# Patient Record
Sex: Female | Born: 1975 | Race: White | Hispanic: No | Marital: Married | Smoking: Former smoker
Health system: Southern US, Community
[De-identification: ages and names within clinical notes are randomized; demographics above are authoritative.]

## PROBLEM LIST (undated history)

## (undated) DIAGNOSIS — M2550 Pain in unspecified joint: Secondary | ICD-10-CM

## (undated) DIAGNOSIS — Z8489 Family history of other specified conditions: Secondary | ICD-10-CM

## (undated) DIAGNOSIS — M25474 Effusion, right foot: Secondary | ICD-10-CM

## (undated) DIAGNOSIS — E781 Pure hyperglyceridemia: Secondary | ICD-10-CM

## (undated) DIAGNOSIS — G8929 Other chronic pain: Secondary | ICD-10-CM

## (undated) DIAGNOSIS — F32A Depression, unspecified: Secondary | ICD-10-CM

## (undated) DIAGNOSIS — G4700 Insomnia, unspecified: Secondary | ICD-10-CM

## (undated) DIAGNOSIS — E538 Deficiency of other specified B group vitamins: Secondary | ICD-10-CM

## (undated) DIAGNOSIS — R7303 Prediabetes: Secondary | ICD-10-CM

## (undated) DIAGNOSIS — M533 Sacrococcygeal disorders, not elsewhere classified: Secondary | ICD-10-CM

## (undated) DIAGNOSIS — K829 Disease of gallbladder, unspecified: Secondary | ICD-10-CM

## (undated) DIAGNOSIS — F419 Anxiety disorder, unspecified: Secondary | ICD-10-CM

## (undated) DIAGNOSIS — E282 Polycystic ovarian syndrome: Secondary | ICD-10-CM

## (undated) DIAGNOSIS — F988 Other specified behavioral and emotional disorders with onset usually occurring in childhood and adolescence: Secondary | ICD-10-CM

## (undated) DIAGNOSIS — R600 Localized edema: Secondary | ICD-10-CM

## (undated) DIAGNOSIS — L709 Acne, unspecified: Secondary | ICD-10-CM

## (undated) DIAGNOSIS — K635 Polyp of colon: Secondary | ICD-10-CM

## (undated) DIAGNOSIS — R002 Palpitations: Secondary | ICD-10-CM

## (undated) DIAGNOSIS — M549 Dorsalgia, unspecified: Secondary | ICD-10-CM

## (undated) DIAGNOSIS — R0602 Shortness of breath: Secondary | ICD-10-CM

## (undated) DIAGNOSIS — G709 Myoneural disorder, unspecified: Secondary | ICD-10-CM

## (undated) DIAGNOSIS — I10 Essential (primary) hypertension: Secondary | ICD-10-CM

## (undated) DIAGNOSIS — M25475 Effusion, left foot: Secondary | ICD-10-CM

## (undated) DIAGNOSIS — M25472 Effusion, left ankle: Secondary | ICD-10-CM

## (undated) DIAGNOSIS — M25471 Effusion, right ankle: Secondary | ICD-10-CM

## (undated) DIAGNOSIS — E559 Vitamin D deficiency, unspecified: Secondary | ICD-10-CM

## (undated) DIAGNOSIS — K5900 Constipation, unspecified: Secondary | ICD-10-CM

## (undated) DIAGNOSIS — K219 Gastro-esophageal reflux disease without esophagitis: Secondary | ICD-10-CM

## (undated) DIAGNOSIS — E785 Hyperlipidemia, unspecified: Secondary | ICD-10-CM

## (undated) DIAGNOSIS — R5383 Other fatigue: Secondary | ICD-10-CM

## (undated) DIAGNOSIS — D649 Anemia, unspecified: Secondary | ICD-10-CM

## (undated) HISTORY — DX: Anxiety disorder, unspecified: F41.9

## (undated) HISTORY — DX: Pain in unspecified joint: M25.50

## (undated) HISTORY — DX: Vitamin D deficiency, unspecified: E55.9

## (undated) HISTORY — DX: Localized edema: R60.0

## (undated) HISTORY — DX: Deficiency of other specified B group vitamins: E53.8

## (undated) HISTORY — DX: Insomnia, unspecified: G47.00

## (undated) HISTORY — PX: CHOLECYSTECTOMY: SHX55

## (undated) HISTORY — PX: EYE SURGERY: SHX253

## (undated) HISTORY — DX: Dorsalgia, unspecified: M54.9

## (undated) HISTORY — DX: Effusion, right foot: M25.474

## (undated) HISTORY — DX: Hyperlipidemia, unspecified: E78.5

## (undated) HISTORY — DX: Effusion, right ankle: M25.471

## (undated) HISTORY — DX: Prediabetes: R73.03

## (undated) HISTORY — DX: Effusion, left ankle: M25.472

## (undated) HISTORY — DX: Constipation, unspecified: K59.00

## (undated) HISTORY — DX: Other fatigue: R53.83

## (undated) HISTORY — DX: Other specified behavioral and emotional disorders with onset usually occurring in childhood and adolescence: F98.8

## (undated) HISTORY — DX: Effusion, left foot: M25.475

## (undated) HISTORY — DX: Polyp of colon: K63.5

## (undated) HISTORY — DX: Pure hyperglyceridemia: E78.1

## (undated) HISTORY — DX: Polycystic ovarian syndrome: E28.2

## (undated) HISTORY — DX: Depression, unspecified: F32.A

## (undated) HISTORY — DX: Shortness of breath: R06.02

## (undated) HISTORY — DX: Palpitations: R00.2

## (undated) HISTORY — PX: APPENDECTOMY: SHX54

## (undated) HISTORY — DX: Disease of gallbladder, unspecified: K82.9

## (undated) HISTORY — PX: DILATION AND CURETTAGE OF UTERUS: SHX78

## (undated) HISTORY — DX: Sacrococcygeal disorders, not elsewhere classified: M53.3

## (undated) HISTORY — DX: Other chronic pain: G89.29

## (undated) HISTORY — DX: Acne, unspecified: L70.9

---

## 2005-05-05 ENCOUNTER — Inpatient Hospital Stay: Payer: Self-pay | Admitting: Vascular Surgery

## 2008-10-31 ENCOUNTER — Ambulatory Visit: Payer: Self-pay | Admitting: Family Medicine

## 2008-10-31 DIAGNOSIS — R002 Palpitations: Secondary | ICD-10-CM | POA: Insufficient documentation

## 2008-11-03 ENCOUNTER — Telehealth (INDEPENDENT_AMBULATORY_CARE_PROVIDER_SITE_OTHER): Payer: Self-pay | Admitting: *Deleted

## 2008-11-11 ENCOUNTER — Ambulatory Visit: Payer: Self-pay | Admitting: Family Medicine

## 2008-11-11 LAB — CONVERTED CEMR LAB
BUN: 12 mg/dL (ref 6–23)
Chloride: 104 meq/L (ref 96–112)
Creatinine, Ser: 0.8 mg/dL (ref 0.4–1.2)
GFR calc Af Amer: 106 mL/min
GFR calc non Af Amer: 88 mL/min
Potassium: 3.9 meq/L (ref 3.5–5.1)

## 2008-12-05 ENCOUNTER — Ambulatory Visit: Payer: Self-pay | Admitting: Cardiology

## 2008-12-05 ENCOUNTER — Encounter: Payer: Self-pay | Admitting: Family Medicine

## 2008-12-05 LAB — CONVERTED CEMR LAB: TSH: 2.832 microintl units/mL (ref 0.350–4.50)

## 2010-06-14 ENCOUNTER — Ambulatory Visit: Payer: Self-pay | Admitting: Unknown Physician Specialty

## 2010-06-15 ENCOUNTER — Ambulatory Visit: Payer: Self-pay | Admitting: Unknown Physician Specialty

## 2010-07-23 ENCOUNTER — Ambulatory Visit: Payer: Self-pay | Admitting: Unknown Physician Specialty

## 2010-07-23 HISTORY — PX: HYSTEROSALPINGOGRAM: SHX6581

## 2011-02-04 ENCOUNTER — Encounter: Payer: Self-pay | Admitting: Family Medicine

## 2011-02-05 ENCOUNTER — Encounter: Payer: Self-pay | Admitting: Family Medicine

## 2011-02-05 ENCOUNTER — Ambulatory Visit (INDEPENDENT_AMBULATORY_CARE_PROVIDER_SITE_OTHER): Payer: Managed Care, Other (non HMO) | Admitting: Family Medicine

## 2011-02-05 DIAGNOSIS — I10 Essential (primary) hypertension: Secondary | ICD-10-CM

## 2011-02-05 DIAGNOSIS — E282 Polycystic ovarian syndrome: Secondary | ICD-10-CM | POA: Insufficient documentation

## 2011-02-05 HISTORY — DX: Essential (primary) hypertension: I10

## 2011-02-05 LAB — CONVERTED CEMR LAB
Bilirubin Urine: NEGATIVE
Ketones, urine, test strip: NEGATIVE
Nitrite: NEGATIVE
Specific Gravity, Urine: 1.015
Urobilinogen, UA: 0.2

## 2011-02-13 NOTE — Assessment & Plan Note (Signed)
Summary: CHECK BP/CLE   [employer]   Vital Signs:  Patient profile:   35 year old female Height:      65 inches Weight:      241.0 pounds BMI:     40.25 Temp:     98.8 degrees F oral Pulse rate:   88 / minute Pulse rhythm:   regular BP sitting:   140 / 100  (left arm) Cuff size:   large  Vitals Entered By: Benny Lennert CMA Duncan Dull) (February 05, 2011 8:49 AM)  History of Present Illness: Chief complaint check bp   HTN, new diagnosis: elevated at dentist...175/90 At endocrinologist's office... 155/90 Foogy feeling in afternoons, no diziness, no headache.  Seeing ENDO for PCOS... blood work yesterday...cheking glucose, A1C... not sure what else. Last cholesterol check few years ago. No recent thyroid test.  Will get labs from ENDO to not repeat testing.    Strong family history of HTN. BMI >30.  No exercise. Diet: eating poorly, fast food.     Problems Prior to Update: 1)  Essential Hypertension, Benign  (ICD-401.1) 2)  Polycystic Ovarian Disease  (ICD-256.4) 3)  Screening For Diabetes Mellitus  (ICD-V77.1) 4)  Palpitations, Recurrent  (ICD-785.1)  Current Medications (verified): 1)  Doxycycline Hyclate 100 Mg Cpep (Doxycycline Hyclate) .... Take One Capsule By Mouth Once Daily 2)  Retina Micro .... Use Two Times A Day  Allergies (verified): No Known Drug Allergies  Past History:  Past medical, surgical, family and social histories (including risk factors) reviewed, and no changes noted (except as noted below).  Past Medical History: Reviewed history from 10/31/2008 and no changes required. Acne  Past Surgical History: Reviewed history from 10/31/2008 and no changes required. Appendectomy, 2005  Family History: Reviewed history from 10/31/2008 and no changes required. Family History Hypertension  CVA, GP  Social History: Reviewed history from 10/31/2008 and no changes required. Marital Status: Married Occupation: in Audiological scientist Current Smoker Drug  use-no  Review of Systems General:  Denies fatigue. CV:  Denies chest pain or discomfort and swelling of feet. Resp:  Denies shortness of breath. GI:  Denies abdominal pain. Psych:  Denies anxiety, depression, and panic attacks.  Physical Exam  General:  obese appearing female inNAD  Eyes:  No corneal or conjunctival inflammation noted. EOMI. Perrla. Funduscopic exam benign, without hemorrhages, exudates or papilledema. Vision grossly normal. Ears:  External ear exam shows no significant lesions or deformities.  Otoscopic examination reveals clear canals, tympanic membranes are intact bilaterally without bulging, retraction, inflammation or discharge. Hearing is grossly normal bilaterally. Nose:  External nasal examination shows no deformity or inflammation. Nasal mucosa are pink and moist without lesions or exudates. Mouth:  MMM Neck:  no carotid bruit or thyromegaly no cervical or supraclavicular lymphadenopathy  Lungs:  Normal respiratory effort, chest expands symmetrically. Lungs are clear to auscultation, no crackles or wheezes. Heart:  Normal rate and regular rhythm. S1 and S2 normal without gallop, murmur, click, rub or other extra sounds. Pulses:  R and L posterior tibial pulses are full and equal bilaterally  Extremities:  no edema Neurologic:  No cranial nerve deficits noted. Station and gait are normal. Plantar reflexes are down-going bilaterally. DTRs are symmetrical throughout. Sensory, motor and coordinative functions appear intact. Skin:  Intact without suspicious lesions or rashes   Impression & Recommendations:  Problem # 1:  ESSENTIAL HYPERTENSION, BENIGN (ICD-401.1) Assessment New Eval for end oragn damage, secondary causes and risk factors. Get recetn labs from ENDO to determine what was done.  Info given on lifestyle change. Keep BP diary at home. Start HCTZ 25 mg daily.  Follow up in 2 weeks.  Her updated medication list for this problem includes:     Hydrochlorothiazide 25 Mg Tabs (Hydrochlorothiazide) .Marland Kitchen... 1 tab by mouth daily  Orders: UA Dipstick w/o Micro (manual) (16109): no abnormality EKG w/ Interpretation (93000) NSR, no ST changes, no Q, no LVH  Problem # 2:  POLYCYSTIC OVARIAN DISEASE (ICD-256.4) Encouraged exercise, weight loss, healthy eating habits.  Get records from ENDO.   Complete Medication List: 1)  Doxycycline Hyclate 100 Mg Cpep (Doxycycline hyclate) .... Take one capsule by mouth once daily 2)  Retina Micro  .... Use two times a day 3)  Hydrochlorothiazide 25 Mg Tabs (Hydrochlorothiazide) .Marland Kitchen.. 1 tab by mouth daily  Patient Instructions: 1)  Work on weight loss.  2)  Start regular exercsie.  3)  Work on low salt, low fat, lab carb  heart healthy diet. 4)   Fresh fruit, veggies, lean protein, increase fiber. 5)  Start HCTZ daily in AMs. 6)   Check BPs at home daily... goal <140/90. 7)   Call if BP not at goal on new med.  8)  Follow up in 2 weeks for BP check.  9)    Prescriptions: HYDROCHLOROTHIAZIDE 25 MG TABS (HYDROCHLOROTHIAZIDE) 1 tab by mouth daily  #30 x 11   Entered and Authorized by:   Kerby Nora MD   Signed by:   Kerby Nora MD on 02/05/2011   Method used:   Electronically to        [facility]       [address]       [place], [place]  [address]       Ph: [phone]       Fax: [phone]   RxID:   [id number]    Orders Added: 1)  UA Dipstick w/o Micro (manual) [81002] 2)  EKG w/ Interpretation [93000] 3)  Est. Patient Level IV [44010]    Current Allergies (reviewed today): No known allergies   Laboratory Results   Urine Tests  Date/Time Received: February 05, 2011 9:41 AM  Date/Time Reported: February 05, 2011 9:41 AM   Routine Urinalysis   Color: yellow Appearance: Clear Glucose: negative   (Normal Range: Negative) Bilirubin: negative   (Normal Range: Negative) Ketone: negative   (Normal Range:  Negative) Spec. Gravity: 1.015   (Normal Range: 1.003-1.035) Blood: negative   (Normal Range: Negative) pH: 5.5   (Normal Range: 5.0-8.0) Protein: negative   (Normal Range: Negative) Urobilinogen: 0.2   (Normal Range: 0-1) Nitrite: negative   (Normal Range: Negative) Leukocyte Esterace: negative   (Normal Range: Negative)

## 2011-02-19 ENCOUNTER — Ambulatory Visit (INDEPENDENT_AMBULATORY_CARE_PROVIDER_SITE_OTHER): Payer: Managed Care, Other (non HMO) | Admitting: Family Medicine

## 2011-02-19 ENCOUNTER — Encounter: Payer: Self-pay | Admitting: Family Medicine

## 2011-02-19 DIAGNOSIS — I10 Essential (primary) hypertension: Secondary | ICD-10-CM

## 2011-02-25 ENCOUNTER — Other Ambulatory Visit: Payer: Self-pay | Admitting: Family Medicine

## 2011-02-25 ENCOUNTER — Encounter (INDEPENDENT_AMBULATORY_CARE_PROVIDER_SITE_OTHER): Payer: Self-pay | Admitting: *Deleted

## 2011-02-25 ENCOUNTER — Other Ambulatory Visit (INDEPENDENT_AMBULATORY_CARE_PROVIDER_SITE_OTHER): Payer: Managed Care, Other (non HMO)

## 2011-02-25 DIAGNOSIS — I10 Essential (primary) hypertension: Secondary | ICD-10-CM

## 2011-02-25 DIAGNOSIS — E785 Hyperlipidemia, unspecified: Secondary | ICD-10-CM

## 2011-02-25 LAB — BASIC METABOLIC PANEL
CO2: 26 mEq/L (ref 19–32)
Calcium: 8.9 mg/dL (ref 8.4–10.5)
Chloride: 100 mEq/L (ref 96–112)
Glucose, Bld: 106 mg/dL — ABNORMAL HIGH (ref 70–99)
Sodium: 134 mEq/L — ABNORMAL LOW (ref 135–145)

## 2011-02-25 LAB — LIPID PANEL
Total CHOL/HDL Ratio: 5
Triglycerides: 387 mg/dL — ABNORMAL HIGH (ref 0.0–149.0)

## 2011-02-25 LAB — HEPATIC FUNCTION PANEL
ALT: 14 U/L (ref 0–35)
AST: 17 U/L (ref 0–37)
Albumin: 3.9 g/dL (ref 3.5–5.2)
Alkaline Phosphatase: 39 U/L (ref 39–117)
Bilirubin, Direct: 0.1 mg/dL (ref 0.0–0.3)
Total Protein: 7 g/dL (ref 6.0–8.3)

## 2011-02-28 NOTE — Assessment & Plan Note (Signed)
Summary: 2WK F/U FOR BP CHECK / LFW   Vital Signs:  Patient profile:   35 year old female Height:      65 inches Weight:      242 pounds BMI:     40.42 Temp:     98.7 degrees F oral Pulse rate:   79 / minute Pulse rhythm:   regular BP sitting:   112 / 82  (left arm) Cuff size:   large  Vitals Entered By: Linde Gillis CMA Duncan Dull) (February 19, 2011 4:17 PM) CC: 2 week follow up blood pressure   History of Present Illness: HTN, improved on HCTZ x 2 weeks... At home BP measurements 130/90... cuff is pretty small.  No SE.  Labs per pt  recieved per request from ENDO...no cholesterol, no thyroid.Marland Kitchen she will fax to [place] when able.  HAs been working on [employer] and lifestyle changes.   Problems Prior to Update: 1)  Essential Hypertension, Benign  (ICD-401.1) 2)  Polycystic Ovarian Disease  (ICD-256.4) 3)  Screening For Diabetes Mellitus  (ICD-V77.1) 4)  Palpitations, Recurrent  (ICD-785.1)  Current Medications (verified): 1)  Doxycycline Hyclate 100 Mg Cpep (Doxycycline Hyclate) .... Take One Capsule By Mouth Once Daily 2)  Retina Micro .... Use Two Times A Day 3)  Hydrochlorothiazide 25 Mg Tabs (Hydrochlorothiazide) .Marland Kitchen.. 1 Tab By Mouth Daily  Allergies (verified): No Known Drug Allergies  Past History:  Past medical, surgical, family and social histories (including risk factors) reviewed, and no changes noted (except as noted below).  Past Medical History: Reviewed history from 10/31/2008 and no changes required. Acne  Past Surgical History: Reviewed history from 10/31/2008 and no changes required. Appendectomy, 2005  Family History: Reviewed history from 10/31/2008 and no changes required. Family History Hypertension  CVA, GP  Social History: Reviewed history from 10/31/2008 and no changes required. 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 CV:  Denies chest pain or  discomfort. Resp:  Denies shortness of breath. GI:  Denies abdominal pain. GU:  Denies dysuria.  Physical Exam  General:  obese appearing female inNAD  Mouth:  MMM Neck:  no carotid bruit or thyromegaly no cervical or supraclavicular lymphadenopathy  Lungs:  Normal respiratory effort, chest expands symmetrically. Lungs are clear to auscultation, no crackles or wheezes. Heart:  Normal rate and regular rhythm. S1 and S2 normal without gallop, murmur, click, rub or other extra sounds. Pulses:  R and L posterior tibial pulses are full and equal bilaterally  Extremities:  no edema Skin:  Intact without suspicious lesions or rashes   Impression & Recommendations:  Problem # 1:  ESSENTIAL HYPERTENSION, BENIGN (ICD-401.1) Encouraged exercise, weight loss, healthy eating habits. Continue curent med. Follow up in 3 months.  Will review labs from [facility] to see if fasting labs need to be done...will go ahead and oreder lipids, CMET,TSh which she does not think was done.  Her updated medication list for this problem includes:    Hydrochlorothiazide 25 Mg Tabs (Hydrochlorothiazide) .Marland Kitchen... 1 tab by mouth daily  Complete Medication List: 1)  Doxycycline Hyclate 100 Mg Cpep (Doxycycline hyclate) .... Take one capsule by mouth once daily 2)  Retina Micro  .... Use two times a day 3)  Hydrochlorothiazide 25 Mg Tabs (Hydrochlorothiazide) .Marland Kitchen.. 1 tab by mouth daily  Patient Instructions: 1)  Fasting lipids, CMET, TSH Dx 401.1 2)  Fax [place] lab report from ENDO.  3)  Please schedule a follow-up  appointment in 3 months HTN   Orders Added: 1)  Est. Patient Level III [16109]    Current Allergies (reviewed today): No known allergies

## 2011-03-05 NOTE — Letter (Signed)
Summary: [facility] Patient Records  [facility] Patient Records   Imported By: Kassie Mends 02/25/2011 08:18:00  _____________________________________________________________________  External Attachment:    Type:   Image     Comment:   External Document

## 2011-05-07 NOTE — Assessment & Plan Note (Signed)
[facility] OFFICE NOTE   Colon, Andrea                      MRN:          [id number]  DATE:12/05/2008                            DOB:          [birth year]    PRIMARY CARE PHYSICIAN:  Juleen China, MD, [facility].   HISTORY OF PRESENT ILLNESS:  This is a 35 year old with a history of  polycystic ovarian syndrome who presents for evaluation of palpitations.  The patient states that she has had a 54-month history of periodic  palpitations.  This typically occurs at night after she has eaten  dinner.  She will feel her heart racing and she feels like the beats are  very strong.  This essentially occurs everyday.  She says it happens as  mentioned more at night, less often during the day.  She is not  currently having the symptoms and she was not having the symptoms when  she saw her primary care physician.  Her EKG was reviewed and shows  normal sinus rhythm and is a normal EKG.  She reports no new stressors  that she can think of in her life.  She does not have dyspnea on  exertion.  She has normal exercise tolerance.  No chest pain.  She has  not had any episodes syncope, lightheadedness, or presyncope associated  with the palpitations.  She has no history of thyroid problems though  she does not remember having a TSH check recently.  She does drinks 2  cups of coffee a day, she says; no other caffeinated beverages.  She  does not use illicit drugs like cocaine or amphetamines.   PAST MEDICAL HISTORY:  1. Polycystic ovarian syndrome.  2. Obesity.  3. The patient had an appendectomy in 2005.   MEDICATIONS:  Doxycycline.   SOCIAL HISTORY:  The patient does drink 2 cups of coffee a day.  She  does not use any illicit drugs.  She smokes a pack a day.  She does not  drink any alcohol.  She is an Film/video editor.  She is married.  She has  no children.   FAMILY HISTORY:  There is a family  history of high blood pressure.  There is a family history of hypertension and stroke.  No family history  of premature coronary artery disease.   LABORATORY DATA:  Most recent labs from November 2009, potassium 3.9,  creatinine 0.8, glucose 99.   EKG was reviewed it showed normal sinus rhythm, it was a normal EKG.   PHYSICAL EXAMINATION:  VITAL SIGNS:  Blood pressure is 128/89, heart  rate is 96 and regular, weight is 235 pounds.  GENERAL:  This is an obese female, in no apparent distress.  NEUROLOGIC:  Alert and oriented x3.  Normal affect.  NECK:  There is no JVD.  There is no thyromegaly, thyroid nodule.  HEENT:  Normal.  CARDIOVASCULAR:  Heart regular.  S1, S2.  No S3.  No S4.  No murmur.  There is no carotid bruits.  There are 2+ posterior tibial pulses  bilaterally.  No peripheral edema.  ABDOMEN:  Obese, soft, nontender.  No hepatosplenomegaly.  Normal bowel  sounds.  LUNGS:  Clear to auscultation bilaterally.  Normal expiratory effort.  SKIN:  Normal.  MUSCULOSKELETAL:  Normal.   ASSESSMENT AND PLAN:  This is a 36 year old with history of polycystic  ovarian syndrome who presents for evaluation of palpitations.  The  patient does report a 31-month history of palpitations, that do tend to  occur at night after dinner.  She feels like her heart is racing.  This  happens essentially every day.  She is not currently having the  symptoms.  Her EKG appears normal.  I think given the fact that she does  have symptoms everyday that we should be able to find any abnormalities  that she has on a 48-hour Holter monitor, so we will set her up for 48-  hour Holter monitor.  I have also encouraged her to cut back on her  caffeine intake.  We will check a TSH today to assess for  hyperthyroidism, and finally, I did tell the patient she needs to cut  back on her smoking.  She is smoking a pack a day now and if she needs  it we would be glad to prescribe her with Chantix, Zyban or a  nicotine  patch.     Marca Ancona, MD  Electronically Signed    DM/MedQ  DD: 12/05/2008  DT: 12/06/2008  Job #: [id number]   cc:   Juleen China, MD

## 2011-05-14 ENCOUNTER — Other Ambulatory Visit: Payer: Self-pay | Admitting: Family Medicine

## 2011-05-14 ENCOUNTER — Other Ambulatory Visit (INDEPENDENT_AMBULATORY_CARE_PROVIDER_SITE_OTHER): Payer: Managed Care, Other (non HMO) | Admitting: Family Medicine

## 2011-05-14 DIAGNOSIS — Z13220 Encounter for screening for lipoid disorders: Secondary | ICD-10-CM

## 2011-05-14 DIAGNOSIS — E781 Pure hyperglyceridemia: Secondary | ICD-10-CM

## 2011-05-14 DIAGNOSIS — R7309 Other abnormal glucose: Secondary | ICD-10-CM

## 2011-05-14 LAB — LIPID PANEL
Cholesterol: 137 mg/dL (ref 0–200)
HDL: 29.2 mg/dL — ABNORMAL LOW
Total CHOL/HDL Ratio: 5
Triglycerides: 213 mg/dL — ABNORMAL HIGH (ref 0.0–149.0)
VLDL: 42.6 mg/dL — ABNORMAL HIGH (ref 0.0–40.0)

## 2011-05-14 LAB — HEMOGLOBIN A1C: Hgb A1c MFr Bld: 5.5 % (ref 4.6–6.5)

## 2011-05-14 LAB — LDL CHOLESTEROL, DIRECT: Direct LDL: 91.8 mg/dL

## 2011-05-14 LAB — GLUCOSE, RANDOM: Glucose, Bld: 107 mg/dL — ABNORMAL HIGH (ref 70–99)

## 2011-05-16 ENCOUNTER — Encounter: Payer: Self-pay | Admitting: Family Medicine

## 2011-05-21 ENCOUNTER — Ambulatory Visit (INDEPENDENT_AMBULATORY_CARE_PROVIDER_SITE_OTHER): Payer: Managed Care, Other (non HMO) | Admitting: Family Medicine

## 2011-05-21 ENCOUNTER — Encounter: Payer: Self-pay | Admitting: Family Medicine

## 2011-05-21 DIAGNOSIS — R7303 Prediabetes: Secondary | ICD-10-CM | POA: Insufficient documentation

## 2011-05-21 DIAGNOSIS — E781 Pure hyperglyceridemia: Secondary | ICD-10-CM | POA: Insufficient documentation

## 2011-05-21 DIAGNOSIS — I10 Essential (primary) hypertension: Secondary | ICD-10-CM

## 2011-05-21 DIAGNOSIS — R7309 Other abnormal glucose: Secondary | ICD-10-CM

## 2011-05-21 NOTE — Patient Instructions (Signed)
Continue great work on lifestyle changes! Keep it up.  Increase fish oil to 3000 mg daily.

## 2011-05-21 NOTE — Assessment & Plan Note (Addendum)
Well controlled. Continue current medication. Continue work on lifestyle changes.

## 2011-05-21 NOTE — Assessment & Plan Note (Signed)
Minimal improvement. Discussed diet changes and continued work on weight loss and exercise.

## 2011-05-21 NOTE — Progress Notes (Signed)
  Subjective:    Patient ID: Andrea Colon, female    DOB: 02-Jul-1976, 35 y.o.   MRN: [id number]  HPI Hypertension:  Well controlled on HCTZ.  Has lost 20 lbs in last 3 month! Working on lifestyle changes.  Using medication without problems or lightheadedness: YES. Chest pain with exertion: None Edema:None Short of breath:None, getting in better shape. Average home BPs: not checking regularly. Other issues:Heart rate up [place].   Has quit smoking 36 days ago, using nicotine patch.  Elevated Cholesterol: On no  medications    Meds, vitals, and allergies reviewed.   PMH and SH reviewed   Review of Systems  Constitutional: Negative for fever and fatigue.  HENT: Negative for ear pain.   Eyes: Negative for pain.  Respiratory: Negative for chest tightness and shortness of breath.   Cardiovascular: Negative for chest pain, palpitations and leg swelling.  Gastrointestinal: Negative for abdominal pain.  Genitourinary: Negative for dysuria.  Musculoskeletal:       Right foot nodule, stiff in AMs, no pain, no redness       Objective:   Physical Exam  Constitutional: Vital signs are normal. She appears well-developed and well-nourished. She is cooperative.  Non-toxic appearance. She does not appear ill. No distress.  HENT:  Head: Normocephalic.  Right Ear: Hearing, tympanic membrane, external ear and ear canal normal. Tympanic membrane is not erythematous, not retracted and not bulging.  Left Ear: Hearing, tympanic membrane, external ear and ear canal normal. Tympanic membrane is not erythematous, not retracted and not bulging.  Nose: No mucosal edema or rhinorrhea. Right sinus exhibits no maxillary sinus tenderness and no frontal sinus tenderness. Left sinus exhibits no maxillary sinus tenderness and no frontal sinus tenderness.  Mouth/Throat: Uvula is midline, oropharynx is clear and moist and mucous membranes are normal.  Eyes: Conjunctivae, EOM and  lids are normal. Pupils are equal, round, and reactive to light. No foreign bodies found.  Neck: Trachea normal and normal range of motion. Neck supple. Carotid bruit is not present. No mass and no thyromegaly present.  Cardiovascular: Normal rate, regular rhythm, S1 normal, S2 normal, normal heart sounds, intact distal pulses and normal pulses.  Exam reveals no gallop and no friction rub.   No murmur heard. Pulmonary/Chest: Effort normal and breath sounds normal. Not tachypneic. No respiratory distress. She has no decreased breath sounds. She has no wheezes. She has no rhonchi. She has no rales.  Abdominal: Soft. Normal appearance and bowel sounds are normal. There is no tenderness.  Neurological: She is alert.  Skin: Skin is warm, dry and intact. No rash noted.  Psychiatric: Her speech is normal and behavior is normal. Judgment and thought content normal. Her mood appears not anxious. Cognition and memory are normal. She does not exhibit a depressed mood.   Right foot, lateral: bony nodule over promximal metatarsal, nonmobile, non-erythematous        Assessment & Plan:

## 2011-05-21 NOTE — Assessment & Plan Note (Signed)
Improved. Increase fish oil and continue lifestyle change.

## 2011-06-11 ENCOUNTER — Encounter: Payer: Self-pay | Admitting: Cardiovascular Disease

## 2011-10-10 ENCOUNTER — Encounter: Payer: Self-pay | Admitting: Family Medicine

## 2011-10-10 ENCOUNTER — Ambulatory Visit (INDEPENDENT_AMBULATORY_CARE_PROVIDER_SITE_OTHER): Payer: Managed Care, Other (non HMO) | Admitting: Family Medicine

## 2011-10-10 DIAGNOSIS — I10 Essential (primary) hypertension: Secondary | ICD-10-CM

## 2011-10-10 DIAGNOSIS — R42 Dizziness and giddiness: Secondary | ICD-10-CM

## 2011-10-10 DIAGNOSIS — H539 Unspecified visual disturbance: Secondary | ICD-10-CM | POA: Insufficient documentation

## 2011-10-10 MED ORDER — FLUTICASONE PROPIONATE 50 MCG/ACT NA SUSP: 2.0000 | Freq: Every day | NASAL | Status: DC

## 2011-10-10 NOTE — Patient Instructions (Addendum)
Start nasal spray 2 sprays once daily  for ear fluid and nasal congestion. Stop HCTZ for now. Start home inner ear exercsies. Return in 2 weeks if symptoms not continuing to improve.

## 2011-10-10 NOTE — Assessment & Plan Note (Signed)
Well controlled 

## 2011-10-10 NOTE — Assessment & Plan Note (Signed)
May be related to inner ear issues, but if remain or other unusual neuro changes develop.. Consider ? MS.
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
## 2011-10-10 NOTE — Assessment & Plan Note (Signed)
Most likely inner ear issues per description.  We will try to treat congestion in ears with nasal steroid spray. Start home innner ear exercsies, info given.  Hold HCTZ for now as an contribute to vertigo (BP remained well controlled off this med). Follow up in 2 weeks.

## 2011-10-15 ENCOUNTER — Ambulatory Visit (INDEPENDENT_AMBULATORY_CARE_PROVIDER_SITE_OTHER): Payer: Managed Care, Other (non HMO) | Admitting: Family Medicine

## 2011-10-15 ENCOUNTER — Encounter: Payer: Self-pay | Admitting: Family Medicine

## 2011-10-15 VITALS — BP 120/70 | HR 89 | Temp 98.8°F | Ht 64.0 in | Wt 196.8 lb

## 2011-10-15 DIAGNOSIS — R5381 Other malaise: Secondary | ICD-10-CM

## 2011-10-15 DIAGNOSIS — R42 Dizziness and giddiness: Secondary | ICD-10-CM

## 2011-10-15 DIAGNOSIS — R5383 Other fatigue: Secondary | ICD-10-CM

## 2011-10-15 LAB — CBC WITH DIFFERENTIAL/PLATELET
Eosinophils Relative: 0 % (ref 0.0–5.0)
HCT: 38.4 % (ref 36.0–46.0)
Hemoglobin: 13 g/dL (ref 12.0–15.0)
Lymphs Abs: 1.8 10*3/uL (ref 0.7–4.0)
Monocytes Relative: 3.9 % (ref 3.0–12.0)
Neutro Abs: 7.1 10*3/uL (ref 1.4–7.7)
RDW: 13.9 % (ref 11.5–14.6)
WBC: 9.2 10*3/uL (ref 4.5–10.5)

## 2011-10-15 LAB — BASIC METABOLIC PANEL
CO2: 28 mEq/L (ref 19–32)
Calcium: 9.2 mg/dL (ref 8.4–10.5)
Creatinine, Ser: 0.7 mg/dL (ref 0.4–1.2)
Glucose, Bld: 89 mg/dL (ref 70–99)

## 2011-10-15 LAB — HEPATIC FUNCTION PANEL
Albumin: 4.2 g/dL (ref 3.5–5.2)
Total Protein: 7.8 g/dL (ref 6.0–8.3)

## 2011-10-15 MED ORDER — DIAZEPAM 2 MG PO TABS: 2.0000 mg | ORAL_TABLET | Freq: Three times a day (TID) | ORAL | Status: AC | PRN

## 2011-10-15 MED ORDER — ONDANSETRON 4 MG PO TBDP: 4.0000 mg | ORAL_TABLET | Freq: Three times a day (TID) | ORAL | Status: AC | PRN
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
## 2011-10-26 ENCOUNTER — Emergency Department: Payer: Self-pay | Admitting: Emergency Medicine

## 2011-10-28 ENCOUNTER — Telehealth: Payer: Self-pay | Admitting: Family Medicine

## 2011-10-28 NOTE — Telephone Encounter (Signed)
[facility]-A-Nurse report.  Date of Call: 10/26/11 Time: 11:37 a.m.  Call Description Report:  Velta Addison, calling regarding numbness and tingling. PCP is Royston Cowper number is [phone]. Dulce's HR is > 120, anxious, c/o numbness and tingling in legs and feet. C/o dizziness. Onset 2 weeks ago. Symptoms started after starting Flonase. Worsening today. Advised ED.

## 2011-11-08 ENCOUNTER — Ambulatory Visit (INDEPENDENT_AMBULATORY_CARE_PROVIDER_SITE_OTHER): Payer: Managed Care, Other (non HMO) | Admitting: Family Medicine

## 2011-11-08 ENCOUNTER — Encounter: Payer: Self-pay | Admitting: Family Medicine

## 2011-11-08 VITALS — BP 120/86 | HR 84 | Temp 98.3°F | Ht 64.0 in | Wt 195.8 lb

## 2011-11-08 DIAGNOSIS — R209 Unspecified disturbances of skin sensation: Secondary | ICD-10-CM

## 2011-11-08 DIAGNOSIS — H539 Unspecified visual disturbance: Secondary | ICD-10-CM

## 2011-11-08 DIAGNOSIS — Z13220 Encounter for screening for lipoid disorders: Secondary | ICD-10-CM

## 2011-11-08 DIAGNOSIS — R450 Nervousness: Secondary | ICD-10-CM

## 2011-11-08 DIAGNOSIS — R202 Paresthesia of skin: Secondary | ICD-10-CM

## 2011-11-08 DIAGNOSIS — R200 Anesthesia of skin: Secondary | ICD-10-CM | POA: Insufficient documentation

## 2011-11-08 MED ORDER — CLONAZEPAM 1 MG PO TABS: 1.0000 mg | ORAL_TABLET | Freq: Three times a day (TID) | ORAL | Status: DC | PRN

## 2011-11-08 NOTE — Assessment & Plan Note (Signed)
UNclear cause.Marland Kitchen Possibly related to sinus issues.

## 2011-11-08 NOTE — Progress Notes (Signed)
Subjective:    Patient ID: Andrea Colon, female    DOB: 12-29-1975, 35 y.o.   MRN: [id number]  HPI  35 year old female presents with anxiety , jittery, tingling in legs, diarrhea for past 2 weeks. Feels like motor running inside of her (cannot see her shaking). She is concerned that it may be due to flonase (started after 5 doses), but she had stopped this 2 weeks ago.  Seen in ER 2 weeks ago... CMET, cbc, TSH nml. Dx with panic attack. Given clonazepam which helps her sleep at night.  Clonazepam helped with vibrating in core.   Vertigo is improved. Continuing to have unusual blurriness/ cloudy in  Right eye. Has seen two eye MD.  Head CT in ER showed... No acute changes, sinus cyst. Seeing ENT for sinus cyst seen on CT for antihistamine spray.Marland Kitchen Has not used yet. Using nasal rinsing.   She states she feels she is not having any stress... She is laughing somewhat inappropriately and states she has been crying  a lot recently.  She doesn't feel like this is her mood..  She does acknowledge that each day things feel better.  BP and pulse not elevated.   Reviewed ER notes.    Review of Systems  Constitutional: Negative for fever and fatigue.  HENT: Negative for ear pain.   Eyes: Negative for pain.  Respiratory: Negative for chest tightness and shortness of breath.   Cardiovascular: Negative for chest pain, palpitations and leg swelling.  Gastrointestinal: Negative for abdominal pain.  Genitourinary: Negative for dysuria.  Neurological: Positive for numbness. Negative for dizziness and syncope.       Objective:   Physical Exam  Constitutional: Vital signs are normal. She appears well-developed and well-nourished. She is cooperative.  Non-toxic appearance. She does not appear ill. No distress.  HENT:  Head: Normocephalic.  Right Ear: Hearing, tympanic membrane, external ear and ear canal normal. Tympanic membrane is not erythematous, not retracted and not bulging.  Left  Ear: Hearing, tympanic membrane, external ear and ear canal normal. Tympanic membrane is not erythematous, not retracted and not bulging.  Nose: No mucosal edema or rhinorrhea. Right sinus exhibits no maxillary sinus tenderness and no frontal sinus tenderness. Left sinus exhibits no maxillary sinus tenderness and no frontal sinus tenderness.  Mouth/Throat: Uvula is midline, oropharynx is clear and moist and mucous membranes are normal.  Eyes: Conjunctivae, EOM and lids are normal. Pupils are equal, round, and reactive to light. No foreign bodies found.  Neck: Trachea normal and normal range of motion. Neck supple. Carotid bruit is not present. No mass and no thyromegaly present.  Cardiovascular: Normal rate, regular rhythm, S1 normal, S2 normal, normal heart sounds, intact distal pulses and normal pulses.  Exam reveals no gallop and no friction rub.   No murmur heard. Pulmonary/Chest: Effort normal and breath sounds normal. Not tachypneic. No respiratory distress. She has no decreased breath sounds. She has no wheezes. She has no rhonchi. She has no rales.  Abdominal: Soft. Normal appearance and bowel sounds are normal. There is no tenderness.  Neurological: She is alert.  Skin: Skin is warm, dry and intact. No rash noted.  Psychiatric: Judgment and thought content normal. Her mood appears anxious. Her affect is labile. Her speech is rapid and/or pressured. She is agitated. Cognition and memory are normal. She does not exhibit a depressed mood. She expresses no suicidal ideation. She expresses no suicidal plans.          Assessment & Plan:

## 2011-11-08 NOTE — Patient Instructions (Signed)
Increase clonazepam to every 8 hours as need for jittery feelings.  If feeling better...decrease use. Keep appt in 11/30, Keep lab appt but we will cancel labs other than lipids given done at  ER. Stay off flonase.

## 2011-11-08 NOTE — Assessment & Plan Note (Signed)
Appears very anxious, but pt feels liek this is not mood alone.  Recent labs nml, nml TSH. ? Related to flonase.Andrea Colon Pt feels temporaly related.  No clear sign of adrenal issues with electrolytes of BP. Pulse nml. Will have her use clonazepam temporarily more frequently...give any med SE time to wear of.  Follow up in 1 week. Consider if not better at that time.. Adrenal eval

## 2011-11-13 ENCOUNTER — Other Ambulatory Visit (INDEPENDENT_AMBULATORY_CARE_PROVIDER_SITE_OTHER): Payer: Managed Care, Other (non HMO)

## 2011-11-13 DIAGNOSIS — Z13220 Encounter for screening for lipoid disorders: Secondary | ICD-10-CM

## 2011-11-13 LAB — LIPID PANEL
Cholesterol: 153 mg/dL (ref 0–200)
Total CHOL/HDL Ratio: 4
Triglycerides: 139 mg/dL (ref 0.0–149.0)

## 2011-11-22 ENCOUNTER — Ambulatory Visit (INDEPENDENT_AMBULATORY_CARE_PROVIDER_SITE_OTHER): Payer: Managed Care, Other (non HMO) | Admitting: Family Medicine

## 2011-11-22 ENCOUNTER — Encounter: Payer: Self-pay | Admitting: Family Medicine

## 2011-11-22 DIAGNOSIS — R7303 Prediabetes: Secondary | ICD-10-CM

## 2011-11-22 DIAGNOSIS — I10 Essential (primary) hypertension: Secondary | ICD-10-CM

## 2011-11-22 DIAGNOSIS — R200 Anesthesia of skin: Secondary | ICD-10-CM

## 2011-11-22 DIAGNOSIS — F330 Major depressive disorder, recurrent, mild: Secondary | ICD-10-CM | POA: Insufficient documentation

## 2011-11-22 DIAGNOSIS — F32A Depression, unspecified: Secondary | ICD-10-CM

## 2011-11-22 DIAGNOSIS — R7309 Other abnormal glucose: Secondary | ICD-10-CM

## 2011-11-22 DIAGNOSIS — R519 Headache, unspecified: Secondary | ICD-10-CM | POA: Insufficient documentation

## 2011-11-22 DIAGNOSIS — E781 Pure hyperglyceridemia: Secondary | ICD-10-CM

## 2011-11-22 DIAGNOSIS — F419 Anxiety disorder, unspecified: Secondary | ICD-10-CM

## 2011-11-22 DIAGNOSIS — F341 Dysthymic disorder: Secondary | ICD-10-CM

## 2011-11-22 DIAGNOSIS — R209 Unspecified disturbances of skin sensation: Secondary | ICD-10-CM

## 2011-11-22 DIAGNOSIS — R51 Headache: Secondary | ICD-10-CM

## 2011-11-22 MED ORDER — VENLAFAXINE HCL ER 37.5 MG PO CP24: ORAL_CAPSULE | ORAL | Status: DC

## 2011-11-22 MED ORDER — DICLOFENAC SODIUM 75 MG PO TBEC: 75.0000 mg | DELAYED_RELEASE_TABLET | Freq: Two times a day (BID) | ORAL | Status: DC

## 2011-11-22 NOTE — Patient Instructions (Signed)
Use diclofenac twice daily with food for right eye headache.  Start effexor at 37.5 mg daily, if tolerating increase to maintanance dose 75 mg daily.  Stop at front to set up MRI brian for further eval.  Follow up in 4 weeks 30 min OV if available.

## 2011-11-22 NOTE — Assessment & Plan Note (Signed)
Previously well controlled on HCTZ. Likely elevated today given upset.  Follow.

## 2011-11-22 NOTE — Assessment & Plan Note (Signed)
Given neg ENT work up.. Progressive nature, some neuro symptoms that are unusual and 3 month duration.  Will eval with MRI brain. Consider neuro referral to headache clinic to consider migraine variant if MRI negative.

## 2011-11-22 NOTE — Assessment & Plan Note (Signed)
Resolved

## 2011-11-22 NOTE — Assessment & Plan Note (Signed)
I believe this is key to her current symtpoms. May be causing a lot of her headache pain as well.  Discussed options in detail. Start effexor 37.5 mg daily, increase to 75 as tolerated. Follow up in 1 month. Okay to continue klonopin as needed until med starts working.

## 2011-11-22 NOTE — Progress Notes (Signed)
Subjective:    Patient ID: Andrea Colon, female    DOB: 1976-07-23, 35 y.o.   MRN: [id number]  HPI  35 year old female here for 6 month follow up of chronic issues as well as 1 week follow up of jitteriness, numbness in legs.  She reports  she feels she is no less agitated, but more depressed overall. She feels she is crying constantly. Has been taking klonipin daily and for sleep.. Still waking up at night.  No pleasure from thisng she used to, no motivation.  Husband going out of town... She is terrified of being alone with current health issues.  For pressure behind right eye, ? Right blurred vision x 3 months:  She reports this is worsening, now constant. ENT sent her for allergy testing: negative., Head CT at ER was nml at ER.   She is still very bothered by vision changes since September...  See previous notes for 2-3 months ago. She has realized... no true  blurred vision, more like constant pressure behind right eye. Tried patanase . No improvement with flonase. No further neck pain (but some neck tightness) and no current vertigo.  Tingling in legs intermittantly. No weakness, no slurred speech.  Saw Chiropractor: nml cervical spine X-rays.   Has gone to eye MD 2 times.. No increase pressure, nml vision test etc.  Aleve helps with pressure feeling temporarily.    High triglycerides: Resolved. Prediabetes: resolved  Hypertension:   Nml last on HCTZ. Upset, crying today.  Using medication without problems or lightheadedness:  Chest pain with exertion: None Edema:None Short of breath:None Average home BPs: Not checking. Other issues:    Review of Systems  All other systems reviewed and are negative.       Objective:   Physical Exam  Constitutional: She is oriented to person, place, and time. Vital signs are normal. She appears well-developed and well-nourished. She is cooperative.  Non-toxic appearance. She does not appear ill. No distress.       Tearful,  shaking.  HENT:  Head: Normocephalic.  Right Ear: Hearing, tympanic membrane, external ear and ear canal normal. Tympanic membrane is not erythematous, not retracted and not bulging.  Left Ear: Hearing, tympanic membrane, external ear and ear canal normal. Tympanic membrane is not erythematous, not retracted and not bulging.  Nose: No mucosal edema or rhinorrhea. Right sinus exhibits no maxillary sinus tenderness and no frontal sinus tenderness. Left sinus exhibits no maxillary sinus tenderness and no frontal sinus tenderness.  Mouth/Throat: Uvula is midline, oropharynx is clear and moist and mucous membranes are normal.  Eyes: Conjunctivae, EOM and lids are normal. Pupils are equal, round, and reactive to light. No foreign bodies found.  Neck: Trachea normal and normal range of motion. Neck supple. Carotid bruit is not present. No mass and no thyromegaly present.  Cardiovascular: Normal rate, regular rhythm, S1 normal, S2 normal, normal heart sounds, intact distal pulses and normal pulses.  Exam reveals no gallop and no friction rub.   No murmur heard. Pulmonary/Chest: Effort normal and breath sounds normal. Not tachypneic. No respiratory distress. She has no decreased breath sounds. She has no wheezes. She has no rhonchi. She has no rales.  Abdominal: Soft. Normal appearance and bowel sounds are normal. There is no tenderness.  Neurological: She is alert and oriented to person, place, and time. She has normal reflexes. She displays normal reflexes. No cranial nerve deficit. She exhibits normal muscle tone. Coordination and gait normal.  nml cerebellar exam.   Skin: Skin is warm, dry and intact. No rash noted.  Psychiatric: Her speech is normal and behavior is normal. Judgment and thought content normal. Her mood appears not anxious. Cognition and memory are normal. She does not exhibit a depressed mood.          Assessment & Plan:

## 2011-11-23 ENCOUNTER — Ambulatory Visit: Payer: Self-pay | Admitting: Family Medicine

## 2011-11-24 ENCOUNTER — Inpatient Hospital Stay: Payer: Self-pay | Admitting: Psychiatry

## 2011-11-25 ENCOUNTER — Encounter: Payer: Self-pay | Admitting: Family Medicine

## 2011-11-26 ENCOUNTER — Telehealth: Payer: Self-pay | Admitting: Internal Medicine

## 2011-12-16 NOTE — Telephone Encounter (Signed)
error 

## 2011-12-19 ENCOUNTER — Ambulatory Visit (INDEPENDENT_AMBULATORY_CARE_PROVIDER_SITE_OTHER): Payer: Managed Care, Other (non HMO) | Admitting: Family Medicine

## 2011-12-19 ENCOUNTER — Ambulatory Visit: Payer: Self-pay | Admitting: Neurology

## 2011-12-19 ENCOUNTER — Encounter: Payer: Self-pay | Admitting: Family Medicine

## 2011-12-19 DIAGNOSIS — F32A Depression, unspecified: Secondary | ICD-10-CM

## 2011-12-19 DIAGNOSIS — F341 Dysthymic disorder: Secondary | ICD-10-CM

## 2011-12-19 DIAGNOSIS — H539 Unspecified visual disturbance: Secondary | ICD-10-CM

## 2011-12-19 DIAGNOSIS — R51 Headache: Secondary | ICD-10-CM

## 2011-12-19 DIAGNOSIS — F419 Anxiety disorder, unspecified: Secondary | ICD-10-CM

## 2011-12-19 DIAGNOSIS — I10 Essential (primary) hypertension: Secondary | ICD-10-CM

## 2011-12-19 NOTE — Assessment & Plan Note (Signed)
In midst of MS eval with [facility]. Notes reviewed in detail.

## 2011-12-19 NOTE — Assessment & Plan Note (Signed)
Improving.

## 2011-12-19 NOTE — Assessment & Plan Note (Signed)
Improved control on zoloft and klonopin.

## 2011-12-19 NOTE — Progress Notes (Signed)
  Subjective:    Patient ID: Andrea Colon, female    DOB: [birth year], 35 y.o.   MRN: [id number]  HPI  Anxiety,depression follow up: Since last OV Ms. Gebel had been admitted to [facility] for a short stay in Dec... Sent due to concern that she may hurt herself.  She has been placed on zoloft 75 mg daily and is using klonopin once at night. She is seeing a psychiatrist: Dr. Delaney Meigs... Saw in hospital and had follow up a few weeks ago. She has been told to increase to 100 mg this week. She reports significant improvement. No crying, no SI.  She is still having right eye pressure ( not as constant), has now noted black dot in left eye. No numbness since course with prednisone.  Given unusual neuro symptoms (possibly due to anxiety) she is seeing a neurologist (at [facility]) and is in midst of MS evaluation. MRI scheduled today spine and brain.  HTN well controlled today. Had lumbar puncture last week. Labs are normal but vit D and B12 low... On supplementation.    Review of Systems  Constitutional: Negative for fever and fatigue.  HENT: Negative for ear pain.   Eyes: Negative for pain.  Respiratory: Negative for chest tightness and shortness of breath.   Cardiovascular: Negative for chest pain, palpitations and leg swelling.  Gastrointestinal: Negative for abdominal pain.  Genitourinary: Negative for dysuria.       Objective:   Physical Exam  Constitutional: She is oriented to person, place, and time. Vital signs are normal. She appears well-developed and well-nourished. She is cooperative.  Non-toxic appearance. She does not appear ill. No distress.  HENT:  Head: Normocephalic.  Right Ear: Hearing, tympanic membrane, external ear and ear canal normal. Tympanic membrane is not erythematous, not retracted and not bulging.  Left Ear: Hearing, tympanic membrane, external ear and ear canal normal. Tympanic membrane is not erythematous, not retracted and not bulging.    Nose: No mucosal edema or rhinorrhea. Right sinus exhibits no maxillary sinus tenderness and no frontal sinus tenderness. Left sinus exhibits no maxillary sinus tenderness and no frontal sinus tenderness.  Mouth/Throat: Uvula is midline, oropharynx is clear and moist and mucous membranes are normal.  Eyes: Conjunctivae, EOM and lids are normal. Pupils are equal, round, and reactive to light. No foreign bodies found.  Neck: Trachea normal and normal range of motion. Neck supple. Carotid bruit is not present. No mass and no thyromegaly present.  Cardiovascular: Normal rate, regular rhythm, S1 normal, S2 normal, normal heart sounds, intact distal pulses and normal pulses.  Exam reveals no gallop and no friction rub.   No murmur heard. Pulmonary/Chest: Effort normal and breath sounds normal. Not tachypneic. No respiratory distress. She has no decreased breath sounds. She has no wheezes. She has no rhonchi. She has no rales.  Abdominal: Soft. Normal appearance and bowel sounds are normal. There is no tenderness.  Neurological: She is alert and oriented to person, place, and time. She has normal reflexes. No cranial nerve deficit. Coordination normal.  Skin: Skin is warm, dry and intact. No rash noted.  Psychiatric: Her speech is normal and behavior is normal. Judgment and thought content normal. Her mood appears anxious. Cognition and memory are normal. She does not exhibit a depressed mood.       Mildly anxious          Assessment & Plan:

## 2011-12-19 NOTE — Assessment & Plan Note (Addendum)
Well controlled on NO med with improvement in mood.

## 2011-12-25 ENCOUNTER — Telehealth: Payer: Self-pay | Admitting: Internal Medicine

## 2011-12-25 ENCOUNTER — Other Ambulatory Visit: Payer: Self-pay | Admitting: Family Medicine

## 2011-12-25 NOTE — Telephone Encounter (Signed)
Patient advised.

## 2011-12-25 NOTE — Telephone Encounter (Signed)
Patient called and stated she was given a prescription for Gabapentin by her neurologist and wanted to make sure it was all right to take with her medication she is taking.  She wanted to make sure there was no reaction.

## 2011-12-25 NOTE — Telephone Encounter (Signed)
i have that she is taking zoloft and klonopin - no interactions  Very safe medication that most people do fine on

## 2012-01-16 ENCOUNTER — Encounter: Payer: Self-pay | Admitting: Family Medicine

## 2012-01-16 ENCOUNTER — Ambulatory Visit (INDEPENDENT_AMBULATORY_CARE_PROVIDER_SITE_OTHER): Payer: Managed Care, Other (non HMO) | Admitting: Family Medicine

## 2012-01-16 VITALS — BP 130/86 | HR 85 | Temp 98.1°F | Wt 203.0 lb

## 2012-01-16 DIAGNOSIS — J029 Acute pharyngitis, unspecified: Secondary | ICD-10-CM

## 2012-01-16 DIAGNOSIS — J069 Acute upper respiratory infection, unspecified: Secondary | ICD-10-CM | POA: Insufficient documentation

## 2012-01-16 NOTE — Progress Notes (Signed)
duration of symptoms: a few days rhinorrhea:yes Congestion: yes ear pain: R ear pain, minimal sore throat:yes Cough: minimal Myalgias: no No sig fevers known but was hot then sweaty last night.  Taking tylenol.  Some facial pressure.   No wheeze.  Former smoker.  Mult sick contacts.   ROS: See HPI.  Otherwise negative.    Meds, vitals, and allergies reviewed.   GEN: nad, alert and oriented HEENT: mucous membranes moist, TM w/o erythema, nasal epithelium injected, OP with cobblestoning, poor TM movement on valsalva NECK: supple w/o LA CV: rrr. PULM: ctab, no inc wob ABD: soft, +bs EXT: no edema

## 2012-01-16 NOTE — Assessment & Plan Note (Signed)
Likely viral.  RST neg.  abx likely note useful.  F/u prn.  Supportive tx.  ddx d/w pt.

## 2012-01-16 NOTE — Patient Instructions (Signed)
Drink plenty of fluids, take tylenol as needed, and gargle with warm salt water for your throat.  This should gradually improve.  Take care.  Let us know if you have other concerns.   I would get a flu shot each fall.

## 2012-01-20 ENCOUNTER — Encounter: Payer: Self-pay | Admitting: Family Medicine

## 2012-01-20 ENCOUNTER — Ambulatory Visit (INDEPENDENT_AMBULATORY_CARE_PROVIDER_SITE_OTHER): Payer: Managed Care, Other (non HMO) | Admitting: Family Medicine

## 2012-01-20 VITALS — BP 112/84 | HR 85 | Temp 98.5°F | Wt 200.0 lb

## 2012-01-20 DIAGNOSIS — J029 Acute pharyngitis, unspecified: Secondary | ICD-10-CM

## 2012-01-20 DIAGNOSIS — J069 Acute upper respiratory infection, unspecified: Secondary | ICD-10-CM

## 2012-01-20 MED ORDER — LIDOCAINE VISCOUS 2 % MT SOLN: 10.0000 mL | OROMUCOSAL | Status: AC | PRN

## 2012-01-20 NOTE — Patient Instructions (Signed)
Take the lidocaine for your throat, try to rest your voice and we'll contact you with your lab report. Call me Wednesday if not better.  Take care.

## 2012-01-20 NOTE — Progress Notes (Signed)
Prev note reviewed.   Prev HPI:  duration of symptoms: a few days  rhinorrhea:yes  Congestion: yes  ear pain: R ear pain, minimal  sore throat:yes  Cough: minimal  Myalgias: no  No sig fevers known but was hot then sweaty last night.  Taking tylenol.  Some facial pressure.  No wheeze.  Former smoker.  Mult sick contacts.   In interval, lost voice.  ST increased. Inc in cough with phlegm.  Dec in rhinorrhea.  ST is the main issue for her.  Salt water doesn't help.  Most fluids cause burning in throat. No fevers known.  Taking advil for throat.   Sx going for about 1 week.    Meds, vitals, and allergies reviewed.   ROS: See HPI.  Otherwise, noncontributory.  GEN: nad, alert and oriented HEENT: mucous membranes moist, tm w/o erythema, nasal exam w/o erythema but L nostril is stuffy, clear discharge noted,  OP with cobblestoning but no exudates, sinuses not ttp x4 NECK: supple w/o LA, no stridor CV: rrr.   PULM: ctab, no inc wob EXT: no edema SKIN: no acute rash Strep cx pending.   Prev RST neg.

## 2012-01-20 NOTE — Assessment & Plan Note (Signed)
This is still likely viral, esp with loss of voice.  Nontoxic.  Check strep cx and use lidocaine prn in meantime, caution about chewing after using med.  She understood, call back if not improved.  ddx d/w along with rationale not to use abx, given low likelihood of benefit.  Okay for outpatient f/u.

## 2012-01-22 ENCOUNTER — Telehealth: Payer: Self-pay | Admitting: Family Medicine

## 2012-01-22 MED ORDER — AZITHROMYCIN 250 MG PO TABS: ORAL_TABLET | ORAL | Status: AC

## 2012-01-22 NOTE — Telephone Encounter (Signed)
We don't have a valid call back number.  I would start zmax if she isn't better.  Please call the pharmacy; I sent the rx, but see if the they have other contact info.  Thanks.  Please correct the numbers under demographics.  Thanks.

## 2012-01-22 NOTE — Telephone Encounter (Signed)
Patient was seen on Monday.  She was told to call you back if she wasn't feeling better and you would call her.  She's not feeling better.  Please call.

## 2012-01-22 NOTE — Telephone Encounter (Signed)
Spoke with patient and notified her of Rx. Numbers were correct in chart.

## 2012-01-23 LAB — STREP A CULTURE, THROAT

## 2012-04-05 ENCOUNTER — Emergency Department: Payer: Self-pay | Admitting: Emergency Medicine

## 2012-06-16 ENCOUNTER — Encounter: Payer: Self-pay | Admitting: Family Medicine

## 2012-06-16 ENCOUNTER — Ambulatory Visit (INDEPENDENT_AMBULATORY_CARE_PROVIDER_SITE_OTHER): Payer: Managed Care, Other (non HMO) | Admitting: Family Medicine

## 2012-06-16 VITALS — BP 120/70 | HR 111 | Temp 98.6°F | Ht 64.0 in | Wt 207.5 lb

## 2012-06-16 DIAGNOSIS — I10 Essential (primary) hypertension: Secondary | ICD-10-CM

## 2012-06-16 DIAGNOSIS — F341 Dysthymic disorder: Secondary | ICD-10-CM

## 2012-06-16 DIAGNOSIS — M546 Pain in thoracic spine: Secondary | ICD-10-CM

## 2012-06-16 DIAGNOSIS — H539 Unspecified visual disturbance: Secondary | ICD-10-CM

## 2012-06-16 DIAGNOSIS — F32A Depression, unspecified: Secondary | ICD-10-CM

## 2012-06-16 DIAGNOSIS — F419 Anxiety disorder, unspecified: Secondary | ICD-10-CM

## 2012-06-16 NOTE — Progress Notes (Signed)
Subjective:    Patient ID: Andrea Colon, female    DOB: 06-01-1976, 36 y.o.   MRN: [id number]  HPI  36 year old female presents for 6 moth follow up.  Anxiety,depression :Admitted to [facility] for a short stay in Dec... Sent due to concern that she may hurt herself.  She has been placed on zoloft 75 mg daily, no longer using a klonopin. She is seeing a psychiatrist: Dr. Delaney Meigs... Saw in hospital and had follow up a few weeks ago. She has been told to increase to 100. She has a 6 month appt cycle now. She reports significant improvement. No crying, no SI.  She wants to stop zoloft as well.  She is now [redacted] weeks pregnant.  Prism in her eye glasses helped with symtpoms of right eye pressure, has now noted black dot in left eye No numbness since course with prednisone.  Given unusual neuro symptoms, had  neurologist (at [facility]) eval for MS evaluation.  Neg lumbar puncture as well. MRI  spine and brain negative.  HTN well controlled today. On no med.  Labs are normal but vit D and B12 low... On supplementation.     Review of Systems  Constitutional: Positive for fatigue. Negative for fever.  HENT: Negative for congestion.   Eyes: Negative for pain.  Respiratory: Negative for cough and shortness of breath.   Gastrointestinal:       Morning sickness  Skin:       Behind shoulder blade.. Feels area of burning before eating, but goes away with burp... Comes and goes.  Neurological: Positive for headaches.       Objective:   Physical Exam  Constitutional: Vital signs are normal. She appears well-developed and well-nourished. She is cooperative.  Non-toxic appearance. She does not appear ill. No distress.  HENT:  Head: Normocephalic.  Right Ear: Hearing, tympanic membrane, external ear and ear canal normal. Tympanic membrane is not erythematous, not retracted and not bulging.  Left Ear: Hearing, tympanic membrane, external ear and ear canal normal. Tympanic  membrane is not erythematous, not retracted and not bulging.  Nose: No mucosal edema or rhinorrhea. Right sinus exhibits no maxillary sinus tenderness and no frontal sinus tenderness. Left sinus exhibits no maxillary sinus tenderness and no frontal sinus tenderness.  Mouth/Throat: Uvula is midline, oropharynx is clear and moist and mucous membranes are normal.  Eyes: Conjunctivae, EOM and lids are normal. Pupils are equal, round, and reactive to light. No foreign bodies found.  Neck: Trachea normal and normal range of motion. Neck supple. Carotid bruit is not present. No mass and no thyromegaly present.  Cardiovascular: Normal rate, regular rhythm, S1 normal, S2 normal, normal heart sounds, intact distal pulses and normal pulses.  Exam reveals no gallop and no friction rub.   No murmur heard. Pulmonary/Chest: Effort normal and breath sounds normal. Not tachypneic. No respiratory distress. She has no decreased breath sounds. She has no wheezes. She has no rhonchi. She has no rales.  Abdominal: Soft. Normal appearance and bowel sounds are normal. There is no tenderness.  Musculoskeletal:       No vertebral ttp.. Points to burning area in right shoulder blade  Neurological: She is alert.  Skin: Skin is warm, dry and intact. No rash noted.  Psychiatric: Her speech is normal and behavior is normal. Judgment and thought content normal. Her mood appears not anxious. Cognition and memory are normal. She does not exhibit a depressed mood.  Assessment & Plan:

## 2012-06-16 NOTE — Assessment & Plan Note (Signed)
Improved on zoloft.. Seeing psych. Considering weaning off given pregnancy.

## 2012-06-16 NOTE — Assessment & Plan Note (Signed)
Well controlled on no medication  

## 2012-06-16 NOTE — Assessment & Plan Note (Signed)
Resolved with prism.

## 2012-06-16 NOTE — Assessment & Plan Note (Signed)
Heat, massage, gentle stretching.

## 2012-06-16 NOTE — Patient Instructions (Addendum)
Heat, massage and gentle stretching for shoulder blade symptom.  Follow up in 1 year for blood pressure recheck.

## 2012-11-13 ENCOUNTER — Observation Stay: Payer: Self-pay | Admitting: Obstetrics and Gynecology

## 2012-11-13 LAB — PIH PROFILE
Calcium, Total: 9 mg/dL (ref 8.5–10.1)
Co2: 21 mmol/L (ref 21–32)
EGFR (Non-African Amer.): >60
Glucose: 83 mg/dL (ref 65–99)
HCT: 32.8 % — ABNORMAL LOW (ref 35.0–47.0)
MCH: 29.4 pg (ref 26.0–34.0)
MCHC: 33.9 g/dL (ref 32.0–36.0)
MCV: 87 fL (ref 80–100)
Osmolality: 271 (ref 275–301)
Potassium: 4.3 mmol/L (ref 3.5–5.1)
RDW: 17.8 % — ABNORMAL HIGH (ref 11.5–14.5)
Sodium: 136 mmol/L (ref 136–145)
Uric Acid: 4.9 mg/dL (ref 2.6–6.0)

## 2012-11-13 LAB — PROTEIN / CREATININE RATIO, URINE: Protein/Creat. Ratio: 298 mg/gCREAT — ABNORMAL HIGH (ref 0–200)

## 2012-11-14 ENCOUNTER — Inpatient Hospital Stay: Payer: Self-pay | Admitting: Obstetrics & Gynecology

## 2012-11-15 LAB — PIH PROFILE
Anion Gap: 8 (ref 7–16)
Chloride: 107 mmol/L (ref 98–107)
Co2: 22 mmol/L (ref 21–32)
Creatinine: 0.71 mg/dL (ref 0.60–1.30)
EGFR (African American): >60
EGFR (Non-African Amer.): >60
MCHC: 33.8 g/dL (ref 32.0–36.0)
Platelet: 134 10*3/uL — ABNORMAL LOW (ref 150–440)
RBC: 3.58 10*6/uL — ABNORMAL LOW (ref 3.80–5.20)
Sodium: 137 mmol/L (ref 136–145)
Uric Acid: 4.5 mg/dL (ref 2.6–6.0)
WBC: 6.3 10*3/uL (ref 3.6–11.0)

## 2012-11-15 LAB — PROTEIN / CREATININE RATIO, URINE: Creatinine, Urine: 91.8 mg/dL (ref 30.0–125.0)

## 2012-11-19 ENCOUNTER — Emergency Department: Payer: Self-pay | Admitting: Emergency Medicine

## 2012-11-19 LAB — URINALYSIS, COMPLETE
Bacteria: NONE SEEN
Glucose,UR: NEGATIVE mg/dL (ref 0–75)
Nitrite: NEGATIVE
Ph: 8 (ref 4.5–8.0)
Specific Gravity: 1.015 (ref 1.003–1.030)

## 2012-11-19 LAB — CBC
HCT: 30.7 % — ABNORMAL LOW (ref 35.0–47.0)
HGB: 10.4 g/dL — ABNORMAL LOW (ref 12.0–16.0)
MCH: 31.1 pg (ref 26.0–34.0)
MCH: 30.1 pg (ref 26.0–34.0)
MCHC: 35.4 g/dL (ref 32.0–36.0)
MCHC: 34 g/dL (ref 32.0–36.0)
MCV: 88 fL (ref 80–100)
MCV: 89 fL (ref 80–100)
Platelet: 188 10*3/uL (ref 150–440)
RBC: 3.2 10*6/uL — ABNORMAL LOW (ref 3.80–5.20)
RBC: 3.47 10*6/uL — ABNORMAL LOW (ref 3.80–5.20)

## 2012-11-19 LAB — BASIC METABOLIC PANEL
Calcium, Total: 8 mg/dL — ABNORMAL LOW (ref 8.5–10.1)
Chloride: 104 mmol/L (ref 98–107)
Co2: 28 mmol/L (ref 21–32)
EGFR (Non-African Amer.): >60
Potassium: 3.2 mmol/L — ABNORMAL LOW (ref 3.5–5.1)
Sodium: 140 mmol/L (ref 136–145)

## 2012-11-19 LAB — COMPREHENSIVE METABOLIC PANEL
Albumin: 2.4 g/dL — ABNORMAL LOW (ref 3.4–5.0)
Alkaline Phosphatase: 99 U/L (ref 50–136)
BUN: 11 mg/dL (ref 7–18)
Chloride: 108 mmol/L — ABNORMAL HIGH (ref 98–107)
Co2: 23 mmol/L (ref 21–32)
Creatinine: 0.61 mg/dL (ref 0.60–1.30)
SGOT(AST): 30 U/L (ref 15–37)
SGPT (ALT): 18 U/L (ref 12–78)
Sodium: 139 mmol/L (ref 136–145)
Total Protein: 6.7 g/dL (ref 6.4–8.2)

## 2013-01-15 ENCOUNTER — Ambulatory Visit (INDEPENDENT_AMBULATORY_CARE_PROVIDER_SITE_OTHER): Payer: Managed Care, Other (non HMO) | Admitting: Family Medicine

## 2013-01-15 ENCOUNTER — Encounter: Payer: Self-pay | Admitting: Family Medicine

## 2013-01-15 ENCOUNTER — Encounter: Payer: Self-pay | Admitting: *Deleted

## 2013-01-15 VITALS — BP 120/82 | HR 104 | Temp 98.3°F | Ht 64.0 in | Wt 223.8 lb

## 2013-01-15 DIAGNOSIS — R509 Fever, unspecified: Secondary | ICD-10-CM

## 2013-01-15 DIAGNOSIS — J069 Acute upper respiratory infection, unspecified: Secondary | ICD-10-CM

## 2013-01-15 LAB — POCT INFLUENZA A/B: Influenza B, POC: NEGATIVE

## 2013-01-15 NOTE — Progress Notes (Signed)
  Subjective:    Patient ID: Andrea Colon, female    DOB: Oct 13, 1976, 37 y.o.   MRN: [id number]  Fever  This is a new problem. The current episode started in the past 7 days (3 days ). The problem has been gradually worsening. The maximum temperature noted was 100 to 100.9 F. The temperature was taken using an oral thermometer. Associated symptoms include congestion, coughing, headaches, muscle aches and a sore throat. Pertinent negatives include no chest pain, diarrhea, ear pain, rash, vomiting or wheezing. Associated symptoms comments: Sudden onset of myalgia No SOB, no wheeze  Cough just started.. Moist cough.. She has tried NSAIDs for the symptoms. The treatment provided moderate relief.      Review of Systems  Constitutional: Positive for fever.  HENT: Positive for congestion and sore throat. Negative for ear pain.   Respiratory: Positive for cough. Negative for wheezing.   Cardiovascular: Negative for chest pain.  Gastrointestinal: Negative for vomiting and diarrhea.  Skin: Negative for rash.  Neurological: Positive for headaches.       Objective:   Physical Exam  Constitutional: Vital signs are normal. She appears well-developed and well-nourished. She is cooperative.  Non-toxic appearance. She does not appear ill. No distress.  HENT:  Head: Normocephalic.  Right Ear: Hearing, tympanic membrane, external ear and ear canal normal. Tympanic membrane is not erythematous, not retracted and not bulging.  Left Ear: Hearing, tympanic membrane, external ear and ear canal normal. Tympanic membrane is not erythematous, not retracted and not bulging.  Nose: Mucosal edema and rhinorrhea present. Right sinus exhibits no maxillary sinus tenderness and no frontal sinus tenderness. Left sinus exhibits no maxillary sinus tenderness and no frontal sinus tenderness.  Mouth/Throat: Uvula is midline, oropharynx is clear and moist and mucous membranes are normal.  Eyes: Conjunctivae normal, EOM  and lids are normal. Pupils are equal, round, and reactive to light. No foreign bodies found.  Neck: Trachea normal and normal range of motion. Neck supple. Carotid bruit is not present. No mass and no thyromegaly present.  Cardiovascular: Normal rate, regular rhythm, S1 normal, S2 normal, normal heart sounds, intact distal pulses and normal pulses.  Exam reveals no gallop and no friction rub.   No murmur heard. Pulmonary/Chest: Effort normal and breath sounds normal. Not tachypneic. No respiratory distress. She has no decreased breath sounds. She has no wheezes. She has no rhonchi. She has no rales.  Neurological: She is alert.  Skin: Skin is warm, dry and intact. No rash noted.  Psychiatric: Her speech is normal and behavior is normal. Judgment normal. Her mood appears not anxious. Cognition and memory are normal. She does not exhibit a depressed mood.          Assessment & Plan:

## 2013-01-15 NOTE — Assessment & Plan Note (Signed)
Symptomatic care.  Avoid newborn baby. Flu negative.

## 2013-01-15 NOTE — Patient Instructions (Addendum)
Mucinex Dm for cough and congestion. Tylenol for fever and headache. Wash hands, wear mask, avoid contact with your new baby. Expect 7-10 days of illness. Stay out of work until 24 hour after fever resolved off antipyretics.

## 2013-02-09 ENCOUNTER — Ambulatory Visit: Payer: Self-pay | Admitting: Obstetrics and Gynecology

## 2013-03-18 ENCOUNTER — Encounter: Payer: Self-pay | Admitting: *Deleted

## 2013-09-21 ENCOUNTER — Encounter: Payer: Self-pay | Admitting: Family Medicine

## 2013-09-21 ENCOUNTER — Ambulatory Visit (INDEPENDENT_AMBULATORY_CARE_PROVIDER_SITE_OTHER): Payer: Managed Care, Other (non HMO) | Admitting: Family Medicine

## 2013-09-21 VITALS — BP 130/90 | HR 98 | Temp 98.5°F | Ht 64.0 in | Wt 239.2 lb

## 2013-09-21 DIAGNOSIS — I10 Essential (primary) hypertension: Secondary | ICD-10-CM

## 2013-09-21 NOTE — Patient Instructions (Addendum)
Follow BP at home.  Restarting regular exercise and healthy to lose the weight. Avoid decongestant. Stop smoking.  Follow up in 1 month for HTN follow up with labs prior.

## 2013-09-21 NOTE — Progress Notes (Signed)
  Subjective:    Patient ID: Andrea Colon, female    DOB: [birth year], 37 y.o.   MRN: [id number]  HPI  37 year old female presents for HTN follow up. She had a baby 10 months ago. She has history of preeclampsia   She has had well controlled BP of medication in  years prior.  1 week ago... For viral URI... BP was 139/89.  She has noted conjunctival hemorrhage .  She has recently gained weight back and restarted smoking.  She is doing well with anxiety/depression, now off zoloft.      Review of Systems  Constitutional: Negative for fever and fatigue.  HENT: Negative for ear pain.   Eyes: Negative for pain.  Respiratory: Negative for chest tightness and shortness of breath.   Cardiovascular: Negative for chest pain, palpitations and leg swelling.  Gastrointestinal: Negative for abdominal pain.  Genitourinary: Negative for dysuria.       Objective:   Physical Exam  Constitutional: Vital signs are normal. She appears well-developed and well-nourished. She is cooperative.  Non-toxic appearance. She does not appear ill. No distress.  obese  HENT:  Head: Normocephalic.  Right Ear: Hearing, tympanic membrane, external ear and ear canal normal. Tympanic membrane is not erythematous, not retracted and not bulging.  Left Ear: Hearing, tympanic membrane, external ear and ear canal normal. Tympanic membrane is not erythematous, not retracted and not bulging.  Nose: No mucosal edema or rhinorrhea. Right sinus exhibits no maxillary sinus tenderness and no frontal sinus tenderness. Left sinus exhibits no maxillary sinus tenderness and no frontal sinus tenderness.  Mouth/Throat: Uvula is midline, oropharynx is clear and moist and mucous membranes are normal.  Eyes: EOM and lids are normal. Pupils are equal, round, and reactive to light. Lids are everted and swept, no foreign bodies found.  Conjunctival hemmorhage right eye  Neck: Trachea normal and normal range of motion. Neck supple.  Carotid bruit is not present. No mass and no thyromegaly present.  Cardiovascular: Normal rate, regular rhythm, S1 normal, S2 normal, normal heart sounds, intact distal pulses and normal pulses.  Exam reveals no gallop and no friction rub.   No murmur heard. Pulmonary/Chest: Effort normal and breath sounds normal. Not tachypneic. No respiratory distress. She has no decreased breath sounds. She has no wheezes. She has no rhonchi. She has no rales.  Abdominal: Soft. Normal appearance and bowel sounds are normal. There is no tenderness.  Neurological: She is alert.  Skin: Skin is warm, dry and intact. No rash noted.  Psychiatric: Her speech is normal and behavior is normal. Judgment and thought content normal. Her mood appears not anxious. Cognition and memory are normal. She does not exhibit a depressed mood.          Assessment & Plan:

## 2013-09-21 NOTE — Assessment & Plan Note (Signed)
Get back on track with lifestyle changes. Encouraged exercise, weight loss, healthy eating habits. Stop smoking. Follow BP.. If consistently elevated will restart HCTZ. Follow up in 1 month.

## 2013-10-12 ENCOUNTER — Other Ambulatory Visit: Payer: Managed Care, Other (non HMO)

## 2013-10-12 ENCOUNTER — Other Ambulatory Visit: Payer: Self-pay | Admitting: Family Medicine

## 2013-10-12 DIAGNOSIS — E785 Hyperlipidemia, unspecified: Secondary | ICD-10-CM

## 2013-10-12 DIAGNOSIS — R5381 Other malaise: Secondary | ICD-10-CM

## 2013-10-13 ENCOUNTER — Other Ambulatory Visit (INDEPENDENT_AMBULATORY_CARE_PROVIDER_SITE_OTHER): Payer: Managed Care, Other (non HMO)

## 2013-10-13 DIAGNOSIS — R5381 Other malaise: Secondary | ICD-10-CM

## 2013-10-13 DIAGNOSIS — R5383 Other fatigue: Secondary | ICD-10-CM

## 2013-10-13 DIAGNOSIS — E781 Pure hyperglyceridemia: Secondary | ICD-10-CM

## 2013-10-13 DIAGNOSIS — I10 Essential (primary) hypertension: Secondary | ICD-10-CM

## 2013-10-13 DIAGNOSIS — R7309 Other abnormal glucose: Secondary | ICD-10-CM

## 2013-10-13 DIAGNOSIS — R7303 Prediabetes: Secondary | ICD-10-CM

## 2013-10-13 DIAGNOSIS — E785 Hyperlipidemia, unspecified: Secondary | ICD-10-CM

## 2013-10-13 LAB — CBC WITH DIFFERENTIAL/PLATELET
Basophils Absolute: 0.1 10*3/uL (ref 0.0–0.1)
Basophils Relative: 0.8 % (ref 0.0–3.0)
HCT: 36.6 % (ref 36.0–46.0)
Hemoglobin: 12.6 g/dL (ref 12.0–15.0)
Lymphs Abs: 2.1 10*3/uL (ref 0.7–4.0)
MCV: 80.8 fl (ref 78.0–100.0)
Monocytes Absolute: 0.5 10*3/uL (ref 0.1–1.0)
Monocytes Relative: 6.4 % (ref 3.0–12.0)
Neutro Abs: 4.9 10*3/uL (ref 1.4–7.7)
Neutrophils Relative %: 63.9 % (ref 43.0–77.0)
Platelets: 281 10*3/uL (ref 150.0–400.0)
RDW: 14 % (ref 11.5–14.6)

## 2013-10-13 LAB — BASIC METABOLIC PANEL
BUN: 13 mg/dL (ref 6–23)
CO2: 27 mEq/L (ref 19–32)
Chloride: 101 mEq/L (ref 96–112)
Glucose, Bld: 97 mg/dL (ref 70–99)
Potassium: 4 mEq/L (ref 3.5–5.1)

## 2013-10-13 LAB — LIPID PANEL
Cholesterol: 127 mg/dL (ref 0–200)
Triglycerides: 306 mg/dL — ABNORMAL HIGH (ref 0.0–149.0)

## 2013-10-13 LAB — HEPATIC FUNCTION PANEL
ALT: 13 U/L (ref 0–35)
AST: 15 U/L (ref 0–37)
Albumin: 3.8 g/dL (ref 3.5–5.2)
Alkaline Phosphatase: 38 U/L — ABNORMAL LOW (ref 39–117)
Total Protein: 7.6 g/dL (ref 6.0–8.3)

## 2013-10-13 LAB — LDL CHOLESTEROL, DIRECT: Direct LDL: 54.3 mg/dL

## 2013-10-13 LAB — TSH: TSH: 1.33 u[IU]/mL (ref 0.35–5.50)

## 2013-10-19 ENCOUNTER — Ambulatory Visit (INDEPENDENT_AMBULATORY_CARE_PROVIDER_SITE_OTHER): Payer: Managed Care, Other (non HMO) | Admitting: Family Medicine

## 2013-10-19 ENCOUNTER — Encounter: Payer: Self-pay | Admitting: Family Medicine

## 2013-10-19 VITALS — BP 110/70 | HR 98 | Temp 98.4°F | Ht 64.0 in | Wt 234.5 lb

## 2013-10-19 DIAGNOSIS — R7309 Other abnormal glucose: Secondary | ICD-10-CM

## 2013-10-19 DIAGNOSIS — E781 Pure hyperglyceridemia: Secondary | ICD-10-CM

## 2013-10-19 DIAGNOSIS — I10 Essential (primary) hypertension: Secondary | ICD-10-CM

## 2013-10-19 DIAGNOSIS — F325 Major depressive disorder, single episode, in full remission: Secondary | ICD-10-CM

## 2013-10-19 DIAGNOSIS — E66812 Obesity, class 2: Secondary | ICD-10-CM | POA: Insufficient documentation

## 2013-10-19 DIAGNOSIS — R7303 Prediabetes: Secondary | ICD-10-CM

## 2013-10-19 DIAGNOSIS — IMO0002 Reserved for concepts with insufficient information to code with codable children: Secondary | ICD-10-CM

## 2013-10-19 NOTE — Assessment & Plan Note (Signed)
Restart fish oil. Recheck in 3 month.

## 2013-10-19 NOTE — Assessment & Plan Note (Signed)
Well controlled at this time on no medication. Recommend weight loss.

## 2013-10-19 NOTE — Assessment & Plan Note (Signed)
Health issues are intricately related to her weight status. Discussed in detail with pt.  

## 2013-10-19 NOTE — Progress Notes (Signed)
  Subjective:    Patient ID: Andrea Colon, female    DOB: [birth year], 37 y.o.   MRN: [id number]  HPI 37 year old female presents for follow up of HTN.   Hypertension:  Hx of preeclampsia with baby 11 months ago. Hx of HTN in past on HCTZ. Since last OV she has lost some weight BP Readings from Last 3 Encounters:  10/19/13 110/70  09/21/13 130/90  01/15/13 120/82   Wt Readings from Last 3 Encounters:  10/19/13 234 lb 8 oz (106.369 kg)  09/21/13 239 lb 4 oz (108.523 kg)  01/15/13 223 lb 12.8 oz (101.515 kg)  Using medication without problems or lightheadedness:  Chest pain with exertion:None Edema:None Short of breath:None Average home BPs: not checking. Other issues: Exercise: none Diet:moderate, decreasing sweet tea. Coworker and her are working on [employer] out lunch.  Still smoking. Sh plans on using nictoine replacement.  Mood well controlled on current meds.  Lab Results  Component Value Date   CHOL 127 10/13/2013   HDL 29.80* 10/13/2013   LDLCALC 82 11/13/2011   LDLDIRECT 54.3 10/13/2013   TRIG 306.0* 10/13/2013   CHOLHDL 4 10/13/2013       Review of Systems  Constitutional: Negative for fever and fatigue.  HENT: Negative for ear pain.   Eyes: Negative for pain.  Respiratory: Negative for chest tightness and shortness of breath.   Cardiovascular: Negative for chest pain, palpitations and leg swelling.  Gastrointestinal: Negative for abdominal pain.  Genitourinary: Negative for dysuria.       Objective:   Physical Exam  Constitutional: Vital signs are normal. She appears well-developed and well-nourished. She is cooperative.  Non-toxic appearance. She does not appear ill. No distress.  Overweight female in NAD  HENT:  Head: Normocephalic.  Right Ear: Hearing, tympanic membrane, external ear and ear canal normal. Tympanic membrane is not erythematous, not retracted and not bulging.  Left Ear: Hearing, tympanic membrane, external ear and ear  canal normal. Tympanic membrane is not erythematous, not retracted and not bulging.  Nose: No mucosal edema or rhinorrhea. Right sinus exhibits no maxillary sinus tenderness and no frontal sinus tenderness. Left sinus exhibits no maxillary sinus tenderness and no frontal sinus tenderness.  Mouth/Throat: Uvula is midline, oropharynx is clear and moist and mucous membranes are normal.  Eyes: Conjunctivae, EOM and lids are normal. Pupils are equal, round, and reactive to light. Lids are everted and swept, no foreign bodies found.  Neck: Trachea normal and normal range of motion. Neck supple. Carotid bruit is not present. No mass and no thyromegaly present.  Cardiovascular: Normal rate, regular rhythm, S1 normal, S2 normal, normal heart sounds, intact distal pulses and normal pulses.  Exam reveals no gallop and no friction rub.   No murmur heard. Pulmonary/Chest: Effort normal and breath sounds normal. Not tachypneic. No respiratory distress. She has no decreased breath sounds. She has no wheezes. She has no rhonchi. She has no rales.  Abdominal: Soft. Normal appearance and bowel sounds are normal. There is no tenderness.  Neurological: She is alert.  Skin: Skin is warm, dry and intact. No rash noted.  Psychiatric: Her speech is normal and behavior is normal. Judgment and thought content normal. Her mood appears not anxious. Cognition and memory are normal. She does not exhibit a depressed mood.          Assessment & Plan:

## 2013-10-19 NOTE — Assessment & Plan Note (Signed)
Resolved

## 2013-10-19 NOTE — Patient Instructions (Addendum)
Start fish oil 2000 mg divided daily for high trig.  Work on increasing exercise and lower fat, chol and carb diet.  Quit smoking.

## 2014-01-17 ENCOUNTER — Other Ambulatory Visit (INDEPENDENT_AMBULATORY_CARE_PROVIDER_SITE_OTHER): Payer: Managed Care, Other (non HMO)

## 2014-01-17 DIAGNOSIS — E781 Pure hyperglyceridemia: Secondary | ICD-10-CM

## 2014-01-17 LAB — LIPID PANEL
CHOL/HDL RATIO: 4
CHOLESTEROL: 138 mg/dL (ref 0–200)
HDL: 31.3 mg/dL — ABNORMAL LOW (ref >39.00)
Triglycerides: 290 mg/dL — ABNORMAL HIGH (ref 0.0–149.0)
VLDL: 58 mg/dL — ABNORMAL HIGH (ref 0.0–40.0)

## 2014-01-17 LAB — LDL CHOLESTEROL, DIRECT: Direct LDL: 68.6 mg/dL

## 2014-01-20 ENCOUNTER — Ambulatory Visit (INDEPENDENT_AMBULATORY_CARE_PROVIDER_SITE_OTHER): Payer: Managed Care, Other (non HMO) | Admitting: Family Medicine

## 2014-01-20 ENCOUNTER — Encounter: Payer: Self-pay | Admitting: Family Medicine

## 2014-01-20 VITALS — BP 128/90 | HR 100 | Temp 98.5°F | Ht 64.0 in | Wt 238.0 lb

## 2014-01-20 DIAGNOSIS — I10 Essential (primary) hypertension: Secondary | ICD-10-CM

## 2014-01-20 NOTE — Progress Notes (Signed)
Pre-visit discussion using our clinic review tool. No additional management support is needed unless otherwise documented below in the visit note.  

## 2014-01-21 ENCOUNTER — Encounter: Payer: Self-pay | Admitting: Family Medicine

## 2014-01-21 ENCOUNTER — Ambulatory Visit (INDEPENDENT_AMBULATORY_CARE_PROVIDER_SITE_OTHER): Payer: Managed Care, Other (non HMO) | Admitting: Family Medicine

## 2014-01-21 VITALS — BP 130/90 | HR 93 | Temp 98.4°F | Ht 64.0 in | Wt 236.5 lb

## 2014-01-21 DIAGNOSIS — E781 Pure hyperglyceridemia: Secondary | ICD-10-CM

## 2014-01-21 DIAGNOSIS — Z8719 Personal history of other diseases of the digestive system: Secondary | ICD-10-CM

## 2014-01-21 DIAGNOSIS — R1011 Right upper quadrant pain: Secondary | ICD-10-CM

## 2014-01-21 DIAGNOSIS — I10 Essential (primary) hypertension: Secondary | ICD-10-CM

## 2014-01-21 NOTE — Assessment & Plan Note (Signed)
Minimal improvement... Start fish oil. Recheck prior to CPX.

## 2014-01-21 NOTE — Assessment & Plan Note (Signed)
Borderline control. Follow at home. May need to restart HCTZ.

## 2014-01-21 NOTE — Progress Notes (Signed)
Pre-visit discussion using our clinic review tool. No additional management support is needed unless otherwise documented below in the visit note.  

## 2014-01-21 NOTE — Progress Notes (Signed)
MD delayed, pt had to leave.

## 2014-01-21 NOTE — Assessment & Plan Note (Signed)
Refer to surgeon for consult.   

## 2014-01-21 NOTE — Patient Instructions (Addendum)
Fish oil/Flax seed oil 1200 mg twice daily. Start exercising. Follow BP at home...call if more than three measurements in a row. Stop at front desk to set up referral to surgery. Avoid greasy food. Use ibuprofen 800 mg for pain. Go to ER if pain > 3 hours, uncontrollable vomiting or fever.
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
## 2014-01-26 ENCOUNTER — Telehealth: Payer: Self-pay | Admitting: Family Medicine

## 2014-01-26 NOTE — Telephone Encounter (Signed)
Relevant patient education assigned to patient using Emmi. ° °

## 2014-02-10 ENCOUNTER — Ambulatory Visit: Payer: Self-pay | Admitting: General Surgery

## 2014-03-02 ENCOUNTER — Encounter: Payer: Self-pay | Admitting: General Surgery

## 2014-03-02 ENCOUNTER — Ambulatory Visit (INDEPENDENT_AMBULATORY_CARE_PROVIDER_SITE_OTHER): Payer: Commercial Indemnity | Admitting: General Surgery

## 2014-03-02 VITALS — BP 130/90 | HR 80 | Resp 14 | Ht 64.0 in | Wt 233.0 lb

## 2014-03-02 DIAGNOSIS — K8020 Calculus of gallbladder without cholecystitis without obstruction: Secondary | ICD-10-CM

## 2014-03-02 NOTE — Progress Notes (Signed)
Patient ID: Andrea Colon, female   DOB: [birth year], 38 y.o.   MRN: [id number]  Chief Complaint  Patient presents with  . Other    gallstones    HPI Andrea Colon is a 38 y.o. female. here today for a evalaution of gallstones. Patient states she had abdomen ultrasound done on 02/09/13. Patient states the attack have been going on for one year now. Usually happening several hours after eating mostly at night. Associated with nausea,and vomiting. Last attack was 02/02/14. The pain is in her right upper quadrant and last about two hours.   HPI  Past Medical History  Diagnosis Date  . Acne     Past Surgical History  Procedure Laterality Date  . Appendectomy      2005   . Cesarean section  11/17/12  . Dilation and curettage of uterus  07/2010    Family History  Problem Relation Age of Onset  . Breast cancer Paternal Aunt 93    Social History History  Substance Use Topics  . Smoking status: Current Every Day Smoker -- 1.00 packs/day for 10 years    Types: Cigarettes  . Smokeless tobacco: Never Used  . Alcohol Use: No    Allergies  Allergen Reactions  . Sulfa Antibiotics Hives    Current Outpatient Prescriptions  Medication Sig Dispense Refill  . Melatonin 5 MG TABS Take 1 tablet by mouth at bedtime.       No current facility-administered medications for this visit.    Review of Systems Review of Systems  Constitutional: Negative.   Eyes: Negative.   Respiratory: Negative.   Cardiovascular: Negative.   Gastrointestinal: Positive for nausea and vomiting. Negative for abdominal pain, diarrhea, constipation, blood in stool, abdominal distention, anal bleeding and rectal pain.    Blood pressure 130/90, pulse 80, resp. rate 14, height 5\' 4"  (1.626 m), weight 233 lb (105.688 kg), last menstrual period 02/02/2014.  Physical Exam Physical Exam  Constitutional: She is oriented to person, place, and time. She appears well-developed and well-nourished.  Eyes:  Conjunctivae are normal.  Neck: Neck supple.  Cardiovascular: Normal rate, regular rhythm and normal heart sounds.   Pulmonary/Chest: Effort normal and breath sounds normal.  Abdominal: Soft. Normal appearance and bowel sounds are normal. There is no hepatomegaly. There is no tenderness. No hernia.  Neurological: She is alert and oriented to person, place, and time.  Skin: Skin is warm and dry.    Data Reviewed Abdominal ultrasound dated February 09, 2013 showed multiple stones within the gallbladder. Gallbladder wall thickness 2.4 mm. Common bile duct 3.3 mm. No sonographic Murphy sign. Suggestions of mild hepatomegaly.  Assessment    Symptomatic cholelithiasis     Plan    The risks and benefits associated with biliary tract surgery were reviewed. Bleeding, infection as well as possible injury to the common bile duct was discussed.  A prescription for Percocet 5/325, #30 with the inscription 1-2 p.o. Q.4 h. P.r.n. For pain was provided primarily for postoperative use, but also she has a severe attack between now and her scheduled surgery.     Patient's surgery has been scheduled for 03-31-14 at [facility]. This is per patient's request to wait till after month end at work. She is also wanting to wait closer to the end of the week to have surgery completed.   Robert Bellow 03/05/2014, 8:41 PM   Eliezer Lofts, MD; Humberto Leep, MD

## 2014-03-02 NOTE — Patient Instructions (Addendum)
Cholecystostomy  The gallbladder is a pear-shaped organ that lies beneath the liver on the right side of the body. The gallbladder stores bile, a fluid that helps the body digest fats. However, sometimes bile and other fluids build up in the gallbladder because of an obstruction (for example, a gallstones). This can cause fever, pain, swelling, nausea and other serious symptoms. The procedure used to drain these fluids is called a cholecystostomy. A tube is inserted into the gallbladder. Fluid drains through the tube into a plastic bag outside the body. This procedure is usually done on people who are admitted to the hospital. The procedure is often recommended for people who cannot have gallbladder surgery right away, usually because they are too ill to make it through surgery. The cholecystostomy tube is usually temporary, until surgery can be done. RISKS AND COMPLICATIONS Although rare, complications can include:  Clogging of the tube.  Infection in or around the drain site. Antibiotics might be prescribed for the infection. Or, another tube might be inserted to drain the infected fluid.  Internal bleeding from the liver. BEFORE THE PROCEDURE   Try to quit smoking several weeks before the procedure. Smoking can slow healing.  Arrange for someone to drive you home from the hospital.  Right before your procedure, avoid all foods and liquids after midnight. This includes coffee, tea and water.  On the day of the procedure, arrive early to fill out all the paperwork. PROCEDURE You will be given a sedative to make you sleepy and a local anesthetic to numb the skin. Next, a small cut is made in the abdomen. Then a tube is threaded through the cut into the gallbladder. The procedure is usually done with ultrasound to guide the tube into the gallbladder. Once the tube is in place, the drain is secured to the skin with a stitch. The tube is then connected to a drainage bag.  AFTER THE PROCEDURE    People who have a cholecystostomy usually stay in the hospital for several days because they are so ill. You might not be able to eat for the first few days. Instead, you will be connected to an IV for fluids and nutrients.  The procedure does not cure the blockage that caused the fluid to build up in the first place. Because of this, the gallbladder will need to be removed in the future. The drain is removed at that time. HOME CARE INSTRUCTIONS  Be sure to follow your healthcare provider's instructions carefully. You may shower but avoid tub baths and swimming until your caregiver says it is OK. Eat and drink according to the directions you have been given. And be sure to make all follow-up appointments.  Call your healthcare provider if you notice new pain, redness or swelling around the wound. SEEK IMMEDIATE MEDICAL CARE IF:   There is increased abdominal pain.  Nausea or vomiting occurs.  You develop a fever.  The drainage tube comes out of the abdomen. Document Released: 03/07/2009 Document Revised: 03/02/2012 Document Reviewed: 03/07/2009 [facility] Patient Information 2014 [place], [place].  Patient's surgery has been scheduled for 03-31-14 at [facility].

## 2014-03-05 ENCOUNTER — Other Ambulatory Visit: Payer: Self-pay | Admitting: General Surgery

## 2014-03-05 DIAGNOSIS — K8020 Calculus of gallbladder without cholecystitis without obstruction: Secondary | ICD-10-CM | POA: Insufficient documentation

## 2014-03-16 ENCOUNTER — Telehealth: Payer: Self-pay

## 2014-03-16 NOTE — Telephone Encounter (Signed)
Noted  

## 2014-03-16 NOTE — Telephone Encounter (Signed)
pt said this AM started with dizziness and h/a; 11:30 am BP was 156/103; pt continues with slight dizziness and h/a pain level now is 2. Pt said BP not usually high; pt did ck BP in Feb when had flu and BP was 150s/100. Pt has not cked BP again until today when noticed dizziness and h/a. No SOB or CP. Pt did eat bacon last night. [employer].

## 2014-03-16 NOTE — Telephone Encounter (Signed)
Agreed, thanks. Routed to PCP at [facility].

## 2014-03-16 NOTE — Telephone Encounter (Signed)
Pt is not having any blurred vision and pt scheduled appt with Dr Diona Browner on 03/17/14 at 11:00 AM(first available appt that pt could schedule); advised pt if condition changes or worsens prior to appt pt will go to [facility].Dr Damita Dunnings agreed; Pt voiced understanding.

## 2014-03-17 ENCOUNTER — Ambulatory Visit (INDEPENDENT_AMBULATORY_CARE_PROVIDER_SITE_OTHER): Payer: Managed Care, Other (non HMO) | Admitting: Family Medicine

## 2014-03-17 ENCOUNTER — Telehealth: Payer: Self-pay | Admitting: Family Medicine

## 2014-03-17 ENCOUNTER — Encounter: Payer: Self-pay | Admitting: Family Medicine

## 2014-03-17 VITALS — BP 138/93 | HR 84 | Temp 98.1°F | Ht 64.0 in | Wt 234.5 lb

## 2014-03-17 DIAGNOSIS — I10 Essential (primary) hypertension: Secondary | ICD-10-CM

## 2014-03-17 MED ORDER — HYDROCHLOROTHIAZIDE 25 MG PO TABS
25.0000 mg | ORAL_TABLET | Freq: Every day | ORAL | Status: DC
Start: 2014-03-17 — End: 2014-03-22

## 2014-03-17 NOTE — Telephone Encounter (Signed)
Relevant patient education assigned to patient using Emmi. ° °

## 2014-03-17 NOTE — Progress Notes (Signed)
   Subjective:    Patient ID: Andrea Colon, female    DOB: [birth year], 38 y.o.   MRN: [id number]  Hypertension Pertinent negatives include no chest pain, palpitations or shortness of breath.  Dizziness Pertinent negatives include no abdominal pain, chest pain, fatigue or fever.   38 year old female with history of  HTN, PCOS, palpitations and depression presents with recent fuzzy headed episode, dizzy (lightheaded , not vertigo) spell and BP elevated 156/103, checked at grocery store.  Felt warm and flushed.  Also had BP elevation when she had flu in 01/2014, 145/100, possibly was on decongestant. At surgical appt  140/90, has scheduled cholecystectomy.  Not better with tylenol.  No  edema. No cheat pain. No SOB. No blurred vision.   She noted BP elevation after eating bacon.   BP Readings from Last 3 Encounters:  03/17/14 138/93  03/02/14 130/90  01/21/14 130/90   Wt Readings from Last 3 Encounters:  03/17/14 234 lb 8 oz (106.369 kg)  03/02/14 233 lb (105.688 kg)  01/21/14 236 lb 8 oz (107.276 kg)      Review of Systems  Constitutional: Negative for fever and fatigue.  HENT: Negative for ear pain.   Eyes: Negative for pain.  Respiratory: Negative for chest tightness and shortness of breath.   Cardiovascular: Negative for chest pain, palpitations and leg swelling.  Gastrointestinal: Negative for abdominal pain.  Genitourinary: Negative for dysuria.  Neurological: Positive for dizziness.       Objective:   Physical Exam  Constitutional: Vital signs are normal. She appears well-developed and well-nourished. She is cooperative.  Non-toxic appearance. She does not appear ill. No distress.  Morbidly obese  HENT:  Head: Normocephalic.  Right Ear: Hearing, tympanic membrane, external ear and ear canal normal. Tympanic membrane is not erythematous, not retracted and not bulging.  Left Ear: Hearing, tympanic membrane, external ear and ear canal normal. Tympanic  membrane is not erythematous, not retracted and not bulging.  Nose: No mucosal edema or rhinorrhea. Right sinus exhibits no maxillary sinus tenderness and no frontal sinus tenderness. Left sinus exhibits no maxillary sinus tenderness and no frontal sinus tenderness.  Mouth/Throat: Uvula is midline, oropharynx is clear and moist and mucous membranes are normal.  Eyes: Conjunctivae, EOM and lids are normal. Pupils are equal, round, and reactive to light. Lids are everted and swept, no foreign bodies found.  Neck: Trachea normal and normal range of motion. Neck supple. Carotid bruit is not present. No mass and no thyromegaly present.  Cardiovascular: Normal rate, regular rhythm, S1 normal, S2 normal, normal heart sounds, intact distal pulses and normal pulses.  Exam reveals no gallop and no friction rub.   No murmur heard. Pulmonary/Chest: Effort normal and breath sounds normal. Not tachypneic. No respiratory distress. She has no decreased breath sounds. She has no wheezes. She has no rhonchi. She has no rales.  Abdominal: Soft. Normal appearance and bowel sounds are normal. There is no tenderness.  Neurological: She is alert.  Skin: Skin is warm, dry and intact. No rash noted.  Psychiatric: Her speech is normal and behavior is normal. Judgment and thought content normal. Her mood appears not anxious. Cognition and memory are normal. She does not exhibit a depressed mood.          Assessment & Plan:

## 2014-03-17 NOTE — Patient Instructions (Signed)
Follow BP at home.  Goal < 140/90. Start HCTZ daily. Work on low fat, low salt diet , exercise and weight loss.  Follow up in 2 weeks.

## 2014-03-17 NOTE — Assessment & Plan Note (Signed)
Inadequate control. Has gained weight back.  likely cause of recent symptoms.

## 2014-03-17 NOTE — Progress Notes (Signed)
Pre visit review using our clinic review tool, if applicable. No additional management support is needed unless otherwise documented below in the visit note. 

## 2014-03-21 ENCOUNTER — Telehealth: Payer: Self-pay | Admitting: Family Medicine

## 2014-03-21 NOTE — Telephone Encounter (Signed)
Patient Information:  Caller Name: Yaritzi  Phone: [phone]  Patient: Andrea Colon, Andrea Colon  Gender: Female  DOB: Apr 25, 1976  Age: 38 Years  PCP: Eliezer Lofts (Family Practice)  Pregnant: No  Office Follow Up:  Does the office need to follow up with this patient?: Yes  Instructions For The Office: Please call and advise.   Symptoms  Reason For Call & Symptoms: Pt is calling back to see if she should increase her BP med. Pt was started on HCTZ  25mg  QD which she started on 03/17/14 . Pt takes it in the am. She is calling to let the MD know that her BP has been 151/100 - 153/96 in the morning or evening it is remaining high. Pt is nervous about having this kind of a BP reading with her pending gallbladder removal scheduled for 03/31/14. Please call and advise her if this is what was expected 5 days into the medication.  Reviewed Health History In EMR: Yes  Reviewed Medications In EMR: Yes  Reviewed Allergies In EMR: Yes  Reviewed Surgeries / Procedures: N/A  Date of Onset of Symptoms: 03/17/2014 OB / GYN:  LMP: Unknown  Guideline(s) Used:  High Blood Pressure  Disposition Per Guideline:   See Within 2 Weeks in Office  Reason For Disposition Reached:   BP > 140/90 and is taking BP medications  Advice Given:  N/A  Patient Refused Recommendation:  Patient Requests Prescription  wanting MD to note progress and see if dose should be increased. Or if it will take awhile to bring it down/should she push her gallbladder surgery out further.

## 2014-03-21 NOTE — Telephone Encounter (Signed)
Notify pt that we will need to change to a combo med  Like losartan HCTZ as HCTZ 25 is the max dose effective. I will send it in if she is agreeable. Will need to check kidney function 7-10 days after starting.

## 2014-03-22 MED ORDER — LOSARTAN POTASSIUM-HCTZ 50-12.5 MG PO TABS: 1.0000 | ORAL_TABLET | Freq: Every day | ORAL | Status: DC

## 2014-03-22 NOTE — Telephone Encounter (Signed)
Patient notified as instructed by telephone. 

## 2014-03-22 NOTE — Telephone Encounter (Signed)
She needs to use birth control to prevent pregnancy whn on this med.  Sent to pharm.

## 2014-03-22 NOTE — Telephone Encounter (Signed)
Patient notified as instructed by telephone.  She is agreeable to new BP medication.  She uses [facility] [address]. Has follow up already scheduled on 03/29/2014.  Advised we could do her labs at that appointment.

## 2014-03-29 ENCOUNTER — Ambulatory Visit (INDEPENDENT_AMBULATORY_CARE_PROVIDER_SITE_OTHER): Payer: Managed Care, Other (non HMO) | Admitting: Family Medicine

## 2014-03-29 ENCOUNTER — Encounter: Payer: Self-pay | Admitting: Family Medicine

## 2014-03-29 ENCOUNTER — Telehealth: Payer: Self-pay | Admitting: Family Medicine

## 2014-03-29 VITALS — BP 114/78 | HR 96 | Temp 98.3°F | Ht 64.0 in | Wt 234.8 lb

## 2014-03-29 DIAGNOSIS — I10 Essential (primary) hypertension: Secondary | ICD-10-CM

## 2014-03-29 LAB — BASIC METABOLIC PANEL
BUN: 9 mg/dL (ref 6–23)
CO2: 28 mEq/L (ref 19–32)
Calcium: 8.9 mg/dL (ref 8.4–10.5)
Chloride: 102 mEq/L (ref 96–112)
Creatinine, Ser: 0.7 mg/dL (ref 0.4–1.2)
GFR: 104.43 mL/min (ref >60.00)
GLUCOSE: 91 mg/dL (ref 70–99)
POTASSIUM: 3.7 meq/L (ref 3.5–5.1)
SODIUM: 136 meq/L (ref 135–145)

## 2014-03-29 NOTE — Patient Instructions (Signed)
Follow up HTN in 6 months fasting labs prior

## 2014-03-29 NOTE — Progress Notes (Signed)
   Subjective:    Patient ID: Andrea Colon, female    DOB: August 13, 1976, 38 y.o.   MRN: [id number]  HPI  38 year old female presents for 2 week follow up BP.   Hypertension:HCTZ alone was ineffective... Changed to HCTZ losartan on 03/21/2014 Her BPnis much improved now on this med. BP Readings from Last 3 Encounters:  03/29/14 114/78  03/17/14 138/93  03/02/14 130/90  Using medication without problems or lightheadedness:  None Chest pain with exertion:None Edema: none Short of breath: none Average home BPs: 120/70s She has less foggy head, feels better overall. No dizziness. Other issues:  She is having upcoming gallbladder surgery. Needs EKG and labs for pre-op. Will perform today.   Review of Systems  Constitutional: Negative for fever and fatigue.  HENT: Negative for ear pain.   Eyes: Negative for pain.  Respiratory: Negative for chest tightness and shortness of breath.   Cardiovascular: Negative for chest pain, palpitations and leg swelling.  Gastrointestinal: Negative for abdominal pain.  Genitourinary: Negative for dysuria.       Objective:   Physical Exam  Constitutional: Vital signs are normal. She appears well-developed and well-nourished. She is cooperative.  Non-toxic appearance. She does not appear ill. No distress.  Morbidly obese  HENT:  Head: Normocephalic.  Right Ear: Hearing, tympanic membrane, external ear and ear canal normal. Tympanic membrane is not erythematous, not retracted and not bulging.  Left Ear: Hearing, tympanic membrane, external ear and ear canal normal. Tympanic membrane is not erythematous, not retracted and not bulging.  Nose: No mucosal edema or rhinorrhea. Right sinus exhibits no maxillary sinus tenderness and no frontal sinus tenderness. Left sinus exhibits no maxillary sinus tenderness and no frontal sinus tenderness.  Mouth/Throat: Uvula is midline, oropharynx is clear and moist and mucous membranes are normal.  Eyes:  Conjunctivae, EOM and lids are normal. Pupils are equal, round, and reactive to light. Lids are everted and swept, no foreign bodies found.  Neck: Trachea normal and normal range of motion. Neck supple. Carotid bruit is not present. No mass and no thyromegaly present.  Cardiovascular: Normal rate, regular rhythm, S1 normal, S2 normal, normal heart sounds, intact distal pulses and normal pulses.  Exam reveals no gallop and no friction rub.   No murmur heard. Pulmonary/Chest: Effort normal and breath sounds normal. Not tachypneic. No respiratory distress. She has no decreased breath sounds. She has no wheezes. She has no rhonchi. She has no rales.  Abdominal: Soft. Normal appearance and bowel sounds are normal. There is no tenderness.  Neurological: She is alert.  Skin: Skin is warm, dry and intact. No rash noted.  Psychiatric: Her speech is normal and behavior is normal. Judgment and thought content normal. Her mood appears not anxious. Cognition and memory are normal. She does not exhibit a depressed mood.          Assessment & Plan:

## 2014-03-29 NOTE — Progress Notes (Signed)
Pre visit review using our clinic review tool, if applicable. No additional management support is needed unless otherwise documented below in the visit note. 

## 2014-03-29 NOTE — Telephone Encounter (Signed)
Relevant patient education assigned to patient using Emmi. ° °

## 2014-03-31 ENCOUNTER — Ambulatory Visit: Payer: Self-pay | Admitting: General Surgery

## 2014-03-31 DIAGNOSIS — K8010 Calculus of gallbladder with chronic cholecystitis without obstruction: Secondary | ICD-10-CM

## 2014-03-31 HISTORY — PX: CHOLECYSTECTOMY: SHX55

## 2014-04-01 ENCOUNTER — Ambulatory Visit: Payer: Managed Care, Other (non HMO) | Admitting: Family Medicine

## 2014-04-02 LAB — PATHOLOGY REPORT

## 2014-04-04 ENCOUNTER — Encounter: Payer: Self-pay | Admitting: General Surgery

## 2014-04-05 ENCOUNTER — Telehealth: Payer: Self-pay | Admitting: *Deleted

## 2014-04-05 NOTE — Telephone Encounter (Signed)
Call noted.  F/U appt scheduled for April 16th.

## 2014-04-05 NOTE — Telephone Encounter (Signed)
Phone call from patient and she complaints of a sensation of feeling "tight around her rib cage area". Denies vomiting or bloated. Advised to try heating pad and Advil for comfort. Her bowels are moving with meals. Discussed dressing care as well. Follow up as needed.

## 2014-04-07 ENCOUNTER — Ambulatory Visit (INDEPENDENT_AMBULATORY_CARE_PROVIDER_SITE_OTHER): Payer: Commercial Indemnity | Admitting: General Surgery

## 2014-04-07 ENCOUNTER — Encounter: Payer: Self-pay | Admitting: General Surgery

## 2014-04-07 VITALS — BP 140/90 | HR 76 | Resp 14 | Ht 65.0 in | Wt 227.0 lb

## 2014-04-07 DIAGNOSIS — K8020 Calculus of gallbladder without cholecystitis without obstruction: Secondary | ICD-10-CM

## 2014-04-07 NOTE — Patient Instructions (Signed)
Patient to return as needed. Proper lifting techniques reviewed. 

## 2014-04-07 NOTE — Progress Notes (Signed)
Patient ID: Andrea Colon, female   DOB: [birth year], 38 y.o.   MRN: [id number]  Chief Complaint  Patient presents with  . Routine Post Op    gallbladder    HPI Andrea Colon is a 38 y.o. female here today for her post op gallbladder surgery done on 03/31/14. Patient states she is okay. she complaints of a sensation of feeling "tight around her rib cage area". This has improved since her phone call to the nurse 2 days ago. She is experiencing loose stools shortly after meals. No abdominal pain with eating.  HPI  Past Medical History  Diagnosis Date  . Acne     Past Surgical History  Procedure Laterality Date  . Appendectomy      2005   . Cesarean section  11/17/12  . Dilation and curettage of uterus  07/2010  . Cholecystectomy  03/31/14    Family History  Problem Relation Age of Onset  . Breast cancer Paternal Aunt 55    Social History History  Substance Use Topics  . Smoking status: Current Every Day Smoker -- 1.00 packs/day for 10 years    Types: Cigarettes  . Smokeless tobacco: Never Used  . Alcohol Use: No    Allergies  Allergen Reactions  . Sulfa Antibiotics Hives    Current Outpatient Prescriptions  Medication Sig Dispense Refill  . losartan-hydrochlorothiazide (HYZAAR) 50-12.5 MG per tablet Take 1 tablet by mouth daily.  90 tablet  3  . Melatonin 5 MG TABS Take 1 tablet by mouth at bedtime.       No current facility-administered medications for this visit.    Review of Systems Review of Systems  Constitutional: Negative.   Respiratory: Negative.   Cardiovascular: Negative.     Blood pressure 140/90, pulse 76, resp. rate 14, height 5\' 5"  (1.651 m), weight 227 lb (102.967 kg), last menstrual period 03/03/2014.  Physical Exam Physical Exam  Constitutional: She is oriented to person, place, and time. She appears well-developed and well-nourished.  Eyes: Conjunctivae are normal.  Neck: Neck supple.  Cardiovascular: Normal rate, regular rhythm and  normal heart sounds.   Pulmonary/Chest: Effort normal and breath sounds normal.  Abdominal: Soft. Normal appearance and bowel sounds are normal.  Port sites look clean and healing well.   Neurological: She is alert and oriented to person, place, and time.  Skin: Skin is warm and dry.    Data Reviewed Chronic cholecystitis/ cholelithiasis and cholesterolosis.   Assessment    Doing well status post cholecystectomy.     Plan    The patient will increase her activity as tolerated. She may resume her exercise avoiding situps at any time. She was encouraged to call if the residual discomfort in the upper abdomen fails to resolve.  Postprandial bowel movements should improve with time.    PCP: Herb Grays Romaine Neville 04/07/2014, 9:15 PM

## 2014-04-13 ENCOUNTER — Telehealth: Payer: Self-pay | Admitting: Family Medicine

## 2014-04-13 NOTE — Telephone Encounter (Signed)
Pt called stating she started BP med first of April and was working good up until a week ago and is now getting conststant readings of 140/97. Pt has not missed any doses of medication and did not know if you want to increase since reading are still high? Please advise.

## 2014-04-14 MED ORDER — LOSARTAN POTASSIUM-HCTZ 50-12.5 MG PO TABS: 2.0000 | ORAL_TABLET | Freq: Every day | ORAL | Status: DC

## 2014-04-14 NOTE — Telephone Encounter (Signed)
Patient notified as instructed by telephone.  She is not having any chest pain or SOB.  Just states she started having that foggy headed feeling like previous when it was high and that is what made her check her blood pressure.   Will increase her losartan-hctz to two tablets daily.  New prescription sent to [facility] [address]  Follow up appointment scheduled for 04/26/2014 @ 9:15 am.

## 2014-04-14 NOTE — Telephone Encounter (Signed)
Have her increase to 2 tabs daily and send in rx or change on med list.  Any other symptoms like chest pain or SOB.  She will need to return for BP check in 7-10 days, will check kidney function then to given increase of medication.

## 2014-04-26 ENCOUNTER — Encounter: Payer: Self-pay | Admitting: Family Medicine

## 2014-04-26 ENCOUNTER — Ambulatory Visit (INDEPENDENT_AMBULATORY_CARE_PROVIDER_SITE_OTHER): Payer: Managed Care, Other (non HMO) | Admitting: Family Medicine

## 2014-04-26 VITALS — BP 116/80 | HR 89 | Temp 98.4°F | Ht 64.0 in | Wt 230.0 lb

## 2014-04-26 DIAGNOSIS — I10 Essential (primary) hypertension: Secondary | ICD-10-CM

## 2014-04-26 DIAGNOSIS — F411 Generalized anxiety disorder: Secondary | ICD-10-CM | POA: Insufficient documentation

## 2014-04-26 LAB — BASIC METABOLIC PANEL
BUN: 13 mg/dL (ref 6–23)
CHLORIDE: 102 meq/L (ref 96–112)
CO2: 27 meq/L (ref 19–32)
CREATININE: 0.8 mg/dL (ref 0.4–1.2)
Calcium: 9.1 mg/dL (ref 8.4–10.5)
GFR: 82.68 mL/min (ref >60.00)
Glucose, Bld: 83 mg/dL (ref 70–99)
Potassium: 3.7 mEq/L (ref 3.5–5.1)
Sodium: 137 mEq/L (ref 135–145)

## 2014-04-26 NOTE — Assessment & Plan Note (Signed)
Moderate control. Refer to counselor for stress reduction techniques.

## 2014-04-26 NOTE — Progress Notes (Signed)
38 year old female presents for follow up BP.    Hypertension:HCTZ alone was ineffective... Changed to HCTZ losartan on 03/21/2014  Had improved BP until 4/23 when she noted BP 140/97 and foggy headed feeling. She then was instructed to increase losartan HCTZ to 2 tabs daily. Snce then she reports BP is much improved now on this med.   Using medication without problems or lightheadedness: occ Chest pain with exertion:None  Edema: none  Short of breath: none  Average home BPs: 120/70s  She has less foggy head, feels better overall.  occ dizziness.  Other issues:    She does continue to have some anxiety, but depression better. She will consider counseling.   She is status post gallbladder surgery. Review of Systems  Constitutional: Negative for fever and fatigue.  HENT: Negative for ear pain.  Eyes: Negative for pain.  Respiratory: Negative for chest tightness and shortness of breath.  Cardiovascular: Negative for chest pain, palpitations and leg swelling.  Gastrointestinal: Negative for abdominal pain.  Genitourinary: Negative for dysuria.  Objective:   Physical Exam  Constitutional: Vital signs are normal. She appears well-developed and well-nourished. She is cooperative. Non-toxic appearance. She does not appear ill. No distress.  Morbidly obese  HENT:  Head: Normocephalic.  Right Ear: Hearing, tympanic membrane, external ear and ear canal normal. Tympanic membrane is not erythematous, not retracted and not bulging.  Left Ear: Hearing, tympanic membrane, external ear and ear canal normal. Tympanic membrane is not erythematous, not retracted and not bulging.  Nose: No mucosal edema or rhinorrhea. Right sinus exhibits no maxillary sinus tenderness and no frontal sinus tenderness. Left sinus exhibits no maxillary sinus tenderness and no frontal sinus tenderness.  Mouth/Throat: Uvula is midline, oropharynx is clear and moist and mucous membranes are normal.  Eyes: Conjunctivae, EOM  and lids are normal. Pupils are equal, round, and reactive to light. Lids are everted and swept, no foreign bodies found.  Neck: Trachea normal and normal range of motion. Neck supple. Carotid bruit is not present. No mass and no thyromegaly present.  Cardiovascular: Normal rate, regular rhythm, S1 normal, S2 normal, normal heart sounds, intact distal pulses and normal pulses. Exam reveals no gallop and no friction rub.  No murmur heard.  Pulmonary/Chest: Effort normal and breath sounds normal. Not tachypneic. No respiratory distress. She has no decreased breath sounds. She has no wheezes. She has no rhonchi. She has no rales.  Abdominal: Soft. Normal appearance and bowel sounds are normal. There is no tenderness.  Neurological: She is alert.  Skin: Skin is warm, dry and intact. No rash noted.  Psychiatric: Her speech is normal and behavior is normal. Judgment and thought content normal. Her mood appears not anxious. Cognition and memory are normal. She does not exhibit a depressed mood.

## 2014-04-26 NOTE — Progress Notes (Signed)
Pre visit review using our clinic review tool, if applicable. No additional management support is needed unless otherwise documented below in the visit note. 

## 2014-04-26 NOTE — Assessment & Plan Note (Signed)
Well controlled. Continue current medication.  Due for cr check on higher dose of med.

## 2014-04-26 NOTE — Patient Instructions (Addendum)
Stop at lab and front desk on way out to get labs drawn and to pick up info on counseling services.

## 2014-07-07 ENCOUNTER — Encounter: Payer: Self-pay | Admitting: Family Medicine

## 2014-07-07 ENCOUNTER — Telehealth: Payer: Self-pay | Admitting: Family Medicine

## 2014-07-07 ENCOUNTER — Encounter (INDEPENDENT_AMBULATORY_CARE_PROVIDER_SITE_OTHER): Payer: Self-pay

## 2014-07-07 ENCOUNTER — Ambulatory Visit (INDEPENDENT_AMBULATORY_CARE_PROVIDER_SITE_OTHER): Payer: Managed Care, Other (non HMO) | Admitting: Family Medicine

## 2014-07-07 VITALS — BP 106/76 | HR 92 | Temp 98.1°F | Ht 64.0 in | Wt 224.8 lb

## 2014-07-07 DIAGNOSIS — F5104 Psychophysiologic insomnia: Secondary | ICD-10-CM | POA: Insufficient documentation

## 2014-07-07 DIAGNOSIS — L301 Dyshidrosis [pompholyx]: Secondary | ICD-10-CM

## 2014-07-07 DIAGNOSIS — G4700 Insomnia, unspecified: Secondary | ICD-10-CM

## 2014-07-07 MED ORDER — TRAZODONE HCL 50 MG PO TABS: 25.0000 mg | ORAL_TABLET | Freq: Every evening | ORAL | Status: DC | PRN

## 2014-07-07 MED ORDER — TRIAMCINOLONE ACETONIDE 0.5 % EX CREA: 1.0000 "application " | TOPICAL_CREAM | Freq: Two times a day (BID) | CUTANEOUS | Status: DC

## 2014-07-07 NOTE — Assessment & Plan Note (Signed)
Steroid cream BID. Referral to derm if not improving.

## 2014-07-07 NOTE — Progress Notes (Signed)
Pre visit review using our clinic review tool, if applicable. No additional management support is needed unless otherwise documented below in the visit note. 

## 2014-07-07 NOTE — Progress Notes (Signed)
   Subjective:    Patient ID: Andrea Colon, female    DOB: 11/03/1976, 38 y.o.   MRN: [id number]  HPI   38 year old female presents today with recurrent rash on hands.  She first had issue in 2008. In last few months she has had worsening of the dry flaky rash ion fingers, now spread to palms.  Dry cracking skin, blisters at time.  She has tried changing to hypo allergenic and chemical free detergents.   She has used aquafore, lanolin in past with limited relief.  She washes her hands a lot with her new child.  She has no rash elsewhere.    She has also had several years of chronic insomnia. Anxiety is well controlled.  She had used Ambien in past but has been out for months.  She has tried melatonin but still wakes up every 2 hours. Benadryl.   Review of Systems  Constitutional: Negative for fever and fatigue.  HENT: Negative for ear pain.   Eyes: Negative for pain.  Respiratory: Negative for chest tightness and shortness of breath.   Cardiovascular: Negative for chest pain, palpitations and leg swelling.  Gastrointestinal: Negative for abdominal pain.  Genitourinary: Negative for dysuria.       Objective:   Physical Exam  Constitutional: Vital signs are normal. She appears well-developed and well-nourished. She is cooperative.  Non-toxic appearance. She does not appear ill. No distress.  HENT:  Head: Normocephalic.  Right Ear: Hearing, tympanic membrane, external ear and ear canal normal. Tympanic membrane is not erythematous, not retracted and not bulging.  Left Ear: Hearing, tympanic membrane, external ear and ear canal normal. Tympanic membrane is not erythematous, not retracted and not bulging.  Nose: No mucosal edema or rhinorrhea. Right sinus exhibits no maxillary sinus tenderness and no frontal sinus tenderness. Left sinus exhibits no maxillary sinus tenderness and no frontal sinus tenderness.  Mouth/Throat: Uvula is midline, oropharynx is clear and moist and  mucous membranes are normal.  Eyes: Conjunctivae, EOM and lids are normal. Pupils are equal, round, and reactive to light. Lids are everted and swept, no foreign bodies found.  Neck: Trachea normal and normal range of motion. Neck supple. Carotid bruit is not present. No mass and no thyromegaly present.  Cardiovascular: Normal rate, regular rhythm, S1 normal, S2 normal, normal heart sounds, intact distal pulses and normal pulses.  Exam reveals no gallop and no friction rub.   No murmur heard. Pulmonary/Chest: Effort normal and breath sounds normal. Not tachypneic. No respiratory distress. She has no decreased breath sounds. She has no wheezes. She has no rhonchi. She has no rales.  Abdominal: Soft. Normal appearance and bowel sounds are normal. There is no tenderness.  Neurological: She is alert.  Skin: Skin is warm, dry and intact. No rash noted.  Dry cracking erythematous skin in B fingers and some on palms. Some blisters.  Psychiatric: Her speech is normal and behavior is normal. Judgment and thought content normal. Her mood appears not anxious. Cognition and memory are normal. She does not exhibit a depressed mood.          Assessment & Plan:

## 2014-07-07 NOTE — Patient Instructions (Addendum)
Apply steroid cream to hands twice daily. Dry hands after washing quickly. Use moisturizing cream such as cetaphil.  Start trazodone for sleep.

## 2014-07-07 NOTE — Telephone Encounter (Signed)
Relevant patient education assigned to patient using Emmi. ° °

## 2014-07-07 NOTE — Assessment & Plan Note (Signed)
Discussed sleep hygiene in detail. INfo packet given.   Trial of trazodone. If ineffective will treat with ambien 5 mg daily. She was instructed to limit any sleep med as able.

## 2014-08-05 ENCOUNTER — Encounter: Payer: Self-pay | Admitting: Family Medicine

## 2014-08-05 MED ORDER — CLONAZEPAM 0.5 MG PO TABS: 0.5000 mg | ORAL_TABLET | Freq: Every day | ORAL | Status: DC

## 2014-08-08 NOTE — Telephone Encounter (Signed)
Your prescription has been called to the pharmacy.

## 2014-09-08 ENCOUNTER — Other Ambulatory Visit: Payer: Self-pay | Admitting: Family Medicine

## 2014-09-08 NOTE — Telephone Encounter (Signed)
Last office visit 08/05/2014.  Last refilled 08/05/2014 for #30 with not refills.  Ok to refill?

## 2014-09-09 MED ORDER — CLONAZEPAM 0.5 MG PO TABS: 0.5000 mg | ORAL_TABLET | Freq: Every day | ORAL | Status: DC

## 2014-09-09 NOTE — Telephone Encounter (Signed)
Called to [facility]

## 2014-09-23 ENCOUNTER — Encounter: Payer: Self-pay | Admitting: Radiology

## 2014-09-23 ENCOUNTER — Telehealth: Payer: Self-pay | Admitting: Family Medicine

## 2014-09-23 ENCOUNTER — Other Ambulatory Visit (INDEPENDENT_AMBULATORY_CARE_PROVIDER_SITE_OTHER): Payer: Managed Care, Other (non HMO)

## 2014-09-23 DIAGNOSIS — E781 Pure hyperglyceridemia: Secondary | ICD-10-CM

## 2014-09-23 LAB — COMPREHENSIVE METABOLIC PANEL
ALT: 11 U/L (ref 0–35)
AST: 15 U/L (ref 0–37)
Albumin: 3.7 g/dL (ref 3.5–5.2)
Alkaline Phosphatase: 41 U/L (ref 39–117)
BILIRUBIN TOTAL: 0.3 mg/dL (ref 0.2–1.2)
BUN: 13 mg/dL (ref 6–23)
CALCIUM: 8.8 mg/dL (ref 8.4–10.5)
CHLORIDE: 102 meq/L (ref 96–112)
CO2: 27 mEq/L (ref 19–32)
CREATININE: 0.8 mg/dL (ref 0.4–1.2)
GFR: 88.71 mL/min (ref >60.00)
Glucose, Bld: 100 mg/dL — ABNORMAL HIGH (ref 70–99)
Potassium: 3.8 mEq/L (ref 3.5–5.1)
Sodium: 136 mEq/L (ref 135–145)
Total Protein: 7.5 g/dL (ref 6.0–8.3)

## 2014-09-23 LAB — LIPID PANEL
Cholesterol: 123 mg/dL (ref 0–200)
HDL: 26.1 mg/dL — ABNORMAL LOW (ref >39.00)
NonHDL: 96.9
Total CHOL/HDL Ratio: 5
Triglycerides: 260 mg/dL — ABNORMAL HIGH (ref 0.0–149.0)
VLDL: 52 mg/dL — ABNORMAL HIGH (ref 0.0–40.0)

## 2014-09-23 LAB — LDL CHOLESTEROL, DIRECT: Direct LDL: 53.5 mg/dL

## 2014-09-23 NOTE — Telephone Encounter (Signed)
Message copied by Jinny Sanders on Fri Sep 23, 2014  8:28 AM ------      Message from: Ellamae Sia      Created: Thu Sep 15, 2014  2:59 PM      Regarding: Lab orders for FRIDAY, 10.2.15       Lab orders for f/u ------

## 2014-09-30 ENCOUNTER — Encounter: Payer: Self-pay | Admitting: Family Medicine

## 2014-09-30 ENCOUNTER — Other Ambulatory Visit: Payer: Self-pay | Admitting: Family Medicine

## 2014-09-30 ENCOUNTER — Ambulatory Visit (INDEPENDENT_AMBULATORY_CARE_PROVIDER_SITE_OTHER): Payer: Managed Care, Other (non HMO) | Admitting: Family Medicine

## 2014-09-30 VITALS — BP 100/80 | HR 92 | Temp 98.4°F | Ht 64.0 in | Wt 227.5 lb

## 2014-09-30 DIAGNOSIS — I10 Essential (primary) hypertension: Secondary | ICD-10-CM

## 2014-09-30 DIAGNOSIS — F17200 Nicotine dependence, unspecified, uncomplicated: Secondary | ICD-10-CM | POA: Insufficient documentation

## 2014-09-30 DIAGNOSIS — R7309 Other abnormal glucose: Secondary | ICD-10-CM

## 2014-09-30 DIAGNOSIS — Z720 Tobacco use: Secondary | ICD-10-CM

## 2014-09-30 DIAGNOSIS — R7303 Prediabetes: Secondary | ICD-10-CM

## 2014-09-30 DIAGNOSIS — G4700 Insomnia, unspecified: Secondary | ICD-10-CM

## 2014-09-30 DIAGNOSIS — F5104 Psychophysiologic insomnia: Secondary | ICD-10-CM

## 2014-09-30 DIAGNOSIS — E781 Pure hyperglyceridemia: Secondary | ICD-10-CM

## 2014-09-30 MED ORDER — VARENICLINE TARTRATE 0.5 MG X 11 & 1 MG X 42 PO MISC: ORAL | Status: DC

## 2014-09-30 MED ORDER — BUPROPION HCL ER (XL) 150 MG PO TB24: 150.0000 mg | ORAL_TABLET | Freq: Every day | ORAL | Status: DC

## 2014-09-30 NOTE — Assessment & Plan Note (Signed)
The patient was counseled on the dangers of tobacco use, and was advised to quit.  Reviewed strategies to maximize success, including removing cigarettes and smoking materials from environment, stress management, substitution of other forms of reinforcement, support of family/friends and written materials  Prescription options for cessation were discussed. She was given a coupon and a prescription to start chantix.

## 2014-09-30 NOTE — Assessment & Plan Note (Signed)
Stable recent labs were nonfasting.

## 2014-09-30 NOTE — Assessment & Plan Note (Signed)
Well controlled. Continue current medication.  

## 2014-09-30 NOTE — Progress Notes (Signed)
Pre visit review using our clinic review tool, if applicable. No additional management support is needed unless otherwise documented below in the visit note. 

## 2014-09-30 NOTE — Assessment & Plan Note (Signed)
Stable control on clonazepam

## 2014-09-30 NOTE — Progress Notes (Signed)
38 year old female presents for 6 month follow up .  Hypertension: excellent control on losartan HCTZ to 2 tabs daily.  BP Readings from Last 3 Encounters:  09/30/14 100/80  07/07/14 106/76  04/26/14 116/80  Using medication without problems or lightheadedness: occ  Chest pain with exertion:None  Edema: none  Short of breath: none  Average home BPs:  Has not been checking at home. Other issues:   Wt Readings from Last 3 Encounters:  09/30/14 227 lb 8 oz (103.193 kg)  07/07/14 224 lb 12 oz (101.946 kg)  04/26/14 230 lb (104.327 kg)     Elevated Cholesterol: LDL excellent control on no med.  Trig high. Lab Results  Component Value Date   CHOL 123 09/23/2014   HDL 26.10* 09/23/2014   LDLCALC 82 11/13/2011   LDLDIRECT 53.5 09/23/2014   TRIG 260.0* 09/23/2014   CHOLHDL 5 09/23/2014  Using medications without problems: Muscle aches:  Diet compliance: Exercise: Other complaints:   Drank coffee with sugar before labs. Nonfasting.  Chronic insomnia: Using clonazepam at bedtime for sleep. Failed trazodone, caused SE.   Review of Systems  Constitutional: Negative for fever and fatigue.  HENT: Negative for ear pain.  Eyes: Negative for pain.  Respiratory: Negative for chest tightness and shortness of breath.  Cardiovascular: Negative for chest pain, palpitations and leg swelling.  Gastrointestinal: Negative for abdominal pain.  Genitourinary: Negative for dysuria.  Objective:   Physical Exam  Constitutional: Vital signs are normal. She appears well-developed and well-nourished. She is cooperative. Non-toxic appearance. She does not appear ill. No distress.  Morbidly obese  HENT:  Head: Normocephalic.  Right Ear: Hearing, tympanic membrane, external ear and ear canal normal. Tympanic membrane is not erythematous, not retracted and not bulging.  Left Ear: Hearing, tympanic membrane, external ear and ear canal normal. Tympanic membrane is not erythematous, not retracted and not  bulging.  Nose: No mucosal edema or rhinorrhea. Right sinus exhibits no maxillary sinus tenderness and no frontal sinus tenderness. Left sinus exhibits no maxillary sinus tenderness and no frontal sinus tenderness.  Mouth/Throat: Uvula is midline, oropharynx is clear and moist and mucous membranes are normal.  Eyes: Conjunctivae, EOM and lids are normal. Pupils are equal, round, and reactive to light. Lids are everted and swept, no foreign bodies found.  Neck: Trachea normal and normal range of motion. Neck supple. Carotid bruit is not present. No mass and no thyromegaly present.  Cardiovascular: Normal rate, regular rhythm, S1 normal, S2 normal, normal heart sounds, intact distal pulses and normal pulses. Exam reveals no gallop and no friction rub.  No murmur heard.  Pulmonary/Chest: Effort normal and breath sounds normal. Not tachypneic. No respiratory distress. She has no decreased breath sounds. She has no wheezes. She has no rhonchi. She has no rales.  Abdominal: Soft. Normal appearance and bowel sounds are normal. There is no tenderness.  Neurological: She is alert.  Skin: Skin is warm, dry and intact. No rash noted.  Psychiatric: Her speech is normal and behavior is normal. Judgment and thought content normal. Her mood appears not anxious. Cognition and memory are normal. She does not exhibit a depressed mood.

## 2014-09-30 NOTE — Assessment & Plan Note (Signed)
Restart fish oil, exercise and weight loss. Recheck in 53months.

## 2014-09-30 NOTE — Patient Instructions (Addendum)
Get back to exercising regularly.  Low cholesterol diet.  Decrease fatty foods and carbs as well.  2000 mg divided daily of omega three.  Stop smoking. Call 1-800-QUITNOW for smoking cessation. Start chantix as discussed.

## 2014-10-10 ENCOUNTER — Encounter: Payer: Self-pay | Admitting: Family Medicine

## 2014-10-10 ENCOUNTER — Other Ambulatory Visit: Payer: Self-pay | Admitting: Family Medicine

## 2014-10-10 MED ORDER — CLONAZEPAM 0.5 MG PO TABS: 0.5000 mg | ORAL_TABLET | Freq: Every day | ORAL | Status: DC

## 2014-10-10 NOTE — Telephone Encounter (Signed)
Last office visit 09/30/2014.  Last refilled 09/09/2014 for #30 with no refills.  Ok to refill?

## 2014-10-11 NOTE — Telephone Encounter (Signed)
Called to [facility]

## 2014-10-24 ENCOUNTER — Encounter: Payer: Self-pay | Admitting: Family Medicine

## 2014-11-11 ENCOUNTER — Other Ambulatory Visit: Payer: Self-pay | Admitting: Family Medicine

## 2014-11-11 MED ORDER — CLONAZEPAM 0.5 MG PO TABS: 0.5000 mg | ORAL_TABLET | Freq: Every day | ORAL | Status: DC

## 2014-11-11 NOTE — Telephone Encounter (Signed)
Called to [facility]

## 2014-12-08 ENCOUNTER — Encounter: Payer: Self-pay | Admitting: Family Medicine

## 2014-12-11 ENCOUNTER — Other Ambulatory Visit: Payer: Self-pay | Admitting: Family Medicine

## 2014-12-12 NOTE — Telephone Encounter (Signed)
Last office visit 09/30/2014.  Last refilled 11/11/2014 for #30 with no refills.  Ok to refill?

## 2014-12-13 MED ORDER — CLONAZEPAM 0.5 MG PO TABS: 0.5000 mg | ORAL_TABLET | Freq: Every day | ORAL | Status: DC

## 2014-12-13 NOTE — Telephone Encounter (Signed)
Called to [facility]

## 2015-01-10 ENCOUNTER — Other Ambulatory Visit: Payer: Self-pay | Admitting: Family Medicine

## 2015-01-11 MED ORDER — CLONAZEPAM 0.5 MG PO TABS: 0.5000 mg | ORAL_TABLET | Freq: Every day | ORAL | Status: DC

## 2015-01-11 NOTE — Telephone Encounter (Signed)
Last office visit 09/30/2014.  Last refilled 12/13/2014 for #30 with no refills.  Ok to refill?

## 2015-01-12 NOTE — Telephone Encounter (Signed)
Called to [facility]

## 2015-01-17 ENCOUNTER — Encounter: Payer: Self-pay | Admitting: Family Medicine

## 2015-01-17 NOTE — Telephone Encounter (Signed)
Contact pharmacy to see what happened. Refill if appropriate.

## 2015-02-07 ENCOUNTER — Other Ambulatory Visit: Payer: Self-pay | Admitting: Family Medicine

## 2015-02-07 MED ORDER — CLONAZEPAM 0.5 MG PO TABS: 0.5000 mg | ORAL_TABLET | Freq: Every day | ORAL | Status: DC

## 2015-02-07 NOTE — Telephone Encounter (Signed)
Last office visit 10/10/2014.  Last refilled 01/11/2015 for #30 with no refills.  Ok to refill?

## 2015-02-07 NOTE — Telephone Encounter (Signed)
Called to [facility]

## 2015-03-08 ENCOUNTER — Other Ambulatory Visit: Payer: Self-pay | Admitting: Family Medicine

## 2015-03-08 NOTE — Telephone Encounter (Signed)
Last office visit 09/30/2014.  Last refilled 02/07/2015 for #30 with no refills.  Ok to refill.

## 2015-03-09 MED ORDER — CLONAZEPAM 0.5 MG PO TABS: 0.5000 mg | ORAL_TABLET | Freq: Every day | ORAL | Status: DC

## 2015-03-09 NOTE — Telephone Encounter (Signed)
Medication phoned to pharmacy.  

## 2015-03-13 ENCOUNTER — Encounter: Payer: Self-pay | Admitting: Family Medicine

## 2015-03-28 ENCOUNTER — Ambulatory Visit (INDEPENDENT_AMBULATORY_CARE_PROVIDER_SITE_OTHER): Payer: Managed Care, Other (non HMO) | Admitting: Family Medicine

## 2015-03-28 ENCOUNTER — Encounter: Payer: Self-pay | Admitting: Family Medicine

## 2015-03-28 ENCOUNTER — Telehealth: Payer: Self-pay | Admitting: Family Medicine

## 2015-03-28 VITALS — BP 118/82 | HR 86 | Temp 98.8°F | Wt 226.2 lb

## 2015-03-28 DIAGNOSIS — I493 Ventricular premature depolarization: Secondary | ICD-10-CM

## 2015-03-28 DIAGNOSIS — R002 Palpitations: Secondary | ICD-10-CM

## 2015-03-28 LAB — BASIC METABOLIC PANEL
BUN: 10 mg/dL (ref 6–23)
CO2: 28 mEq/L (ref 19–32)
CREATININE: 0.7 mg/dL (ref 0.40–1.20)
Calcium: 9.2 mg/dL (ref 8.4–10.5)
Chloride: 102 mEq/L (ref 96–112)
GFR: 98.77 mL/min (ref >60.00)
Glucose, Bld: 88 mg/dL (ref 70–99)
Potassium: 3.9 mEq/L (ref 3.5–5.1)
Sodium: 137 mEq/L (ref 135–145)

## 2015-03-28 LAB — TSH: TSH: 1.81 u[IU]/mL (ref 0.35–4.50)

## 2015-03-28 MED ORDER — METOPROLOL TARTRATE 25 MG PO TABS: 12.5000 mg | ORAL_TABLET | Freq: Two times a day (BID) | ORAL | Status: DC

## 2015-03-28 NOTE — Telephone Encounter (Signed)
Patient is schedule to see Dr. Damita Dunnings today at 12:30 pm.

## 2015-03-28 NOTE — Assessment & Plan Note (Signed)
Would recheck bmet and TSH today.   EKG with PACs and PVCs noted.   This could still be anxiety related.  D/w pt.   No ominous sx at this point.   Wouldn't restart anxiety meds, but continue clonopin for now.  Start low dose BB, if low BP then cut her hyzaar in the meantime.  She agrees.   Still okay for outpatient f/u.  Would have her f/u with (or at least update) PCP in near future. >25 minutes spent in face to face time with patient, >50% spent in counselling or coordination of care.

## 2015-03-28 NOTE — Telephone Encounter (Signed)
Pt needs an appt

## 2015-03-28 NOTE — Progress Notes (Signed)
Pre visit review using our clinic review tool, if applicable. No additional management support is needed unless otherwise documented below in the visit note.  H/o anxiety.  Still taking klonopin at night.  Had been on wellbutrin prev to help stop smoking.  Off wellbutrin now.  35 days off cigarettes now.  Prev was on zoloft, in distant past.   Palpitations started after stopping smoking, about 1 month ago, before stopping wellbutrin.  Sx are worse since stopping wellbutrin.  She has trouble distinguishing panic sx with inc HR vs primary inc in HP/palpitations.    No syncope.  No CP.  No BLE edema.  She cut out caffeine in the meantime.    Meds, vitals, and allergies reviewed.   ROS: See HPI.  Otherwise, noncontributory.  GEN: nad, alert and oriented NECK: supple w/o LA CV: rrr with ectopy noted.  no murmur PULM: ctab, no inc wob ABD: soft, +bs EXT: no edema  EKG reviewed.  PAC and PVCs noted.

## 2015-03-28 NOTE — Telephone Encounter (Signed)
Patient Name: Andrea Colon DOB: 12-02-1976 Initial Comment caller states she has anxiety and an irregular heartbeat Nurse Assessment Nurse: Marcelline Deist, RN, Kermit Balo Date/Time (Eastern Time): 03/28/2015 9:04:10 AM Confirm and document reason for call. If symptomatic, describe symptoms. ---Caller states she has anxiety and an irregular heartbeat, feels like it is racing with skipped beats. She is not sure what started first, been going on for 4 weeks now. This all started after quitting smoking. Has the patient traveled out of the country within the last 30 days? ---Not Applicable Does the patient require triage? ---Yes Related visit to physician within the last 2 weeks? ---No Does the PT have any chronic conditions? (i.e. diabetes, asthma, etc.) ---Yes List chronic conditions. ---on BP rx, anxiety, depression Did the patient indicate they were pregnant? ---No Guidelines Guideline Title Affirmed Question Affirmed Notes Chest Pain [1] Chest pain lasts > 5 minutes AND [2] occurred > 3 days ago (72 hours) AND [3] NO chest pain or cardiac symptoms now does not describe as pain, is a tightness like anxiety feeling Final Disposition User See Physician within [place], RN, [employer] states she has had a tightness/feeling of anxiety in her chest, not a pain, but it lasts along with these episodes of her heart racing & palpitations. States she recently quit smoking, went off Wellbutrin on advice of her Dr., has lost 14 pounds within a month, eating better, exercising, etc.

## 2015-03-28 NOTE — Patient Instructions (Signed)
Go to the lab on the way out.  We'll contact you with your lab report. Start taking metoprolol 1/2 tab twice a day.  Recheck with Dr. Diona Browner in about 1 week.  Take care.  If you get lightheaded, then cut the dose of hyzaar back to 1 pill a day.

## 2015-03-30 ENCOUNTER — Ambulatory Visit: Payer: Managed Care, Other (non HMO) | Admitting: Family Medicine

## 2015-04-04 ENCOUNTER — Encounter: Payer: Self-pay | Admitting: Family Medicine

## 2015-04-04 ENCOUNTER — Ambulatory Visit (INDEPENDENT_AMBULATORY_CARE_PROVIDER_SITE_OTHER): Payer: Managed Care, Other (non HMO) | Admitting: Family Medicine

## 2015-04-04 VITALS — BP 104/70 | HR 79 | Temp 97.9°F | Ht 64.0 in | Wt 223.2 lb

## 2015-04-04 DIAGNOSIS — R002 Palpitations: Secondary | ICD-10-CM | POA: Diagnosis not present

## 2015-04-04 DIAGNOSIS — F411 Generalized anxiety disorder: Secondary | ICD-10-CM

## 2015-04-04 MED ORDER — SERTRALINE HCL 50 MG PO TABS: 50.0000 mg | ORAL_TABLET | Freq: Every day | ORAL | Status: DC

## 2015-04-04 MED ORDER — METOPROLOL TARTRATE 25 MG PO TABS: 25.0000 mg | ORAL_TABLET | Freq: Two times a day (BID) | ORAL | Status: DC

## 2015-04-04 NOTE — Patient Instructions (Addendum)
Increase to 1 full tab of metoprolol twice daily. Start sertraline 50 mg at bedtime. Continue healthy lifestyle. Continue clonazepam for now. Keep follow up as scheduled.

## 2015-04-04 NOTE — Progress Notes (Signed)
   Subjective:    Patient ID: Andrea Colon, female    DOB: [birth year], 39 y.o.   MRN: [id number]  HPI  39 year old female presents with new onset palpitations in last month. Occurred at night but gradually worsened to constantly. Felt like skipping a beat, occ  Felt like racing fast.  Felt like she could not catch her breath. Exercise made it worse.  No chest pain, some chest tightness.  HR 95   HX of GAD, panic attack and depression, major.   Initially felt it was due to her wellbutrin , not improvement off the wellbutrin. She takes clonazepam at night for several month.   She has tried taking magnesium, increasing water. No caffeine.  BP Readings from Last 3 Encounters:  04/04/15 104/70  03/28/15 118/82  09/30/14 100/80   She saw Dr. Damita Dunnings on 4/5. EKG: showed  PACs and PVCs.  BMET, TSH nml On metoprolol 12.5 mg BID. She feels like it wears off after 7-8 hours. HR 70-91 She has been eating better, trying to exercise, quit smoking.  PH2 was very positive. She has anhedonia.  Trouble sleeping at night. Clonazepam not helping any more. No SI, no HI, some thoughts of not wanting to be around.   Was on zoloft in past, no known problems with this.  Mother with SVT. Review of Systems  All other systems reviewed and are negative.      Objective:   Physical Exam  Constitutional: Vital signs are normal. She appears well-developed and well-nourished. She is cooperative.  Non-toxic appearance. She does not appear ill. No distress.  HENT:  Head: Normocephalic.  Right Ear: Hearing, tympanic membrane, external ear and ear canal normal. Tympanic membrane is not erythematous, not retracted and not bulging.  Left Ear: Hearing, tympanic membrane, external ear and ear canal normal. Tympanic membrane is not erythematous, not retracted and not bulging.  Nose: No mucosal edema or rhinorrhea. Right sinus exhibits no maxillary sinus tenderness and no frontal sinus tenderness. Left  sinus exhibits no maxillary sinus tenderness and no frontal sinus tenderness.  Mouth/Throat: Uvula is midline, oropharynx is clear and moist and mucous membranes are normal.  Eyes: Conjunctivae, EOM and lids are normal. Pupils are equal, round, and reactive to light. Lids are everted and swept, no foreign bodies found.  Neck: Trachea normal and normal range of motion. Neck supple. Carotid bruit is not present. No thyroid mass and no thyromegaly present.  Cardiovascular: Normal rate, regular rhythm, S1 normal, S2 normal, normal heart sounds, intact distal pulses and normal pulses.  Exam reveals no gallop and no friction rub.   No murmur heard. Pulmonary/Chest: Effort normal and breath sounds normal. No tachypnea. No respiratory distress. She has no decreased breath sounds. She has no wheezes. She has no rhonchi. She has no rales.  Abdominal: Soft. Normal appearance and bowel sounds are normal. There is no tenderness.  Neurological: She is alert.  Skin: Skin is warm, dry and intact. No rash noted.  Psychiatric: Her speech is normal. Thought content normal. Her mood appears anxious. Her affect is labile. She is agitated. Cognition and memory are normal. She expresses inappropriate judgment. She does not exhibit a depressed mood.          Assessment & Plan:

## 2015-04-04 NOTE — Progress Notes (Signed)
Pre visit review using our clinic review tool, if applicable. No additional management support is needed unless otherwise documented below in the visit note. 

## 2015-04-06 ENCOUNTER — Other Ambulatory Visit (INDEPENDENT_AMBULATORY_CARE_PROVIDER_SITE_OTHER): Payer: Managed Care, Other (non HMO)

## 2015-04-06 ENCOUNTER — Telehealth: Payer: Self-pay | Admitting: Family Medicine

## 2015-04-06 DIAGNOSIS — Z13220 Encounter for screening for lipoid disorders: Secondary | ICD-10-CM

## 2015-04-06 LAB — COMPREHENSIVE METABOLIC PANEL
ALBUMIN: 3.9 g/dL (ref 3.5–5.2)
ALK PHOS: 39 U/L (ref 39–117)
ALT: 10 U/L (ref 0–35)
AST: 10 U/L (ref 0–37)
BUN: 16 mg/dL (ref 6–23)
CALCIUM: 9 mg/dL (ref 8.4–10.5)
CHLORIDE: 102 meq/L (ref 96–112)
CO2: 29 mEq/L (ref 19–32)
Creatinine, Ser: 0.81 mg/dL (ref 0.40–1.20)
GFR: 83.44 mL/min (ref >60.00)
GLUCOSE: 104 mg/dL — AB (ref 70–99)
Potassium: 3.9 mEq/L (ref 3.5–5.1)
Sodium: 136 mEq/L (ref 135–145)
Total Bilirubin: 0.4 mg/dL (ref 0.2–1.2)
Total Protein: 7.1 g/dL (ref 6.0–8.3)

## 2015-04-06 LAB — LIPID PANEL
Cholesterol: 137 mg/dL (ref 0–200)
HDL: 30.5 mg/dL — ABNORMAL LOW (ref >39.00)
NonHDL: 106.5
Total CHOL/HDL Ratio: 4
Triglycerides: 236 mg/dL — ABNORMAL HIGH (ref 0.0–149.0)
VLDL: 47.2 mg/dL — AB (ref 0.0–40.0)

## 2015-04-06 LAB — LDL CHOLESTEROL, DIRECT: Direct LDL: 70 mg/dL

## 2015-04-06 NOTE — Telephone Encounter (Signed)
-----   Message from Ellamae Sia sent at 03/31/2015 11:21 AM EDT ----- Regarding: Lab orders for Thursday, 4.14.16 Patient is scheduled for CPX labs, please order future labs, Thanks , Karna Christmas

## 2015-04-11 NOTE — Op Note (Signed)
PATIENT NAME:  Andrea Colon, Andrea Colon MR#:  [id number] DATE OF BIRTH:  16-Nov-1976  DATE OF PROCEDURE:  11/17/2012  PREOPERATIVE DIAGNOSES:  1. Term intrauterine pregnancy at 38 weeks, 6 days gestation.  2. Mild preeclampsia.  3. Gestational diabetes.  4. Nonreassuring fetal surveillance.  POSTOPERATIVE DIAGNOSES:  1. Term intrauterine pregnancy at 38 weeks, 6 days gestation.  2. Mild preeclampsia.  3. Gestational diabetes.  4. Nonreassuring fetal surveillance.  PROCEDURE: Low transverse cesarean section.   SURGEON: Malachy Mood, MD  ASSISTANT: Dalia Heading, CNM  ANESTHESIA: Epidural.  ESTIMATED BLOOD LOSS: 600 mL.  OPERATIVE FLUIDS: 600 mL of crystalloid.   URINE OUTPUT: 100 mL of clear urine.   DRAINS OR TUBES: Foley to gravity drainage, On-Q catheter system.   IMPLANTS: None.   PREOPERATIVE ANTIBIOTICS: 3 grams Ancef.   INTRAOPERATIVE FINDINGS: Normal tubes, uterus and ovaries. Delivery resulted in the birth of a liveborn female infant weighing 4129 grams, 9 pounds 1 ounce. Apgars 9 and 9.   COMPLICATIONS: None.   SPECIMENS REMOVED: Placenta.   PATIENT CONDITION FOLLOWING PROCEDURE: Stable.   PROCEDURE IN DETAIL: The patient had been undergoing induction for mild preeclampsia at term. The patient had made it to completely dilated and was in the process of pushing when she developed recurrent late decelerations. Pitocin was discontinued at this time. The fetus displayed decreased variability and tachycardia into the 170s with no further decelerations noted after stopping pushing and administering tocolysis with terbutaline. However, given the fetal heart rate pattern that was observed, we discussed continuing attempts at proceeding with vaginal delivery with the patient being too high a station to allow for an operative vaginal delivery versus proceeding with primary low transverse cesarean section for delivery. Mutual decision was made to proceed with cesarean section  for delivery. The patient was taken to the Operating Room where her epidural was dosed. She was positioned in the supine position, prepped and draped in the usual sterile fashion. A time out was performed prior to proceeding with the case. The level of anesthetic was checked and noted to be adequate. A Pfannenstiel skin incision was made 2 cm above the pubic symphysis, carried down sharply to the level of the rectus fascia, which was incised in the midline with the knife. The fascial incision was extended using Mayo scissors. The superior border of the rectus fascia was grasped with two Kocher clamps and the underlying rectus muscle was dissected bluntly, dissected off the rectus fascia, and the median raphe was excised using Mayo scissors. The inferior border of the rectus fascia was dissected off the rectus muscle in a similar fashion. The midline was identified, the peritoneum was entered bluntly, and the peritoneal incision was extended using manual traction. A bladder blade was placed. A bladder flap was then created using Metzenbaum scissors and then further developed digitally. The bladder blade was replaced, hysterotomy incision was made low transverse on the uterus, and the uterus was entered bluntly using the operator's finger. The hysterotomy incision was extended using manual traction. After placing the operator's hands in the hysterotomy incision the infant was noted to be in the OA position. The vertex was grasped, flexed, and brought to the incision and delivered atraumatically using fundal pressure. The infant was suctioned, cord was clamped and cut, and the infant was passed to the awaiting pediatricians  The infant was delivered atraumatically using fundal pressure; was suctioned, cord was clamped and cut and the infant was passed to awaiting pediatrician. Placenta was delivered using manual extraction.  The uterus was exteriorized and the hysterotomy incision was closed using a two layer closure  of 0 Vicryl with the first layer being a running locked, the second a vertical imbricating. Following this, the pelvis was irrigated using warm saline. The uterus was returned to the abdomen. The hysterotomy incision was inspected and noted to be hemostatic. The peritoneum was closed using a running 2-0 Vicryl in a running fashion. The On-Q catheter system was placed in the usual fashion. The fascial incision was then closed using a running #1 looped PDS. The subcutaneous tissue was irrigated. Hemostasis achieved using the Bovie. Subcutaneous dead space was closed using a large chromic needle. Following this the skin was closed using staples. The On-Q catheters were flushed with 1% bupivacaine 5 mL each. Sponge, needle and instrument counts were correct x2. The patient tolerated the procedure well and was taken to recovery room in stable condition. ____________________________ Stoney Bang. Georgianne Fick, MD ams:slb D: 11/17/2012 11:46:27 ET T: 11/17/2012 12:06:13 ET JOB#: [id number]  cc: Stoney Bang. Georgianne Fick, MD, <Dictator> Conan Bowens Madelon Lips MD ELECTRONICALLY SIGNED 11/26/2012 15:41

## 2015-04-11 NOTE — Op Note (Signed)
  Dorthula Nettles MD ELECTRONICALLY SIGNED 11/26/2012 15:42

## 2015-04-13 ENCOUNTER — Encounter: Payer: Self-pay | Admitting: Family Medicine

## 2015-04-13 ENCOUNTER — Ambulatory Visit (INDEPENDENT_AMBULATORY_CARE_PROVIDER_SITE_OTHER): Payer: Managed Care, Other (non HMO) | Admitting: Family Medicine

## 2015-04-13 ENCOUNTER — Other Ambulatory Visit (HOSPITAL_COMMUNITY)
Admission: RE | Admit: 2015-04-13 | Discharge: 2015-04-13 | Disposition: A | Discharge status: Home / Self Care | Payer: Managed Care, Other (non HMO) | Source: Ambulatory Visit | Attending: Family Medicine | Admitting: Family Medicine

## 2015-04-13 VITALS — BP 100/68 | HR 63 | Temp 98.0°F | Ht 64.0 in | Wt 219.0 lb

## 2015-04-13 DIAGNOSIS — R7309 Other abnormal glucose: Secondary | ICD-10-CM | POA: Diagnosis not present

## 2015-04-13 DIAGNOSIS — F411 Generalized anxiety disorder: Secondary | ICD-10-CM

## 2015-04-13 DIAGNOSIS — R7303 Prediabetes: Secondary | ICD-10-CM

## 2015-04-13 DIAGNOSIS — Z01419 Encounter for gynecological examination (general) (routine) without abnormal findings: Secondary | ICD-10-CM | POA: Diagnosis not present

## 2015-04-13 DIAGNOSIS — Z124 Encounter for screening for malignant neoplasm of cervix: Secondary | ICD-10-CM | POA: Diagnosis not present

## 2015-04-13 DIAGNOSIS — Z1151 Encounter for screening for human papillomavirus (HPV): Secondary | ICD-10-CM | POA: Insufficient documentation

## 2015-04-13 DIAGNOSIS — Z0000 Encounter for general adult medical examination without abnormal findings: Secondary | ICD-10-CM | POA: Diagnosis not present

## 2015-04-13 MED ORDER — METOPROLOL TARTRATE 25 MG PO TABS: 25.0000 mg | ORAL_TABLET | Freq: Two times a day (BID) | ORAL | Status: DC

## 2015-04-13 NOTE — Addendum Note (Signed)
Addended by: Carter Kitten on: 04/13/2015 10:56 AM   Modules accepted: Orders

## 2015-04-13 NOTE — Progress Notes (Signed)
Subjective:    Patient ID: Andrea Colon, female    DOB: 07-04-1976, 39 y.o.   MRN: [id number]  HPI  The patient is here for annual wellness exam and preventative care.    Recently seen on 4/12 for follow up palpitations and increase in anxiety.  At that [place] she was started on sertraline 50 mg daily for GAD, panic attacks.  Her metoprolol was increased to 25 mg BID.  Today she reports she is still very anxious, but less tearful. No SI.Marland Kitchen  She feels that setraline makes her nauseous but it is getting better. She feels very shaky.  She has had some improvement in palpitations during the day, but still having episode at night lasting a few hours. HR 60-70 She has decreased clonazepam on her own, She is now only taking 0.25 mg at bedtime.   Hypertension:   On losartan HCTZ and metoprolol. BP Readings from Last 3 Encounters:  04/13/15 100/68  04/04/15 104/70  03/28/15 118/82  Using medication without problems or lightheadedness:  None  Chest pain with exertion:None EdemaNone Short of :breath:None Average home BPs: 100/60s Other issues:  Prediabetes: Exercise: none currently except going for walks  Diet: Moderate, water   Review of Systems  Constitutional: Negative for fever and fatigue.  HENT: Negative for ear pain.   Eyes: Negative for pain.  Respiratory: Negative for chest tightness and shortness of breath.   Cardiovascular: Positive for palpitations. Negative for chest pain and leg swelling.  Gastrointestinal: Negative for abdominal pain.  Genitourinary: Negative for dysuria.       Objective:   Physical Exam  Constitutional: Vital signs are normal. She appears well-developed and well-nourished. She is cooperative.  Non-toxic appearance. She does not appear ill. No distress.  HENT:  Head: Normocephalic.  Right Ear: Hearing, tympanic membrane, external ear and ear canal normal.  Left Ear: Hearing, tympanic membrane, external ear and ear canal normal.  Nose: Nose  normal.  Eyes: Conjunctivae, EOM and lids are normal. Pupils are equal, round, and reactive to light. Lids are everted and swept, no foreign bodies found.  Neck: Trachea normal and normal range of motion. Neck supple. Carotid bruit is not present. No thyroid mass and no thyromegaly present.  Cardiovascular: Normal rate, regular rhythm, S1 normal, S2 normal, normal heart sounds and intact distal pulses.  Exam reveals no gallop.   No murmur heard. Pulmonary/Chest: Effort normal and breath sounds normal. No respiratory distress. She has no wheezes. She has no rhonchi. She has no rales.  Abdominal: Soft. Normal appearance and bowel sounds are normal. She exhibits no distension, no fluid wave, no abdominal bruit and no mass. There is no hepatosplenomegaly. There is no tenderness. There is no rebound, no guarding and no CVA tenderness. No hernia.  Genitourinary: Vagina normal and uterus normal. No breast swelling, tenderness, discharge or bleeding. Pelvic exam was performed with patient supine. There is no rash, tenderness or lesion on the right labia. There is no rash, tenderness or lesion on the left labia. Uterus is not enlarged and not tender. Cervix exhibits no motion tenderness, no discharge and no friability. Right adnexum displays no mass, no tenderness and no fullness. Left adnexum displays no mass, no tenderness and no fullness.  Lymphadenopathy:    She has no cervical adenopathy.    She has no axillary adenopathy.  Neurological: She is alert. She has normal strength. No cranial nerve deficit or sensory deficit.  Skin: Skin is warm, dry and intact. No rash noted.  Psychiatric: Her speech is normal and behavior is normal. Judgment normal. Her mood appears not anxious. Cognition and memory are normal. She does not exhibit a depressed mood.          Assessment & Plan:  The patient's preventative maintenance and recommended screening tests for an annual wellness exam were reviewed in full  today. Brought up to date unless services declined.  Counselled on the importance of diet, exercise, and its role in overall health and mortality. The patient's FH and SH was reviewed, including their home life, tobacco status, and drug and alcohol status.   Vaccines: Last Tdap in 2013 STD screen: refused Colon or mammo: no early family history. PAP/DVE: due

## 2015-04-13 NOTE — Assessment & Plan Note (Signed)
Encouraged exercise, weight loss, healthy eating habits. ? ?

## 2015-04-13 NOTE — Progress Notes (Signed)
Pre visit review using our clinic review tool, if applicable. No additional management support is needed unless otherwise documented below in the visit note. 

## 2015-04-13 NOTE — Patient Instructions (Addendum)
Given sertraline longer to work. Can wean off clonazepam further.  Continue metoprolol at current dose.  Follow up in 3-4 weeks for re-eval mood/palpitaitons. Low carb diet

## 2015-04-13 NOTE — Assessment & Plan Note (Signed)
No improvement but none expected at this time given on sertraline x 10 days.  Pt worried clonazepam causing issues, she will wean off this.

## 2015-04-14 LAB — CYTOLOGY - PAP

## 2015-04-15 NOTE — Op Note (Signed)
PATIENT NAME:  Andrea Colon, Andrea Colon MR#:  [id number] DATE OF BIRTH:  Jan 08, 1976  DATE OF PROCEDURE:  03/31/2014  PREOPERATIVE DIAGNOSIS: Chronic cholecystitis and cholelithiasis.   POSTOPERATIVE DIAGNOSIS:  Chronic cholecystitis and cholelithiasis.  OPERATIVE PROCEDURE: Laparoscopic cholecystectomy with intraoperative cholangiograms.   SURGEON: Hervey Ard, M.D.   ANESTHESIA: General endotracheal.   ESTIMATED BLOOD LOSS: 25 mL.   CLINICAL NOTE: This 39 year old woman has had episodic abdominal pain and ultrasound showed evidence of cholelithiasis. She was felt to be a candidate for elective cholecystectomy.   OPERATIVE NOTE: With the patient under adequate general endotracheal anesthesia, the abdomen was prepped with ChloraPrep and draped. In Trendelenburg position, a Veress needle was placed through a transumbilical incision. After assuring intra-abdominal location with the hanging drop test, the abdomen was insufflated with CO2 at 10 mmHg pressure. A 10 mm step port was expanded and inspection showed no evidence of injury from initial port placement. In reverse Trendelenburg position, an 11 mm Xcel port was placed in the epigastrium and two 5 mm step ports placed on the right lateral abdominal wall. The gallbladder was placed on cephalad traction. Adhesions of the omentum to the gallbladder were taken down with cautery dissection. The neck of the gallbladder was cleared and fluoroscopic cholangiograms completed using one half strength Conray 60, 11 mL volume. This showed prompt flow into the right and left hepatic ducts and free flow into the duodenum. The cystic duct and cystic artery were doubly clipped and divided. The gallbladder was removed from the liver bed making use of hook cautery dissection. At the very fundus of the gallbladder, a small less than 1 cm rent in the hepatic capsule produced troublesome bleeding requiring the application of Surgicel. The gallbladder was delivered through  the umbilical port site. Inspection showed no evidence of injury from initial port placement. The right upper quadrant was irrigated with lactated Ringer's solution. Pressure was held over the exposed hepatic parenchyma with underlying Surgicel and subsequent cessation of bleeding. The abdomen was then desufflated and ports removed under direct vision. The umbilical fascia was approximated with 0 Vicryl. The skin incisions were closed with 4-0 Vicryl subcuticular suture. Benzoin, Steri-Strips, Telfa and Tegaderm dressing was then applied.   The patient tolerated the procedure well and was taken to the recovery room in stable condition.     ____________________________ Robert Bellow, MD jwb:dmm D: 04/01/2014 22:01:54 ET T: 04/01/2014 22:59:09 ET JOB#: [id number]  cc: Robert Bellow, MD, <Dictator> JEFFREY Amedeo Kinsman MD ELECTRONICALLY SIGNED 04/03/2014 19:57

## 2015-04-17 ENCOUNTER — Encounter: Payer: Self-pay | Admitting: *Deleted

## 2015-04-20 ENCOUNTER — Encounter: Payer: Self-pay | Admitting: Family Medicine

## 2015-04-20 MED ORDER — LOSARTAN POTASSIUM-HCTZ 50-12.5 MG PO TABS: 2.0000 | ORAL_TABLET | Freq: Every day | ORAL | Status: DC

## 2015-05-02 NOTE — H&P (Signed)
L&D Evaluation:  History:   HPI 39yo G1 at [redacted]w[redacted]d gestational age by 1st trimester ultrasound whose pregnancy has been complicated by chronic hypertension not requiring medication, anxiety requiring zoloft and klonopin during the early part of her pregnancy, both of which have now been weaned off.  She is also AMA.  She presented to the office yesterday and had elevated BPs to 140's/100.  She was sent to L&D for futher evaluation and had P/C ratio of 298, set up for IOL with cervidil for tonight. She presents without headache, changes in vision, and without right upper quadrant pain. She notes positive fetal movement, denies vaginal bleeding, leakage of fluid, and contractions.   Blood type O+, HBsAg neg, VZEQ, RPR NR, GBS neg (11/8). Elevated 1 hour GCT, 3 hour had one abnormal    Presents with IOL due to chronic HTN    Patient's Medical History Hypertension  anxiety, hyperlipidemia    Patient's Surgical History Appendectomy  D&C  eye surgery    Medications Pre Natal Vitamins    Allergies Sulfa    Social History none    Family History Non-Contributory   ROS:   ROS All systems were reviewed.  HEENT, CNS, GI, GU, Respiratory, CV, Renal and Musculoskeletal systems were found to be normal., unless noted in HPI   Exam:   Vital Signs 181/84, then 149/73, 150/81    Urine Protein UPC ratio = 298 yesterday    General no apparent distress    Mental Status clear    Chest clear    Heart normal sinus rhythm    Abdomen gravid, non-tender    Estimated Fetal Weight Average for gestational age, 7.5 pounds    Fetal Position cephalic    Back no CVAT    Edema Pitting  trace    Reflexes 2+    Clonus negative    Pelvic finger tip per check in clinic yesterday    Mebranes Intact    FHT normal rate with no decels    Ucx absent    Skin no lesions    Lymph no lymphadenopathy    Other HELLP labs: all normal   Impression:   Impression chronic hypertension with worsening blood  pressures and possible development of preeclampsia, IOL   Plan:   Plan UA, EFM/NST, monitor BP    Comments Note from Dr Glennon Mac on 11/22 when pt came to L&D to be evaluated for increased BP: "Discussed my recommendation to induce patient at this time with a diagnosis of, at a minimum, chronic hypertension without control on no medication (see ACOG committee opinion regarding indication and timing of late preterm and early term deliveries).  The patient very strongly desired to go home and pack and return tomorrow for induction.  Given the fact that most of her blood pressures were in the normal range and that her UPC ratio was 298 and to diagnose preeclampsia I would need a full 24-hour total protein collection, it seems reasonable to have her return 11/23 for induction. I gave her strict precautions regarding signs and symptoms of superimposed preeclampsia."    Follow Up Appointment already scheduled   Electronic Signatures: Shann Medal (CNM)  (Signed [phone] 21:54)  Authored: L&D Evaluation   Last Updated: 23-Nov-13 21:54 by Shann Medal (CNM)

## 2015-05-02 NOTE — Assessment & Plan Note (Signed)
Likely worsened by anxiety.  Increase metoprolol.

## 2015-05-02 NOTE — H&P (Signed)
L&D Evaluation:  History:   HPI 39yo G1 at [redacted]w[redacted]d gestational age by 1st trimester ultrasound whose pregnancy has been complicated by chronic hypertension not requiring medication, anxiety requiring zoloft and klonopin during the early part of her pregnancy, both of which have now been weaned off.  She is also AMA.  She presented to the office today and had elevated BPs to 140's/100.  She was sent to L&D for futher evaluation. She presents without headache, changes in vision, and without right upper quadrant pain. She notes positive fetal movement, denies vaginal bleeding, leakage of fluid, and contractions.  She had a negative urine dipstick in the office today.  Blood type O+, HBsAg neg, VZEQ, RPR NR, GBS neg (11/8).    Patient's Medical History Hypertension  anxiety, hyperlipidemia    Patient's Surgical History Appendectomy  D&C  eye surgery    Medications Pre Natal Vitamins    Allergies Sulfa    Social History none    Family History Non-Contributory   ROS:   ROS All systems were reviewed.  HEENT, CNS, GI, GU, Respiratory, CV, Renal and Musculoskeletal systems were found to be normal., unless noted in HPI   Exam:   Vital Signs SBP 131-154 (most <140), DBP 75-90 (one  reading of 90)    Urine Protein UPC ratio = 298    General no apparent distress    Mental Status clear    Chest clear    Heart normal sinus rhythm    Abdomen gravid, non-tender    Estimated Fetal Weight Average for gestational age, 7.5 pounds    Fetal Position cephalic    Back no CVAT    Edema Pitting  trace    Reflexes 2+    Clonus negative    Pelvic finger tip per check in clinic today    Mebranes Intact    Ucx absent    Skin no lesions    Lymph no lymphadenopathy    Other HELLP labs: all normal   Impression:   Impression chronic hypertension with worsening blood pressures and possible development of preeclampsia   Plan:   Plan UA, EFM/NST, monitor BP    Comments Discussed my  recommendation to induce patient at this time with a diagnosis of, at a minimum, chronic hypertension without control on no medication (see ACOG committee opinion regarding indication and timing of late preterm and early term deliveries).  The patient very strongly desired to go home and pack and return tomorrow for induction.  Given the fact that most of her blood pressures were in the normal range and that her UPC ratio was 298 and to diagnose preeclampsia I would need a full 24-hour total protein collection, it seems reasonable to have her return tomorrow for induction. I gave her strict precautions regarding signs and symptoms of superimposed preeclampsia.    Follow Up Appointment already scheduled   Electronic Signatures: Will Bonnet (MD)  (Signed [phone] 22:30)  Authored: L&D Evaluation   Last Updated: 22-Nov-13 22:30 by Will Bonnet (MD)

## 2015-05-02 NOTE — Assessment & Plan Note (Signed)
Start sertraline 50 mg at bedtime. Continue healthy lifestyle. Continue clonazepam for now.

## 2015-05-05 ENCOUNTER — Ambulatory Visit (INDEPENDENT_AMBULATORY_CARE_PROVIDER_SITE_OTHER): Payer: Managed Care, Other (non HMO) | Admitting: Family Medicine

## 2015-05-05 ENCOUNTER — Encounter: Payer: Self-pay | Admitting: Family Medicine

## 2015-05-05 VITALS — BP 110/72 | HR 82 | Temp 98.5°F | Ht 64.0 in | Wt 218.0 lb

## 2015-05-05 DIAGNOSIS — F411 Generalized anxiety disorder: Secondary | ICD-10-CM | POA: Diagnosis not present

## 2015-05-05 DIAGNOSIS — R002 Palpitations: Secondary | ICD-10-CM | POA: Diagnosis not present

## 2015-05-05 NOTE — Progress Notes (Signed)
Subjective:     Patient ID: Andrea Colon, female   DOB: 1976/04/17, 39 y.o.   MRN: [id number]  HPI Andrea Colon is a 39 y/o F presenting for follow up of GAD.  Andrea Colon was seen on 4/12 for palpitations and increase in anxiety. Was started on sertraline 50 mg daily and metoprolol was increased to 25 mg BID. Was taking clonazepam 0.25 mg at bedtime at that time.  Andrea Colon was seen on 4/21, planned to wean off clonazepam at that time.  Andrea Colon feels like the sertraline is helping some with anxiety. Sometimes feels less anxious, but still feeling very anxious overall. No SE from the sertraline. Has weaned off the clonazepam, not sure if this had made her feel any better. Having issues with sleep as well.  Still has some palpitations, Andrea Colon says they come and go. Some improvement. Stopped taking metoprolol a couple weeks ago, felt like it was not helping and says that it made her feel "weird."   Hypertension:    Using medication without problems or lightheadedness: Currently taking Hyzaar  Chest pain with exertion: None Edema: None Short of breath: None Average home BPs: doesn't check at home Other issues: none BP Readings from Last 3 Encounters:  05/05/15 110/72  04/13/15 100/68  04/04/15 104/70      Review of Systems  Constitutional: Negative for fever and fatigue.  HENT: Negative for congestion and sore throat.   Respiratory: Positive for chest tightness. Negative for cough and shortness of breath.   Cardiovascular: Positive for palpitations. Negative for chest pain and leg swelling.  Gastrointestinal: Negative for nausea, vomiting, abdominal pain, diarrhea and constipation.  Psychiatric/Behavioral: Positive for sleep disturbance. The patient is nervous/anxious.        Objective:   Physical Exam  Constitutional: Andrea Colon is oriented to person, place, and time. Andrea Colon appears well-developed and well-nourished.  tearful  HENT:  Head: Normocephalic and atraumatic.  Neck: Carotid bruit is not  present. No thyroid mass and no thyromegaly present.  Cardiovascular: Normal rate, regular rhythm, normal heart sounds and intact distal pulses.  Exam reveals no gallop and no friction rub.   No murmur heard. Pulmonary/Chest: Effort normal and breath sounds normal.  Neurological: Andrea Colon is alert and oriented to person, place, and time.  Skin: Skin is warm, dry and intact.  Psychiatric: Her mood appears anxious. Her speech is rapid and/or pressured. Andrea Colon is agitated. Cognition and memory are normal. Andrea Colon expresses impulsivity.       Assessment:     1. GAD: Not controlled, still very anxious.  2. Palpitations: Improving off metoprolol. Likely related to GAD.    Plan:     1. GAD: Increase sertraline to 100 mg and follow up in 1 month. Discussed potential benefit of seeing a counselor or psychologist. Provided pt referral to counseling service here.  2. Palpitations: Advised pt to use metoprolol PRN.     Dante Gang served as Education administrator for Dr. Diona Browner 05/05/15 10:30 am  Patient seen and examined. Med student acted as Education administrator only.  HPI/ROS/PE and assessment/plan created  By MD and scribed by med student.  Eliezer Lofts MD

## 2015-05-05 NOTE — Patient Instructions (Signed)
Increase sertraline to 100 mg daily and follow up in 4 weeks Take metoprolol as needed for palpitations  Stop at front desk for referral to counselor Work on stress reduction and relaxation

## 2015-05-05 NOTE — Progress Notes (Signed)
Pre visit review using our clinic review tool, if applicable. No additional management support is needed unless otherwise documented below in the visit note. 

## 2015-05-09 ENCOUNTER — Ambulatory Visit: Payer: Managed Care, Other (non HMO) | Admitting: Family Medicine

## 2015-05-09 ENCOUNTER — Ambulatory Visit (INDEPENDENT_AMBULATORY_CARE_PROVIDER_SITE_OTHER): Payer: Managed Care, Other (non HMO) | Admitting: Psychology

## 2015-05-09 DIAGNOSIS — F411 Generalized anxiety disorder: Secondary | ICD-10-CM | POA: Diagnosis not present

## 2015-05-15 ENCOUNTER — Encounter: Payer: Self-pay | Admitting: Family Medicine

## 2015-05-16 ENCOUNTER — Encounter: Payer: Self-pay | Admitting: Family Medicine

## 2015-05-16 ENCOUNTER — Ambulatory Visit (INDEPENDENT_AMBULATORY_CARE_PROVIDER_SITE_OTHER): Payer: Managed Care, Other (non HMO) | Admitting: Psychology

## 2015-05-16 DIAGNOSIS — F4323 Adjustment disorder with mixed anxiety and depressed mood: Secondary | ICD-10-CM | POA: Diagnosis not present

## 2015-05-16 MED ORDER — SERTRALINE HCL 100 MG PO TABS: 100.0000 mg | ORAL_TABLET | Freq: Every day | ORAL | Status: DC

## 2015-05-16 MED ORDER — SERTRALINE HCL 50 MG PO TABS
100.0000 mg | ORAL_TABLET | Freq: Every day | ORAL | Status: DC
Start: 2015-05-16 — End: 2015-05-16

## 2015-05-16 NOTE — Addendum Note (Signed)
Addended by: Carter Kitten on: 05/16/2015 04:11 PM   Modules accepted: Orders

## 2015-05-31 ENCOUNTER — Ambulatory Visit (INDEPENDENT_AMBULATORY_CARE_PROVIDER_SITE_OTHER): Payer: Managed Care, Other (non HMO) | Admitting: Psychology

## 2015-05-31 DIAGNOSIS — F4323 Adjustment disorder with mixed anxiety and depressed mood: Secondary | ICD-10-CM | POA: Diagnosis not present

## 2015-06-06 ENCOUNTER — Encounter: Payer: Self-pay | Admitting: Family Medicine

## 2015-06-06 ENCOUNTER — Ambulatory Visit (INDEPENDENT_AMBULATORY_CARE_PROVIDER_SITE_OTHER): Payer: Managed Care, Other (non HMO) | Admitting: Family Medicine

## 2015-06-06 VITALS — BP 114/76 | HR 76 | Temp 98.5°F | Ht 64.0 in | Wt 218.0 lb

## 2015-06-06 DIAGNOSIS — R002 Palpitations: Secondary | ICD-10-CM

## 2015-06-06 DIAGNOSIS — F411 Generalized anxiety disorder: Secondary | ICD-10-CM | POA: Diagnosis not present

## 2015-06-06 NOTE — Progress Notes (Signed)
Pre visit review using our clinic review tool, if applicable. No additional management support is needed unless otherwise documented below in the visit note. 

## 2015-06-06 NOTE — Assessment & Plan Note (Signed)
Decreasing in frequency with improvement in mood.

## 2015-06-06 NOTE — Assessment & Plan Note (Signed)
Improving control on sertraline 100 mg daily.

## 2015-06-06 NOTE — Progress Notes (Signed)
   Subjective:    Patient ID: Andrea Colon, female    DOB: [birth year], 39 y.o.   MRN: [id number]  HPI   39 year old female presents for follow up generalized anxiety and associated palpitations.  GAD: on sertraline 100 mg daily for 1  now. Referred to therapist. She has been seeing her once a week.   She feels like she is doing better overall.  At least 50 % better. Sleeping at night. No longer having vibrating feeling at night. NO SI no HI.   BP Readings from Last 3 Encounters:  06/06/15 114/76  05/05/15 110/72  04/13/15 100/68  HR 76 today in office.  Palpitations: Less heart fluttering, deep breathing improving it.  On metopolol 25 mg  BID prn, but has only used 1-2 times in last 4 weeks.Marland Kitchen  Referred to cardiology, but she did not go.  110 days since quitting smoking.    Review of Systems  Constitutional: Negative for fatigue.  Respiratory: Negative for shortness of breath.   Cardiovascular: Positive for palpitations. Negative for chest pain and leg swelling.       Objective:   Physical Exam  Constitutional: Vital signs are normal. She appears well-developed and well-nourished. She is cooperative.  Non-toxic appearance. She does not appear ill. No distress.  HENT:  Head: Normocephalic.  Right Ear: Hearing, tympanic membrane, external ear and ear canal normal. Tympanic membrane is not erythematous, not retracted and not bulging.  Left Ear: Hearing, tympanic membrane, external ear and ear canal normal. Tympanic membrane is not erythematous, not retracted and not bulging.  Nose: No mucosal edema or rhinorrhea. Right sinus exhibits no maxillary sinus tenderness and no frontal sinus tenderness. Left sinus exhibits no maxillary sinus tenderness and no frontal sinus tenderness.  Mouth/Throat: Uvula is midline, oropharynx is clear and moist and mucous membranes are normal.  Eyes: Conjunctivae, EOM and lids are normal. Pupils are equal, round, and reactive to light. Lids are  everted and swept, no foreign bodies found.  Neck: Trachea normal and normal range of motion. Neck supple. Carotid bruit is not present. No thyroid mass and no thyromegaly present.  Cardiovascular: Normal rate, regular rhythm, S1 normal, S2 normal, normal heart sounds, intact distal pulses and normal pulses.  Exam reveals no gallop and no friction rub.   No murmur heard. Pulmonary/Chest: Effort normal and breath sounds normal. No tachypnea. No respiratory distress. She has no decreased breath sounds. She has no wheezes. She has no rhonchi. She has no rales.  Abdominal: Soft. Normal appearance and bowel sounds are normal. There is no tenderness.  Neurological: She is alert.  Skin: Skin is warm, dry and intact. No rash noted.  Psychiatric: Her speech is normal and behavior is normal. Judgment and thought content normal. Her mood appears not anxious. Cognition and memory are normal. She does not exhibit a depressed mood.          Assessment & Plan:

## 2015-06-06 NOTE — Patient Instructions (Signed)
Keep up the great work . Continue what you are doing, stress reduction etc. Continue sertraline.

## 2015-06-07 ENCOUNTER — Ambulatory Visit (INDEPENDENT_AMBULATORY_CARE_PROVIDER_SITE_OTHER): Payer: Managed Care, Other (non HMO) | Admitting: Psychology

## 2015-06-07 DIAGNOSIS — F4323 Adjustment disorder with mixed anxiety and depressed mood: Secondary | ICD-10-CM | POA: Diagnosis not present

## 2015-06-21 ENCOUNTER — Ambulatory Visit (INDEPENDENT_AMBULATORY_CARE_PROVIDER_SITE_OTHER): Payer: Managed Care, Other (non HMO) | Admitting: Psychology

## 2015-06-21 DIAGNOSIS — F4323 Adjustment disorder with mixed anxiety and depressed mood: Secondary | ICD-10-CM | POA: Diagnosis not present

## 2015-07-05 ENCOUNTER — Ambulatory Visit: Payer: Managed Care, Other (non HMO) | Admitting: Psychology

## 2015-07-07 ENCOUNTER — Encounter: Payer: Self-pay | Admitting: Family Medicine

## 2015-07-07 ENCOUNTER — Ambulatory Visit (INDEPENDENT_AMBULATORY_CARE_PROVIDER_SITE_OTHER): Payer: Managed Care, Other (non HMO) | Admitting: Family Medicine

## 2015-07-07 VITALS — BP 112/60 | HR 72 | Temp 97.9°F | Ht 64.0 in | Wt 220.5 lb

## 2015-07-07 DIAGNOSIS — N760 Acute vaginitis: Secondary | ICD-10-CM | POA: Diagnosis not present

## 2015-07-07 DIAGNOSIS — R002 Palpitations: Secondary | ICD-10-CM

## 2015-07-07 DIAGNOSIS — F411 Generalized anxiety disorder: Secondary | ICD-10-CM

## 2015-07-07 LAB — POCT WET PREP (WET MOUNT)
KOH Wet Prep POC: NEGATIVE
TRICHOMONAS WET PREP HPF POC: NEGATIVE

## 2015-07-07 MED ORDER — FLUCONAZOLE 150 MG PO TABS: 150.0000 mg | ORAL_TABLET | Freq: Once | ORAL | Status: DC

## 2015-07-07 NOTE — Assessment & Plan Note (Signed)
Likely partially treat yeast infection . Treat with diflucan x 1 .

## 2015-07-07 NOTE — Progress Notes (Signed)
39 year old female presents for follow up generalized anxiety and associated palpitations.  GAD: on sertraline 100 mg daily for 2 months now. Referred to therapist. She has been seeing her once a week.  She feels like she is doing even better overall. At least 75 % better. Sleeping moderately well night (wake up but can go back to sleep).  Melatonin helps her fall asleep. No longer having vibrating feeling at night.  No SI no HI.  BP Readings from Last 3 Encounters:  07/07/15 112/60  06/06/15 114/76  05/05/15 110/72   HR 72 in office today  Palpitations: Has waves of heart fluttering off and on. Less heart fluttering, deep breathing improving it. On metopolol 25 mg BID prn, but has not used in last month. No new chest pain, no SOB.  5 months since quitting smoking.  Exercise: none   She has had yeast infection off and on in last few months. OTC meds helped but then returned. Vaginal itching, no discharge, no odor.  No fever, no rash, no ulcers.  No dysuria.  No recent antibiotics.    Review of Systems  Constitutional: Negative for fatigue.  Respiratory: Negative for shortness of breath.  Cardiovascular: Positive for palpitations. Negative for chest pain and leg swelling.       Objective:   Physical Exam  Constitutional: Vital signs are normal. She appears well-developed and well-nourished. She is cooperative. Non-toxic appearance. She does not appear ill. No distress.  HENT:  Head: Normocephalic.  Right Ear: Hearing, tympanic membrane, external ear and ear canal normal. Tympanic membrane is not erythematous, not retracted and not bulging.  Left Ear: Hearing, tympanic membrane, external ear and ear canal normal. Tympanic membrane is not erythematous, not retracted and not bulging.  Nose: No mucosal edema or rhinorrhea. Right sinus exhibits no maxillary sinus tenderness and no frontal sinus tenderness. Left sinus exhibits no maxillary sinus tenderness and no  frontal sinus tenderness.  Mouth/Throat: Uvula is midline, oropharynx is clear and moist and mucous membranes are normal.  Eyes: Conjunctivae, EOM and lids are normal. Pupils are equal, round, and reactive to light. Lids are everted and swept, no foreign bodies found.  Neck: Trachea normal and normal range of motion. Neck supple. Carotid bruit is not present. No thyroid mass and no thyromegaly present.  Cardiovascular: Normal rate, regular rhythm, S1 normal, S2 normal, normal heart sounds, intact distal pulses and normal pulses. Exam reveals no gallop and no friction rub.  No murmur heard. Pulmonary/Chest: Effort normal and breath sounds normal. No tachypnea. No respiratory distress. She has no decreased breath sounds. She has no wheezes. She has no rhonchi. She has no rales.  Abdominal: Soft. Normal appearance and bowel sounds are normal. There is no tenderness.  Neurological: She is alert.  Skin: Skin is warm, dry and intact. No rash noted.  Psychiatric: Her speech is normal and behavior is normal. Judgment and thought content normal. Her mood appears not anxious. Cognition and memory are normal. She does not exhibit a depressed mood.   GU: no erythema, white thick mucusy discharge   wet prep neg.

## 2015-07-07 NOTE — Patient Instructions (Addendum)
Add back exercise as able. Take diflucan x 1 for yeast infection.  Call if not improving as expected.

## 2015-07-07 NOTE — Progress Notes (Signed)
Pre visit review using our clinic review tool, if applicable. No additional management support is needed unless otherwise documented below in the visit note. 

## 2015-07-07 NOTE — Assessment & Plan Note (Signed)
Continue sertraline 100 mg daily as improvement continued with mood.  encouraged pt to start back exercise.

## 2015-07-07 NOTE — Assessment & Plan Note (Signed)
Improved with improvement in mood. 

## 2015-07-18 ENCOUNTER — Ambulatory Visit: Payer: Managed Care, Other (non HMO) | Admitting: Psychology

## 2015-07-26 ENCOUNTER — Ambulatory Visit (INDEPENDENT_AMBULATORY_CARE_PROVIDER_SITE_OTHER): Payer: Managed Care, Other (non HMO) | Admitting: Psychology

## 2015-07-26 DIAGNOSIS — F4323 Adjustment disorder with mixed anxiety and depressed mood: Secondary | ICD-10-CM | POA: Diagnosis not present

## 2015-08-09 ENCOUNTER — Telehealth: Payer: Self-pay | Admitting: Family Medicine

## 2015-08-09 ENCOUNTER — Ambulatory Visit (INDEPENDENT_AMBULATORY_CARE_PROVIDER_SITE_OTHER): Payer: Managed Care, Other (non HMO) | Admitting: Psychology

## 2015-08-09 DIAGNOSIS — F4323 Adjustment disorder with mixed anxiety and depressed mood: Secondary | ICD-10-CM

## 2015-08-09 DIAGNOSIS — R002 Palpitations: Secondary | ICD-10-CM

## 2015-08-09 NOTE — Telephone Encounter (Signed)
Pt requesting another referral to Cardiology for palpitations. She originally wanted [place] to cancel 1st referral b/c she thought it was more anxiety. Now she does not think it's anxiety. Please advise.    Pt wants [place] Not Mondays Early morning appt [phone] or direct work line [phone]

## 2015-08-10 NOTE — Telephone Encounter (Signed)
Pt called back checking on referral

## 2015-08-11 NOTE — Telephone Encounter (Signed)
Referral order sent 

## 2015-08-11 NOTE — Telephone Encounter (Signed)
Scheduled 10/13/15

## 2015-08-13 ENCOUNTER — Other Ambulatory Visit: Payer: Self-pay | Admitting: Family Medicine

## 2015-08-22 ENCOUNTER — Ambulatory Visit (INDEPENDENT_AMBULATORY_CARE_PROVIDER_SITE_OTHER): Payer: Managed Care, Other (non HMO) | Admitting: Cardiovascular Disease

## 2015-08-22 ENCOUNTER — Encounter: Payer: Self-pay | Admitting: Cardiovascular Disease

## 2015-08-22 VITALS — BP 130/82 | HR 74 | Ht 64.0 in | Wt 227.8 lb

## 2015-08-22 DIAGNOSIS — R002 Palpitations: Secondary | ICD-10-CM | POA: Diagnosis not present

## 2015-08-22 DIAGNOSIS — I493 Ventricular premature depolarization: Secondary | ICD-10-CM

## 2015-08-22 DIAGNOSIS — I10 Essential (primary) hypertension: Secondary | ICD-10-CM

## 2015-08-22 HISTORY — DX: Ventricular premature depolarization: I49.3

## 2015-08-22 NOTE — Assessment & Plan Note (Signed)
Blood pressure is controlled on losartan/hydrochlorothiazide.

## 2015-08-22 NOTE — Patient Instructions (Signed)
Medication Instructions:  Your physician recommends that you continue on your current medications as directed. Please refer to the Current Medication list given to you today.   Labwork: none  Testing/Procedures: Your physician has requested that you have an echocardiogram. Echocardiography is a painless test that uses sound waves to create images of your heart. It provides your doctor with information about the size and shape of your heart and how well your heart's chambers and valves are working. This procedure takes approximately one hour. There are no restrictions for this procedure.  Your physician has recommended that you wear a holter monitor. Holter monitors are medical devices that record the heart's electrical activity. Doctors most often use these monitors to diagnose arrhythmias. Arrhythmias are problems with the speed or rhythm of the heartbeat. The monitor is a small, portable device. You can wear one while you do your normal daily activities. This is usually used to diagnose what is causing palpitations/syncope (passing out).    Follow-Up: Your physician recommends that you schedule a follow-up appointment with Dr. Fletcher Anon as needed.   Any Other Special Instructions Will Be Listed Below (If Applicable).  Echocardiogram An echocardiogram, or echocardiography, uses sound waves (ultrasound) to produce an image of your heart. The echocardiogram is simple, painless, obtained within a short period of time, and offers valuable information to your health care provider. The images from an echocardiogram can provide information such as:  Evidence of coronary artery disease (CAD).  Heart size.  Heart muscle function.  Heart valve function.  Aneurysm detection.  Evidence of a past heart attack.  Fluid buildup around the heart.  Heart muscle thickening.  Assess heart valve function. LET [facility] CARE PROVIDER KNOW ABOUT:  Any allergies you have.  All medicines you are  taking, including vitamins, herbs, eye drops, creams, and over-the-counter medicines.  Previous problems you or members of your family have had with the use of anesthetics.  Any blood disorders you have.  Previous surgeries you have had.  Medical conditions you have.  Possibility of pregnancy, if this applies. BEFORE THE PROCEDURE  No special preparation is needed. Eat and drink normally.  PROCEDURE   In order to produce an image of your heart, gel will be applied to your chest and a wand-like tool (transducer) will be moved over your chest. The gel will help transmit the sound waves from the transducer. The sound waves will harmlessly bounce off your heart to allow the heart images to be captured in real-time motion. These images will then be recorded.  You may need an IV to receive a medicine that improves the quality of the pictures. AFTER THE PROCEDURE You may return to your normal schedule including diet, activities, and medicines, unless your health care provider tells you otherwise. Document Released: 12/06/2000 Document Revised: 04/25/2014 Document Reviewed: 08/16/2013 [facility] Patient Information 2015 [place], [place]. This information is not intended to replace advice given to you by your health care provider. Make sure you discuss any questions you have with your health care provider. Cardiac Event Monitoring A cardiac event monitor is a small recording device used to help detect abnormal heart rhythms (arrhythmias). The monitor is used to record heart rhythm when noticeable symptoms such as the following occur:  Fast heartbeats (palpitations), such as heart racing or fluttering.  Dizziness.  Fainting or light-headedness.  Unexplained weakness. The monitor is wired to two electrodes placed on your chest. Electrodes are flat, sticky disks that attach to your skin. The monitor can be worn  for up to 30 days. You will wear the monitor at all times, except when bathing.  HOW TO  USE YOUR CARDIAC EVENT MONITOR A technician will prepare your chest for the electrode placement. The technician will show you how to place the electrodes, how to work the monitor, and how to replace the batteries. Take time to practice using the monitor before you leave the office. Make sure you understand how to send the information from the monitor to your health care provider. This requires a telephone with a landline, not a cell phone. You need to:  Wear your monitor at all times, except when you are in water:  Do not get the monitor wet.  Take the monitor off when bathing. Do not swim or use a hot tub with it on.  Keep your skin clean. Do not put body lotion or moisturizer on your chest.  Change the electrodes daily or any time they stop sticking to your skin. You might need to use tape to keep them on.  It is possible that your skin under the electrodes could become irritated. To keep this from happening, try to put the electrodes in slightly different places on your chest. However, they must remain in the area under your left breast and in the upper right section of your chest.  Make sure the monitor is safely clipped to your clothing or in a location close to your body that your health care provider recommends.  Press the button to record when you feel symptoms of heart trouble, such as dizziness, weakness, light-headedness, palpitations, thumping, shortness of breath, unexplained weakness, or a fluttering or racing heart. The monitor is always on and records what happened slightly before you pressed the button, so do not worry about being too late to get good information.  Keep a diary of your activities, such as walking, doing chores, and taking medicine. It is especially important to note what you were doing when you pushed the button to record your symptoms. This will help your health care provider determine what might be contributing to your symptoms. The information stored in your  monitor will be reviewed by your health care provider alongside your diary entries.  Send the recorded information as recommended by your health care provider. It is important to understand that it will take some time for your health care provider to process the results.  Change the batteries as recommended by your health care provider. SEEK IMMEDIATE MEDICAL CARE IF:   You have chest pain.  You have extreme difficulty breathing or shortness of breath.  You develop a very fast heartbeat that persists.  You develop dizziness that does not go away.  You faint or constantly feel you are about to faint. Document Released: 09/17/2008 Document Revised: 04/25/2014 Document Reviewed: 06/07/2013 [facility] Patient Information 2015 [place], [place]. This information is not intended to replace advice given to you by your health care provider. Make sure you discuss any questions you have with your health care provider.

## 2015-08-22 NOTE — Progress Notes (Signed)
Primary care physician: Dr. Diona Browner  HPI  This is a 39 year old female who was referred for evaluation of palpitations. She has no previous cardiac history. She has known history of hypertension with previous pre-eclampsia, obesity, previous tobacco use and anxiety. She quit smoking in February. She was having severe anxiety in March and was started on sertraline with subsequent improvement. However, she continued to have palpitations around that time described as skipping in her heart without tachycardia. She denies dizziness, syncope or presyncope. When she feels a skipping in her heart she complains of a tightness feeling in the chest. She used to be on clonazepam last year for about 9 months but that has been weaned off early this year. She cut down on caffeine intake and currently only drinks decaf occasionally. She does not drink alcohol and does not take any stimulants. In April, she was found to have some PACs and PVCs on EKG. She was started on metoprolol 25 mg twice daily to be used as needed. However, she reports no improvement in symptoms with metoprolol   Allergies  Allergen Reactions  . Sulfa Antibiotics Hives     Current Outpatient Prescriptions on File Prior to Visit  Medication Sig Dispense Refill  . losartan-hydrochlorothiazide (HYZAAR) 50-12.5 MG per tablet Take 2 tablets by mouth daily. 180 tablet 3  . metoprolol tartrate (LOPRESSOR) 25 MG tablet Take 1 tablet (25 mg total) by mouth 2 (two) times daily. (Patient taking differently: Take 25 mg by mouth 2 (two) times daily as needed. ) 60 tablet 5  . sertraline (ZOLOFT) 100 MG tablet Take 1 tablet (100 mg total) by mouth daily. 30 tablet 5  . triamcinolone cream (KENALOG) 0.5 % Apply 1 application topically 2 (two) times daily. 30 g 1   No current facility-administered medications on file prior to visit.     Past Medical History  Diagnosis Date  . Acne      Past Surgical History  Procedure Laterality Date  .  Appendectomy      2005   . Cesarean section  11/17/12  . Dilation and curettage of uterus  07/2010  . Cholecystectomy  03/31/14     Family History  Problem Relation Age of Onset  . Family history unknown: Yes     Social History   Social History  . Marital Status: Married    Spouse Name: N/A  . Number of Children: N/A  . Years of Education: N/A   Occupational History  . accounting    Social History Main Topics  . Smoking status: Former Smoker -- 1.00 packs/day for 10 years    Types: Cigarettes  . Smokeless tobacco: Never Used  . Alcohol Use: No  . Drug Use: No  . Sexual Activity: Not on file   Other Topics Concern  . Not on file   Social History Narrative     ROS A 10 point review of system was performed. It is negative other than that mentioned in the history of present illness.   PHYSICAL EXAM   BP 130/82 mmHg  Pulse 74  Ht 5\' 4"  (1.626 m)  Wt 227 lb 12 oz (103.307 kg)  BMI 39.07 kg/m2 Constitutional: She is oriented to person, place, and time. She appears well-developed and well-nourished. No distress.  HENT: No nasal discharge.  Head: Normocephalic and atraumatic.  Eyes: Pupils are equal and round. No discharge.  Neck: Normal range of motion. Neck supple. No JVD present. No thyromegaly present.  Cardiovascular: Normal rate, regular rhythm, normal  heart sounds. Exam reveals no gallop and no friction rub. No murmur heard.  Pulmonary/Chest: Effort normal and breath sounds normal. No stridor. No respiratory distress. She has no wheezes. She has no rales. She exhibits no tenderness.  Abdominal: Soft. Bowel sounds are normal. She exhibits no distension. There is no tenderness. There is no rebound and no guarding.  Musculoskeletal: Normal range of motion. She exhibits no edema and no tenderness.  Neurological: She is alert and oriented to person, place, and time. Coordination normal.  Skin: Skin is warm and dry. No rash noted. She is not diaphoretic. No  erythema. No pallor.  Psychiatric: She has a normal mood and affect. Her behavior is normal. Judgment and thought content normal.     NLG:[id number]  Rhythm  WITHIN NORMAL LIMITS   ASSESSMENT AND PLAN

## 2015-08-22 NOTE — Assessment & Plan Note (Signed)
The patient's symptoms seem to be related to PVCs which were noted on previous EKG in April. By physical exam today, she did also have premature beats. She does not consume excessive amount of caffeine and had labs in April were unremarkable including normal thyroid function. She reports significant improvement in her anxiety symptoms on sertraline and yet the palpitations continued. I discussed with her the overall benign course of PVCs in the absence of structural heart disease or cardiomyopathy. If the burden of PVCs not high, the majority of the time no treatment is needed. I requested a 48-hour Holter monitor to evaluate this. I also requested an echocardiogram.

## 2015-08-23 ENCOUNTER — Ambulatory Visit (INDEPENDENT_AMBULATORY_CARE_PROVIDER_SITE_OTHER): Payer: Managed Care, Other (non HMO) | Admitting: Psychology

## 2015-08-23 DIAGNOSIS — F4323 Adjustment disorder with mixed anxiety and depressed mood: Secondary | ICD-10-CM | POA: Diagnosis not present

## 2015-08-25 HISTORY — PX: OTHER SURGICAL HISTORY: SHX169

## 2015-08-29 ENCOUNTER — Ambulatory Visit
Admission: RE | Admit: 2015-08-29 | Discharge: 2015-08-29 | Disposition: A | Discharge status: Home / Self Care | Payer: Managed Care, Other (non HMO) | Source: Ambulatory Visit | Attending: Cardiovascular Disease | Admitting: Cardiovascular Disease

## 2015-08-29 DIAGNOSIS — R002 Palpitations: Secondary | ICD-10-CM | POA: Diagnosis not present

## 2015-09-05 ENCOUNTER — Ambulatory Visit (INDEPENDENT_AMBULATORY_CARE_PROVIDER_SITE_OTHER): Payer: Managed Care, Other (non HMO)

## 2015-09-05 ENCOUNTER — Other Ambulatory Visit: Payer: Self-pay

## 2015-09-05 DIAGNOSIS — I493 Ventricular premature depolarization: Secondary | ICD-10-CM | POA: Diagnosis not present

## 2015-09-06 ENCOUNTER — Ambulatory Visit (INDEPENDENT_AMBULATORY_CARE_PROVIDER_SITE_OTHER): Payer: Managed Care, Other (non HMO) | Admitting: Psychology

## 2015-09-06 DIAGNOSIS — F4323 Adjustment disorder with mixed anxiety and depressed mood: Secondary | ICD-10-CM

## 2015-09-08 ENCOUNTER — Telehealth: Payer: Self-pay

## 2015-09-08 ENCOUNTER — Other Ambulatory Visit: Payer: Self-pay

## 2015-09-08 MED ORDER — DILTIAZEM HCL ER COATED BEADS 120 MG PO CP24: 120.0000 mg | ORAL_CAPSULE | Freq: Every day | ORAL | Status: DC

## 2015-09-08 NOTE — Telephone Encounter (Signed)
S/w pt regarding one month f/u appt. Pt agreeable to Oct 24, 3:45 w/Dr. Fletcher Anon.

## 2015-09-19 ENCOUNTER — Ambulatory Visit: Payer: Self-pay | Admitting: Cardiovascular Disease

## 2015-09-20 ENCOUNTER — Ambulatory Visit (INDEPENDENT_AMBULATORY_CARE_PROVIDER_SITE_OTHER): Payer: Managed Care, Other (non HMO) | Admitting: Psychology

## 2015-09-20 DIAGNOSIS — F4323 Adjustment disorder with mixed anxiety and depressed mood: Secondary | ICD-10-CM

## 2015-10-10 ENCOUNTER — Encounter: Payer: Self-pay | Admitting: Family Medicine

## 2015-10-10 ENCOUNTER — Ambulatory Visit (INDEPENDENT_AMBULATORY_CARE_PROVIDER_SITE_OTHER): Payer: Managed Care, Other (non HMO) | Admitting: Family Medicine

## 2015-10-10 VITALS — BP 102/70 | HR 70 | Temp 98.4°F | Ht 64.0 in | Wt 234.5 lb

## 2015-10-10 DIAGNOSIS — F411 Generalized anxiety disorder: Secondary | ICD-10-CM | POA: Diagnosis not present

## 2015-10-10 DIAGNOSIS — L700 Acne vulgaris: Secondary | ICD-10-CM | POA: Insufficient documentation

## 2015-10-10 DIAGNOSIS — I10 Essential (primary) hypertension: Secondary | ICD-10-CM

## 2015-10-10 DIAGNOSIS — F325 Major depressive disorder, single episode, in full remission: Secondary | ICD-10-CM | POA: Diagnosis not present

## 2015-10-10 DIAGNOSIS — R002 Palpitations: Secondary | ICD-10-CM | POA: Diagnosis not present

## 2015-10-10 MED ORDER — LOSARTAN POTASSIUM 100 MG PO TABS
100.0000 mg | ORAL_TABLET | Freq: Every day | ORAL | Status: DC
Start: 2015-10-10 — End: 2016-04-09

## 2015-10-10 NOTE — Assessment & Plan Note (Signed)
Start spirnolactone per DERM.

## 2015-10-10 NOTE — Assessment & Plan Note (Signed)
Not taking diltiazem. Symptoms less, follow up with Dr. Fletcher Anon.

## 2015-10-10 NOTE — Progress Notes (Signed)
Pre visit review using our clinic review tool, if applicable. No additional management support is needed unless otherwise documented below in the visit note. 

## 2015-10-10 NOTE — Progress Notes (Signed)
   Subjective:    Patient ID: Andrea Colon, female    DOB: [birth year], 39 y.o.   MRN: [id number]  HPI  39 year old female presents for 3 month follow up on depression and anxiety.  Improved mood on sertraline 100 mg daily.  No SE to current dose.  Sleeping moderately well, going to sleep okay,still waking up every 2-3 hours.  Less stress.  Not using any xanax.. Does not like the way it makes her feel.  She reports she saw Dr. Fletcher Anon for palpitations, PVC. Had Holter Monitor.   Started on diltiazem. She took once then stopped. She has follow up appt next week.  She has had palpitations less frequently and is not really interested in taking regular medication. Nml ECHO.  HTN well controlled on losartan HCTZ. No chest pain, no edema. NO SOB. BP Readings from Last 3 Encounters:  10/10/15 102/70  08/22/15 130/82  07/07/15 112/60   Not smoking.  Derm wants her to start 50 mg twice daily for acne.    Review of Systems  Constitutional: Negative for fever and fatigue.  HENT: Negative for ear pain.   Eyes: Negative for pain.  Respiratory: Negative for chest tightness and shortness of breath.   Cardiovascular: Negative for chest pain, palpitations and leg swelling.  Gastrointestinal: Negative for abdominal pain.  Genitourinary: Negative for dysuria.       Objective:   Physical Exam  Constitutional: Vital signs are normal. She appears well-developed and well-nourished. She is cooperative.  Non-toxic appearance. She does not appear ill. No distress.  HENT:  Head: Normocephalic.  Right Ear: Hearing, tympanic membrane, external ear and ear canal normal. Tympanic membrane is not erythematous, not retracted and not bulging.  Left Ear: Hearing, tympanic membrane, external ear and ear canal normal. Tympanic membrane is not erythematous, not retracted and not bulging.  Nose: No mucosal edema or rhinorrhea. Right sinus exhibits no maxillary sinus tenderness and no frontal sinus  tenderness. Left sinus exhibits no maxillary sinus tenderness and no frontal sinus tenderness.  Mouth/Throat: Uvula is midline, oropharynx is clear and moist and mucous membranes are normal.  Eyes: Conjunctivae, EOM and lids are normal. Pupils are equal, round, and reactive to light. Lids are everted and swept, no foreign bodies found.  Neck: Trachea normal and normal range of motion. Neck supple. Carotid bruit is not present. No thyroid mass and no thyromegaly present.  Cardiovascular: Normal rate, regular rhythm, S1 normal, S2 normal, normal heart sounds, intact distal pulses and normal pulses.  Exam reveals no gallop and no friction rub.   No murmur heard. Pulmonary/Chest: Effort normal and breath sounds normal. No tachypnea. No respiratory distress. She has no decreased breath sounds. She has no wheezes. She has no rhonchi. She has no rales.  Abdominal: Soft. Normal appearance and bowel sounds are normal. There is no tenderness.  Neurological: She is alert.  Skin: Skin is warm, dry and intact. No rash noted.  Acne on face.. pustules  Psychiatric: Her speech is normal and behavior is normal. Judgment and thought content normal. Her mood appears anxious. She is not agitated. Cognition and memory are normal. She does not exhibit a depressed mood.          Assessment & Plan:

## 2015-10-10 NOTE — Assessment & Plan Note (Signed)
Well controlled on sertraline 100 mg daily. 

## 2015-10-10 NOTE — Assessment & Plan Note (Signed)
Well controlled , but given new spirolactone start... Concern fo over diuresis and  Low BPs.  Change to losartan alone and hold HCTZ when spirnolactone started.  Follow K and CR closely.

## 2015-10-10 NOTE — Patient Instructions (Addendum)
Pushing fluids, avoiding caffeine. Follow up Dr. Fletcher Anon. Change to losartan alone, stop HCTZ when starting spirolactone for acne. Follow up in 2 weeks for lab only BMET.  Follow BP at home, call if running < 90/60.

## 2015-10-10 NOTE — Assessment & Plan Note (Signed)
MOderate control on current med. Rec relaxation and stress reduction techniques.

## 2015-10-11 ENCOUNTER — Ambulatory Visit: Payer: Managed Care, Other (non HMO) | Admitting: Psychology

## 2015-10-13 ENCOUNTER — Ambulatory Visit: Payer: Self-pay | Admitting: Cardiovascular Disease

## 2015-10-16 ENCOUNTER — Ambulatory Visit: Payer: Self-pay | Admitting: Cardiovascular Disease

## 2015-10-17 ENCOUNTER — Other Ambulatory Visit: Payer: Self-pay | Admitting: Family Medicine

## 2015-10-17 DIAGNOSIS — I10 Essential (primary) hypertension: Secondary | ICD-10-CM

## 2015-10-25 ENCOUNTER — Other Ambulatory Visit (INDEPENDENT_AMBULATORY_CARE_PROVIDER_SITE_OTHER): Payer: Managed Care, Other (non HMO)

## 2015-10-25 DIAGNOSIS — I10 Essential (primary) hypertension: Secondary | ICD-10-CM

## 2015-10-25 LAB — BASIC METABOLIC PANEL
BUN: 10 mg/dL (ref 6–23)
CHLORIDE: 100 meq/L (ref 96–112)
CO2: 30 meq/L (ref 19–32)
Calcium: 8.9 mg/dL (ref 8.4–10.5)
Creatinine, Ser: 0.75 mg/dL (ref 0.40–1.20)
GFR: 90.94 mL/min (ref >60.00)
GLUCOSE: 134 mg/dL — AB (ref 70–99)
POTASSIUM: 3.9 meq/L (ref 3.5–5.1)
SODIUM: 136 meq/L (ref 135–145)

## 2015-10-26 ENCOUNTER — Encounter: Payer: Self-pay | Admitting: Family Medicine

## 2015-11-12 ENCOUNTER — Other Ambulatory Visit: Payer: Self-pay | Admitting: Family Medicine

## 2015-11-14 ENCOUNTER — Ambulatory Visit: Payer: Managed Care, Other (non HMO) | Admitting: Nurse Practitioner

## 2016-04-08 ENCOUNTER — Telehealth: Payer: Self-pay

## 2016-04-08 NOTE — Telephone Encounter (Addendum)
[facility] left v/m wanting to verify if pt is taking losartan 100 mg or hyzaar 50-12.5 mg.Please advise. Losartan 100 mg is on pts med list. And Hyzaar 50- 12.5 is on hx med list. Unable to reach pt by phone. per 10/10/15 visit pt was taking Hyzaar but since starting spironalactone will change to losartan. Cannot tell from med list if pt is taking spironolactone.

## 2016-04-09 ENCOUNTER — Telehealth: Payer: Self-pay | Admitting: Family Medicine

## 2016-04-09 ENCOUNTER — Other Ambulatory Visit (INDEPENDENT_AMBULATORY_CARE_PROVIDER_SITE_OTHER): Payer: Managed Care, Other (non HMO)

## 2016-04-09 DIAGNOSIS — E781 Pure hyperglyceridemia: Secondary | ICD-10-CM

## 2016-04-09 DIAGNOSIS — R7303 Prediabetes: Secondary | ICD-10-CM

## 2016-04-09 LAB — COMPREHENSIVE METABOLIC PANEL
ALT: 13 U/L (ref 0–35)
AST: 15 U/L (ref 0–37)
Albumin: 3.9 g/dL (ref 3.5–5.2)
Alkaline Phosphatase: 43 U/L (ref 39–117)
BUN: 12 mg/dL (ref 6–23)
CALCIUM: 8.9 mg/dL (ref 8.4–10.5)
CHLORIDE: 98 meq/L (ref 96–112)
CO2: 30 meq/L (ref 19–32)
CREATININE: 0.74 mg/dL (ref 0.40–1.20)
GFR: 92.15 mL/min (ref >60.00)
Glucose, Bld: 130 mg/dL — ABNORMAL HIGH (ref 70–99)
Potassium: 3.2 mEq/L — ABNORMAL LOW (ref 3.5–5.1)
Sodium: 136 mEq/L (ref 135–145)
Total Bilirubin: 0.3 mg/dL (ref 0.2–1.2)
Total Protein: 7.4 g/dL (ref 6.0–8.3)

## 2016-04-09 LAB — LIPID PANEL
CHOL/HDL RATIO: 4
CHOLESTEROL: 131 mg/dL (ref 0–200)
HDL: 30.1 mg/dL — ABNORMAL LOW (ref >39.00)
NonHDL: 100.79
Triglycerides: 370 mg/dL — ABNORMAL HIGH (ref 0.0–149.0)
VLDL: 74 mg/dL — ABNORMAL HIGH (ref 0.0–40.0)

## 2016-04-09 LAB — LDL CHOLESTEROL, DIRECT: Direct LDL: 46 mg/dL

## 2016-04-09 LAB — HEMOGLOBIN A1C: Hgb A1c MFr Bld: 6.1 % (ref 4.6–6.5)

## 2016-04-09 NOTE — Telephone Encounter (Signed)
Noted  

## 2016-04-09 NOTE — Telephone Encounter (Signed)
Spoke with [employer]. She has stopped the spironolactone and once she stopped that, she started having swelling in her legs.  She had a refill on her Hyzaar 50-12.5 mg so she went a head a refilled it because she felt she needed the fluid pill.  She was going to discuss this with Dr. Diona Browner at her upcoming visit.  She is not taking the Losartan.   Pharmacist at [facility] notified of change.  Medication list updated.

## 2016-04-09 NOTE — Telephone Encounter (Signed)
-----   Message from Marchia Bond sent at 04/03/2016 11:49 AM EDT ----- Regarding: Cpx labs Tues 4/18, need orders. Thanks! :-) Please order  future cpx labs for pt's upcoming lab appt. Thanks Aniceto Boss

## 2016-04-16 ENCOUNTER — Encounter: Payer: Self-pay | Admitting: Family Medicine

## 2016-04-16 ENCOUNTER — Ambulatory Visit (INDEPENDENT_AMBULATORY_CARE_PROVIDER_SITE_OTHER): Payer: Managed Care, Other (non HMO) | Admitting: Family Medicine

## 2016-04-16 VITALS — BP 100/60 | HR 80 | Temp 98.5°F | Ht 64.0 in | Wt 260.0 lb

## 2016-04-16 DIAGNOSIS — M545 Low back pain, unspecified: Secondary | ICD-10-CM | POA: Insufficient documentation

## 2016-04-16 DIAGNOSIS — E781 Pure hyperglyceridemia: Secondary | ICD-10-CM | POA: Diagnosis not present

## 2016-04-16 DIAGNOSIS — Z0001 Encounter for general adult medical examination with abnormal findings: Secondary | ICD-10-CM | POA: Diagnosis not present

## 2016-04-16 DIAGNOSIS — Z0000 Encounter for general adult medical examination without abnormal findings: Secondary | ICD-10-CM

## 2016-04-16 DIAGNOSIS — R7303 Prediabetes: Secondary | ICD-10-CM

## 2016-04-16 DIAGNOSIS — F325 Major depressive disorder, single episode, in full remission: Secondary | ICD-10-CM | POA: Diagnosis not present

## 2016-04-16 DIAGNOSIS — I10 Essential (primary) hypertension: Secondary | ICD-10-CM

## 2016-04-16 DIAGNOSIS — F411 Generalized anxiety disorder: Secondary | ICD-10-CM

## 2016-04-16 MED ORDER — DICLOFENAC SODIUM 75 MG PO TBEC: 75.0000 mg | DELAYED_RELEASE_TABLET | Freq: Two times a day (BID) | ORAL | Status: DC

## 2016-04-16 NOTE — Assessment & Plan Note (Signed)
Well controlled. Continue current medication.  

## 2016-04-16 NOTE — Patient Instructions (Addendum)
Increase potassium in diet. On low carb diet. Decrease sweet tea.  Work on increasing exercise to 3-5 times a week.  Call to schedule mammogram on your own. Start diclofenac twice daily as needed for inflammation and pain. Start home physical therapy. Start heat on low back. No heavy lifting, > 10 lbs. If not improving in 2 weeks, follow up for further imaging.    Hypokalemia Hypokalemia means that the amount of potassium in the blood is lower than normal.Potassium is a chemical, called an electrolyte, that helps regulate the amount of fluid in the body. It also stimulates muscle contraction and helps nerves function properly.Most of the body's potassium is inside of cells, and only a very small amount is in the blood. Because the amount in the blood is so small, minor changes can be life-threatening. CAUSES  Antibiotics.  Diarrhea or vomiting.  Using laxatives too much, which can cause diarrhea.  Chronic kidney disease.  Water pills (diuretics).  Eating disorders (bulimia).  Low magnesium level.  Sweating a lot. SIGNS AND SYMPTOMS  Weakness.  Constipation.  Fatigue.  Muscle cramps.  Mental confusion.  Skipped heartbeats or irregular heartbeat (palpitations).  Tingling or numbness. DIAGNOSIS  Your health care provider can diagnose hypokalemia with blood tests. In addition to checking your potassium level, your health care provider may also check other lab tests. TREATMENT Hypokalemia can be treated with potassium supplements taken by mouth or adjustments in your current medicines. If your potassium level is very low, you may need to get potassium through a vein (IV) and be monitored in the hospital. A diet high in potassium is also helpful. Foods high in potassium are:  Nuts, such as peanuts and pistachios.  Seeds, such as sunflower seeds and pumpkin seeds.  Peas, lentils, and lima beans.  Whole grain and bran cereals and breads.  Fresh fruit and vegetables,  such as apricots, avocado, bananas, cantaloupe, kiwi, oranges, tomatoes, asparagus, and potatoes.  Orange and tomato juices.  Red meats.  Fruit yogurt. HOME CARE INSTRUCTIONS  Take all medicines as prescribed by your health care provider.  Maintain a healthy diet by including nutritious food, such as fruits, vegetables, nuts, whole grains, and lean meats.  If you are taking a laxative, be sure to follow the directions on the label. SEEK MEDICAL CARE IF:  Your weakness gets worse.  You feel your heart pounding or racing.  You are vomiting or having diarrhea.  You are diabetic and having trouble keeping your blood glucose in the normal range. SEEK IMMEDIATE MEDICAL CARE IF:  You have chest pain, shortness of breath, or dizziness.  You are vomiting or having diarrhea for more than 2 days.  You faint. MAKE SURE YOU:   Understand these instructions.  Will watch your condition.  Will get help right away if you are not doing well or get worse.   This information is not intended to replace advice given to you by your health care provider. Make sure you discuss any questions you have with your health care provider.   Document Released: 12/09/2005 Document Revised: 12/30/2014 Document Reviewed: 06/11/2013 Elsevier Interactive Patient Education [employer].

## 2016-04-16 NOTE — Assessment & Plan Note (Signed)
Treat with NSAIDs, low back stretching info given, heat. In long run core strengthening and weight loss are recommended.

## 2016-04-16 NOTE — Assessment & Plan Note (Signed)
Worsened control. Encouraged exercise, weight loss, healthy eating habits. Low carb info given.

## 2016-04-16 NOTE — Progress Notes (Signed)
The patient is here for annual w ellness exam and preventative care.   Low back pain, ongoing x 4 months. Intermittent. When up on feet and bending forward.  Across low back, no radiation.  No falls. No numbness, no weakness. he has trid ibuprofen  800 mg off and on. Helps a lot.  Pain comes back.  Hypertension:  Now back on losartan HCTZ.  Spirolactone was on for acne.. Gained weight, menses stopped... So she stopped in last 3 weeks. BP Readings from Last 3 Encounters:  04/16/16 100/60  10/10/15 102/70  08/22/15 130/82  Using medication without problems or lightheadedness: occ Chest pain with exertion:None Edema: occ Short of breath:None Average home BPs: 100/60s Other issues: recurrent palpitations: rare.  Prediabetes:    Worse control,   Did have coffee 1.5 hr before test.. CBG 130 Lab Results  Component Value Date   HGBA1C 6.1 04/09/2016  Exercise: less active Diet: moderate, not great control.  Anxiety, generalized, with panic attacks:  Stable control on sertraline 100 mg daily. Depression, in remission.  Elevated Cholesterol:  LDL at goal , trig high. Lab Results  Component Value Date   CHOL 131 04/09/2016   HDL 30.10* 04/09/2016   LDLCALC 82 11/13/2011   LDLDIRECT 46.0 04/09/2016   TRIG 370.0* 04/09/2016   CHOLHDL 4 04/09/2016    She has gained 30 lbs in past 6 months. Wt Readings from Last 3 Encounters:  04/16/16 260 lb (117.935 kg)  10/10/15 234 lb 8 oz (106.369 kg)  08/22/15 227 lb 12 oz (103.307 kg)    Social History /Family History/Past Medical History reviewed and updated if needed.   Review of Systems  Constitutional: Negative for fever and fatigue.  HENT: Negative for ear pain.  Eyes: Negative for pain.  Respiratory: Negative for chest tightness and shortness of breath.  Cardiovascular: Positive for palpitations. Negative for chest pain and leg swelling.  Gastrointestinal: Negative for abdominal pain.  Genitourinary: Negative for  dysuria.       Objective:   Physical Exam  Constitutional: Vital signs are normal. She appears well-developed and well-nourished. She is cooperative. Non-toxic appearance. She does not appear ill. No distress.  HENT:  Head: Normocephalic.  Right Ear: Hearing, tympanic membrane, external ear and ear canal normal.  Left Ear: Hearing, tympanic membrane, external ear and ear canal normal.  Nose: Nose normal.  Eyes: Conjunctivae, EOM and lids are normal. Pupils are equal, round, and reactive to light. Lids are everted and swept, no foreign bodies found.  Neck: Trachea normal and normal range of motion. Neck supple. Carotid bruit is not present. No thyroid mass and no thyromegaly present.  Cardiovascular: Normal rate, regular rhythm, S1 normal, S2 normal, normal heart sounds and intact distal pulses. Exam reveals no gallop.  No murmur heard. Pulmonary/Chest: Effort normal and breath sounds normal. No respiratory distress. She has no wheezes. She has no rhonchi. She has no rales.  Abdominal: Soft. Normal appearance and bowel sounds are normal. She exhibits no distension, no fluid wave, no abdominal bruit and no mass. There is no hepatosplenomegaly. There is no tenderness. There is no rebound, no guarding and no CVA tenderness. No hernia.  Genitourinary: per GYN. Lymphadenopathy:   She has no cervical adenopathy.   She has no axillary adenopathy.  Neurological: She is alert. She has normal strength. No cranial nerve deficit or sensory deficit.  Skin: Skin is warm, dry and intact. No rash noted.  Psychiatric: Her speech is normal and behavior is normal. Judgment normal.  Her mood appears not anxious. Cognition and memory are normal. She does not exhibit a depressed mood.       Assessment & Plan:  The patient's preventative maintenance and recommended screening tests for an annual wellness exam were reviewed in full today. Brought up to date unless services declined.  Counselled on  the importance of diet, exercise, and its role in overall health and mortality. The patient's FH and SH was reviewed, including their home life, tobacco status, and drug and alcohol status.   Vaccines: Last Tdap in 2013 STD screen: refused Colon: no early family history. Mammogram: due. Per GYN. PAP/DVE:  nml pap 03/2015, plan q3 year screen, Has IUD in place. Nml DVE with GYN 09/2015.

## 2016-04-16 NOTE — Assessment & Plan Note (Signed)
Likely contributing to back pain. May be worse secondary to spirolactone, now off.

## 2016-04-16 NOTE — Progress Notes (Signed)
Pre visit review using our clinic review tool, if applicable. No additional management support is needed unless otherwise documented below in the visit note. 

## 2016-04-16 NOTE — Assessment & Plan Note (Signed)
Excellent control on zoloft 100 mg daily

## 2016-04-16 NOTE — Assessment & Plan Note (Signed)
Great control on current meds. ?

## 2016-04-22 ENCOUNTER — Other Ambulatory Visit: Payer: Self-pay | Admitting: Family Medicine

## 2016-05-08 ENCOUNTER — Other Ambulatory Visit: Payer: Self-pay | Admitting: Family Medicine

## 2016-06-11 ENCOUNTER — Other Ambulatory Visit: Payer: Self-pay | Admitting: Family Medicine

## 2016-06-11 ENCOUNTER — Encounter: Payer: Self-pay | Admitting: Family Medicine

## 2016-06-11 MED ORDER — LOSARTAN POTASSIUM 100 MG PO TABS: 100.0000 mg | ORAL_TABLET | Freq: Every day | ORAL | Status: DC

## 2016-06-11 NOTE — Telephone Encounter (Signed)
See My Chart message from 06/11/2016.  Ok to refill?

## 2016-07-09 ENCOUNTER — Other Ambulatory Visit: Payer: Self-pay

## 2016-07-09 ENCOUNTER — Telehealth: Payer: Self-pay | Admitting: Family Medicine

## 2016-07-09 DIAGNOSIS — E876 Hypokalemia: Secondary | ICD-10-CM

## 2016-07-09 NOTE — Telephone Encounter (Signed)
-----   Message from Ellamae Sia sent at 07/04/2016 10:07 AM EDT ----- Regarding: Lab orders for Tuesday, 7.18.17 Lab orders for a 3 month follow up appt.

## 2016-07-16 ENCOUNTER — Ambulatory Visit: Payer: Self-pay | Admitting: Family Medicine

## 2016-08-20 ENCOUNTER — Other Ambulatory Visit (INDEPENDENT_AMBULATORY_CARE_PROVIDER_SITE_OTHER): Payer: Managed Care, Other (non HMO)

## 2016-08-20 DIAGNOSIS — E876 Hypokalemia: Secondary | ICD-10-CM | POA: Diagnosis not present

## 2016-08-20 LAB — BASIC METABOLIC PANEL
BUN: 9 mg/dL (ref 6–23)
CHLORIDE: 101 meq/L (ref 96–112)
CO2: 28 meq/L (ref 19–32)
CREATININE: 0.88 mg/dL (ref 0.40–1.20)
Calcium: 8.8 mg/dL (ref 8.4–10.5)
GFR: 75.31 mL/min (ref >60.00)
Glucose, Bld: 114 mg/dL — ABNORMAL HIGH (ref 70–99)
Potassium: 4 mEq/L (ref 3.5–5.1)
Sodium: 137 mEq/L (ref 135–145)

## 2016-08-23 ENCOUNTER — Ambulatory Visit (INDEPENDENT_AMBULATORY_CARE_PROVIDER_SITE_OTHER): Payer: Managed Care, Other (non HMO) | Admitting: Family Medicine

## 2016-08-23 ENCOUNTER — Encounter: Payer: Self-pay | Admitting: Family Medicine

## 2016-08-23 VITALS — BP 90/60 | HR 90 | Temp 98.4°F | Ht 64.8 in | Wt 262.8 lb

## 2016-08-23 DIAGNOSIS — Z23 Encounter for immunization: Secondary | ICD-10-CM | POA: Diagnosis not present

## 2016-08-23 DIAGNOSIS — E661 Drug-induced obesity: Secondary | ICD-10-CM | POA: Diagnosis not present

## 2016-08-23 DIAGNOSIS — F330 Major depressive disorder, recurrent, mild: Secondary | ICD-10-CM

## 2016-08-23 DIAGNOSIS — B373 Candidiasis of vulva and vagina: Secondary | ICD-10-CM | POA: Diagnosis not present

## 2016-08-23 DIAGNOSIS — B3731 Acute candidiasis of vulva and vagina: Secondary | ICD-10-CM

## 2016-08-23 MED ORDER — BUPROPION HCL ER (XL) 150 MG PO TB24: 150.0000 mg | ORAL_TABLET | Freq: Every day | ORAL | 3 refills | Status: DC

## 2016-08-23 MED ORDER — FLUCONAZOLE 150 MG PO TABS: 150.0000 mg | ORAL_TABLET | Freq: Once | ORAL | 0 refills | Status: AC

## 2016-08-23 NOTE — Addendum Note (Signed)
Addended by: Carter Kitten on: 08/23/2016 09:57 AM   Modules accepted: Orders

## 2016-08-23 NOTE — Assessment & Plan Note (Addendum)
Possible slight worsening with  Poor motivation.  Will add wellbutrin to sertraline.

## 2016-08-23 NOTE — Patient Instructions (Addendum)
Start Wellbutrin in AM.   Keep fitness and calorie log.. My Fitness Pal.  Start with home PT. Find low impact way to get exercise like bike.  Complete one day course of diflucan.   Can look into BELVIQ coverage.

## 2016-08-23 NOTE — Assessment & Plan Note (Signed)
Possible worsneing and SE of sertraline.  Decreased motivation and anhedonia suggest possible inadequate control of mood.  Will add wellbutrin to counteract sertraline and increase motivation.  Pt will work aggressively on diet and lifestyle changes. She will look into Belviq coverage as a possible option as well..  Close follow up in 1 week.

## 2016-08-23 NOTE — Progress Notes (Signed)
Pre visit review using our clinic review tool, if applicable. No additional management support is needed unless otherwise documented below in the visit note. 

## 2016-08-23 NOTE — Progress Notes (Signed)
Subjective:    Patient ID: Andrea Colon, female    DOB: 1976-01-23, 40 y.o.   MRN: [id number]  HPI   40 year old female presents for 3 month follow up weight management for morbid obesity  Body mass index is 43.99 kg/m.     She has continued to gain weight from last OV.  Stimulant medications are contraindicated in this pt given anxiety, palpitation and HTN.  She is on sertraline that can cause weight gain.   At last OV recommended healthy lifestyle, increase in exercise. Wt Readings from Last 3 Encounters:  08/23/16 262 lb 12 oz (119.2 kg)  04/16/16 260 lb (117.9 kg)  10/10/15 234 lb 8 oz (106.4 kg)   She report she has  Not been very aggressive with  exercise given low back issues. Ortho thought SI joint. Treated with PT.  She is unmotivated to make diet changes.  She feels that zoloft is part of her issue.. Fatigue, weight gain and  Carb craving. She also feels it decreases her motivation.  She feels her  Motivation was much.  She has been on 2 rounds of antibiotics.. From UC.. For ear infection.  Now with yeast infection.. Minimal hite thick vaginal discharge,  very itchy..   Review of Systems  Constitutional: Positive for fatigue. Negative for fever.  HENT: Negative for ear pain.   Eyes: Negative for pain.  Respiratory: Negative for chest tightness and shortness of breath.   Cardiovascular: Negative for chest pain, palpitations and leg swelling.  Gastrointestinal: Negative for abdominal pain.  Genitourinary: Negative for dysuria.  Psychiatric/Behavioral: Positive for dysphoric mood. Negative for sleep disturbance and suicidal ideas.       Anhedonia and decreased motivation.       Objective:   Physical Exam  Constitutional: Vital signs are normal. She appears well-developed and well-nourished. She is cooperative.  Non-toxic appearance. She does not appear ill. No distress.  HENT:  Head: Normocephalic.  Right Ear: Hearing, tympanic membrane, external ear  and ear canal normal. Tympanic membrane is not erythematous, not retracted and not bulging.  Left Ear: Hearing, tympanic membrane, external ear and ear canal normal. Tympanic membrane is not erythematous, not retracted and not bulging.  Nose: No mucosal edema or rhinorrhea. Right sinus exhibits no maxillary sinus tenderness and no frontal sinus tenderness. Left sinus exhibits no maxillary sinus tenderness and no frontal sinus tenderness.  Mouth/Throat: Uvula is midline, oropharynx is clear and moist and mucous membranes are normal.  Eyes: Conjunctivae, EOM and lids are normal. Pupils are equal, round, and reactive to light. Lids are everted and swept, no foreign bodies found.  Neck: Trachea normal and normal range of motion. Neck supple. Carotid bruit is not present. No thyroid mass and no thyromegaly present.  Cardiovascular: Normal rate, regular rhythm, S1 normal, S2 normal, normal heart sounds, intact distal pulses and normal pulses.  Exam reveals no gallop and no friction rub.   No murmur heard. Pulmonary/Chest: Effort normal and breath sounds normal. No tachypnea. No respiratory distress. She has no decreased breath sounds. She has no wheezes. She has no rhonchi. She has no rales.  Abdominal: Soft. Normal appearance and bowel sounds are normal. There is no tenderness.  Neurological: She is alert.  Skin: Skin is warm, dry and intact. No rash noted.  Psychiatric: Her speech is normal and behavior is normal. Judgment and thought content normal. Her mood appears not anxious. Cognition and memory are normal. She does not exhibit a depressed mood.  Assessment & Plan:

## 2016-08-23 NOTE — Assessment & Plan Note (Signed)
Treat with diflucan given recent antibitoics. No need for vag exam.

## 2016-09-20 ENCOUNTER — Ambulatory Visit (INDEPENDENT_AMBULATORY_CARE_PROVIDER_SITE_OTHER): Payer: Managed Care, Other (non HMO) | Admitting: Family Medicine

## 2016-09-20 ENCOUNTER — Encounter: Payer: Self-pay | Admitting: Family Medicine

## 2016-09-20 VITALS — BP 118/78 | HR 92 | Temp 98.5°F | Ht 64.0 in | Wt 260.2 lb

## 2016-09-20 DIAGNOSIS — E661 Drug-induced obesity: Secondary | ICD-10-CM

## 2016-09-20 DIAGNOSIS — H6983 Other specified disorders of Eustachian tube, bilateral: Secondary | ICD-10-CM

## 2016-09-20 DIAGNOSIS — F330 Major depressive disorder, recurrent, mild: Secondary | ICD-10-CM

## 2016-09-20 DIAGNOSIS — H6980 Other specified disorders of Eustachian tube, unspecified ear: Secondary | ICD-10-CM | POA: Insufficient documentation

## 2016-09-20 NOTE — Assessment & Plan Note (Signed)
Encouraged exercise, weight loss, healthy eating habits. Reviewed lifestyle changes and set out a plan for change.  Follow up in 2 months.  Belviq not covered by insurance.

## 2016-09-20 NOTE — Assessment & Plan Note (Signed)
Improved control of motivation and anhedonia with addition of wellbutrin to zoloft.

## 2016-09-20 NOTE — Progress Notes (Signed)
Pre visit review using our clinic review tool, if applicable. No additional management support is needed unless otherwise documented below in the visit note. 

## 2016-09-20 NOTE — Patient Instructions (Addendum)
Continue wellbutrin. Take early in the day.  Work on [employer], regular exercise and weight loss.  Stop sweet tea.

## 2016-09-20 NOTE — Progress Notes (Signed)
   Subjective:    Patient ID: Andrea Colon, female    DOB: [birth year], 40 y.o.   MRN: [id number]  HPI  40 year old female presents for 4 week follow up weight check.  At last OV  She felt zoloft was contributing to weight gain and carb craving.  given poor control of mood as well we chose to add wellbutrin.  Today she reports more motivation and energy. She has been able to do actitivities, get done what she needs to get done.  She has decreased appetite, she is still trying to make better choices.  Does not feel hungry all the time. No SE except slight issue sleeping. Better if taken early.  She has not been able to start exercise yet.  Wt Readings from Last 3 Encounters:  09/20/16 260 lb 4 oz (118 kg)  08/23/16 262 lb 12 oz (119.2 kg)  04/16/16 260 lb (117.9 kg)    Recent OM. In left ear. Pt with cotton in ear feeling.   Review of Systems  Constitutional: Negative for fatigue and fever.  HENT: Negative for ear pain.   Eyes: Negative for pain.  Respiratory: Negative for chest tightness and shortness of breath.   Cardiovascular: Negative for chest pain, palpitations and leg swelling.  Gastrointestinal: Negative for abdominal pain.  Genitourinary: Negative for dysuria.       Objective:   Physical Exam  Constitutional: Vital signs are normal. She appears well-developed and well-nourished. She is cooperative.  Non-toxic appearance. She does not appear ill. No distress.  HENT:  Head: Normocephalic.  Right Ear: Hearing, external ear and ear canal normal. Tympanic membrane is not erythematous, not retracted and not bulging. A middle ear effusion is present.  Left Ear: Hearing, external ear and ear canal normal. Tympanic membrane is not erythematous, not retracted and not bulging. A middle ear effusion is present.  Nose: No mucosal edema or rhinorrhea. Right sinus exhibits no maxillary sinus tenderness and no frontal sinus tenderness. Left sinus exhibits no maxillary sinus  tenderness and no frontal sinus tenderness.  Mouth/Throat: Uvula is midline, oropharynx is clear and moist and mucous membranes are normal.  Eyes: Conjunctivae, EOM and lids are normal. Pupils are equal, round, and reactive to light. Lids are everted and swept, no foreign bodies found.  Neck: Trachea normal and normal range of motion. Neck supple. Carotid bruit is not present. No thyroid mass and no thyromegaly present.  Cardiovascular: Normal rate, regular rhythm, S1 normal, S2 normal, normal heart sounds, intact distal pulses and normal pulses.  Exam reveals no gallop and no friction rub.   No murmur heard. Pulmonary/Chest: Effort normal and breath sounds normal. No tachypnea. No respiratory distress. She has no decreased breath sounds. She has no wheezes. She has no rhonchi. She has no rales.  Abdominal: Soft. Normal appearance and bowel sounds are normal. There is no tenderness.  Neurological: She is alert.  Skin: Skin is warm, dry and intact. No rash noted.  Psychiatric: Her speech is normal and behavior is normal. Judgment and thought content normal. Her mood appears not anxious. Cognition and memory are normal. She does not exhibit a depressed mood.          Assessment & Plan:

## 2016-09-20 NOTE — Assessment & Plan Note (Signed)
Treat with nasal steroid and nasal saline irrigation.

## 2016-10-15 ENCOUNTER — Encounter: Payer: Self-pay | Admitting: Family Medicine

## 2016-10-18 MED ORDER — SERTRALINE HCL 25 MG PO TABS: 75.0000 mg | ORAL_TABLET | Freq: Every day | ORAL | 1 refills | Status: DC

## 2016-10-30 ENCOUNTER — Other Ambulatory Visit: Payer: Self-pay | Admitting: Student

## 2016-10-30 DIAGNOSIS — M533 Sacrococcygeal disorders, not elsewhere classified: Secondary | ICD-10-CM

## 2016-11-06 ENCOUNTER — Other Ambulatory Visit: Payer: Self-pay | Admitting: Family Medicine

## 2016-11-08 ENCOUNTER — Other Ambulatory Visit: Payer: Self-pay | Admitting: Student

## 2016-11-08 ENCOUNTER — Ambulatory Visit
Admission: RE | Admit: 2016-11-08 | Discharge: 2016-11-08 | Disposition: A | Discharge status: Home / Self Care | Payer: Managed Care, Other (non HMO) | Source: Ambulatory Visit | Attending: Student | Admitting: Student

## 2016-11-08 ENCOUNTER — Ambulatory Visit: Admission: RE | Admit: 2016-11-08 | Payer: Managed Care, Other (non HMO) | Source: Ambulatory Visit

## 2016-11-08 ENCOUNTER — Ambulatory Visit: Payer: Managed Care, Other (non HMO)

## 2016-11-08 DIAGNOSIS — M533 Sacrococcygeal disorders, not elsewhere classified: Secondary | ICD-10-CM

## 2016-11-08 HISTORY — DX: Essential (primary) hypertension: I10

## 2016-11-08 HISTORY — DX: Myoneural disorder, unspecified: G70.9

## 2016-11-08 LAB — PREGNANCY, URINE: Preg Test, Ur: NEGATIVE

## 2016-11-08 MED ORDER — ROPIVACAINE HCL 5 MG/ML IJ SOLN
INTRAMUSCULAR | Status: AC
  Filled 2016-11-08: qty 40

## 2016-11-08 MED ORDER — TRIAMCINOLONE ACETONIDE 40 MG/ML IJ SUSP
INTRAMUSCULAR | Status: AC
  Filled 2016-11-08: qty 2

## 2016-11-22 ENCOUNTER — Ambulatory Visit (INDEPENDENT_AMBULATORY_CARE_PROVIDER_SITE_OTHER): Payer: Managed Care, Other (non HMO) | Admitting: Family Medicine

## 2016-11-22 ENCOUNTER — Encounter: Payer: Self-pay | Admitting: Family Medicine

## 2016-11-22 DIAGNOSIS — R7303 Prediabetes: Secondary | ICD-10-CM

## 2016-11-22 NOTE — Assessment & Plan Note (Signed)
Discussed low-carb diet 

## 2016-11-22 NOTE — Progress Notes (Signed)
   Subjective:    Patient ID: Andrea Colon, female    DOB: 1976-10-02, 40 y.o.   MRN: [id number]  HPI   40 year old female presents for weight management.   At last OV in 08/2016  Lifestyle changes were reviewed and a plan for weight loss set up.  Wt Readings from Last 3 Encounters:  11/22/16 264 lb 4 oz (119.9 kg)  11/08/16 260 lb (117.9 kg)  09/20/16 260 lb 4 oz (118 kg)   Since that OV she has not been working very hard on lifestyle changes.  She has been exercsising minimally. Over thanksgiving ate too much.   non fasting glucose at work was 140.  Review of Systems  Constitutional: Negative for fatigue and fever.  HENT: Negative for ear pain.   Eyes: Negative for pain.  Respiratory: Negative for chest tightness and shortness of breath.   Cardiovascular: Negative for chest pain, palpitations and leg swelling.  Gastrointestinal: Negative for abdominal pain.  Genitourinary: Negative for dysuria.       Objective:   Physical Exam  Constitutional: Vital signs are normal. She appears well-developed and well-nourished. She is cooperative.  Non-toxic appearance. She does not appear ill. No distress.  Morbidly obese  HENT:  Head: Normocephalic.  Right Ear: Hearing, tympanic membrane, external ear and ear canal normal. Tympanic membrane is not erythematous, not retracted and not bulging.  Left Ear: Hearing, tympanic membrane, external ear and ear canal normal. Tympanic membrane is not erythematous, not retracted and not bulging.  Nose: No mucosal edema or rhinorrhea. Right sinus exhibits no maxillary sinus tenderness and no frontal sinus tenderness. Left sinus exhibits no maxillary sinus tenderness and no frontal sinus tenderness.  Mouth/Throat: Uvula is midline, oropharynx is clear and moist and mucous membranes are normal.  Eyes: Conjunctivae, EOM and lids are normal. Pupils are equal, round, and reactive to light. Lids are everted and swept, no foreign bodies found.  Neck:  Trachea normal and normal range of motion. Neck supple. Carotid bruit is not present. No thyroid mass and no thyromegaly present.  Cardiovascular: Normal rate, regular rhythm, S1 normal, S2 normal, normal heart sounds, intact distal pulses and normal pulses.  Exam reveals no gallop and no friction rub.   No murmur heard. Pulmonary/Chest: Effort normal and breath sounds normal. No tachypnea. No respiratory distress. She has no decreased breath sounds. She has no wheezes. She has no rhonchi. She has no rales.  Abdominal: Soft. Normal appearance and bowel sounds are normal. There is no tenderness.  Neurological: She is alert.  Skin: Skin is warm, dry and intact. No rash noted.  Psychiatric: Her speech is normal and behavior is normal. Judgment and thought content normal. Her mood appears not anxious. Cognition and memory are normal. She does not exhibit a depressed mood.          Assessment & Plan:

## 2016-11-22 NOTE — Patient Instructions (Signed)
Decreasing sweet beverage.. Sweet tea. Change to  Water and unsweet tea. Bring you lunch.  This weekend  Put together the bike.  Start bike once a week 30 min.

## 2016-11-22 NOTE — Progress Notes (Signed)
Pre visit review using our clinic review tool, if applicable. No additional management support is needed unless otherwise documented below in the visit note. 

## 2016-11-22 NOTE — Assessment & Plan Note (Signed)
Set out goal plan.  Will make 3 goals in next 3 months.  decreased sweet beverages and bring luches.  Gradually increase exercise ( put together exercise bike)  Small goals to avoid being overwhelmed. Do not weight .Marland Kitchen Make changes for health not numbers on scale.  Spent 25 min counseling as above face to face.

## 2016-12-03 ENCOUNTER — Other Ambulatory Visit: Payer: Self-pay | Admitting: Family Medicine

## 2016-12-09 ENCOUNTER — Encounter: Payer: Self-pay | Admitting: Family Medicine

## 2017-01-21 ENCOUNTER — Encounter: Payer: Self-pay | Admitting: Certified Nurse Midwife

## 2017-01-21 ENCOUNTER — Ambulatory Visit (INDEPENDENT_AMBULATORY_CARE_PROVIDER_SITE_OTHER): Payer: Managed Care, Other (non HMO) | Admitting: Certified Nurse Midwife

## 2017-01-21 VITALS — BP 103/81 | HR 90 | Ht 64.0 in | Wt 272.2 lb

## 2017-01-21 DIAGNOSIS — N939 Abnormal uterine and vaginal bleeding, unspecified: Secondary | ICD-10-CM | POA: Diagnosis not present

## 2017-01-21 NOTE — Progress Notes (Signed)
GYN ENCOUNTER NOTE  Subjective:       Andrea Colon is a 41 y.o. G1P1 female is here for gynecologic evaluation of the following issues abnormal uterine bleeding.   [name] reports irregular prolonged vaginal bleeding for the last two months. She started what she assumed was her menstrual cycle on November 21, 2016 and bleed continuously until December 11, 2016. Her bleeding and clots required her to wear large pads and changed them every two to three hours. Most clots were the size of a silver dollar.   She reports another episode of prolonged vaginal bleeding from January 01, 2017 until January 20, 2017.   Andrea Colon also reports periods of dizziness, but she does not know if it is due to her bleeding or her medications.   Carries states that her periods were regular prior to these bleeding episodes. She has gained approximately 40 pounds in the last year since starting new medications. She does not know when her mother went through menopause.   She denies difficulty breathing or respiratory distress, chest pain, abdominal pain, and leg pain or swelling.   History is significant for PCOS, hypertension, high triglycerides, depression, and prediabetes. She is a former smoker.    Pt states she chose the Paragard IUD, because "she does not like hormones".   Gynecologic History  Patient's last menstrual period was 01/01/2017.   Contraception: IUD  Obstetric History OB History  Gravida Para Term Preterm AB Living  1 1       1   SAB TAB Ectopic Multiple Live Births               # Outcome Date GA Lbr Len/2nd Weight Sex Delivery Anes PTL Lv  1 Para             Obstetric Comments  1st Menstrual Cycle:  11  1st Pregnancy:  36    Past Medical History:  Diagnosis Date  . Acne   . Hypertension   . 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 APPENDECTOMY     2005   . CESAREAN SECTION  11/17/12  . CHOLECYSTECTOMY   03/31/14  . DILATION AND CURETTAGE OF UTERUS  07/2010    Current Outpatient Prescriptions on File Prior to Visit  Medication Sig Dispense Refill  . losartan (COZAAR) 100 MG tablet Take 1 tablet (100 mg total) by mouth daily. 30 tablet 11  . sertraline (ZOLOFT) 25 MG tablet TAKE 3 TABLETS (75 MG TOTAL) BY MOUTH AT BEDTIME. 90 tablet 5  . spironolactone (ALDACTONE) 50 MG tablet Take 50 mg by mouth 2 (two) times daily.      No current facility-administered medications on file prior to visit.     Allergies  Allergen Reactions  . Sulfa Antibiotics Hives    Social History   Social History  . Marital status: Married    Spouse name: N/A  . Number of children: N/A  . Years of education: N/A   Occupational History  . 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 Smoking status: Former Smoker    Packs/day: 1.00    Years: 10.00    Types: Cigarettes    Quit date: 02/09/2016  . Smokeless tobacco: Never Used  . Alcohol use No  . Drug use: No  . Sexual activity: No   Other Topics Concern  . Not on file   Social History Narrative  .  No narrative on file    Family History  Problem Relation Age of Onset  . Family history unknown: Yes    The following portions of the patient's history were reviewed and updated as appropriate: allergies, current medications, past family history, past medical history, past social history, past surgical history and problem list.  Review of Systems  Review of Systems - History obtained from the patient General ROS: negative positive for  - fatigue Gastrointestinal ROS: no abdominal pain, change in bowel habits, or black or bloody stools Genito-Urinary ROS: negative positive for - irregular/heavy menses Neurological ROS: positive for - dizziness   Objective:   BP 103/81   Pulse 90   Ht 5\' 4"  (1.626 m)   Wt 272 lb 3.2 oz (123.5 kg)   LMP 01/01/2017 Comment: irreg. bleeding  BMI 46.72 kg/m    CONSTITUTIONAL:  Well-developed, well-nourished female in no acute distress.   [place]: Alert and oriented to person, place, and time.   PSYCHIATRIC: Normal mood and affect. Normal behavior. Normal judgment and thought content.  ABDOMEN: Soft, obese, non tender.  No Organomegaly.  PELVIC:  External Genitalia: Normal  Vagina: Normal, tan discharge noted  Cervix: Normal, IUD stings visible  Uterus: Unable to palpate  Adnexa: Unable to palpate    Assessment:   1. Abnormal uterine bleeding  - CBC - Estradiol - Ferritin - TSH - Vitamin B12 - US Transvaginal Non-OB; Future - US Pelvis Complete; Future  Plan:   1. Lab work today, see orders. Pt request vitamin B12 level with today lab draw.   2. RTC for [place] and endometrial biopsy with Dr.   Garald Braver. Discussed Provera challenge. Pt declines at this time and would like to proceed with lab work, ultrasound, and biopsy.    [name], CNM

## 2017-01-21 NOTE — Patient Instructions (Addendum)
Diet for Polycystic Ovarian Syndrome Introduction Polycystic ovary syndrome (PCOS) is a disorder of the chemical messengers (hormones) that regulate menstruation. The condition causes important hormones to be out of balance. PCOS can:  Make your periods irregular or stop.  Cause cysts to develop on the ovaries.  Make it difficult to get pregnant.  Stop your body from responding to the effects of insulin (insulin resistance), which can lead to obesity and diabetes. Changing what you eat can help manage PCOS and improve your health. It can help you lose weight and improve the way your body uses insulin. What is my plan?  Eat breakfast, lunch, and dinner plus two snacks every day.  Include protein in each meal and snack.  Choose whole grains instead of products made with refined flour.  Eat a variety of foods.  Exercise regularly as told by your health care provider. What do I need to know about this eating plan? If you are overweight or obese, pay attention to how many calories you eat. Cutting down on calories can help you lose weight. Work with your health care provider or dietitian to figure out how many calories you need each day. What foods can I eat? Grains  Whole grains, such as whole wheat. Whole-grain breads, crackers, cereals, and pasta. Unsweetened oatmeal, bulgur, barley, quinoa, or brown rice. Corn or whole-wheat flour tortillas. Vegetables  Lettuce. Spinach. Peas. Beets. Cauliflower. Cabbage. Broccoli. Carrots. Tomatoes. Squash. Eggplant. Herbs. Peppers. Onions. Cucumbers. Brussels sprouts. Fruits  Berries. Bananas. Apples. Oranges. Grapes. Papaya. Mango. Pomegranate. Kiwi. Grapefruit. Cherries. Meats and Other Protein Sources  Lean proteins, such as fish, chicken, beans, eggs, and tofu. Dairy  Low-fat dairy products, such as skim milk, cheese sticks, and yogurt. Beverages  Low-fat or fat-free drinks, such as water, low-fat milk, sugar-free drinks, and 100% fruit  juice. Condiments  Ketchup. Mustard. Barbecue sauce. Relish. Low-fat or fat-free mayonnaise. Fats and Oils  Olive oil or canola oil. Walnuts and almonds. The items listed above may not be a complete list of recommended foods or beverages. Contact your dietitian for more options.  What foods are not recommended? Foods high in calories or fat. Fried foods. Sweets. Products made from refined white flour, including white bread, pastries, white rice, and pasta. The items listed above may not be a complete list of foods and beverages to avoid. Contact your dietitian for more information.  This information is not intended to replace advice given to you by your health care provider. Make sure you discuss any questions you have with your health care provider. Document Released: 04/01/2016 Document Revised: 05/16/2016 Document Reviewed: 12/21/2014  2017 Elsevier  Endometrial Biopsy Endometrial biopsy is a procedure in which a tissue sample is taken from inside the uterus. The tissue sample is then looked at under a microscope to see if the tissue is normal or abnormal. The endometrium is the lining of the uterus. This procedure helps determine where you are in your menstrual cycle and how hormone levels are affecting the lining of the uterus. This procedure may also be used to evaluate uterine bleeding or to diagnose endometrial cancer, tuberculosis, polyps, or inflammatory conditions.  LET [facility] CARE PROVIDER KNOW ABOUT:  Any allergies you have.  All medicines you are taking, including vitamins, herbs, eye drops, creams, and over-the-counter medicines.  Previous problems you or members of your family have had with the use of anesthetics.  Any blood disorders you have.  Previous surgeries you have had.  Medical conditions you have.  Possibility of pregnancy. RISKS AND COMPLICATIONS Generally, this is a safe procedure. However, as with any procedure, complications can occur. Possible  complications include:  Bleeding.  Pelvic infection.  Puncture of the uterine wall with the biopsy device (rare). BEFORE THE PROCEDURE   Keep a record of your menstrual cycles as directed by your health care provider. You may need to schedule your procedure for a specific time in your cycle.  You may want to bring a sanitary pad to wear home after the procedure.  Arrange for someone to drive you home after the procedure if you will be given a medicine to help you relax (sedative). PROCEDURE   You may be given a sedative to relax you.  You will lie on an exam table with your feet and legs supported as in a pelvic exam.  Your health care provider will insert an instrument (speculum) into your vagina to see your cervix.  Your cervix will be cleansed with an antiseptic solution. A medicine (local anesthetic) will be used to numb the cervix.  A forceps instrument (tenaculum) will be used to hold your cervix steady for the biopsy.  A thin, rodlike instrument (uterine sound) will be inserted through your cervix to determine the length of your uterus and the location where the biopsy sample will be removed.  A thin, flexible tube (catheter) will be inserted through your cervix and into the uterus. The catheter is used to collect the biopsy sample from your endometrial tissue.  The catheter and speculum will then be removed, and the tissue sample will be sent to a lab for examination. AFTER THE PROCEDURE  You will rest in a recovery area until you are ready to go home.  You may have mild cramping and a small amount of vaginal bleeding for a few days after the procedure. This is normal.  Make sure you find out how to get your test results. This information is not intended to replace advice given to you by your health care provider. Make sure you discuss any questions you have with your health care provider. Document Released: 04/11/2005 Document Revised: 08/11/2013 Document Reviewed:  05/26/2013 Elsevier Interactive Patient Education  2017 [employer].

## 2017-01-21 NOTE — Progress Notes (Signed)
Patient ID: Andrea Colon, female   DOB: October 23, 1976, 41 y.o.   MRN: [id number] New pt presents for irregular vaginal bleeding for the past 2 months, cycle of 20-21 days. Has had vaginal bleeding for past 2 months with clots. No severe pain. Has IUD (paragard).

## 2017-01-22 ENCOUNTER — Ambulatory Visit (INDEPENDENT_AMBULATORY_CARE_PROVIDER_SITE_OTHER): Payer: Managed Care, Other (non HMO) | Admitting: Family Medicine

## 2017-01-22 ENCOUNTER — Encounter: Payer: Self-pay | Admitting: Family Medicine

## 2017-01-22 ENCOUNTER — Ambulatory Visit: Payer: Self-pay | Admitting: Family Medicine

## 2017-01-22 VITALS — BP 102/66 | HR 104 | Temp 99.1°F | Ht 64.0 in | Wt 269.5 lb

## 2017-01-22 DIAGNOSIS — J020 Streptococcal pharyngitis: Secondary | ICD-10-CM | POA: Diagnosis not present

## 2017-01-22 DIAGNOSIS — J029 Acute pharyngitis, unspecified: Secondary | ICD-10-CM

## 2017-01-22 LAB — POCT RAPID STREP A (OFFICE): RAPID STREP A SCREEN: POSITIVE — AB

## 2017-01-22 LAB — TSH: TSH: 2.07 u[IU]/mL (ref 0.450–4.500)

## 2017-01-22 LAB — VITAMIN B12: VITAMIN B 12: 440 pg/mL (ref 232–1245)

## 2017-01-22 LAB — ESTRADIOL: ESTRADIOL: 36.5 pg/mL

## 2017-01-22 LAB — FERRITIN: FERRITIN: 24 ng/mL (ref 15–150)

## 2017-01-22 MED ORDER — AMOXICILLIN 500 MG PO CAPS: 500.0000 mg | ORAL_CAPSULE | Freq: Three times a day (TID) | ORAL | 0 refills | Status: DC

## 2017-01-22 NOTE — Progress Notes (Signed)
Pre visit review using our clinic review tool, if applicable. No additional management support is needed unless otherwise documented below in the visit note. 

## 2017-01-22 NOTE — Progress Notes (Signed)
Subjective:    Patient ID: Andrea Colon, female    DOB: October 02, 1976, 41 y.o.   MRN: [id number]  HPI Here for ST and cough -started this am   Pos RST today   Has been passing around illness -in family  Husband had it recently  ST is quite severe - hurts to swallow Also a bit of a cough (feels like from throat/drainage)  No rash  Took some aleve in the middle of the night - and ? If fever  No aches or chills     Temp: 99.1 F (37.3 C)   Results for orders placed or performed in visit on 01/22/17  POCT rapid strep A  Result Value Ref Range   Rapid Strep A Screen Positive (A) Negative    Patient Active Problem List   Diagnosis Date Noted  . Strep pharyngitis 01/22/2017  . Eustachian tube dysfunction 09/20/2016  . Vagina, candidiasis 08/23/2016  . Low back pain without sciatica 04/16/2016  . Acne vulgaris 10/10/2015  . PVC's (premature ventricular contractions) 08/22/2015  . Tobacco use disorder 09/30/2014  . Chronic insomnia 07/07/2014  . Eczema, dyshidrotic 07/07/2014  . Generalized anxiety disorder 04/26/2014  . History of gallstones 01/21/2014  . History of hepatic disease 01/21/2014  . Morbid obesity ([place]) 10/19/2013  . Depression, major, recurrent, mild ([place]) 11/22/2011  . Major depressive disorder, single episode, in remission ([place]) 11/22/2011  . High triglycerides 05/21/2011  . Prediabetes 05/21/2011  . Other abnormal glucose 05/21/2011  . POLYCYSTIC OVARIAN DISEASE 02/05/2011  . Essential hypertension, benign 02/05/2011  . PALPITATIONS, RECURRENT 10/31/2008   Past Medical History:  Diagnosis Date  . Acne   . Hypertension   . 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 APPENDECTOMY     2005   . CESAREAN SECTION  11/17/12  . CHOLECYSTECTOMY  03/31/14  . DILATION AND CURETTAGE OF UTERUS  07/2010   Social History  Substance Use Topics  . Smoking status: Former Smoker    Packs/day:  1.00    Years: 10.00    Types: Cigarettes    Quit date: 02/09/2016  . Smokeless tobacco: Never Used  . Alcohol use No   Family History  Problem Relation Age of Onset  . Family history unknown: Yes   Allergies  Allergen Reactions  . Sulfa Antibiotics Hives   Current Outpatient Prescriptions on File Prior to Visit  Medication Sig Dispense Refill  . losartan (COZAAR) 100 MG tablet Take 1 tablet (100 mg total) by mouth daily. 30 tablet 11  . PARAGARD INTRAUTERINE COPPER IU by Intrauterine route.    . sertraline (ZOLOFT) 25 MG tablet TAKE 3 TABLETS (75 MG TOTAL) BY MOUTH AT BEDTIME. 90 tablet 5  . spironolactone (ALDACTONE) 50 MG tablet Take 50 mg by mouth 2 (two) times daily.      No current facility-administered medications on file prior to visit.      Review of Systems Review of Systems  Constitutional: Negative for fever, appetite change, and unexpected weight change.  Eyes: Negative for pain and visual disturbance.  ENT pos for ST and pnd / neg for sinus pain/ pos for ear fullness Respiratory: Negative for wheeze and shortness of breath.   Cardiovascular: Negative for cp or palpitations    Gastrointestinal: Negative for nausea, diarrhea and constipation.  Genitourinary: Negative for urgency and frequency.  Skin: Negative for pallor or rash   Neurological: Negative for weakness, light-headedness,  numbness and headaches.  Hematological: Negative for adenopathy. Does not bruise/bleed easily.  Psychiatric/Behavioral: Negative for dysphoric mood. The patient is not nervous/anxious.         Objective:   Physical Exam  Constitutional: She appears well-developed and well-nourished. No distress.  obese and well appearing   HENT:  Head: Normocephalic and atraumatic.  Right Ear: External ear normal.  Left Ear: External ear normal.  Mouth/Throat: No oropharyngeal exudate.  Diffuse throat erythema with inflammed tonsils  No exudate  Nares boggy  No sinus tenderness Mild L TM  effusion  Eyes: Conjunctivae and EOM are normal. Pupils are equal, round, and reactive to light. Right eye exhibits no discharge. Left eye exhibits no discharge.  Neck: Normal range of motion. Neck supple.  Cardiovascular: Normal rate, regular rhythm and normal heart sounds.   Pulmonary/Chest: Effort normal and breath sounds normal. No respiratory distress. She has no wheezes. She has no rales.  Good air exch  Lymphadenopathy:    She has no cervical adenopathy.  Neurological: She is alert. No cranial nerve deficit.  Skin: Skin is warm and dry. No rash noted. No erythema.  Psychiatric: She has a normal mood and affect.          Assessment & Plan:   Problem List Items Addressed This Visit      Respiratory   Strep pharyngitis    With ST and elevated temp  Cough may be left over from a prev uri  Cover with amoxicillin 500 mg tid for 7d No work for at least 24 h after start of med Disc symptomatic care - see instructions on AVS  Update if not starting to improve in a week or if worsening         Other Visit Diagnoses    Sore throat    -  Primary   Relevant Orders   POCT rapid strep A (Completed)

## 2017-01-22 NOTE — Patient Instructions (Addendum)
You have strep throat  Drink lots of fluids  If you need pain or fever relief acetaminophen or aleve are ok (aleve take with food) as directed  Rest  Take the amoxicillin as directed  Update if not starting to improve in a week or if worsening      Strep Throat Strep throat is a bacterial infection of the throat. Your health care provider may call the infection tonsillitis or pharyngitis, depending on whether there is swelling in the tonsils or at the back of the throat. Strep throat is most common during the cold months of the year in children who are 24-48 years of age, but it can happen during any season in people of any age. This infection is spread from person to person (contagious) through coughing, sneezing, or close contact. What are the causes? Strep throat is caused by the bacteria called Streptococcus pyogenes. What increases the risk? This condition is more likely to develop in:  People who spend time in crowded places where the infection can spread easily.  People who have close contact with someone who has strep throat. What are the signs or symptoms? Symptoms of this condition include:  Fever or chills.  Redness, swelling, or pain in the tonsils or throat.  Pain or difficulty when swallowing.  White or yellow spots on the tonsils or throat.  Swollen, tender glands in the neck or under the jaw.  Red rash all over the body (rare). How is this diagnosed? This condition is diagnosed by performing a rapid strep test or by taking a swab of your throat (throat culture test). Results from a rapid strep test are usually ready in a few minutes, but throat culture test results are available after one or two days. How is this treated? This condition is treated with antibiotic medicine. Follow these instructions at home: Medicines  Take over-the-counter and prescription medicines only as told by your health care provider.  Take your antibiotic as told by your health care  provider. Do not stop taking the antibiotic even if you start to feel better.  Have family members who also have a sore throat or fever tested for strep throat. They may need antibiotics if they have the strep infection. Eating and drinking  Do not share food, drinking cups, or personal items that could cause the infection to spread to other people.  If swallowing is difficult, try eating soft foods until your sore throat feels better.  Drink enough fluid to keep your urine clear or pale yellow. General instructions  Gargle with a salt-water mixture 3-4 times per day or as needed. To make a salt-water mixture, completely dissolve -1 tsp of salt in 1 cup of warm water.  Make sure that all household members wash their hands well.  Get plenty of rest.  Stay home from school or work until you have been taking antibiotics for 24 hours.  Keep all follow-up visits as told by your health care provider. This is important. Contact a health care provider if:  The glands in your neck continue to get bigger.  You develop a rash, cough, or earache.  You cough up a thick liquid that is green, yellow-brown, or bloody.  You have pain or discomfort that does not get better with medicine.  Your problems seem to be getting worse rather than better.  You have a fever. Get help right away if:  You have new symptoms, such as vomiting, severe headache, stiff or painful neck, chest pain, or  shortness of breath.  You have severe throat pain, drooling, or changes in your voice.  You have swelling of the neck, or the skin on the neck becomes red and tender.  You have signs of dehydration, such as fatigue, dry mouth, and decreased urination.  You become increasingly sleepy, or you cannot wake up completely.  Your joints become red or painful. This information is not intended to replace advice given to you by your health care provider. Make sure you discuss any questions you have with your health  care provider. Document Released: 12/06/2000 Document Revised: 08/07/2016 Document Reviewed: 04/03/2015 Elsevier Interactive Patient Education  2017 [employer].

## 2017-01-22 NOTE — Assessment & Plan Note (Signed)
With ST and elevated temp  Cough may be left over from a prev [employer] with amoxicillin 500 mg tid for 7d No work for at least 24 h after start of med Disc symptomatic care - see instructions on AVS  Update if not starting to improve in a week or if worsening

## 2017-01-23 ENCOUNTER — Ambulatory Visit: Payer: Self-pay | Admitting: Family Medicine

## 2017-01-24 LAB — NUSWAB VAGINITIS PLUS (VG+)
CANDIDA ALBICANS, NAA: NEGATIVE
CANDIDA GLABRATA, NAA: NEGATIVE
CHLAMYDIA TRACHOMATIS, NAA: NEGATIVE
NEISSERIA GONORRHOEAE, NAA: NEGATIVE
Trich vag by NAA: NEGATIVE

## 2017-02-03 ENCOUNTER — Telehealth: Payer: Self-pay | Admitting: Family Medicine

## 2017-02-03 ENCOUNTER — Encounter: Payer: Self-pay | Admitting: Family Medicine

## 2017-02-03 NOTE — Telephone Encounter (Signed)
Pt has appt with Allie Bossier NP on 02/04/17 at 8:45.

## 2017-02-03 NOTE — Telephone Encounter (Signed)
Patient Name: Andrea Colon  DOB: [birth year]    Initial Comment caller states she has sore throat and has white spots   Nurse Assessment  Nurse: Leilani Merl, RN, Heather Date/Time (Eastern Time): 02/03/2017 11:32:51 AM  Confirm and document reason for call. If symptomatic, describe symptoms. ---caller states she has sore throat and has white spots. This started last night.  Does the patient have any new or worsening symptoms? ---Yes  Will a triage be completed? ---Yes  Related visit to physician within the last 2 weeks? ---No  Does the PT have any chronic conditions? (i.e. diabetes, asthma, etc.) ---Yes  List chronic conditions. ---See MR  Is the patient pregnant or possibly pregnant? (Ask all females between the ages of 40-55) ---No  Is this a behavioral health or substance abuse call? ---No     Guidelines    Guideline Title Affirmed Question Affirmed Notes  Sore Throat SEVERE (e.g., excruciating) throat pain    Final Disposition User   See Physician within 24 Hours Stand[place], RN, Water quality scientist    Comments  Appt with Dr. Carlis Abbott tomorrow at 8:45 am.   Referrals  REFERRED TO PCP OFFICE   Disagree/Comply: Comply

## 2017-02-03 NOTE — Telephone Encounter (Signed)
Noted  

## 2017-02-04 ENCOUNTER — Ambulatory Visit (INDEPENDENT_AMBULATORY_CARE_PROVIDER_SITE_OTHER): Payer: Managed Care, Other (non HMO) | Admitting: Primary Care

## 2017-02-04 ENCOUNTER — Encounter: Payer: Self-pay | Admitting: Primary Care

## 2017-02-04 VITALS — BP 118/74 | HR 95 | Temp 98.1°F | Ht 64.0 in | Wt 268.8 lb

## 2017-02-04 DIAGNOSIS — Z20828 Contact with and (suspected) exposure to other viral communicable diseases: Secondary | ICD-10-CM | POA: Diagnosis not present

## 2017-02-04 DIAGNOSIS — J020 Streptococcal pharyngitis: Secondary | ICD-10-CM | POA: Diagnosis not present

## 2017-02-04 LAB — POC INFLUENZA A&B (BINAX/QUICKVUE)
Influenza A, POC: NEGATIVE
Influenza B, POC: NEGATIVE

## 2017-02-04 LAB — POCT RAPID STREP A (OFFICE): RAPID STREP A SCREEN: POSITIVE — AB

## 2017-02-04 MED ORDER — AMOXICILLIN-POT CLAVULANATE 875-125 MG PO TABS: 1.0000 | ORAL_TABLET | Freq: Two times a day (BID) | ORAL | 0 refills | Status: DC

## 2017-02-04 MED ORDER — BENZONATATE 200 MG PO CAPS: 200.0000 mg | ORAL_CAPSULE | Freq: Three times a day (TID) | ORAL | 0 refills | Status: DC | PRN

## 2017-02-04 MED ORDER — METHYLPREDNISOLONE ACETATE 80 MG/ML IJ SUSP
80.0000 mg | Freq: Once | INTRAMUSCULAR | Status: AC
  Administered 2017-02-04: 80 mg via INTRAMUSCULAR

## 2017-02-04 MED ORDER — OSELTAMIVIR PHOSPHATE 75 MG PO CAPS: 75.0000 mg | ORAL_CAPSULE | Freq: Every day | ORAL | 0 refills | Status: DC

## 2017-02-04 NOTE — Addendum Note (Signed)
Addended by: Jacqualin Combes on: 02/04/2017 10:28 AM   Modules accepted: Orders

## 2017-02-04 NOTE — Progress Notes (Signed)
Subjective:    Patient ID: Andrea Colon, female    DOB: December 19, 1976, 40 y.o.   MRN: [id number]  HPI  Andrea Colon is a 41 year old female who presents today with a chief complaint of sore throat. She also reports cough hoarse voice. She was evaluated and treated on 01/22/17 for strep pharyngitis which was her second treatment since Christmas. She was treated with Amoxicillin 500 mg TID for 7 days and completed this course.  Two days ago she started coughing and developed a sore throat with inflammation. Her son was diagnosed with influenza yesterday. Denies fevers, sinus pressure. She's taken Aleve for her symptoms with some improvement.   Review of Systems  Constitutional: Positive for fatigue. Negative for chills and fever.  HENT: Positive for congestion and sore throat. Negative for ear pain and sinus pressure.   Respiratory: Positive for cough. Negative for shortness of breath.   Cardiovascular: Negative for chest pain.       Past Medical History:  Diagnosis Date  . Acne   . Hypertension   . 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 Marital status: Married    Spouse name: N/A  . Number of children: N/A  . Years of education: N/A   Occupational History  . 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 Smoking status: Former Smoker    Packs/day: 1.00    Years: 10.00    Types: Cigarettes    Quit date: 02/09/2016  . Smokeless tobacco: Never Used  . Alcohol use No  . Drug use: No  . Sexual activity: No   Other Topics Concern  . Not on file   Social History Narrative  . No narrative on file    Past Surgical History:  Procedure Laterality Date  . APPENDECTOMY     2005   . CESAREAN SECTION  11/17/12  . CHOLECYSTECTOMY  03/31/14  . DILATION AND CURETTAGE OF UTERUS  07/2010    Family History  Problem Relation Age of Onset  . Family history unknown: Yes    Allergies  Allergen  Reactions  . Sulfa Antibiotics Hives    Current Outpatient Prescriptions on File Prior to Visit  Medication Sig Dispense Refill  . losartan (COZAAR) 100 MG tablet Take 1 tablet (100 mg total) by mouth daily. 30 tablet 11  . PARAGARD INTRAUTERINE COPPER IU by Intrauterine route.    . sertraline (ZOLOFT) 25 MG tablet TAKE 3 TABLETS (75 MG TOTAL) BY MOUTH AT BEDTIME. 90 tablet 5  . spironolactone (ALDACTONE) 50 MG tablet Take 50 mg by mouth 2 (two) times daily.      No current facility-administered medications on file prior to visit.     BP 118/74   Pulse 95   Temp 98.1 F (36.7 C) (Oral)   Ht 5\' 4"  (1.626 m)   Wt 268 lb 12.8 oz (121.9 kg)   LMP 01/21/2017   SpO2 98%   BMI 46.14 kg/m    Objective:   Physical Exam  Constitutional: She appears well-nourished. She appears ill.  HENT:  Right Ear: Tympanic membrane and ear canal normal.  Left Ear: Tympanic membrane and ear canal normal.  Nose: Right sinus exhibits no maxillary sinus tenderness and no frontal sinus tenderness. Left sinus exhibits no maxillary sinus tenderness and no frontal sinus tenderness.  Mouth/Throat: Posterior oropharyngeal edema and posterior oropharyngeal erythema present. No oropharyngeal exudate.  Eyes: Conjunctivae are normal.  Neck: Neck supple.  Cardiovascular: Normal rate and regular rhythm.   Pulmonary/Chest: Effort normal and breath sounds normal. She has no wheezes. She has no rales.  Mild congestion that clears with cough  Lymphadenopathy:    She has no cervical adenopathy.  Skin: Skin is warm and dry.          Assessment & Plan:  Sore Throat:  Present for the past 2 days, exposure to influenza by her son. History of strep pharyngitis x 2 since Christmas 2017. Exam today with moderate erythema and edema to posterior pharynx, Rapid Strep: Positive Rapid Flu: Negative. Rx for Augmentin and Tessalon Pearls sent to pharmacy. Fluids, rest, IM Depo Medrol provided today for  inflammation.  Sheral Flow, NP

## 2017-02-04 NOTE — Patient Instructions (Addendum)
You tested negative for influenza and positive for strep.  Start Augmentin antibiotics. Take 1 tablet by mouth twice daily for 10 days.  You may take Benzonatate capsules for cough. Take 1 capsule by mouth three times daily as needed for cough.  You were provided with an injection of steroids to reduce inflammation in your throat.  Continue Aleve for pain and inflammation, refrain from taking any today.  It was a pleasure meeting you!

## 2017-02-04 NOTE — Progress Notes (Signed)
Pre visit review using our clinic review tool, if applicable. No additional management support is needed unless otherwise documented below in the visit note. 

## 2017-02-19 ENCOUNTER — Encounter: Payer: Managed Care, Other (non HMO) | Admitting: Obstetrics and Gynecology

## 2017-02-21 ENCOUNTER — Encounter: Payer: Managed Care, Other (non HMO) | Admitting: Obstetrics and Gynecology

## 2017-02-21 ENCOUNTER — Other Ambulatory Visit: Payer: Managed Care, Other (non HMO)

## 2017-02-21 ENCOUNTER — Encounter: Payer: Managed Care, Other (non HMO) | Admitting: Certified Nurse Midwife

## 2017-02-21 ENCOUNTER — Ambulatory Visit: Payer: Managed Care, Other (non HMO) | Admitting: Family Medicine

## 2017-02-24 ENCOUNTER — Ambulatory Visit (INDEPENDENT_AMBULATORY_CARE_PROVIDER_SITE_OTHER): Payer: Managed Care, Other (non HMO) | Admitting: Family Medicine

## 2017-02-24 ENCOUNTER — Encounter: Payer: Self-pay | Admitting: Family Medicine

## 2017-02-24 VITALS — BP 118/78 | HR 88 | Temp 98.6°F | Wt 267.5 lb

## 2017-02-24 DIAGNOSIS — J020 Streptococcal pharyngitis: Secondary | ICD-10-CM

## 2017-02-24 DIAGNOSIS — J029 Acute pharyngitis, unspecified: Secondary | ICD-10-CM

## 2017-02-24 DIAGNOSIS — J0301 Acute recurrent streptococcal tonsillitis: Secondary | ICD-10-CM

## 2017-02-24 LAB — POCT RAPID STREP A (OFFICE): RAPID STREP A SCREEN: POSITIVE — AB

## 2017-02-24 MED ORDER — AMOXICILLIN 500 MG PO CAPS: 500.0000 mg | ORAL_CAPSULE | Freq: Two times a day (BID) | ORAL | 0 refills | Status: DC

## 2017-02-24 NOTE — Progress Notes (Signed)
Pre visit review using our clinic review tool, if applicable. No additional management support is needed unless otherwise documented below in the visit note. 

## 2017-02-24 NOTE — Progress Notes (Signed)
   Subjective:    Patient ID: Andrea Colon, female    DOB: [birth year], 41 y.o.   MRN: [id number]  HPI This is a 41 yo female who presents today with two days of sore throat, cough with clear-white sputum. No fever. Some chills, no fever, occasional headache with cough, no ear pain.  She has had positive strep tests x 2- 01/22/17, 02/04/17, she has also been seen at urgent care x 2 with diagnosis of strep. Has had Zpack, augmentin and a course of penicillin.   Past Medical History:  Diagnosis Date  . Acne   . Hypertension   . Neuromuscular disorder ([place])    SI Joint inflamation   Past Surgical History:  Procedure Laterality Date  . APPENDECTOMY     2005   . CESAREAN SECTION  11/17/12  . CHOLECYSTECTOMY  03/31/14  . DILATION AND CURETTAGE OF UTERUS  07/2010   Family History  Problem Relation Age of Onset  . Family history unknown: Yes   Social History  Substance Use Topics  . Smoking status: Former Smoker    Packs/day: 1.00    Years: 10.00    Types: Cigarettes    Quit date: 02/09/2016  . Smokeless tobacco: Never Used  . Alcohol use No      Review of Systems Per HPI    Objective:   Physical Exam  Constitutional: She is oriented to person, place, and time. She appears well-developed and well-nourished. No distress.  HENT:  Head: Normocephalic and atraumatic.  Right Ear: Tympanic membrane, external ear and ear canal normal.  Left Ear: Tympanic membrane, external ear and ear canal normal.  Nose: Nose normal.  Mouth/Throat: Uvula is midline and mucous membranes are normal. Posterior oropharyngeal edema and posterior oropharyngeal erythema present. No oropharyngeal exudate.  +2 tonsils, multiple crevices, no exudate, very erythematous.   Eyes: Conjunctivae are normal.  Cardiovascular: Normal rate, regular rhythm and normal heart sounds.   Pulmonary/Chest: Effort normal and breath sounds normal.  Lymphadenopathy:    She has no cervical adenopathy.  Neurological: She is  alert and oriented to person, place, and time.  Skin: Skin is warm and dry. She is not diaphoretic.  Psychiatric: She has a normal mood and affect. Her behavior is normal. Judgment and thought content normal.  Vitals reviewed.     BP 118/78   Pulse 88   Temp 98.6 F (37 C) (Oral)   Wt 267 lb 8 oz (121.3 kg)   LMP 02/18/2017   BMI 45.92 kg/m  Wt Readings from Last 3 Encounters:  02/24/17 267 lb 8 oz (121.3 kg)  02/04/17 268 lb 12.8 oz (121.9 kg)  01/22/17 269 lb 8 oz (122.2 kg)       Assessment & Plan:  1. Sore throat - POCT rapid strep A - Culture, Group A Strep  2. Strep pharyngitis - amoxicillin (AMOXIL) 500 MG capsule; Take 1 capsule (500 mg total) by mouth 2 (two) times daily.  Dispense: 20 capsule; Refill: 0  3. Recurrent streptococcal tonsillitis - Ambulatory referral to ENT   Clarene Reamer, FNP-BC  [facility] [place], [facility]  02/24/2017 9:09 AM

## 2017-02-24 NOTE — Patient Instructions (Signed)
I have sent an antibiotic to your pharmacy and put in a referral to ENT If you get worse or have any difficulty swallowing, please let us know  Strep Throat Strep throat is a bacterial infection of the throat. Your health care provider may call the infection tonsillitis or pharyngitis, depending on whether there is swelling in the tonsils or at the back of the throat. Strep throat is most common during the cold months of the year in children who are 29-72 years of age, but it can happen during any season in people of any age. This infection is spread from person to person (contagious) through coughing, sneezing, or close contact. What are the causes? Strep throat is caused by the bacteria called Streptococcus pyogenes. What increases the risk? This condition is more likely to develop in:  People who spend time in crowded places where the infection can spread easily.  People who have close contact with someone who has strep throat. What are the signs or symptoms? Symptoms of this condition include:  Fever or chills.  Redness, swelling, or pain in the tonsils or throat.  Pain or difficulty when swallowing.  White or yellow spots on the tonsils or throat.  Swollen, tender glands in the neck or under the jaw.  Red rash all over the body (rare). How is this diagnosed? This condition is diagnosed by performing a rapid strep test or by taking a swab of your throat (throat culture test). Results from a rapid strep test are usually ready in a few minutes, but throat culture test results are available after one or two days. How is this treated? This condition is treated with antibiotic medicine. Follow these instructions at home: Medicines   Take over-the-counter and prescription medicines only as told by your health care provider.  Take your antibiotic as told by your health care provider. Do not stop taking the antibiotic even if you start to feel better.  Have family members who also  have a sore throat or fever tested for strep throat. They may need antibiotics if they have the strep infection. Eating and drinking   Do not share food, drinking cups, or personal items that could cause the infection to spread to other people.  If swallowing is difficult, try eating soft foods until your sore throat feels better.  Drink enough fluid to keep your urine clear or pale yellow. General instructions   Gargle with a salt-water mixture 3-4 times per day or as needed. To make a salt-water mixture, completely dissolve -1 tsp of salt in 1 cup of warm water.  Make sure that all household members wash their hands well.  Get plenty of rest.  Stay home from school or work until you have been taking antibiotics for 24 hours.  Keep all follow-up visits as told by your health care provider. This is important. Contact a health care provider if:  The glands in your neck continue to get bigger.  You develop a rash, cough, or earache.  You cough up a thick liquid that is green, yellow-brown, or bloody.  You have pain or discomfort that does not get better with medicine.  Your problems seem to be getting worse rather than better.  You have a fever. Get help right away if:  You have new symptoms, such as vomiting, severe headache, stiff or painful neck, chest pain, or shortness of breath.  You have severe throat pain, drooling, or changes in your voice.  You have swelling of the neck,  or the skin on the neck becomes red and tender.  You have signs of dehydration, such as fatigue, dry mouth, and decreased urination.  You become increasingly sleepy, or you cannot wake up completely.  Your joints become red or painful. This information is not intended to replace advice given to you by your health care provider. Make sure you discuss any questions you have with your health care provider. Document Released: 12/06/2000 Document Revised: 08/07/2016 Document Reviewed:  04/03/2015 Elsevier Interactive Patient Education  2017 [employer].

## 2017-02-26 LAB — CULTURE, GROUP A STREP

## 2017-03-18 ENCOUNTER — Encounter: Payer: Self-pay | Admitting: Obstetrics and Gynecology

## 2017-03-18 ENCOUNTER — Ambulatory Visit (INDEPENDENT_AMBULATORY_CARE_PROVIDER_SITE_OTHER): Payer: Managed Care, Other (non HMO) | Admitting: Obstetrics and Gynecology

## 2017-03-18 ENCOUNTER — Ambulatory Visit (INDEPENDENT_AMBULATORY_CARE_PROVIDER_SITE_OTHER): Payer: Managed Care, Other (non HMO)

## 2017-03-18 VITALS — BP 120/80 | HR 93 | Ht 64.0 in | Wt 266.6 lb

## 2017-03-18 DIAGNOSIS — N83209 Unspecified ovarian cyst, unspecified side: Secondary | ICD-10-CM

## 2017-03-18 DIAGNOSIS — Z975 Presence of (intrauterine) contraceptive device: Secondary | ICD-10-CM | POA: Diagnosis not present

## 2017-03-18 DIAGNOSIS — N939 Abnormal uterine and vaginal bleeding, unspecified: Secondary | ICD-10-CM | POA: Diagnosis not present

## 2017-03-18 DIAGNOSIS — N921 Excessive and frequent menstruation with irregular cycle: Secondary | ICD-10-CM

## 2017-03-18 NOTE — Progress Notes (Signed)
HPI:      Andrea Colon is a 41 y.o. G1P1001 who LMP was Patient's last menstrual period was 02/16/2017 (exact date).  Subjective:   She presents today With 2 issues. The first is that she has had heavy menstrual bleeding lasting 20 days on 2 separate occasions in the last 3 months. She has significant cramping and long menses otherwise on her ParaGard IUD. Her second issue is that she was found to have a large simple ovarian cyst. She has no symptoms from this.  She is strongly considering a change in birth control method.    Hx: The following portions of the patient's history were reviewed and updated as appropriate:              She  has a past medical history of Acne; Hypertension; and Neuromuscular disorder ([place]). She  does not have any pertinent problems on file. She  has a past surgical history that includes Appendectomy; Cesarean section (11/17/12); Dilation and curettage of uterus (07/2010); and Cholecystectomy (03/31/14). Her Family history is unknown by patient. She  reports that she quit smoking about 13 months ago. Her smoking use included Cigarettes. She has a 10.00 pack-year smoking history. She has never used smokeless tobacco. She reports that she does not drink alcohol or use drugs. Current Outpatient Prescriptions on File Prior to Visit  Medication Sig Dispense Refill  . losartan (COZAAR) 100 MG tablet Take 1 tablet (100 mg total) by mouth daily. 30 tablet 11  . PARAGARD INTRAUTERINE COPPER IU by Intrauterine route.    . sertraline (ZOLOFT) 25 MG tablet TAKE 3 TABLETS (75 MG TOTAL) BY MOUTH AT BEDTIME. 90 tablet 5  . spironolactone (ALDACTONE) 50 MG tablet Take 50 mg by mouth 2 (two) times daily.      No current facility-administered medications on file prior to visit.          Review of Systems:  Review of Systems  Constitutional: Denied constitutional symptoms, night sweats, recent illness, fatigue, fever, insomnia and weight loss.  Eyes: Denied eye symptoms,  eye pain, photophobia, vision change and visual disturbance.  Ears/Nose/Throat/Neck: Denied ear, nose, throat or neck symptoms, hearing loss, nasal discharge, sinus congestion and sore throat.  Cardiovascular: Denied cardiovascular symptoms, arrhythmia, chest pain/pressure, edema, exercise intolerance, orthopnea and palpitations.  Respiratory: Denied pulmonary symptoms, asthma, pleuritic pain, productive sputum, cough, dyspnea and wheezing.  Gastrointestinal: Denied, gastro-esophageal reflux, melena, nausea and vomiting.  Genitourinary: See HPI for additional information.  Musculoskeletal: Denied musculoskeletal symptoms, stiffness, swelling, muscle weakness and myalgia.  Dermatologic: Denied dermatology symptoms, rash and scar.  Neurologic: Denied neurology symptoms, dizziness, headache, neck pain and syncope.  Psychiatric: Denied psychiatric symptoms, anxiety and depression.  Endocrine: Denied endocrine symptoms including hot flashes and night sweats.   Meds:   Current Outpatient Prescriptions on File Prior to Visit  Medication Sig Dispense Refill  . losartan (COZAAR) 100 MG tablet Take 1 tablet (100 mg total) by mouth daily. 30 tablet 11  . PARAGARD INTRAUTERINE COPPER IU by Intrauterine route.    . sertraline (ZOLOFT) 25 MG tablet TAKE 3 TABLETS (75 MG TOTAL) BY MOUTH AT BEDTIME. 90 tablet 5  . spironolactone (ALDACTONE) 50 MG tablet Take 50 mg by mouth 2 (two) times daily.      No current facility-administered medications on file prior to visit.     Objective:     Vitals:   03/18/17 1114  BP: 120/80  Pulse: 93  Ultrasound results reviewed directly with the patient.  Assessment:    G1P1001 Patient Active Problem List   Diagnosis Date Noted  . Strep pharyngitis 01/22/2017  . Eustachian tube dysfunction 09/20/2016  . Vagina, candidiasis 08/23/2016  . Low back pain without sciatica 04/16/2016  . Acne vulgaris 10/10/2015  . PVC's (premature ventricular  contractions) 08/22/2015  . Tobacco use disorder 09/30/2014  . Chronic insomnia 07/07/2014  . Eczema, dyshidrotic 07/07/2014  . Generalized anxiety disorder 04/26/2014  . History of gallstones 01/21/2014  . History of hepatic disease 01/21/2014  . Morbid obesity ([place]) 10/19/2013  . Depression, major, recurrent, mild ([place]) 11/22/2011  . Major depressive disorder, single episode, in remission ([place]) 11/22/2011  . High triglycerides 05/21/2011  . Prediabetes 05/21/2011  . Other abnormal glucose 05/21/2011  . POLYCYSTIC OVARIAN DISEASE 02/05/2011  . Essential hypertension, benign 02/05/2011  . PALPITATIONS, RECURRENT 10/31/2008     1. Breakthrough bleeding associated with intrauterine device (IUD)   2. Morbid obesity ([place])   3. Cyst of ovary, unspecified laterality     The cyst is a large simple cyst, asymptomatic and very likely benign.  As ParaGard provides no cycle control she is having long crampy menses. This may be exaggerated by her ovarian cyst making her last few cycles 20 days in length.   Plan:            1.  Six-week ultrasound follow-up of ovarian cyst. Base management on findings.  2.  We have discussed multiple options for cycle control especially hormonal. She has declined OCPs but is very interested in the possibility of Mirena IUD both for birth control and cycle control. Risks and benefits discussed for Mirena versus ParaGard. IUD Literature on [place] given.  Risks and benefits of each discussed.  She is considering IUD as an option for birth/cycle control.   No orders of the defined types were placed in this encounter.       F/U  Return for She is to call at the start of next menses.  Finis Bud, M.D. 03/18/2017 12:04 PM

## 2017-04-08 ENCOUNTER — Encounter
Admission: RE | Admit: 2017-04-08 | Discharge: 2017-04-08 | Disposition: A | Discharge status: Home / Self Care | Payer: Managed Care, Other (non HMO) | Source: Ambulatory Visit | Attending: Otolaryngology | Admitting: Otolaryngology

## 2017-04-08 ENCOUNTER — Ambulatory Visit: Admit: 2017-04-08 | Payer: Managed Care, Other (non HMO) | Admitting: Otolaryngology

## 2017-04-08 DIAGNOSIS — I10 Essential (primary) hypertension: Secondary | ICD-10-CM

## 2017-04-08 HISTORY — DX: Anemia, unspecified: D64.9

## 2017-04-08 HISTORY — DX: Gastro-esophageal reflux disease without esophagitis: K21.9

## 2017-04-08 HISTORY — DX: Family history of other specified conditions: Z84.89

## 2017-04-08 SURGERY — TONSILLECTOMY
Anesthesia: General

## 2017-04-08 NOTE — Patient Instructions (Signed)
  Your procedure is scheduled on: 04-09-17 Report to Same Day Surgery 2nd floor medical mall [facility] Entrance-take elevator on left to 2nd floor.  Check in with surgery information desk.) To find out your arrival time please call [phone] between 1PM - 3PM on   Remember: Instructions that are not followed completely may result in serious medical risk, up to and including death, or upon the discretion of your surgeon and anesthesiologist your surgery may need to be rescheduled.    _x___ 1. Do not eat food or drink liquids after midnight. No gum chewing or hard candies.     __x__ 2. No Alcohol for 24 hours before or after surgery.   __x__3. No Smoking for 24 prior to surgery.   ____  4. Bring all medications with you on the day of surgery if instructed.    __x__ 5. Notify your doctor if there is any change in your medical condition     (cold, fever, infections).     Do not wear jewelry, make-up, hairpins, clips or nail polish.  Do not wear lotions, powders, or perfumes. You may wear deodorant.  Do not shave 48 hours prior to surgery. Men may shave face and neck.  Do not bring valuables to the hospital.    [facility] is not responsible for any belongings or valuables.               Contacts, dentures or bridgework may not be worn into surgery.  Leave your suitcase in the car. After surgery it may be brought to your room.  For patients admitted to the hospital, discharge time is determined by your  treatment team.   Patients discharged the day of surgery will not be allowed to drive home.  You will need someone to drive you home and stay with you the night of your procedure.    Please read over the following fact sheets that you were given:    _x___ Take anti-hypertensive (unless it includes a diuretic), cardiac, seizure, asthma,     anti-reflux and psychiatric medicines. These include:  1. LOSARTAN  2. ZANTAC  3.  4.  5.  6.  ____Fleets enema or Magnesium Citrate as  directed.   ____ Use CHG Soap or sage wipes as directed on instruction sheet   ____ Use inhalers on the day of surgery and bring to hospital day of surgery  ____ Stop Metformin and Janumet 2 days prior to surgery.    ____ Take 1/2 of usual insulin dose the night before surgery and none on the morning     surgery.   ____ Follow recommendations from Cardiologist, Pulmonologist or PCP regarding  stopping Aspirin, Coumadin, Pllavix ,Eliquis, Effient, or Pradaxa, and Pletal.  ____Stop Anti-inflammatories such as Advil, Aleve, Ibuprofen, Motrin, Naproxen, Naprosyn, Goodies powders or aspirin products. OK to take Tylenol    ____ Stop supplements until after surgery.     ____ Bring C-Pap to the hospital.

## 2017-04-09 ENCOUNTER — Encounter: Payer: Self-pay | Admitting: *Deleted

## 2017-04-09 ENCOUNTER — Ambulatory Visit
Admission: RE | Admit: 2017-04-09 | Discharge: 2017-04-09 | Disposition: A | Discharge status: Home / Self Care | Payer: Managed Care, Other (non HMO) | Source: Ambulatory Visit | Attending: Otolaryngology | Admitting: Otolaryngology

## 2017-04-09 ENCOUNTER — Encounter
Admission: RE | Disposition: A | Discharge status: Home / Self Care | Payer: Self-pay | Source: Ambulatory Visit | Attending: Otolaryngology

## 2017-04-09 ENCOUNTER — Ambulatory Visit: Payer: Managed Care, Other (non HMO) | Admitting: Anesthesiology

## 2017-04-09 DIAGNOSIS — F419 Anxiety disorder, unspecified: Secondary | ICD-10-CM | POA: Insufficient documentation

## 2017-04-09 DIAGNOSIS — I10 Essential (primary) hypertension: Secondary | ICD-10-CM | POA: Diagnosis not present

## 2017-04-09 DIAGNOSIS — Z87891 Personal history of nicotine dependence: Secondary | ICD-10-CM | POA: Insufficient documentation

## 2017-04-09 DIAGNOSIS — J3501 Chronic tonsillitis: Secondary | ICD-10-CM | POA: Insufficient documentation

## 2017-04-09 DIAGNOSIS — Z6841 Body Mass Index (BMI) 40.0 and over, adult: Secondary | ICD-10-CM | POA: Insufficient documentation

## 2017-04-09 DIAGNOSIS — Z79899 Other long term (current) drug therapy: Secondary | ICD-10-CM | POA: Insufficient documentation

## 2017-04-09 DIAGNOSIS — E785 Hyperlipidemia, unspecified: Secondary | ICD-10-CM | POA: Diagnosis not present

## 2017-04-09 DIAGNOSIS — E7800 Pure hypercholesterolemia, unspecified: Secondary | ICD-10-CM | POA: Diagnosis not present

## 2017-04-09 DIAGNOSIS — F329 Major depressive disorder, single episode, unspecified: Secondary | ICD-10-CM | POA: Insufficient documentation

## 2017-04-09 HISTORY — PX: TONSILLECTOMY: SHX5217

## 2017-04-09 LAB — POCT I-STAT 4, (NA,K, GLUC, HGB,HCT)
Glucose, Bld: 126 mg/dL — ABNORMAL HIGH (ref 65–99)
HEMATOCRIT: 33 % — AB (ref 36.0–46.0)
HEMOGLOBIN: 11.2 g/dL — AB (ref 12.0–15.0)
Potassium: 3.8 mmol/L (ref 3.5–5.1)
Sodium: 138 mmol/L (ref 135–145)

## 2017-04-09 LAB — POCT PREGNANCY, URINE: Preg Test, Ur: NEGATIVE

## 2017-04-09 SURGERY — TONSILLECTOMY
Anesthesia: General | Wound class: Clean Contaminated

## 2017-04-09 MED ORDER — HYDROCODONE-ACETAMINOPHEN 7.5-325 MG/15ML PO SOLN
ORAL | Status: AC
  Administered 2017-04-09: 15 mL
  Filled 2017-04-09: qty 15

## 2017-04-09 MED ORDER — SUGAMMADEX SODIUM 500 MG/5ML IV SOLN
INTRAVENOUS | Status: AC
  Filled 2017-04-09: qty 5

## 2017-04-09 MED ORDER — PREDNISOLONE SODIUM PHOSPHATE 15 MG/5ML PO SOLN: ORAL | 0 refills | Status: DC

## 2017-04-09 MED ORDER — BUPIVACAINE-EPINEPHRINE (PF) 0.25% -1:200000 IJ SOLN
INTRAMUSCULAR | Status: AC
  Filled 2017-04-09: qty 30

## 2017-04-09 MED ORDER — ACETAMINOPHEN 10 MG/ML IV SOLN
INTRAVENOUS | Status: AC
  Filled 2017-04-09: qty 100

## 2017-04-09 MED ORDER — PHENYLEPHRINE HCL 10 MG/ML IJ SOLN
INTRAMUSCULAR | Status: DC | PRN
  Administered 2017-04-09: 100 ug via INTRAVENOUS

## 2017-04-09 MED ORDER — GLYCOPYRROLATE 0.2 MG/ML IJ SOLN
INTRAMUSCULAR | Status: DC | PRN
  Administered 2017-04-09: 0.2 mg via INTRAVENOUS

## 2017-04-09 MED ORDER — ONDANSETRON HCL 4 MG/2ML IJ SOLN
INTRAMUSCULAR | Status: DC | PRN
  Administered 2017-04-09: 4 mg via INTRAVENOUS

## 2017-04-09 MED ORDER — ONDANSETRON HCL 4 MG/2ML IJ SOLN
INTRAMUSCULAR | Status: AC
  Filled 2017-04-09: qty 2

## 2017-04-09 MED ORDER — SUCCINYLCHOLINE CHLORIDE 20 MG/ML IJ SOLN
INTRAMUSCULAR | Status: DC | PRN
  Administered 2017-04-09: 100 mg via INTRAVENOUS

## 2017-04-09 MED ORDER — FENTANYL CITRATE (PF) 100 MCG/2ML IJ SOLN
25.0000 ug | INTRAMUSCULAR | Status: DC | PRN
  Administered 2017-04-09 (×4): 25 ug via INTRAVENOUS

## 2017-04-09 MED ORDER — DEXAMETHASONE SODIUM PHOSPHATE 10 MG/ML IJ SOLN
INTRAMUSCULAR | Status: AC
  Filled 2017-04-09: qty 1

## 2017-04-09 MED ORDER — FENTANYL CITRATE (PF) 100 MCG/2ML IJ SOLN
INTRAMUSCULAR | Status: DC | PRN
Start: 2017-04-09 — End: 2017-04-09
  Administered 2017-04-09 (×3): 50 ug via INTRAVENOUS

## 2017-04-09 MED ORDER — FENTANYL CITRATE (PF) 100 MCG/2ML IJ SOLN
INTRAMUSCULAR | Status: AC
  Filled 2017-04-09: qty 2

## 2017-04-09 MED ORDER — MIDAZOLAM HCL 2 MG/2ML IJ SOLN
INTRAMUSCULAR | Status: AC
  Filled 2017-04-09: qty 2

## 2017-04-09 MED ORDER — ONDANSETRON HCL 4 MG/2ML IJ SOLN: 4.0000 mg | Freq: Once | INTRAMUSCULAR | Status: DC | PRN

## 2017-04-09 MED ORDER — LIDOCAINE HCL 2 % EX GEL
CUTANEOUS | Status: AC
  Filled 2017-04-09: qty 5

## 2017-04-09 MED ORDER — EPHEDRINE SULFATE 50 MG/ML IJ SOLN
INTRAMUSCULAR | Status: DC | PRN
  Administered 2017-04-09 (×2): 10 mg via INTRAVENOUS

## 2017-04-09 MED ORDER — DEXMEDETOMIDINE HCL IN NACL 200 MCG/50ML IV SOLN
INTRAVENOUS | Status: DC | PRN
  Administered 2017-04-09 (×2): 20 ug via INTRAVENOUS

## 2017-04-09 MED ORDER — ACETAMINOPHEN 10 MG/ML IV SOLN
INTRAVENOUS | Status: DC | PRN
  Administered 2017-04-09: 1000 mg via INTRAVENOUS

## 2017-04-09 MED ORDER — OXYMETAZOLINE HCL 0.05 % NA SOLN
NASAL | Status: AC
  Filled 2017-04-09: qty 15

## 2017-04-09 MED ORDER — OXYMETAZOLINE HCL 0.05 % NA SOLN
NASAL | Status: DC | PRN
  Administered 2017-04-09: 1

## 2017-04-09 MED ORDER — GLYCOPYRROLATE 0.2 MG/ML IJ SOLN
INTRAMUSCULAR | Status: AC
  Filled 2017-04-09: qty 1

## 2017-04-09 MED ORDER — SUGAMMADEX SODIUM 200 MG/2ML IV SOLN
INTRAVENOUS | Status: DC | PRN
  Administered 2017-04-09: 240 mg via INTRAVENOUS

## 2017-04-09 MED ORDER — LACTATED RINGERS IV SOLN
INTRAVENOUS | Status: DC
  Administered 2017-04-09: 07:00:00 via INTRAVENOUS

## 2017-04-09 MED ORDER — SUCCINYLCHOLINE CHLORIDE 20 MG/ML IJ SOLN
INTRAMUSCULAR | Status: AC
  Filled 2017-04-09: qty 1

## 2017-04-09 MED ORDER — FENTANYL CITRATE (PF) 100 MCG/2ML IJ SOLN
INTRAMUSCULAR | Status: AC
  Administered 2017-04-09: 25 ug via INTRAVENOUS
  Filled 2017-04-09: qty 2

## 2017-04-09 MED ORDER — BUPIVACAINE-EPINEPHRINE 0.25% -1:200000 IJ SOLN
INTRAMUSCULAR | Status: DC | PRN
  Administered 2017-04-09: 3 mL

## 2017-04-09 MED ORDER — ROCURONIUM BROMIDE 50 MG/5ML IV SOLN
INTRAVENOUS | Status: AC
  Filled 2017-04-09: qty 1

## 2017-04-09 MED ORDER — DEXMEDETOMIDINE HCL IN NACL 200 MCG/50ML IV SOLN
INTRAVENOUS | Status: AC
  Filled 2017-04-09: qty 50

## 2017-04-09 MED ORDER — HYDROCODONE-ACETAMINOPHEN 7.5-325 MG/15ML PO SOLN: ORAL | 0 refills | Status: DC

## 2017-04-09 MED ORDER — PROPOFOL 10 MG/ML IV BOLUS
INTRAVENOUS | Status: AC
  Filled 2017-04-09: qty 20

## 2017-04-09 MED ORDER — AMOXICILLIN 400 MG/5ML PO SUSR: ORAL | 0 refills | Status: DC

## 2017-04-09 MED ORDER — LIDOCAINE HCL (CARDIAC) 20 MG/ML IV SOLN
INTRAVENOUS | Status: DC | PRN
  Administered 2017-04-09: 50 mg via INTRAVENOUS

## 2017-04-09 MED ORDER — DEXAMETHASONE SODIUM PHOSPHATE 10 MG/ML IJ SOLN
INTRAMUSCULAR | Status: DC | PRN
  Administered 2017-04-09: 10 mg via INTRAVENOUS

## 2017-04-09 MED ORDER — MIDAZOLAM HCL 2 MG/2ML IJ SOLN
INTRAMUSCULAR | Status: DC | PRN
  Administered 2017-04-09: 2 mg via INTRAVENOUS

## 2017-04-09 MED ORDER — ROCURONIUM BROMIDE 100 MG/10ML IV SOLN
INTRAVENOUS | Status: DC | PRN
  Administered 2017-04-09: 25 mg via INTRAVENOUS

## 2017-04-09 MED ORDER — PROPOFOL 10 MG/ML IV BOLUS
INTRAVENOUS | Status: DC | PRN
  Administered 2017-04-09: 200 mg via INTRAVENOUS

## 2017-04-09 SURGICAL SUPPLY — 16 items
CANISTER SUCT 1200ML W/VALVE (MISCELLANEOUS) ×2 IMPLANT
CATH ROBINSON RED A/P 10FR (CATHETERS) ×2 IMPLANT
COAG SUCT 10F 3.5MM HAND CTRL (MISCELLANEOUS) ×2 IMPLANT
ELECT REM PT RETURN 9FT ADLT (ELECTROSURGICAL) ×2
ELECTRODE REM PT RTRN 9FT ADLT (ELECTROSURGICAL) ×1 IMPLANT
GLOVE BIO SURGEON STRL SZ7.5 (GLOVE) ×4 IMPLANT
GOWN STRL REUS W/ TWL LRG LVL3 (GOWN DISPOSABLE) ×2 IMPLANT
GOWN STRL REUS W/TWL LRG LVL3 (GOWN DISPOSABLE) ×2
KIT RM TURNOVER STRD PROC AR (KITS) ×2 IMPLANT
LABEL OR SOLS (LABEL) ×2 IMPLANT
NS IRRIG 500ML POUR BTL (IV SOLUTION) ×2 IMPLANT
PACK HEAD/NECK (MISCELLANEOUS) ×2 IMPLANT
PENCIL ELECTRO HAND CTR (MISCELLANEOUS) ×2 IMPLANT
SOL ANTI-FOG 6CC FOG-OUT (MISCELLANEOUS) ×1 IMPLANT
SOL FOG-OUT ANTI-FOG 6CC (MISCELLANEOUS) ×1
SPONGE TONSIL 1 RF SGL (DISPOSABLE) ×2 IMPLANT

## 2017-04-09 NOTE — Anesthesia Preprocedure Evaluation (Signed)
Anesthesia Evaluation  Patient identified by MRN, date of birth, ID band Patient awake    Reviewed: Allergy & Precautions, NPO status , Patient's Chart, lab work & pertinent test results, reviewed documented beta blocker date and time   History of Anesthesia Complications (+) Family history of anesthesia reaction  Airway Mallampati: III  TM Distance: >3 FB     Dental  (+) Chipped   Pulmonary former smoker,           Cardiovascular hypertension, Pt. on medications      Neuro/Psych PSYCHIATRIC DISORDERS Depression  Neuromuscular disease    GI/Hepatic GERD  Controlled,  Endo/Other  Morbid obesity  Renal/GU      Musculoskeletal   Abdominal   Peds  Hematology  (+) anemia ,   Anesthesia Other Findings She has had PVCs for a long time.  Reproductive/Obstetrics                             Anesthesia Physical Anesthesia Plan  ASA: III  Anesthesia Plan: General   Post-op Pain Management:    Induction: Intravenous  Airway Management Planned: Oral ETT  Additional Equipment:   Intra-op Plan:   Post-operative Plan:   Informed Consent: I have reviewed the patients History and Physical, chart, labs and discussed the procedure including the risks, benefits and alternatives for the proposed anesthesia with the patient or authorized representative who has indicated his/her understanding and acceptance.     Plan Discussed with: CRNA  Anesthesia Plan Comments:         Anesthesia Quick Evaluation

## 2017-04-09 NOTE — Progress Notes (Signed)
No bleeding

## 2017-04-09 NOTE — Progress Notes (Signed)
Ice pack to neck. 

## 2017-04-09 NOTE — Transfer of Care (Signed)
Immediate Anesthesia Transfer of Care Note  Patient: Andrea Colon  Procedure(s) Performed: Procedure(s): TONSILLECTOMY (N/A)  Patient Location: PACU  Anesthesia Type:General  Level of Consciousness: awake and alert   Airway & Oxygen Therapy: Patient Spontanous Breathing and Patient connected to face mask oxygen  Post-op Assessment: Report given to RN and Post -op Vital signs reviewed and stable  Post vital signs: Reviewed  Last Vitals:  Vitals:   04/09/17 0620 04/09/17 0851  BP: 123/85 126/74  Pulse: 92 99  Resp: 14 16  Temp: 36.4 C 36.2 C    Last Pain:  Vitals:   04/09/17 0620  TempSrc: Oral  PainSc: 0-No pain         Complications: No apparent anesthesia complications

## 2017-04-09 NOTE — Anesthesia Post-op Follow-up Note (Cosign Needed)
Anesthesia QCDR form completed.        

## 2017-04-09 NOTE — Progress Notes (Signed)
Doing well   No bleeding noted   Taking ice chips after pain med administered

## 2017-04-09 NOTE — Op Note (Signed)
Addendum to op note: Called by anesthesia prior to extubation (op note had already been finalized) as they had noted blood in the throat when suctioning. Anesthesia deepened to allow better inspection and carefully inspected the oral cavity. No major source of bleeding was found, though I did cauterize the tonsillar fossa bilaterally in areas where some minor capillary oozing was noted. Stomach suctioned to removed any blood and also carefully inspected the tongue and nasopharynx to make sure there were no other sources of bleeding. She was then returned to the anesthesiologist for extubation as noted in the original op note.

## 2017-04-09 NOTE — H&P (Signed)
History and physical reviewed and will be scanned in later. No change in medical status reported by the patient or family, appears stable for surgery. All questions regarding the procedure answered, and patient (or family if a child) expressed understanding of the procedure.  Andrea Colon S @TODAY@ 

## 2017-04-09 NOTE — Op Note (Signed)
04/09/2017  8:06 AM    [name]  [id number]   Pre-Op Diagnosis:  chronic tonsillitis,tonsillar hypertrophy  Post-op Diagnosis: chronic tonsillitis, tonsillar hypertrophy  Procedure: Tonsillectomy  Surgeon:  Riley Nearing., MD  Anesthesia:  General endotracheal  EBL:  Less than 25 cc  Complications:  None  Findings: 3+ cryptic tonsils  Procedure: The patient was taken to the Operating Room and placed in the supine position.  After induction of general endotracheal anesthesia, the table was turned 90 degrees and the patient was draped in the usual fashion  with the eyes protected.  A mouth gag was inserted into the oral cavity to open the mouth, and examination of the oropharynx showed the uvula was non-bifid. The palate was palpated, and there was no evidence of submucous cleft. Examination of the nasopharynx showed no obstructing adenoids. The right tonsil was grasped with an Allis clamp and resected from the tonsillar fossa in the usual fashion with the Bovie. The left tonsil was resected in the same fashion. The Bovie was used to obtain hemostasis. Each tonsillar fossa was then carefully injected with 0.25% marcaine with epinephrine, 1:200,000, avoiding intravascular injection. The nose and throat were irrigated and suctioned to remove any  blood clot. The mouth gag was  removed with no evidence of active bleeding.  The patient was then returned to the anesthesiologist for awakening, and was taken to the Recovery Room in stable condition.  Cultures:  None.  Specimens:  Tonsils.  Disposition:   PACU to home  Plan: Soft, bland diet and push fluids. Take pain medications and antibiotics as prescribed. No strenuous activity for 2 weeks. Follow-up in 3 weeks.  Riley Nearing 04/09/2017 8:06 AM

## 2017-04-09 NOTE — Anesthesia Postprocedure Evaluation (Signed)
Anesthesia Post Note  Patient: BABY GIEGER  Procedure(s) Performed: Procedure(s) (LRB): TONSILLECTOMY (N/A)  Patient location during evaluation: PACU Anesthesia Type: General Level of consciousness: awake and alert Pain management: pain level controlled Vital Signs Assessment: post-procedure vital signs reviewed and stable Respiratory status: spontaneous breathing, nonlabored ventilation, respiratory function stable and patient connected to nasal cannula oxygen Cardiovascular status: blood pressure returned to baseline and stable Postop Assessment: no signs of nausea or vomiting Anesthetic complications: no     Last Vitals:  Vitals:   04/09/17 0955 04/09/17 1000  BP: 119/66 (!) 128/57  Pulse: 72 89  Resp: 16   Temp: (!) 35.9 C     Last Pain:  Vitals:   04/09/17 1000  TempSrc:   PainSc: 2                  Melinna Linarez S

## 2017-04-09 NOTE — Discharge Instructions (Signed)
AMBULATORY SURGERY  °DISCHARGE INSTRUCTIONS ° ° °1) The drugs that you were given will stay in your system until tomorrow so for the next 24 hours you should not: ° °A) Drive an automobile °B) Make any legal decisions °C) Drink any alcoholic beverage ° ° °2) You may resume regular meals tomorrow.  Today it is better to start with liquids and gradually work up to solid foods. ° °You may eat anything you prefer, but it is better to start with liquids, then soup and crackers, and gradually work up to solid foods. ° ° °3) Please notify your doctor immediately if you have any unusual bleeding, trouble breathing, redness and pain at the surgery site, drainage, fever, or pain not relieved by medication. ° ° ° °4) Additional Instructions: ° ° ° ° ° ° ° °Please contact your physician with any problems or Same Day Surgery at [phone], Monday through Friday 6 am to 4 pm, or [place] [facility] [place] Main number at [phone].AMBULATORY SURGERY  °DISCHARGE INSTRUCTIONS ° ° °5) The drugs that you were given will stay in your system until tomorrow so for the next 24 hours you should not: ° °D) Drive an automobile °E) Make any legal decisions °F) Drink any alcoholic beverage ° ° °6) You may resume regular meals tomorrow.  Today it is better to start with liquids and gradually work up to solid foods. ° °You may eat anything you prefer, but it is better to start with liquids, then soup and crackers, and gradually work up to solid foods. ° ° °7) Please notify your doctor immediately if you have any unusual bleeding, trouble breathing, redness and pain at the surgery site, drainage, fever, or pain not relieved by medication. ° ° ° °8) Additional Instructions: ° ° ° ° ° ° ° °Please contact your physician with any problems or Same Day Surgery at [phone], Monday through Friday 6 am to 4 pm, or [place] [facility] [place] Main number at [phone].AMBULATORY SURGERY  °DISCHARGE INSTRUCTIONS ° ° °9) The drugs that you were given  will stay in your system until tomorrow so for the next 24 hours you should not: ° °G) Drive an automobile °H) Make any legal decisions °I) Drink any alcoholic beverage ° ° °10) You may resume regular meals tomorrow.  Today it is better to start with liquids and gradually work up to solid foods. ° °You may eat anything you prefer, but it is better to start with liquids, then soup and crackers, and gradually work up to solid foods. ° ° °11) Please notify your doctor immediately if you have any unusual bleeding, trouble breathing, redness and pain at the surgery site, drainage, fever, or pain not relieved by medication. ° ° ° °12) Additional Instructions: ° ° ° ° ° ° ° °Please contact your physician with any problems or Same Day Surgery at [phone], Monday through Friday 6 am to 4 pm, or [place] at  Main number at [phone]. °

## 2017-04-09 NOTE — Anesthesia Procedure Notes (Signed)
Procedure Name: Intubation Performed by: Rolla Plate Pre-anesthesia Checklist: Patient identified, Patient being monitored, Timeout performed, Emergency Drugs available and Suction available Patient Re-evaluated:Patient Re-evaluated prior to inductionOxygen Delivery Method: Circle system utilized Preoxygenation: Pre-oxygenation with 100% oxygen Intubation Type: IV induction, Rapid sequence and Cricoid Pressure applied Ventilation: Mask ventilation without difficulty Laryngoscope Size: Miller and 2 Grade View: Grade I Tube type: Oral Rae Tube size: 7.0 mm Number of attempts: 1 Airway Equipment and Method: Stylet Placement Confirmation: ETT inserted through vocal cords under direct vision,  positive ETCO2 and breath sounds checked- equal and bilateral Secured at: 21 cm Tube secured with: Tape Dental Injury: Teeth and Oropharynx as per pre-operative assessment

## 2017-04-10 LAB — SURGICAL PATHOLOGY

## 2017-04-29 ENCOUNTER — Other Ambulatory Visit: Payer: Managed Care, Other (non HMO)

## 2017-06-21 ENCOUNTER — Other Ambulatory Visit: Payer: Self-pay | Admitting: Family Medicine

## 2017-07-30 ENCOUNTER — Other Ambulatory Visit: Payer: Self-pay | Admitting: Family Medicine

## 2017-07-30 NOTE — Telephone Encounter (Deleted)
Last office visit with Dr. Diona Browner 11/22/2016.

## 2017-08-27 ENCOUNTER — Other Ambulatory Visit: Payer: Self-pay | Admitting: Family Medicine

## 2017-09-28 ENCOUNTER — Other Ambulatory Visit: Payer: Self-pay | Admitting: Family Medicine

## 2017-09-30 ENCOUNTER — Other Ambulatory Visit: Payer: Self-pay | Admitting: Family Medicine

## 2017-09-30 ENCOUNTER — Ambulatory Visit (INDEPENDENT_AMBULATORY_CARE_PROVIDER_SITE_OTHER): Payer: Managed Care, Other (non HMO) | Admitting: Family Medicine

## 2017-09-30 ENCOUNTER — Encounter: Payer: Self-pay | Admitting: Family Medicine

## 2017-09-30 VITALS — BP 112/76 | HR 82 | Temp 98.2°F | Ht 64.0 in | Wt 256.5 lb

## 2017-09-30 DIAGNOSIS — F411 Generalized anxiety disorder: Secondary | ICD-10-CM | POA: Diagnosis not present

## 2017-09-30 DIAGNOSIS — F330 Major depressive disorder, recurrent, mild: Secondary | ICD-10-CM | POA: Diagnosis not present

## 2017-09-30 MED ORDER — DULOXETINE HCL 30 MG PO CPEP: 30.0000 mg | ORAL_CAPSULE | Freq: Every day | ORAL | 3 refills | Status: DC

## 2017-09-30 NOTE — Patient Instructions (Addendum)
Stop sertraline and change to cymbalta 30 mg daily.  Can increase to 60 mg daily if  Mood  Same or worse  in 1-2 week.  Keep follow up in 1 months.  Call if interested in psychiatry referral.

## 2017-09-30 NOTE — Assessment & Plan Note (Signed)
INadeqaute control vs SE to SSRI.. change to Cymbalta.. May need to titrate up. Follow up in 1 month re-eval.

## 2017-09-30 NOTE — Progress Notes (Signed)
   Subjective:    Patient ID: Andrea Colon, female    DOB: 05/13/1976, 41 y.o.   MRN: [id number]  HPI    41 year old female presents for follow up depression, moderate recurrent and generalized anxiety.   She  is currently on sertraline 75 mg daily   PHQ9: 17  GAD7: 8   Wt Readings from Last 3 Encounters:  09/30/17 256 lb 8 oz (116.3 kg)  04/08/17 264 lb (119.7 kg)  03/18/17 266 lb 9 oz (120.9 kg)     She reports today that sertraline is not helpful any longer (has been on for 2 years). She would like to wean off this medication and try another. She reports mood ins worsening in last several months.  She is tired , unmotivated.. Just does not care about this.  No crying, no weapy. She has been more anxious in last few weeks.  Trouble falling asleep.. Tossing and turing.. Can't quit thinking. In last few weeks.   He mother passed away on 2023-09-29..from non-small cell neuroendocrine tumor. She is very emotionless about this.   SE to wellbutrin.. Palpitations?   Review of Systems  Constitutional: Negative for fatigue and fever.  HENT: Negative for ear pain.   Eyes: Negative for pain.  Respiratory: Negative for chest tightness and shortness of breath.   Cardiovascular: Negative for chest pain, palpitations and leg swelling.  Gastrointestinal: Negative for abdominal pain.  Genitourinary: Negative for dysuria.  Psychiatric/Behavioral: Positive for agitation, decreased concentration, dysphoric mood and sleep disturbance. Negative for self-injury and suicidal ideas. The patient is nervous/anxious.        Objective:   Physical Exam  Constitutional: Vital signs are normal. She appears well-developed and well-nourished. She is cooperative.  Non-toxic appearance. She does not appear ill. No distress.  HENT:  Head: Normocephalic.  Right Ear: Hearing, tympanic membrane, external ear and ear canal normal. Tympanic membrane is not erythematous, not retracted and not bulging.  Left  Ear: Hearing, tympanic membrane, external ear and ear canal normal. Tympanic membrane is not erythematous, not retracted and not bulging.  Nose: No mucosal edema or rhinorrhea. Right sinus exhibits no maxillary sinus tenderness and no frontal sinus tenderness. Left sinus exhibits no maxillary sinus tenderness and no frontal sinus tenderness.  Mouth/Throat: Uvula is midline, oropharynx is clear and moist and mucous membranes are normal.  Eyes: Pupils are equal, round, and reactive to light. Conjunctivae, EOM and lids are normal. Lids are everted and swept, no foreign bodies found.  Neck: Trachea normal and normal range of motion. Neck supple. Carotid bruit is not present. No thyroid mass and no thyromegaly present.  Cardiovascular: Normal rate, regular rhythm, S1 normal, S2 normal, normal heart sounds, intact distal pulses and normal pulses.  Exam reveals no gallop and no friction rub.   No murmur heard. Pulmonary/Chest: Effort normal and breath sounds normal. No tachypnea. No respiratory distress. She has no decreased breath sounds. She has no wheezes. She has no rhonchi. She has no rales.  Abdominal: Soft. Normal appearance and bowel sounds are normal. There is no tenderness.  Neurological: She is alert.  Skin: Skin is warm, dry and intact. No rash noted.  Psychiatric: Her speech is normal and behavior is normal. Judgment and thought content normal. Her mood appears not anxious. Cognition and memory are normal. She does not exhibit a depressed mood.          Assessment & Plan:

## 2017-11-07 ENCOUNTER — Other Ambulatory Visit: Payer: Self-pay

## 2017-11-07 ENCOUNTER — Ambulatory Visit (INDEPENDENT_AMBULATORY_CARE_PROVIDER_SITE_OTHER): Payer: Managed Care, Other (non HMO) | Admitting: Family Medicine

## 2017-11-07 ENCOUNTER — Encounter: Payer: Self-pay | Admitting: Family Medicine

## 2017-11-07 VITALS — BP 100/60 | HR 98 | Temp 99.1°F | Ht 64.25 in | Wt 259.2 lb

## 2017-11-07 DIAGNOSIS — Z0000 Encounter for general adult medical examination without abnormal findings: Secondary | ICD-10-CM

## 2017-11-07 DIAGNOSIS — F411 Generalized anxiety disorder: Secondary | ICD-10-CM

## 2017-11-07 DIAGNOSIS — F330 Major depressive disorder, recurrent, mild: Secondary | ICD-10-CM | POA: Diagnosis not present

## 2017-11-07 DIAGNOSIS — E781 Pure hyperglyceridemia: Secondary | ICD-10-CM | POA: Diagnosis not present

## 2017-11-07 DIAGNOSIS — F325 Major depressive disorder, single episode, in full remission: Secondary | ICD-10-CM

## 2017-11-07 DIAGNOSIS — R7303 Prediabetes: Secondary | ICD-10-CM | POA: Diagnosis not present

## 2017-11-07 DIAGNOSIS — I10 Essential (primary) hypertension: Secondary | ICD-10-CM

## 2017-11-07 MED ORDER — DULOXETINE HCL 60 MG PO CPEP: 60.0000 mg | ORAL_CAPSULE | Freq: Every day | ORAL | 5 refills | Status: DC

## 2017-11-07 NOTE — Assessment & Plan Note (Signed)
Poor control... Increase medication.

## 2017-11-07 NOTE — Assessment & Plan Note (Signed)
Well controlled. Continue current medication.  

## 2017-11-07 NOTE — Assessment & Plan Note (Deleted)
Improved control but inadequate.. Increase to cymbalta 60 mg daily. Try taking during the day to avoid any disruption of sleep.

## 2017-11-07 NOTE — Assessment & Plan Note (Signed)
Worsening control with weight gain. Work on low carb diet and increase exercise. recehck in 6 months.

## 2017-11-07 NOTE — Assessment & Plan Note (Signed)
Encouraged exercise, weight loss, healthy eating habits. She has already started lifestyle changes.

## 2017-11-07 NOTE — Progress Notes (Signed)
Subjective:    Patient ID: Andrea Colon, female    DOB: 11-Apr-1976, 41 y.o.   MRN: [id number]  HPI   The patient is here for annual wellness exam and preventative care.      ON 10/18 She was  Changed to  Duloxetine from sertraline for poorly controlled depression, GAD  No new side effects except she has been having more restless leg.  She does feel less , thinking more clearly.  Still poor control of anxiety.. Less mopey.  GAD 7 : Generalized Anxiety Score 09/30/2017  Nervous, Anxious, on Edge 2  Control/stop worrying 0  Worry too much - different things 0  Trouble relaxing 2  Restless 2  Easily annoyed or irritable 2  Afraid - awful might happen 0  Total GAD 7 Score 8  Anxiety Difficulty Somewhat difficult     Depression screen [facility] 2/9 09/30/2017 04/04/2015  Decreased Interest 2 3  Down, Depressed, Hopeless 2 3  PHQ - 2 Score 4 6  Altered sleeping 2 3  Tired, decreased energy 3 1  Change in appetite 2 2  Feeling bad or failure about yourself  2 3  Trouble concentrating 2 3  Moving slowly or fidgety/restless 2 1  Suicidal thoughts 0 2  PHQ-9 Score 17 21  Difficult doing work/chores - Somewhat difficult    prediabetes  Labs done at work.. Review in detail.Marland Kitchen  this year a1C 6.4 Lab Results  Component Value Date   HGBA1C 6.1 04/09/2016   Labs at work: trig 197, LDL 50s, HDL 20s.  Exercise: trying to increase.  Diet: She is doing keto diet.  Body mass index is 44.15 kg/m.  Wt Readings from Last 3 Encounters:  11/07/17 259 lb 4 oz (117.6 kg)  09/30/17 256 lb 8 oz (116.3 kg)  04/08/17 264 lb (119.7 kg)   Social History /Family History/Past Medical History reviewed in detail and updated in EMR if needed.  Blood pressure 100/60, pulse 98, temperature 99.1 F (37.3 C), temperature source Oral, height 5' 4.25" (1.632 m), weight 259 lb 4 oz (117.6 kg), last menstrual period 10/06/2017. Body mass index is 44.15 kg/m.   Review of Systems  Constitutional:  Negative for fatigue and fever.  HENT: Negative for ear pain.   Eyes: Negative for pain.  Respiratory: Negative for chest tightness and shortness of breath.   Cardiovascular: Negative for chest pain, palpitations and leg swelling.  Gastrointestinal: Negative for abdominal pain.  Genitourinary: Negative for dysuria.       Objective:   Physical Exam  Constitutional: Vital signs are normal. She appears well-developed and well-nourished. She is cooperative.  Non-toxic appearance. She does not appear ill. No distress.  Morbidly obese  HENT:  Head: Normocephalic.  Right Ear: Hearing, tympanic membrane, external ear and ear canal normal.  Left Ear: Hearing, tympanic membrane, external ear and ear canal normal.  Nose: Nose normal.  Eyes: Conjunctivae, EOM and lids are normal. Pupils are equal, round, and reactive to light. Lids are everted and swept, no foreign bodies found.  Neck: Trachea normal and normal range of motion. Neck supple. Carotid bruit is not present. No thyroid mass and no thyromegaly present.  Cardiovascular: Normal rate, regular rhythm, S1 normal, S2 normal, normal heart sounds and intact distal pulses. Exam reveals no gallop.  No murmur heard. Pulmonary/Chest: Effort normal and breath sounds normal. No respiratory distress. She has no wheezes. She has no rhonchi. She has no rales.  Abdominal: Soft. Normal appearance and bowel sounds  are normal. She exhibits no distension, no fluid wave, no abdominal bruit and no mass. There is no hepatosplenomegaly. There is no tenderness. There is no rebound, no guarding and no CVA tenderness. No hernia.  Lymphadenopathy:    She has no cervical adenopathy.    She has no axillary adenopathy.  Neurological: She is alert. She has normal strength. No cranial nerve deficit or sensory deficit.  Skin: Skin is warm, dry and intact. No rash noted.  Psychiatric: Her speech is normal and behavior is normal. Judgment normal. Her mood appears not  anxious. Cognition and memory are normal. She does not exhibit a depressed mood.          Assessment & Plan:  The patient's preventative maintenance and recommended screening tests for an annual wellness exam were reviewed in full today. Brought up to date unless services declined.  Counselled on the importance of diet, exercise, and its role in overall health and mortality. The patient's FH and SH was reviewed, including their home life, tobacco status, and drug and alcohol status.   Vaccines: Last Tdap in , flu at work STD screen: refused Colon: no early family history. Mammogram: call to schedule PAP/DVE:  nml pap, HPV neg 03/2015, plan q5 year screen, Has IUD in place. Nml DVE with GYN 09/2015.  No ETOH, no smoking

## 2017-11-07 NOTE — Patient Instructions (Addendum)
Keep working on  weight loss, low carb diet, but try not to do very high fat diet.   Increase to 60 mg daily  Try to switch to daytime to avoid trouble sleeping.  Call to schedule mamogram on your own.

## 2017-11-07 NOTE — Assessment & Plan Note (Signed)
LDL at goal.. Counseled on how to improve HDl and trigs.

## 2017-11-07 NOTE — Assessment & Plan Note (Signed)
Improved control but inadequate.. Increase to cymbalta 60 mg daily. Try taking during the day to avoid any disruption of sleep.

## 2017-12-12 ENCOUNTER — Ambulatory Visit: Payer: Self-pay | Admitting: Family Medicine

## 2018-01-09 ENCOUNTER — Other Ambulatory Visit: Payer: Self-pay | Admitting: Family Medicine

## 2018-05-21 ENCOUNTER — Other Ambulatory Visit: Payer: Self-pay | Admitting: Family Medicine

## 2018-05-21 ENCOUNTER — Encounter: Payer: Self-pay | Admitting: Family Medicine

## 2018-05-21 MED ORDER — LOSARTAN POTASSIUM 100 MG PO TABS: 100.0000 mg | ORAL_TABLET | Freq: Every day | ORAL | 0 refills | Status: DC

## 2018-05-21 MED ORDER — DULOXETINE HCL 60 MG PO CPEP: 60.0000 mg | ORAL_CAPSULE | Freq: Every day | ORAL | 0 refills | Status: DC

## 2018-07-17 ENCOUNTER — Encounter: Payer: Self-pay | Admitting: Family Medicine

## 2018-07-17 MED ORDER — DULOXETINE HCL 30 MG PO CPEP: 30.0000 mg | ORAL_CAPSULE | Freq: Every day | ORAL | 1 refills | Status: DC

## 2018-07-21 MED ORDER — CLONAZEPAM 0.5 MG PO TABS: 0.5000 mg | ORAL_TABLET | Freq: Every day | ORAL | 0 refills | Status: DC

## 2018-07-21 NOTE — Addendum Note (Signed)
Addended by: Eliezer Lofts E on: 07/21/2018 11:35 AM   Modules accepted: Orders

## 2018-08-11 ENCOUNTER — Telehealth: Payer: Self-pay | Admitting: Family Medicine

## 2018-08-11 NOTE — Telephone Encounter (Signed)
Robin,  Please schedule CPE with fasting labs with Dr. Diona Browner in 3 months.  Thanks, Butch Penny

## 2018-08-11 NOTE — Telephone Encounter (Signed)
Last office visit 11/07/2017.  AVS stated to follow up mood in 4 weeks.  No future appointments.  Ok to refill?

## 2018-08-11 NOTE — Telephone Encounter (Signed)
Needs CPX in 3 months. Will refill until then.

## 2018-08-13 NOTE — Telephone Encounter (Signed)
Lab 11/21 cpx 11/26 Pt aware

## 2018-08-19 ENCOUNTER — Encounter: Payer: Self-pay | Admitting: Obstetrics and Gynecology

## 2018-08-21 ENCOUNTER — Ambulatory Visit: Payer: Commercial Indemnity | Admitting: Obstetrics and Gynecology

## 2018-09-02 ENCOUNTER — Encounter: Payer: Self-pay | Admitting: Obstetrics and Gynecology

## 2018-09-02 ENCOUNTER — Ambulatory Visit (INDEPENDENT_AMBULATORY_CARE_PROVIDER_SITE_OTHER): Payer: Managed Care, Other (non HMO) | Admitting: Obstetrics and Gynecology

## 2018-09-02 ENCOUNTER — Other Ambulatory Visit (HOSPITAL_COMMUNITY)
Admission: RE | Admit: 2018-09-02 | Discharge: 2018-09-02 | Disposition: A | Discharge status: Home / Self Care | Payer: Managed Care, Other (non HMO) | Source: Ambulatory Visit | Attending: Obstetrics and Gynecology | Admitting: Obstetrics and Gynecology

## 2018-09-02 VITALS — BP 124/70 | HR 91 | Ht 64.0 in | Wt 261.0 lb

## 2018-09-02 DIAGNOSIS — Z01419 Encounter for gynecological examination (general) (routine) without abnormal findings: Secondary | ICD-10-CM

## 2018-09-02 DIAGNOSIS — Z1231 Encounter for screening mammogram for malignant neoplasm of breast: Secondary | ICD-10-CM | POA: Diagnosis not present

## 2018-09-02 DIAGNOSIS — Z1239 Encounter for other screening for malignant neoplasm of breast: Secondary | ICD-10-CM

## 2018-09-02 DIAGNOSIS — Z1272 Encounter for screening for malignant neoplasm of vagina: Secondary | ICD-10-CM | POA: Diagnosis not present

## 2018-09-02 DIAGNOSIS — Z124 Encounter for screening for malignant neoplasm of cervix: Secondary | ICD-10-CM

## 2018-09-02 NOTE — Patient Instructions (Signed)
[facility] [address] [place] [address]  [facility] [place]  [address] [place] [address]  Phone: [phone]  

## 2018-09-02 NOTE — Progress Notes (Signed)
Gynecology Annual Exam  PCP: Jinny Sanders, MD  Chief Complaint:  Chief Complaint  Patient presents with  . Gynecologic Exam    History of Present Illness: Patient is a 42 y.o. G1P1001 presents for annual exam. The patient has no complaints today.   LMP: Patient's last menstrual period was 08/25/2018 (exact date). Average Interval: regular, 28 days Duration of flow: 6 days Heavy Menses: yes Clots: no Intermenstrual Bleeding: no Postcoital Bleeding: no Dysmenorrhea: no   The patient is sexually active. She currently uses IUD for contraception. She denies dyspareunia.  The patient does perform self breast exams.  There is no notable family history of breast or ovarian cancer in her family.  The patient wears seatbelts: yes.   The patient has regular exercise: not asked.    The patient denies current symptoms of depression.    Review of Systems: ROS  Past Medical History:  Past Medical History:  Diagnosis Date  . Acne   . Anemia    H/O  . Family history of adverse reaction to anesthesia   . GERD (gastroesophageal reflux disease)   . Hyperlipidemia   . Hypertension   . Neuromuscular disorder ([place])    SI Joint inflamation    Past Surgical History:  Past Surgical History:  Procedure Laterality Date  . APPENDECTOMY     2005   . CESAREAN SECTION  11/17/12  . CHOLECYSTECTOMY  03/31/14  . DILATION AND CURETTAGE OF UTERUS  2010, 07/2010   Polyps  . EYE SURGERY     Prism inserted in left eye  . HYSTEROSALPINGOGRAM  07/23/2010   [facility] ([facility])  . Paragard insertion  08/25/2015   [facility]  Georgianne Fick)  . TONSILLECTOMY N/A 04/09/2017   Procedure: TONSILLECTOMY;  Surgeon: Clyde Canterbury, MD;  Location: [facility] ORS;  Service: ENT;  Laterality: N/A;    Gynecologic History:  Patient's last menstrual period was 08/25/2018 (exact date). Contraception: IUD Paragard 08/25/2015 Last Pap: Results were: 04/13/2015 NIL and HR HPV negative  Last mammogram: baseline at  34 none since  Obstetric History: G1P1001  Family History:  Family History  Problem Relation Age of Onset  . Cancer Mother 31       Non-sm cell neuro endocrine tumors  . Breast cancer Paternal Aunt 85    Social History:  Social History   Socioeconomic History  . Marital status: Married    Spouse name: Not on file  . Number of children: Not on file  . Years of education: Not on file  . Highest education level: Not on file  Occupational History  . Occupation: Product/process development scientist: [employer]  Social Needs  . Financial resource strain: Not on file  . Food insecurity:    Worry: Not on file    Inability: Not on file  . Transportation needs:    Medical: Not on file    Non-medical: Not on file  Tobacco Use  . Smoking status: Former Smoker    Packs/day: 1.00    Years: 10.00    Pack years: 10.00    Types: Cigarettes    Last attempt to quit: 02/08/2015    Years since quitting: 3.5  . Smokeless tobacco: Never Used  Substance and Sexual Activity  . Alcohol use: No    Alcohol/week: 0.0 standard drinks  . Drug use: No  . Sexual activity: Yes    Birth control/protection: IUD  Lifestyle  . Physical activity:    Days per week:  Not on file    Minutes per session: Not on file  . Stress: Not on file  Relationships  . Social connections:    Talks on phone: Not on file    Gets together: Not on file    Attends religious service: Not on file    Active member of club or organization: Not on file    Attends meetings of clubs or organizations: Not on file    Relationship status: Not on file  . Intimate partner violence:    Fear of current or ex partner: Not on file    Emotionally abused: Not on file    Physically abused: Not on file    Forced sexual activity: Not on file  Other Topics Concern  . Not on file  Social History Narrative  . Not on file    Allergies:  Allergies  Allergen Reactions  . Sulfa Antibiotics Hives    Medications: Prior to  Admission medications   Medication Sig Start Date End Date Taking? Authorizing Provider  clobetasol cream (TEMOVATE) 4.65 % Apply 1 application topically 2 (two) times daily as needed (for ezcema on hands).   Yes [provider]  clonazePAM (KLONOPIN) 0.5 MG tablet Take 1 tablet (0.5 mg total) by mouth at bedtime. 07/21/18  Yes Bedsole, Amy E, MD  DULoxetine (CYMBALTA) 30 MG capsule Take 1 capsule (30 mg total) by mouth daily. 07/17/18  Yes Bedsole, Amy E, MD  DULoxetine (CYMBALTA) 60 MG capsule TAKE ONE CAPSULE BY MOUTH DAILY 08/11/18  Yes Bedsole, Amy E, MD  losartan (COZAAR) 100 MG tablet TAKE ONE TABLET BY MOUTH DAILY 08/11/18  Yes Bedsole, Amy E, MD  PARAGARD INTRAUTERINE COPPER IU 1 Device by Intrauterine route once.    Yes [provider]  ranitidine (ZANTAC) 150 MG capsule Take 150 mg by mouth at bedtime.   Yes [provider]  spironolactone (ALDACTONE) 50 MG tablet Take 50 mg by mouth 2 (two) times daily.    Yes [provider]    Physical Exam Vitals: Blood pressure 124/70, pulse 91, height 5\' 4"  (1.626 m), weight 261 lb (118.4 kg), last menstrual period 08/25/2018.  General: NAD HEENT: normocephalic, anicteric Thyroid: no enlargement, no palpable nodules Pulmonary: No increased work of breathing, CTAB Cardiovascular: RRR, distal pulses 2+ Breast: Breast symmetrical, no tenderness, no palpable nodules or masses, no skin or nipple retraction present, no nipple discharge.  No axillary or supraclavicular lymphadenopathy. Abdomen: NABS, soft, non-tender, non-distended.  Umbilicus without lesions.  No hepatomegaly, splenomegaly or masses palpable. No evidence of hernia  Genitourinary:  External: Normal external female genitalia.  Normal urethral meatus, normal Bartholin's and Skene's glands.    Vagina: Normal vaginal mucosa, no evidence of prolapse.    Cervix: Grossly normal in appearance, no bleeding, IUD strings visualized  Uterus: Non-enlarged,  mobile, normal contour.  No CMT  Adnexa: ovaries non-enlarged, no adnexal masses  Rectal: deferred  Lymphatic: no evidence of inguinal lymphadenopathy Extremities: no edema, erythema, or tenderness Neurologic: Grossly intact Psychiatric: mood appropriate, affect full  Female chaperone present for pelvic and breast  portions of the physical exam    Assessment: 42 y.o. G1P1001 routine annual exam  Plan: Problem List Items Addressed This Visit    None    Visit Diagnoses    Encounter for gynecological examination without abnormal finding    -  Primary   Relevant Orders   Cytology - PAP   Screening for malignant neoplasm of cervix  Relevant Orders   Cytology - PAP   Breast screening       Relevant Orders   MM 3D SCREEN BREAST BILATERAL      1) Mammogram - recommend yearly screening mammogram.  Mammogram Was ordered today   2) STI screening  was notoffered and therefore not obtained  3) ASCCP guidelines and rational discussed.  Patient opts for every 3 years screening interval  4) Contraception - the patient is currently using  IUD.  She is happy with her current form of contraception and plans to continue  5) Colonoscopy -- Screening recommended starting at age 68 for average risk individuals, age 53 for individuals deemed at increased risk (including African Americans) and recommended to continue until age 51.  For patient age 52-85 individualized approach is recommended.  Gold standard screening is via colonoscopy, Cologuard screening is an acceptable alternative for patient unwilling or unable to undergo colonoscopy.  "Colorectal cancer screening for average?risk adults: 2018 guideline update from the American Cancer Society"CA: A Cancer Journal for Clinicians: May 21, 2017   6) Routine healthcare maintenance including cholesterol, diabetes screening discussed managed by PCP  7) Return in about 1 year (around 09/03/2019) for annual.   Malachy Mood, MD,  [facility], [place] 09/02/2018, 11:45 AM

## 2018-09-04 ENCOUNTER — Ambulatory Visit: Payer: Commercial Indemnity | Admitting: Obstetrics and Gynecology

## 2018-09-07 LAB — CYTOLOGY - PAP
DIAGNOSIS: NEGATIVE
HPV: NOT DETECTED

## 2018-10-01 LAB — HEPATIC FUNCTION PANEL
ALT: 16 (ref 7–35)
AST: 15 (ref 13–35)
Alkaline Phosphatase: 52 (ref 25–125)
Bilirubin, Direct: 0.09 (ref 0.01–0.4)
Bilirubin, Total: 0.2

## 2018-10-01 LAB — LIPID PANEL
CHOLESTEROL: 126 (ref 0–200)
HDL: 28 — AB (ref 35–70)
LDL Cholesterol: 61
Triglycerides: 185 — AB (ref 40–160)

## 2018-10-01 LAB — BASIC METABOLIC PANEL
BUN: 12 (ref 4–21)
CREATININE: 0.8 (ref 0.5–1.1)
Glucose: 102
Potassium: 4.4 (ref 3.4–5.3)
Sodium: 140 (ref 137–147)

## 2018-10-01 LAB — TSH: TSH: 1.98 (ref 0.41–5.90)

## 2018-10-01 LAB — HEMOGLOBIN A1C: Hgb A1c MFr Bld: 6.4 — AB (ref 4.0–6.0)

## 2018-10-01 LAB — CBC AND DIFFERENTIAL
HCT: 35 — AB (ref 36–46)
HEMOGLOBIN: 11.2 — AB (ref 12.0–16.0)
PLATELETS: 403 — AB (ref 150–399)
WBC: 7.1

## 2018-11-11 ENCOUNTER — Telehealth: Payer: Self-pay | Admitting: Family Medicine

## 2018-11-11 DIAGNOSIS — R7303 Prediabetes: Secondary | ICD-10-CM

## 2018-11-11 DIAGNOSIS — E781 Pure hyperglyceridemia: Secondary | ICD-10-CM

## 2018-11-11 NOTE — Telephone Encounter (Signed)
-----   Message from Lendon Collar, RT sent at 11/03/2018 10:17 AM EST ----- Regarding: Lab orders for Thursday 11/12/18 Please enter CPE lab orders for 11/12/18. Thanks!

## 2018-11-12 ENCOUNTER — Other Ambulatory Visit: Payer: 59

## 2018-11-16 ENCOUNTER — Other Ambulatory Visit: Payer: 59

## 2018-11-17 ENCOUNTER — Encounter: Payer: Self-pay | Admitting: Family Medicine

## 2018-11-17 ENCOUNTER — Ambulatory Visit (INDEPENDENT_AMBULATORY_CARE_PROVIDER_SITE_OTHER): Payer: 59 | Admitting: Family Medicine

## 2018-11-17 VITALS — BP 100/60 | HR 92 | Temp 98.4°F | Ht 63.75 in | Wt 264.5 lb

## 2018-11-17 DIAGNOSIS — Z0000 Encounter for general adult medical examination without abnormal findings: Secondary | ICD-10-CM | POA: Diagnosis not present

## 2018-11-17 DIAGNOSIS — R7303 Prediabetes: Secondary | ICD-10-CM | POA: Diagnosis not present

## 2018-11-17 DIAGNOSIS — I10 Essential (primary) hypertension: Secondary | ICD-10-CM

## 2018-11-17 DIAGNOSIS — E781 Pure hyperglyceridemia: Secondary | ICD-10-CM

## 2018-11-17 DIAGNOSIS — F411 Generalized anxiety disorder: Secondary | ICD-10-CM

## 2018-11-17 DIAGNOSIS — F330 Major depressive disorder, recurrent, mild: Secondary | ICD-10-CM

## 2018-11-17 NOTE — Assessment & Plan Note (Signed)
Followed by psychiatry.. Now doing better on wellbutrin.

## 2018-11-17 NOTE — Assessment & Plan Note (Signed)
Improved, LDL at goal but HDl too low. Start exercise regimen 3-5 days a week.

## 2018-11-17 NOTE — Assessment & Plan Note (Signed)
Worsened control.. Work aggressively on low carb diet.. Stop sweet tea, info given on low glycemic index diet.

## 2018-11-17 NOTE — Assessment & Plan Note (Signed)
Well controlled. Continue current medication. Encouraged exercise, weight loss, healthy eating habits.  

## 2018-11-17 NOTE — Assessment & Plan Note (Signed)
Health issues are intricately related to her weight status. Discussed in detail with pt.

## 2018-11-17 NOTE — Progress Notes (Signed)
Subjective:    Patient ID: Andrea Colon, female    DOB: 09/13/1976, 42 y.o.   MRN: [id number]  HPI   The patient is here for annual wellness exam and preventative care.    MDD, moderate: Good control in office today on  She is seeing a psychiatrist. Weaning cymbalta , now on wellbutrin.  She is feeling better overall.  Using clonazepam at bedtime.   Office Visit from 11/17/2018 in [place] at [facility] Total Score  0     Hypertension:   Good control on losartan, spironolactone Using medication without problems or lightheadedness:  none Chest pain with exertion: none Edema:none Short of breath: none Average home BPs: Other issues:  Prediabetes: glucose 102.. A1C 6.4! 09/2018 Lab Results  Component Value Date   HGBA1C 6.1 04/09/2016   Cholesterol: Brought in labs from work 09/2018 Total 126, HDL: 28 LDL 61  Social History /Family History/Past Medical History reviewed in detail and updated in EMR if needed. Blood pressure 100/60, pulse 92, temperature 98.4 F (36.9 C), temperature source Oral, height 5' 3.75" (1.619 m), weight 264 lb 8 oz (120 kg), last menstrual period 11/16/2018.  Body mass index is 45.76 kg/m.   Wt Readings from Last 3 Encounters:  11/17/18 264 lb 8 oz (120 kg)  09/02/18 261 lb (118.4 kg)  11/07/17 259 lb 4 oz (117.6 kg)    Review of Systems  Constitutional: Negative for fatigue and fever.  HENT: Negative for congestion.   Eyes: Negative for pain.  Respiratory: Negative for cough and shortness of breath.   Cardiovascular: Negative for chest pain, palpitations and leg swelling.  Gastrointestinal: Negative for abdominal pain.  Genitourinary: Negative for dysuria and vaginal bleeding.  Musculoskeletal: Negative for back pain.  Neurological: Negative for syncope, light-headedness and headaches.  Psychiatric/Behavioral: Negative for dysphoric mood.       Objective:   Physical Exam  Constitutional: Vital signs are  normal. She appears well-developed and well-nourished. She is cooperative.  Non-toxic appearance. She does not appear ill. No distress.  HENT:  Head: Normocephalic.  Right Ear: Hearing, tympanic membrane, external ear and ear canal normal.  Left Ear: Hearing, tympanic membrane, external ear and ear canal normal.  Nose: Nose normal.  Eyes: Pupils are equal, round, and reactive to light. Conjunctivae, EOM and lids are normal. Lids are everted and swept, no foreign bodies found.  Neck: Trachea normal and normal range of motion. Neck supple. Carotid bruit is not present. No thyroid mass and no thyromegaly present.  Cardiovascular: Normal rate, regular rhythm, S1 normal, S2 normal, normal heart sounds and intact distal pulses. Exam reveals no gallop.  No murmur heard. Pulmonary/Chest: Effort normal and breath sounds normal. No respiratory distress. She has no wheezes. She has no rhonchi. She has no rales.  Abdominal: Soft. Normal appearance and bowel sounds are normal. She exhibits no distension, no fluid wave, no abdominal bruit and no mass. There is no hepatosplenomegaly. There is no tenderness. There is no rebound, no guarding and no CVA tenderness. No hernia.  Lymphadenopathy:    She has no cervical adenopathy.    She has no axillary adenopathy.  Neurological: She is alert. She has normal strength. No cranial nerve deficit or sensory deficit.  Skin: Skin is warm, dry and intact. No rash noted.  Psychiatric: Her speech is normal and behavior is normal. Judgment normal. Her mood appears not anxious. Cognition and memory are normal. She does not exhibit a depressed mood.  Body mass index is 45.76 kg/m.       Assessment & Plan:  The patient's preventative maintenance and recommended screening tests for an annual wellness exam were reviewed in full today. Brought up to date unless services declined.  Counselled on the importance of diet, exercise, and its role in overall health and  mortality. The patient's FH and SH was reviewed, including their home life, tobacco status, and drug and alcohol status.   Vaccines: Last Tdap in , flu at work STD screen: refused Colon: no early family history. Mammogram: pending PAP/DVE: nml pap, HPV neg GYN 08/2018  No ETOH, no smoking

## 2018-11-17 NOTE — Patient Instructions (Signed)
Work on low [employer]. Stop sweet tea.  Add exercise  3-5 times  a week.

## 2018-12-03 ENCOUNTER — Other Ambulatory Visit: Payer: Self-pay

## 2018-12-03 MED ORDER — LOSARTAN POTASSIUM 100 MG PO TABS: 100.0000 mg | ORAL_TABLET | Freq: Every day | ORAL | 1 refills | Status: DC

## 2019-01-15 NOTE — Telephone Encounter (Signed)
Please mail pt copy of SI joint exercises.. book placed on Andrea Colon's desktop.

## 2019-01-15 NOTE — Telephone Encounter (Signed)
Please let me know what exercises to print out and I will mail them to [place].

## 2019-01-27 ENCOUNTER — Ambulatory Visit (INDEPENDENT_AMBULATORY_CARE_PROVIDER_SITE_OTHER): Payer: 59 | Admitting: Psychology

## 2019-01-27 DIAGNOSIS — F411 Generalized anxiety disorder: Secondary | ICD-10-CM | POA: Diagnosis not present

## 2019-02-01 ENCOUNTER — Ambulatory Visit (INDEPENDENT_AMBULATORY_CARE_PROVIDER_SITE_OTHER): Payer: 59 | Admitting: Psychology

## 2019-02-01 DIAGNOSIS — F411 Generalized anxiety disorder: Secondary | ICD-10-CM | POA: Diagnosis not present

## 2019-02-25 ENCOUNTER — Ambulatory Visit (INDEPENDENT_AMBULATORY_CARE_PROVIDER_SITE_OTHER): Payer: 59 | Admitting: Psychology

## 2019-02-25 DIAGNOSIS — F900 Attention-deficit hyperactivity disorder, predominantly inattentive type: Secondary | ICD-10-CM | POA: Diagnosis not present

## 2019-03-08 ENCOUNTER — Ambulatory Visit (INDEPENDENT_AMBULATORY_CARE_PROVIDER_SITE_OTHER): Payer: 59 | Admitting: Psychology

## 2019-03-08 DIAGNOSIS — F411 Generalized anxiety disorder: Secondary | ICD-10-CM

## 2019-03-25 ENCOUNTER — Ambulatory Visit: Payer: 59 | Admitting: Psychology

## 2019-03-27 ENCOUNTER — Other Ambulatory Visit: Payer: Self-pay | Admitting: Family Medicine

## 2019-04-08 ENCOUNTER — Ambulatory Visit: Payer: 59 | Admitting: Psychology

## 2019-04-22 ENCOUNTER — Ambulatory Visit: Payer: 59 | Admitting: Psychology

## 2019-04-30 ENCOUNTER — Other Ambulatory Visit: Payer: Self-pay | Admitting: *Deleted

## 2019-04-30 MED ORDER — LOSARTAN POTASSIUM 50 MG PO TABS: 100.0000 mg | ORAL_TABLET | Freq: Every day | ORAL | 1 refills | Status: DC

## 2019-04-30 NOTE — Telephone Encounter (Signed)
Received fax from [facility] stating Losartan 100 mg is on backorder.  They are asking if they can switch to Losartan 50 mg to take 2 tablets daily?

## 2019-05-06 ENCOUNTER — Ambulatory Visit: Payer: 59 | Admitting: Psychology

## 2019-05-20 ENCOUNTER — Ambulatory Visit: Payer: 59 | Admitting: Psychology

## 2019-06-03 ENCOUNTER — Ambulatory Visit: Payer: 59 | Admitting: Psychology

## 2019-10-22 ENCOUNTER — Other Ambulatory Visit: Payer: Self-pay | Admitting: Family Medicine

## 2019-11-21 ENCOUNTER — Telehealth: Payer: Self-pay | Admitting: Family Medicine

## 2019-11-22 NOTE — Telephone Encounter (Signed)
Please schedule CPE with fasting labs prior with Dr. Bedsole.  

## 2019-11-23 NOTE — Telephone Encounter (Signed)
Tried calling pt mail box full 

## 2019-11-26 NOTE — Telephone Encounter (Signed)
Labs 1/5 cpx 1/8 Pt aware

## 2019-12-06 ENCOUNTER — Other Ambulatory Visit: Payer: Self-pay | Admitting: Family Medicine

## 2019-12-06 DIAGNOSIS — R7303 Prediabetes: Secondary | ICD-10-CM

## 2019-12-06 DIAGNOSIS — I10 Essential (primary) hypertension: Secondary | ICD-10-CM

## 2019-12-06 NOTE — Telephone Encounter (Signed)
Andrea Colon would like to do a ABO/RH when she comes in 12/28/2019 for her CPE labs.  She is aware insurance will not pay for it. Please put in future orders for CPE labs and ABO/RH.

## 2019-12-21 ENCOUNTER — Other Ambulatory Visit: Payer: Self-pay | Admitting: Family Medicine

## 2019-12-22 ENCOUNTER — Telehealth: Payer: Self-pay

## 2019-12-22 NOTE — Telephone Encounter (Signed)
LVM w COVID screen, front door and back lab info 12.30.2020 TLJ

## 2019-12-23 ENCOUNTER — Ambulatory Visit
Admission: EM | Admit: 2019-12-23 | Discharge: 2019-12-23 | Disposition: A | Discharge status: Home / Self Care | Payer: 59 | Attending: Emergency Medicine | Admitting: Emergency Medicine

## 2019-12-23 DIAGNOSIS — R05 Cough: Secondary | ICD-10-CM | POA: Insufficient documentation

## 2019-12-23 DIAGNOSIS — B349 Viral infection, unspecified: Secondary | ICD-10-CM | POA: Diagnosis present

## 2019-12-23 DIAGNOSIS — R059 Cough, unspecified: Secondary | ICD-10-CM

## 2019-12-23 DIAGNOSIS — J029 Acute pharyngitis, unspecified: Secondary | ICD-10-CM | POA: Diagnosis not present

## 2019-12-23 LAB — POC SARS CORONAVIRUS 2 AG -  ED: SARS Coronavirus 2 Ag: NEGATIVE

## 2019-12-23 MED ORDER — BENZONATATE 100 MG PO CAPS: 100.0000 mg | ORAL_CAPSULE | Freq: Three times a day (TID) | ORAL | 0 refills | Status: DC | PRN

## 2019-12-23 NOTE — ED Provider Notes (Signed)
Roderic Palau    CSN: [id number] Arrival date & time: 12/23/19  1503      History   Chief Complaint Chief Complaint  Patient presents with  . Sore Throat    HPI Andrea Colon is a 43 y.o. female.   Patient presents today with 4-day history of sore throat, nonproductive cough, diarrhea.  She denies fever, chills, difficulty swallowing, congestion, shortness of breath, vomiting, rash, or other symptoms.  No episodes of diarrhea today or yesterday.  No treatments attempted at home.  The history is provided by the patient.    Past Medical History:  Diagnosis Date  . Acne   . Anemia    H/O  . Family history of adverse reaction to anesthesia   . GERD (gastroesophageal reflux disease)   . Hyperlipidemia   . Hypertension   . Neuromuscular disorder ([place])    SI Joint inflamation    Patient Active Problem List   Diagnosis Date Noted  . Eustachian tube dysfunction 09/20/2016  . Low back pain without sciatica 04/16/2016  . Acne vulgaris 10/10/2015  . PVC's (premature ventricular contractions) 08/22/2015  . Chronic insomnia 07/07/2014  . Eczema, dyshidrotic 07/07/2014  . Generalized anxiety disorder 04/26/2014  . History of gallstones 01/21/2014  . History of hepatic disease 01/21/2014  . Morbid obesity ([place]) 10/19/2013  . Depression, major, recurrent, mild ([place]) 11/22/2011  . High triglycerides 05/21/2011  . Prediabetes 05/21/2011  . Essential hypertension, benign 02/05/2011  . PALPITATIONS, RECURRENT 10/31/2008    Past Surgical History:  Procedure Laterality Date  . APPENDECTOMY     2005   . CESAREAN SECTION  11/17/12  . CHOLECYSTECTOMY  03/31/14  . DILATION AND CURETTAGE OF UTERUS  2010, 07/2010   Polyps  . EYE SURGERY     Prism inserted in left eye  . HYSTEROSALPINGOGRAM  07/23/2010   [facility] ([facility])  . Paragard insertion  08/25/2015   [facility]  Georgianne Fick)  . TONSILLECTOMY N/A 04/09/2017   Procedure: TONSILLECTOMY;  Surgeon: Clyde Canterbury, MD;  Location: [facility] ORS;  Service: ENT;  Laterality: N/A;    OB History    Gravida  1   Para  1   Term  1   Preterm      AB      Living  1     SAB      TAB      Ectopic      Multiple      Live Births  1        Obstetric Comments  1st Menstrual Cycle:  11 1st Pregnancy:  36         Home Medications    Prior to Admission medications   Medication Sig Start Date End Date Taking? Authorizing Provider  buPROPion (WELLBUTRIN XL) 300 MG 24 hr tablet Take 300 mg by mouth daily.   Yes [provider]  clonazePAM (KLONOPIN) 0.5 MG tablet Take 1 tablet (0.5 mg total) by mouth at bedtime. 07/21/18  Yes Bedsole, Amy E, MD  famotidine (PEPCID) 20 MG tablet Take 20 mg by mouth 2 (two) times daily.   Yes [provider]  FLUoxetine (PROZAC) 10 MG tablet Take 20 mg by mouth daily.   Yes [provider]  losartan (COZAAR) 100 MG tablet TAKE ONE TABLET BY MOUTH DAILY 12/21/19  Yes Bedsole, Amy E, MD  PARAGARD INTRAUTERINE COPPER IU 1 Device by Intrauterine route once.    Yes [provider]  spironolactone (ALDACTONE)  50 MG tablet Take 50 mg by mouth 2 (two) times daily.    Yes [provider]  zolpidem (AMBIEN) 10 MG tablet Take 10 mg by mouth at bedtime as needed for sleep.   Yes [provider]  benzonatate (TESSALON) 100 MG capsule Take 1 capsule (100 mg total) by mouth 3 (three) times daily as needed for cough. 12/23/19   Sharion Balloon, NP  clobetasol cream (TEMOVATE) [id number] % Apply 1 application topically 2 (two) times daily as needed (for ezcema on hands).    [provider]    Family History Family History  Problem Relation Age of Onset  . Cancer Mother 70       Non-sm cell neuro endocrine tumors  . Breast cancer Paternal Aunt 43  . Aneurysm Father     Social History Social History   Tobacco Use  . Smoking status: Former Smoker    Packs/day: 1.00    Years: 10.00    Pack years: 10.00     Types: Cigarettes    Quit date: 02/08/2015    Years since quitting: 4.8  . Smokeless tobacco: Never Used  Substance Use Topics  . Alcohol use: No    Alcohol/week: 0.0 standard drinks  . Drug use: No     Allergies   Sulfa antibiotics   Review of Systems Review of Systems  Constitutional: Positive for fever. Negative for chills.  HENT: Positive for sore throat. Negative for congestion, ear pain and rhinorrhea.   Eyes: Negative for pain and visual disturbance.  Respiratory: Positive for cough.   Cardiovascular: Negative for palpitations.  Gastrointestinal: Negative for diarrhea, nausea and vomiting.  Genitourinary: Negative for dysuria and hematuria.  Musculoskeletal: Negative for arthralgias and back pain.  Skin: Negative for color change and rash.  Neurological: Negative for seizures and syncope.  All other systems reviewed and are negative.    Physical Exam Triage Vital Signs ED Triage Vitals [12/23/19 1504]  Enc Vitals Group     BP 118/81     Pulse Rate (!) 104     Resp 18     Temp 98.3 F (36.8 C)     Temp src      SpO2 96 %     Weight      Height      Head Circumference      Peak Flow      Pain Score 5     Pain Loc      Pain Edu?      Excl. in [place]?    No data found.  Updated Vital Signs BP 118/81   Pulse (!) 104   Temp 98.3 F (36.8 C)   Resp 18   LMP 12/23/2019   SpO2 96%   Visual Acuity Right Eye Distance:   Left Eye Distance:   Bilateral Distance:    Right Eye Near:   Left Eye Near:    Bilateral Near:     Physical Exam Vitals and nursing note reviewed.  Constitutional:      General: She is not in acute distress.    Appearance: She is well-developed. She is not ill-appearing.  HENT:     Head: Normocephalic and atraumatic.     Right Ear: Tympanic membrane normal.     Left Ear: Tympanic membrane normal.     Nose: Nose normal.     Mouth/Throat:     Mouth: Mucous membranes are moist.     Pharynx: Posterior oropharyngeal erythema  present. No oropharyngeal  exudate.  Eyes:     Conjunctiva/sclera: Conjunctivae normal.  Cardiovascular:     Rate and Rhythm: Normal rate and regular rhythm.     Heart sounds: No murmur.  Pulmonary:     Effort: Pulmonary effort is normal. No respiratory distress.     Breath sounds: Normal breath sounds.  Abdominal:     General: Bowel sounds are normal.     Palpations: Abdomen is soft.     Tenderness: There is no abdominal tenderness. There is no guarding or rebound.  Musculoskeletal:     Cervical back: Neck supple.  Skin:    General: Skin is warm and dry.     Findings: No rash.  Neurological:     General: No focal deficit present.     Mental Status: She is alert and oriented to person, place, and time.  Psychiatric:        Mood and Affect: Mood normal.        Behavior: Behavior normal.      UC Treatments / Results  Labs (all labs ordered are listed, but only abnormal results are displayed) Labs Reviewed  NOVEL CORONAVIRUS, NAA  CULTURE, GROUP A STREP ([place])  POC SARS CORONAVIRUS 2 AG -  ED  POCT RAPID STREP A (OFFICE)    EKG   Radiology No results found.  Procedures Procedures (including critical care time)  Medications Ordered in UC Medications - No data to display  Initial Impression / Assessment and Plan / UC Course  I have reviewed the triage vital signs and the nursing notes.  Pertinent labs & imaging results that were available during my care of the patient were reviewed by me and considered in my medical decision making (see chart for details).   Cough, sore throat, viral illness.  Treating cough with Tessalon Perles.  Instructed patient to take Tylenol as needed for fever or discomfort.  Rapid strep negative; throat culture pending.  POC COVID negative; PCR pending.  Instructed patient to self quarantine until the test result is back.  Instructed patient to go to the emergency department if develops high fever, shortness of breath, severe diarrhea, or  other concerning symptoms.  Patient agrees with plan of care.    Final Clinical Impressions(s) / UC Diagnoses   Final diagnoses:  Cough  Sore throat  Viral illness     Discharge Instructions     Take the Tessalon Perles as needed for your cough.  Take Tylenol as needed for fever or discomfort.    Your rapid strep test is negative.  A throat culture is pending; we will call you if it is positive requiring treatment.    Your rapid COVID test is negative; the send-out test is pending.  You should self quarantine until your test result is back and is negative.    Go to the emergency department if you develop high fever, shortness of breath, severe diarrhea, or other concerning symptoms.       ED Prescriptions    Medication Sig Dispense Auth. Provider   benzonatate (TESSALON) 100 MG capsule Take 1 capsule (100 mg total) by mouth 3 (three) times daily as needed for cough. 21 capsule Sharion Balloon, NP     PDMP not reviewed this encounter.   Sharion Balloon, NP 12/23/19 1550

## 2019-12-23 NOTE — Discharge Instructions (Addendum)
Take the [facility] as needed for your cough.  Take Tylenol as needed for fever or discomfort.    Your rapid strep test is negative.  A throat culture is pending; we will call you if it is positive requiring treatment.    Your rapid COVID test is negative; the send-out test is pending.  You should self quarantine until your test result is back and is negative.    Go to the emergency department if you develop high fever, shortness of breath, severe diarrhea, or other concerning symptoms.

## 2019-12-23 NOTE — ED Triage Notes (Signed)
Pt presents with complaints of cough, sore throat and diarrhea x 4 days. diarrhea has subsided. Denies any fever at home.

## 2019-12-25 LAB — NOVEL CORONAVIRUS, NAA: SARS-CoV-2, NAA: NOT DETECTED

## 2019-12-26 LAB — CULTURE, GROUP A STREP (THRC)

## 2019-12-28 ENCOUNTER — Other Ambulatory Visit: Payer: 59

## 2019-12-30 NOTE — Telephone Encounter (Signed)
Does patient need a virtual appointment here?

## 2019-12-31 ENCOUNTER — Other Ambulatory Visit: Payer: Self-pay

## 2019-12-31 ENCOUNTER — Encounter: Payer: 59 | Admitting: Family Medicine

## 2019-12-31 ENCOUNTER — Encounter: Payer: Self-pay | Admitting: Family Medicine

## 2019-12-31 ENCOUNTER — Ambulatory Visit (INDEPENDENT_AMBULATORY_CARE_PROVIDER_SITE_OTHER): Payer: 59 | Admitting: Family Medicine

## 2019-12-31 VITALS — Ht 64.0 in | Wt 254.0 lb

## 2019-12-31 DIAGNOSIS — H9201 Otalgia, right ear: Secondary | ICD-10-CM | POA: Diagnosis not present

## 2019-12-31 DIAGNOSIS — R05 Cough: Secondary | ICD-10-CM

## 2019-12-31 DIAGNOSIS — R059 Cough, unspecified: Secondary | ICD-10-CM | POA: Insufficient documentation

## 2019-12-31 MED ORDER — AZITHROMYCIN 250 MG PO TABS: ORAL_TABLET | ORAL | 0 refills | Status: DC

## 2019-12-31 NOTE — Assessment & Plan Note (Signed)
Neg rapid and PCR COVID test. Likely initially other viral illness, now with worsening cough, symptoms on day 14... will treat for bacterial superinfeciton.  Conitnue benzonatate. If SOB, chest tightness and ear pain not turning the corner in 48-72 hours consider call to be set up in respiratory clinic for in person exam of ear and lungs.  ER and return precautions given.

## 2019-12-31 NOTE — Assessment & Plan Note (Signed)
Unable to examine. Possible ETD vs bacteria infection. Treat with flonase as well as cover with antibiotics.

## 2019-12-31 NOTE — Patient Instructions (Signed)
Cough Neg rapid and PCR COVID test. Likely initially other viral illness, now with worsening cough, symptoms on day 14... will treat for bacterial superinfeciton.  Conitnue benzonatate. If SOB, chest tightness and ear pain not turning the corner in 48-72 hours, consider call to be set up in respiratory clinic for in person exam of ear and lungs.  ER and return precautions given.  Right ear pain Unable to examine. Possible ETD vs bacteria infection. Treat with flonase as well as cover with antibiotics.

## 2019-12-31 NOTE — Progress Notes (Signed)
VIRTUAL VISIT Due to national recommendations of social distancing due to [place] 19, a virtual visit is felt to be most appropriate for this patient at this time.   I connected with the patient on 12/31/19 at  9:00 AM EST by virtual telehealth platform and verified that I am speaking with the correct person using two identifiers.   I discussed the limitations, risks, security and privacy concerns of performing an evaluation and management service by  virtual telehealth platform and the availability of in person appointments. I also discussed with the patient that there may be a patient responsible charge related to this service. The patient expressed understanding and agreed to proceed.  Patient location: Home Provider Location: [facility] Participants: Eliezer Lofts and [name]   Chief Complaint  Patient presents with  . Cough    Seen at Urgent Care on 12/23/2019 and tested negative for Covid and Strep  . Shortness of Breath  . Sore Throat    History of Present Illness: 44 year old female with history of  HTN, obesity who presents with new onset cough and ST in last 14  Days. Symptoms started with GI upset emesis, diarrhea x 24 hours.  This resolved.  Now she has moist productive cough, ST. Some PND. Bilateral ear pain, R>L No face pain, no sinus pressure, minimal congestion. No fever. No body aches. She feels some constant tightness in chest,  No wheeze, only mild  SOB. She feels like cough is worse, but has some increased energy.    Seen at urgent care and tested negative on 12/31 with rapid as well as PCR covid test and strep test.  Has been using benzonatate .Marland Kitchen helps with sleep at night, occ using aleve.   COVID 19 screen No recent travel or known exposure to [place] The importance of social distancing was discussed today.   Review of Systems  Constitutional: Negative for chills and fever.  HENT: Positive for ear pain. Negative for congestion.   Eyes:  Negative for pain and redness.  Respiratory: Positive for cough and shortness of breath.   Cardiovascular: Negative for chest pain, palpitations and leg swelling.  Gastrointestinal: Negative for abdominal pain, blood in stool, constipation, diarrhea, nausea and vomiting.  Genitourinary: Negative for dysuria.  Musculoskeletal: Negative for falls and myalgias.  Skin: Negative for rash.  Neurological: Negative for dizziness.  Psychiatric/Behavioral: Negative for depression. The patient is not nervous/anxious.       Past Medical History:  Diagnosis Date  . Acne   . Anemia    H/O  . Family history of adverse reaction to anesthesia   . GERD (gastroesophageal reflux disease)   . Hyperlipidemia   . Hypertension   . Neuromuscular disorder ([place])    SI Joint inflamation    reports that she quit smoking about 4 years ago. Her smoking use included cigarettes. She has a 10.00 pack-year smoking history. She has never used smokeless tobacco. She reports that she does not drink alcohol or use drugs.   Current Outpatient Medications:  .  benzonatate (TESSALON) 100 MG capsule, Take 1 capsule (100 mg total) by mouth 3 (three) times daily as needed for cough., Disp: 21 capsule, Rfl: 0 .  buPROPion (WELLBUTRIN XL) 300 MG 24 hr tablet, Take 300 mg by mouth daily., Disp: , Rfl:  .  clobetasol cream (TEMOVATE) AB-123456789 %, Apply 1 application topically 2 (two) times daily as needed (for ezcema on hands)., Disp: , Rfl:  .  clonazePAM (KLONOPIN) 0.5  MG tablet, Take 1 tablet (0.5 mg total) by mouth at bedtime., Disp: 30 tablet, Rfl: 0 .  famotidine (PEPCID) 20 MG tablet, Take 20 mg by mouth 2 (two) times daily., Disp: , Rfl:  .  FLUoxetine (PROZAC) 20 MG tablet, Take 20 mg by mouth daily., Disp: , Rfl:  .  losartan (COZAAR) 100 MG tablet, TAKE ONE TABLET BY MOUTH DAILY, Disp: 30 tablet, Rfl: 0 .  Omega-3 Fatty Acids (FISH OIL) 1000 MG CAPS, Take 2 capsules by mouth daily., Disp: , Rfl:  .  PARAGARD INTRAUTERINE  COPPER IU, 1 Device by Intrauterine route once. , Disp: , Rfl:  .  spironolactone (ALDACTONE) 50 MG tablet, Take 50 mg by mouth 2 (two) times daily. , Disp: , Rfl:  .  VYVANSE 40 MG capsule, , Disp: , Rfl:  .  zolpidem (AMBIEN) 10 MG tablet, Take 10 mg by mouth at bedtime as needed for sleep., Disp: , Rfl:    Observations/Objective: Height 5\' 4"  (1.626 m), weight 254 lb (115.2 kg), last menstrual period 12/23/2019.  Physical Exam  Physical Exam Constitutional:      General: The patient is not in acute distress. Pulmonary:     Effort: Pulmonary effort is normal. No respiratory distress.  Neurological:     Mental Status: The patient is alert and oriented to person, place, and time.  Psychiatric:        Mood and Affect: Mood normal.        Behavior: Behavior normal.   Assessment and Plan Cough Neg rapid and PCR COVID test. Likely initially other viral illness, now with worsening cough, symptoms on day 14... will treat for bacterial superinfeciton.  Conitnue benzonatate. If SOB, chest tightness and ear pain not turning the corner in 48-72 hours consider call to be set up in respiratory clinic for in person exam of ear and lungs.  ER and return precautions given.  Right ear pain Unable to examine. Possible ETD vs bacteria infection. Treat with flonase as well as cover with antibiotics.     I discussed the assessment and treatment plan with the patient. The patient was provided an opportunity to ask questions and all were answered. The patient agreed with the plan and demonstrated an understanding of the instructions.   The patient was advised to call back or seek an in-person evaluation if the symptoms worsen or if the condition fails to improve as anticipated.     Eliezer Lofts, MD

## 2020-01-14 ENCOUNTER — Ambulatory Visit (INDEPENDENT_AMBULATORY_CARE_PROVIDER_SITE_OTHER): Payer: 59 | Admitting: Family Medicine

## 2020-01-14 VITALS — BP 132/80 | HR 102 | Temp 98.3°F | Ht 64.0 in | Wt 258.4 lb

## 2020-01-14 DIAGNOSIS — J40 Bronchitis, not specified as acute or chronic: Secondary | ICD-10-CM

## 2020-01-14 DIAGNOSIS — R0602 Shortness of breath: Secondary | ICD-10-CM | POA: Diagnosis not present

## 2020-01-14 DIAGNOSIS — R053 Chronic cough: Secondary | ICD-10-CM

## 2020-01-14 DIAGNOSIS — R05 Cough: Secondary | ICD-10-CM | POA: Diagnosis not present

## 2020-01-14 MED ORDER — PROMETHAZINE-DM 6.25-15 MG/5ML PO SYRP: 5.0000 mL | ORAL_SOLUTION | Freq: Four times a day (QID) | ORAL | 0 refills | Status: DC | PRN

## 2020-01-14 MED ORDER — ALBUTEROL SULFATE HFA 108 (90 BASE) MCG/ACT IN AERS: 2.0000 | INHALATION_SPRAY | Freq: Once | RESPIRATORY_TRACT | Status: DC

## 2020-01-14 MED ORDER — PREDNISONE 20 MG PO TABS: 40.0000 mg | ORAL_TABLET | Freq: Every day | ORAL | 0 refills | Status: AC

## 2020-01-14 MED ORDER — CEFDINIR 300 MG PO CAPS: 600.0000 mg | ORAL_CAPSULE | Freq: Every day | ORAL | 0 refills | Status: DC

## 2020-01-14 NOTE — Progress Notes (Signed)
Patient ID: Andrea Colon, female    DOB: August 10, 1976, 44 y.o.   MRN: [id number]  PCP: Jinny Sanders, MD  Chief Complaint  Patient presents with  . Cough    Subjective:  HPI Andrea Colon is a 44 y.o. female presents to [facility] for evaluation of persistent cough. Patient has tested negative for COVID-19 12/22/19, although has continued to experience cough with increased work of breathing with minimal exertional activities. Cough is occasionally productive and she continues to have poor appetite. She experienced sore throat at the onset of symptoms, although ST now resolved. Treated with Azithromycin, no significant improvement. No recent use of an inhaler. She is a former smoker, no prior diagnosis of chronic bronchitis or asthma.  Endorses chest tightness with breathing. No audible wheezing. She is afebrile. Social History   Socioeconomic History  . Marital status: Married    Spouse name: Not on file  . Number of children: Not on file  . Years of education: Not on file  . Highest education level: Not on file  Occupational History  . Occupation: Product/process development scientist: [employer]  Tobacco Use  . Smoking status: Former Smoker    Packs/day: 1.00    Years: 10.00    Pack years: 10.00    Types: Cigarettes    Quit date: 02/08/2015    Years since quitting: 4.9  . Smokeless tobacco: Never Used  Substance and Sexual Activity  . Alcohol use: No    Alcohol/week: 0.0 standard drinks  . Drug use: No  . Sexual activity: Yes    Birth control/protection: I.U.D.  Other Topics Concern  . Not on file  Social History Narrative  . Not on file   Social Determinants of Health   Financial Resource Strain:   . Difficulty of Paying Living Expenses: Not on file  Food Insecurity:   . Worried About Charity fundraiser in the Last Year: Not on file  . Ran Out of Food in the Last Year: Not on file  Transportation Needs:   . Lack of Transportation  (Medical): Not on file  . Lack of Transportation (Non-Medical): Not on file  Physical Activity:   . Days of Exercise per Week: Not on file  . Minutes of Exercise per Session: Not on file  Stress:   . Feeling of Stress : Not on file  Social Connections:   . Frequency of Communication with Friends and Family: Not on file  . Frequency of Social Gatherings with Friends and Family: Not on file  . Attends Religious Services: Not on file  . Active Member of Clubs or Organizations: Not on file  . Attends Archivist Meetings: Not on file  . Marital Status: Not on file  Intimate Partner Violence:   . Fear of Current or Ex-Partner: Not on file  . Emotionally Abused: Not on file  . Physically Abused: Not on file  . Sexually Abused: Not on file    Family History  Problem Relation Age of Onset  . Cancer Mother 52       Non-sm cell neuro endocrine tumors  . Breast cancer Paternal Aunt 71  . Aneurysm Father    Review of Systems  Pertinent negatives listed in HPI  Patient Active Problem List   Diagnosis Date Noted  . Cough 12/31/2019  . Right ear pain 12/31/2019  . Eustachian tube dysfunction 09/20/2016  . Low back pain without sciatica 04/16/2016  . Acne vulgaris 10/10/2015  .  PVC's (premature ventricular contractions) 08/22/2015  . Chronic insomnia 07/07/2014  . Eczema, dyshidrotic 07/07/2014  . Generalized anxiety disorder 04/26/2014  . History of gallstones 01/21/2014  . History of hepatic disease 01/21/2014  . Morbid obesity ([place]) 10/19/2013  . Depression, major, recurrent, mild ([place]) 11/22/2011  . High triglycerides 05/21/2011  . Prediabetes 05/21/2011  . Essential hypertension, benign 02/05/2011  . PALPITATIONS, RECURRENT 10/31/2008    Allergies  Allergen Reactions  . Sulfa Antibiotics Hives    Prior to Admission medications   Medication Sig Start Date End Date Taking? Authorizing Provider  buPROPion (WELLBUTRIN XL) 300 MG 24 hr tablet Take 300 mg by mouth  daily.   Yes [provider]  clobetasol cream (TEMOVATE) [id number] % Apply 1 application topically 2 (two) times daily as needed (for ezcema on hands).   Yes [provider]  clonazePAM (KLONOPIN) 0.5 MG tablet Take 1 tablet (0.5 mg total) by mouth at bedtime. 07/21/18  Yes Bedsole, Amy E, MD  famotidine (PEPCID) 20 MG tablet Take 20 mg by mouth 2 (two) times daily.   Yes [provider]  FLUoxetine (PROZAC) 20 MG tablet Take 20 mg by mouth daily. 11/30/19  Yes [provider]  losartan (COZAAR) 100 MG tablet TAKE ONE TABLET BY MOUTH DAILY 12/21/19  Yes Bedsole, Amy E, MD  Omega-3 Fatty Acids (FISH OIL) 1000 MG CAPS Take 2 capsules by mouth daily.   Yes [provider]  PARAGARD INTRAUTERINE COPPER IU 1 Device by Intrauterine route once.    Yes [provider]  spironolactone (ALDACTONE) 50 MG tablet Take 50 mg by mouth 2 (two) times daily.    Yes [provider]  VYVANSE 40 MG capsule  12/10/19  Yes [provider]  zolpidem (AMBIEN) 10 MG tablet Take 10 mg by mouth at bedtime as needed for sleep.   Yes [provider]    Past Medical, Surgical Family and Social History reviewed and updated.    Objective:   Today's Vitals   01/14/20 1807  BP: 132/80  Pulse: (!) 102  Temp: 98.3 F (36.8 C)  SpO2: 97%  Weight: 258 lb 6.4 oz (117.2 kg)  Height: 5\' 4"  (1.626 m)    Wt Readings from Last 3 Encounters:  01/14/20 258 lb 6.4 oz (117.2 kg)  12/31/19 254 lb (115.2 kg)  11/17/18 264 lb 8 oz (120 kg)    Physical Exam Constitutional:      Appearance: She is obese. She is ill-appearing. She is not toxic-appearing.  HENT:     Head: Normocephalic.     Right Ear: Tympanic membrane normal.     Left Ear: Tympanic membrane normal.     Nose: Rhinorrhea present.     Mouth/Throat:     Mouth: Mucous membranes are moist.     Pharynx: No oropharyngeal exudate or posterior oropharyngeal erythema.  Eyes:     Extraocular  Movements: Extraocular movements intact.  Cardiovascular:     Rate and Rhythm: Normal rate and regular rhythm.     Pulses: Normal pulses.     Heart sounds: Normal heart sounds.  Pulmonary:     Breath sounds: Decreased air movement present. Examination of the right-upper field reveals rhonchi. Examination of the left-upper field reveals rhonchi. Decreased breath sounds and rhonchi present.  Musculoskeletal:     Cervical back: Normal range of motion.  Skin:    General: Skin is warm and dry.  Neurological:     Mental Status: She is alert and oriented  to person, place, and time.  Psychiatric:        Mood and Affect: Mood normal.     No results found for: POCGLU  Lab Results  Component Value Date   HGBA1C 6.4 (A) 10/01/2018       Assessment & Plan:  1. Persistent cough 2. Shortness of breath 3. Bronchitis Acute bronchitis with persistent cough and shortness of breath on-going for 3 weeks. Negative COVID-19 test.  Treatment failure with Azithromycin. Will trial Cefdinir 600 mg daily x 10 days. Prednisone 40 mg x 5 days.  Albuterol inhaler 2 puffs every 4-6 hours as needed. Promethazine-DM every 6 hours as needed for cough. Chest x-ray pending to obtain at [facility] radiology department  Return for follow-up if symptoms worsen, otherwise follow-up with PCP.   -The patient was given clear instructions to go to ER or return to medical center if symptoms do not improve, worsen or new problems develop. The patient verbalized understanding.    Molli Barrows, FNP-C [facility], PRN Provider  [facility]. [place], [place] [facility] Phone: [phone] Clinic Fax: [phone] Clinic Hours: 5:30 pm -7:30 pm (Monday- Friday)

## 2020-01-14 NOTE — Patient Instructions (Addendum)
Obtain Chest X-ray on Monday to rule out Pneumonia  [facility], Open Monday-Friday 8:00-4:30 [place], [place] [address]   I am adding Cefdinir once daily for 10 days to cover for bronchitis. I am providing you with a Albuterol inhaler 2 puffs every 4-6 hours as needed for shortness of breath. Prednisone 40 mg x 5 days.(take in the morning with food).  Recommendation management to expedite recovery  include: Vitamin D 5,000 IU daily Vitamin C 500 mg twice daily Zinc 50 mg daily    If no improvement, follow-up with PCP.         Cough, Adult A cough helps to clear your throat and lungs. A cough may be a sign of an illness or another medical condition. An acute cough may only last 2-3 weeks, while a chronic cough may last 8 or more weeks. Many things can cause a cough. They include:  Germs (viruses or bacteria) that attack the airway.  Breathing in things that bother (irritate) your lungs.  Allergies.  Asthma.  Mucus that runs down the back of your throat (postnasal drip).  Smoking.  Acid backing up from the stomach into the tube that moves food from the mouth to the stomach (gastroesophageal reflux).  Some medicines.  Lung problems.  Other medical conditions, such as heart failure or a blood clot in the lung (pulmonary embolism). Follow these instructions at home: Medicines  Take over-the-counter and prescription medicines only as told by your doctor.  Talk with your doctor before you take medicines that stop a cough (coughsuppressants). Lifestyle   Do not smoke, and try not to be around smoke. Do not use any products that contain nicotine or tobacco, such as cigarettes, e-cigarettes, and chewing tobacco. If you need help quitting, ask your doctor.  Drink enough fluid to keep your pee (urine) pale yellow.  Avoid caffeine.  Do not drink alcohol if your doctor tells you not to drink. General instructions   Watch for any  changes in your cough. Tell your doctor about them.  Always cover your mouth when you cough.  Stay away from things that make you cough, such as perfume, candles, campfire smoke, or cleaning products.  If the air is dry, use a cool mist vaporizer or humidifier in your home.  If your cough is worse at night, try using extra pillows to raise your head up higher while you sleep.  Rest as needed.  Keep all follow-up visits as told by your doctor. This is important. Contact a doctor if:  You have new symptoms.  You cough up pus.  Your cough does not get better after 2-3 weeks, or your cough gets worse.  Cough medicine does not help your cough and you are not sleeping well.  You have pain that gets worse or pain that is not helped with medicine.  You have a fever.  You are losing weight and you do not know why.  You have night sweats. Get help right away if:  You cough up blood.  You have trouble breathing.  Your heartbeat is very fast. These symptoms may be an emergency. Do not wait to see if the symptoms will go away. Get medical help right away. Call your local emergency services (911 in the [place] Do not drive yourself to the hospital. Summary  A cough helps to clear your throat and lungs. Many things can cause a cough.  Take over-the-counter and prescription medicines only as told by your doctor.  Always cover your mouth when you cough.  Contact a doctor if you have new symptoms or you have a cough that does not get better or gets worse. This information is not intended to replace advice given to you by your health care provider. Make sure you discuss any questions you have with your health care provider. Document Revised: 12/28/2018 Document Reviewed: 12/28/2018 Elsevier Patient Education  [place].  Acute Bronchitis, Adult  Acute bronchitis is when air tubes in the lungs (bronchi) suddenly get swollen. The condition can make it hard for you to breathe.  In adults, acute bronchitis usually goes away within 2 weeks. A cough caused by bronchitis may last up to 3 weeks. Smoking, allergies, and asthma can make the condition worse. What are the causes? This condition is caused by:  Cold and flu viruses. The most common cause of this condition is the virus that causes the common cold.  Bacteria.  Substances that irritate the lungs, including: ? Smoke from cigarettes and other types of tobacco. ? Dust and pollen. ? Fumes from chemicals, gases, or burned fuel. ? Other materials that pollute indoor or outdoor air.  Close contact with someone who has acute bronchitis. What increases the risk? The following factors may make you more likely to develop this condition:  A weak body's defense system. This is also called the immune system.  Any condition that affects your lungs and breathing, such as asthma. What are the signs or symptoms? Symptoms of this condition include:  A cough.  Coughing up clear, yellow, or green mucus.  Wheezing.  Chest congestion.  Shortness of breath.  A fever.  Body aches.  Chills.  A sore throat. How is this treated? Acute bronchitis may go away over time without treatment. Your doctor may recommend:  Drinking more fluids.  Taking a medicine for a fever or cough.  Using a device that gets medicine into your lungs (inhaler).  Using a vaporizer or a humidifier. These are machines that add water or moisture in the air to help with coughing and poor breathing. Follow these instructions at home:  Activity  Get a lot of rest.  Avoid places where there are fumes from chemicals.  Return to your normal activities as told by your doctor. Ask your doctor what activities are safe for you. Lifestyle  Drink enough fluids to keep your pee (urine) pale yellow.  Do not drink alcohol.  Do not use any products that contain nicotine or tobacco, such as cigarettes, e-cigarettes, and chewing tobacco. If you  need help quitting, ask your doctor. Be aware that: ? Your bronchitis will get worse if you smoke or breathe in other people's smoke (secondhand smoke). ? Your lungs will heal faster if you quit smoking. General instructions  Take over-the-counter and prescription medicines only as told by your doctor.  Use an inhaler, cool mist vaporizer, or humidifier as told by your doctor.  Rinse your mouth often with salt water. To make salt water, dissolve -1 tsp (3-6 g) of salt in 1 cup (237 mL) of warm water.  Keep all follow-up visits as told by your doctor. This is important. How is this prevented? To lower your risk of getting this condition again:  Wash your hands often with soap and water. If soap and water are not available, use hand sanitizer.  Avoid contact with people who have cold symptoms.  Try not to touch your mouth, nose, or eyes with your hands.  Make sure to get  the flu shot every year. Contact a doctor if:  Your symptoms do not get better in 2 weeks.  You vomit more than once or twice.  You have symptoms of loss of fluid from your body (dehydration). These include: ? Dark urine. ? Dry skin or eyes. ? Increased thirst. ? Headaches. ? Confusion. ? Muscle cramps. Get help right away if:  You cough up blood.  You have chest pain.  You have very bad shortness of breath.  You become dehydrated.  You faint or keep feeling like you are going to faint.  You keep vomiting.  You have a very bad headache.  Your fever or chills get worse. These symptoms may be an emergency. Do not wait to see if the symptoms will go away. Get medical help right away. Call your local emergency services (911 in the [place] Do not drive yourself to the hospital. Summary  Acute bronchitis is when air tubes in the lungs (bronchi) suddenly get swollen. In adults, acute bronchitis usually goes away within 2 weeks.  Take over-the-counter and prescription medicines only as told by your  doctor.  Drink enough fluid to keep your pee (urine) pale yellow.  Contact a doctor if your symptoms do not improve after 2 weeks of treatment.  Get help right away if you cough up blood, faint, or have chest pain or shortness of breath. This information is not intended to replace advice given to you by your health care provider. Make sure you discuss any questions you have with your health care provider. Document Revised: 07/02/2019 Document Reviewed: 07/02/2019 Elsevier Patient Education  [place].

## 2020-01-18 ENCOUNTER — Other Ambulatory Visit: Payer: 59

## 2020-01-18 ENCOUNTER — Encounter: Payer: Self-pay | Admitting: Family Medicine

## 2020-01-20 ENCOUNTER — Other Ambulatory Visit: Payer: Self-pay | Admitting: Family Medicine

## 2020-01-21 ENCOUNTER — Encounter: Payer: 59 | Admitting: Family Medicine

## 2020-02-04 ENCOUNTER — Telehealth: Payer: Self-pay

## 2020-02-04 NOTE — Telephone Encounter (Signed)
LVM w COVID screen, front door and back lab info 2.12.2021 TLJ

## 2020-02-08 ENCOUNTER — Other Ambulatory Visit: Payer: Self-pay

## 2020-02-08 ENCOUNTER — Other Ambulatory Visit (INDEPENDENT_AMBULATORY_CARE_PROVIDER_SITE_OTHER): Payer: 59

## 2020-02-08 DIAGNOSIS — R7303 Prediabetes: Secondary | ICD-10-CM

## 2020-02-08 DIAGNOSIS — I10 Essential (primary) hypertension: Secondary | ICD-10-CM

## 2020-02-08 LAB — COMPREHENSIVE METABOLIC PANEL
ALT: 14 U/L (ref 0–35)
AST: 13 U/L (ref 0–37)
Albumin: 4 g/dL (ref 3.5–5.2)
Alkaline Phosphatase: 41 U/L (ref 39–117)
BUN: 11 mg/dL (ref 6–23)
CO2: 29 mEq/L (ref 19–32)
Calcium: 9.1 mg/dL (ref 8.4–10.5)
Chloride: 99 mEq/L (ref 96–112)
Creatinine, Ser: 0.92 mg/dL (ref 0.40–1.20)
GFR: 66.21 mL/min (ref >60.00)
Glucose, Bld: 125 mg/dL — ABNORMAL HIGH (ref 70–99)
Potassium: 3.8 mEq/L (ref 3.5–5.1)
Sodium: 135 mEq/L (ref 135–145)
Total Bilirubin: 0.3 mg/dL (ref 0.2–1.2)
Total Protein: 7.3 g/dL (ref 6.0–8.3)

## 2020-02-08 LAB — CBC WITH DIFFERENTIAL/PLATELET
Basophils Absolute: 0 10*3/uL (ref 0.0–0.1)
Basophils Relative: 0.4 % (ref 0.0–3.0)
Eosinophils Absolute: 0.2 10*3/uL (ref 0.0–0.7)
Eosinophils Relative: 2.7 % (ref 0.0–5.0)
HCT: 34.8 % — ABNORMAL LOW (ref 36.0–46.0)
Hemoglobin: 11.4 g/dL — ABNORMAL LOW (ref 12.0–15.0)
Lymphocytes Relative: 25.3 % (ref 12.0–46.0)
Lymphs Abs: 2.2 10*3/uL (ref 0.7–4.0)
MCHC: 32.9 g/dL (ref 30.0–36.0)
MCV: 77.2 fl — ABNORMAL LOW (ref 78.0–100.0)
Monocytes Absolute: 0.5 10*3/uL (ref 0.1–1.0)
Monocytes Relative: 5.4 % (ref 3.0–12.0)
Neutro Abs: 5.9 10*3/uL (ref 1.4–7.7)
Neutrophils Relative %: 66.2 % (ref 43.0–77.0)
Platelets: 382 10*3/uL (ref 150.0–400.0)
RBC: 4.5 Mil/uL (ref 3.87–5.11)
RDW: 15.7 % — ABNORMAL HIGH (ref 11.5–15.5)
WBC: 8.9 10*3/uL (ref 4.0–10.5)

## 2020-02-08 LAB — LIPID PANEL
Cholesterol: 153 mg/dL (ref 0–200)
HDL: 32.4 mg/dL — ABNORMAL LOW (ref >39.00)
NonHDL: 120.49
Total CHOL/HDL Ratio: 5
Triglycerides: 312 mg/dL — ABNORMAL HIGH (ref 0.0–149.0)
VLDL: 62.4 mg/dL — ABNORMAL HIGH (ref 0.0–40.0)

## 2020-02-08 LAB — HEMOGLOBIN A1C: Hgb A1c MFr Bld: 6.3 % (ref 4.6–6.5)

## 2020-02-08 LAB — LDL CHOLESTEROL, DIRECT: Direct LDL: 79 mg/dL

## 2020-02-08 NOTE — Progress Notes (Signed)
No critical labs need to be addressed urgently. We will discuss labs in detail at upcoming office visit.   

## 2020-02-10 ENCOUNTER — Encounter: Payer: 59 | Admitting: Family Medicine

## 2020-02-19 ENCOUNTER — Other Ambulatory Visit: Payer: Self-pay | Admitting: Family Medicine

## 2020-02-24 ENCOUNTER — Encounter: Payer: Self-pay | Admitting: Family Medicine

## 2020-02-24 ENCOUNTER — Ambulatory Visit (INDEPENDENT_AMBULATORY_CARE_PROVIDER_SITE_OTHER): Payer: 59 | Admitting: Family Medicine

## 2020-02-24 ENCOUNTER — Other Ambulatory Visit: Payer: Self-pay

## 2020-02-24 VITALS — BP 122/68 | HR 104 | Temp 97.6°F | Ht 64.15 in | Wt 264.5 lb

## 2020-02-24 DIAGNOSIS — E781 Pure hyperglyceridemia: Secondary | ICD-10-CM | POA: Diagnosis not present

## 2020-02-24 DIAGNOSIS — R7303 Prediabetes: Secondary | ICD-10-CM

## 2020-02-24 DIAGNOSIS — F330 Major depressive disorder, recurrent, mild: Secondary | ICD-10-CM | POA: Diagnosis not present

## 2020-02-24 DIAGNOSIS — Z0000 Encounter for general adult medical examination without abnormal findings: Secondary | ICD-10-CM | POA: Diagnosis not present

## 2020-02-24 MED ORDER — TRULICITY 0.75 MG/0.5ML ~~LOC~~ SOAJ: 0.7500 mg | SUBCUTANEOUS | 11 refills | Status: DC

## 2020-02-24 NOTE — Progress Notes (Signed)
Chief Complaint  Patient presents with  . Annual Exam    History of Present Illness: HPI  The patient is here for annual wellness exam and preventative care.    Hypertension:    Good control on spironolactone and Losartan BP Readings from Last 3 Encounters:  02/24/20 122/68  01/14/20 132/80  12/23/19 118/81  Using medication without problems or lightheadedness:  none Chest pain with exertion:none Edema:none Short of breath: Average home BPs: Other issues:   prediabetes  Lab Results  Component Value Date   HGBA1C 6.3 02/08/2020   Elevated Cholesterol:  At goal Lab Results  Component Value Date   CHOL 153 02/08/2020   HDL 32.40 (L) 02/08/2020   LDLCALC 61 10/01/2018   LDLDIRECT 79.0 02/08/2020   TRIG 312.0 (H) 02/08/2020   CHOLHDL 5 02/08/2020  Using medications without problems: Muscle aches:  Diet compliance: moderate.Marland Kitchen keto made her feel bad Exercise: occ Other complaints:  Doing virtual PT for SI joint issues.   Morbid obesity Body mass index is 45.19 kg/m. Wt Readings from Last 3 Encounters:  02/24/20 264 lb 8 oz (120 kg)  01/14/20 258 lb 6.4 oz (117.2 kg)  12/31/19 254 lb (115.2 kg)    MDD, GAD:  Stable control on  wellbutrin and prozac, klonopin for sleep. Followed by mood disorder clinic.   This visit occurred during the SARS-CoV-2 public health emergency.  Safety protocols were in place, including screening questions prior to the visit, additional usage of staff PPE, and extensive cleaning of exam room while observing appropriate contact time as indicated for disinfecting solutions.   COVID 19 screen:  No recent travel or known exposure to COVID19 The patient denies respiratory symptoms of COVID 19 at this time. The importance of social distancing was discussed today.     ROS    Past Medical History:  Diagnosis Date  . Acne   . Anemia    H/O  . Family history of adverse reaction to anesthesia   . GERD (gastroesophageal reflux disease)    . Hyperlipidemia   . Hypertension   . Neuromuscular disorder ([place])    SI Joint inflamation    reports that she quit smoking about 5 years ago. Her smoking use included cigarettes. She has a 10.00 pack-year smoking history. She has never used smokeless tobacco. She reports that she does not drink alcohol or use drugs.   Current Outpatient Medications:  .  buPROPion (WELLBUTRIN XL) 300 MG 24 hr tablet, Take 300 mg by mouth daily., Disp: , Rfl:  .  clobetasol cream (TEMOVATE) [id number] %, Apply 1 application topically 2 (two) times daily as needed (for ezcema on hands)., Disp: , Rfl:  .  clonazePAM (KLONOPIN) 0.5 MG tablet, Take 1 tablet (0.5 mg total) by mouth at bedtime., Disp: 30 tablet, Rfl: 0 .  famotidine (PEPCID) 20 MG tablet, Take 20 mg by mouth 2 (two) times daily., Disp: , Rfl:  .  FLUoxetine (PROZAC) 20 MG tablet, Take 20 mg by mouth daily., Disp: , Rfl:  .  losartan (COZAAR) 100 MG tablet, TAKE ONE TABLET BY MOUTH DAILY, Disp: 30 tablet, Rfl: 0 .  Omega-3 Fatty Acids (FISH OIL) 1000 MG CAPS, Take 2 capsules by mouth daily., Disp: , Rfl:  .  PARAGARD INTRAUTERINE COPPER IU, 1 Device by Intrauterine route once. , Disp: , Rfl:  .  spironolactone (ALDACTONE) 50 MG tablet, Take 50 mg by mouth 2 (two) times daily. , Disp: , Rfl:  .  VYVANSE 40 MG capsule, ,  Disp: , Rfl:  .  zolpidem (AMBIEN) 10 MG tablet, Take 10 mg by mouth at bedtime as needed for sleep., Disp: , Rfl:   Current Facility-Administered Medications:  .  albuterol (VENTOLIN HFA) 108 (90 Base) MCG/ACT inhaler 2 puff, 2 puff, Inhalation, Once, Harris, [name] Sage, FNP   Observations/Objective: Blood pressure 122/68, pulse (!) 104, temperature 97.6 F (36.4 C), height 5' 4.15" (1.629 m), weight 264 lb 8 oz (120 kg), last menstrual period 01/26/2020, SpO2 97 %.  Physical Exam Constitutional:      General: She is not in acute distress.    Appearance: Normal appearance. She is well-developed. She is obese. She is not  ill-appearing or toxic-appearing.  HENT:     Head: Normocephalic.     Right Ear: Hearing, tympanic membrane, ear canal and external ear normal.     Left Ear: Hearing, tympanic membrane, ear canal and external ear normal.     Nose: Nose normal.  Eyes:     General: Lids are normal. Lids are everted, no foreign bodies appreciated.     Conjunctiva/sclera: Conjunctivae normal.     Pupils: Pupils are equal, round, and reactive to light.  Neck:     Thyroid: No thyroid mass or thyromegaly.     Vascular: No carotid bruit.     Trachea: Trachea normal.  Cardiovascular:     Rate and Rhythm: Normal rate and regular rhythm.     Heart sounds: Normal heart sounds, S1 normal and S2 normal. No murmur. No gallop.   Pulmonary:     Effort: Pulmonary effort is normal. No respiratory distress.     Breath sounds: Normal breath sounds. No wheezing, rhonchi or rales.  Abdominal:     General: Bowel sounds are normal. There is no distension or abdominal bruit.     Palpations: Abdomen is soft. There is no fluid wave or mass.     Tenderness: There is no abdominal tenderness. There is no guarding or rebound.     Hernia: No hernia is present.  Musculoskeletal:     Cervical back: Normal range of motion and neck supple.  Lymphadenopathy:     Cervical: No cervical adenopathy.  Skin:    General: Skin is warm and dry.     Findings: No rash.  Neurological:     Mental Status: She is alert.     Cranial Nerves: No cranial nerve deficit.     Sensory: No sensory deficit.  Psychiatric:        Mood and Affect: Mood is not anxious or depressed.        Speech: Speech normal.        Behavior: Behavior normal. Behavior is cooperative.        Judgment: Judgment normal.      Assessment and Plan The patient's preventative maintenance and recommended screening tests for an annual wellness exam were reviewed in full today. Brought up to date unless services declined.  Counselled on the importance of diet, exercise, and  its role in overall health and mortality. The patient's FH and SH was reviewed, including their home life, tobacco status, and drug and alcohol status.    Vaccines: Last Tdap in 2013 STD screen: refused Colon: no early family history. Mammogram:due  PAP/DVE: nml pap, HPV neg GYN 08/2018 No ETOH, no smoking  Depression, major, recurrent, mild (HCC)  Moderate control.. Followed by psychiatry.  High triglycerides  Work on low fat diet. Take  fish oil, increase exercise.  Morbid obesity ([place]) Reviewed lifestyle  changes.  Start Trulicity.. close follow up in 1 month.  Prediabetes  Trulicity and weight loss trial with improve this overall.    Eliezer Lofts, MD

## 2020-02-24 NOTE — Assessment & Plan Note (Signed)
Work on [employer]. Take  fish oil, increase exercise.

## 2020-02-24 NOTE — Assessment & Plan Note (Signed)
Reviewed lifestyle changes.  Start Trulicity.. close follow up in 1 month.

## 2020-02-24 NOTE — Assessment & Plan Note (Signed)
Moderate control.. Followed by psychiatry.

## 2020-02-24 NOTE — Patient Instructions (Addendum)
Start Trulicity weekly.  Work on [employer], portion control consider my fitness pal app. Increase water.  Call to schedule mammogram on your own.     Preventive Care 50-45 Years Old, Female Preventive care refers to visits with your health care provider and lifestyle choices that can promote health and wellness. This includes:  A yearly physical exam. This may also be called an annual well check.  Regular dental visits and eye exams.  Immunizations.  Screening for certain conditions.  Healthy lifestyle choices, such as eating a healthy diet, getting regular exercise, not using drugs or products that contain nicotine and tobacco, and limiting alcohol use. What can I expect for my preventive care visit? Physical exam Your health care provider will check your:  Height and weight. This may be used to calculate body mass index (BMI), which tells if you are at a healthy weight.  Heart rate and blood pressure.  Skin for abnormal spots. Counseling Your health care provider may ask you questions about your:  Alcohol, tobacco, and drug use.  Emotional well-being.  Home and relationship well-being.  Sexual activity.  Eating habits.  Work and work Statistician.  Method of birth control.  Menstrual cycle.  Pregnancy history. What immunizations do I need?  Influenza (flu) vaccine  This is recommended every year. Tetanus, diphtheria, and pertussis (Tdap) vaccine  You may need a Td booster every 10 years. Varicella (chickenpox) vaccine  You may need this if you have not been vaccinated. Zoster (shingles) vaccine  You may need this after age 64. Measles, mumps, and rubella (MMR) vaccine  You may need at least one dose of MMR if you were born in 1957 or later. You may also need a second dose. Pneumococcal conjugate (PCV13) vaccine  You may need this if you have certain conditions and were not previously vaccinated. Pneumococcal polysaccharide (PPSV23)  vaccine  You may need one or two doses if you smoke cigarettes or if you have certain conditions. Meningococcal conjugate (MenACWY) vaccine  You may need this if you have certain conditions. Hepatitis A vaccine  You may need this if you have certain conditions or if you travel or work in places where you may be exposed to hepatitis A. Hepatitis B vaccine  You may need this if you have certain conditions or if you travel or work in places where you may be exposed to hepatitis B. Haemophilus influenzae type b (Hib) vaccine  You may need this if you have certain conditions. Human papillomavirus (HPV) vaccine  If recommended by your health care provider, you may need three doses over 6 months. You may receive vaccines as individual doses or as more than one vaccine together in one shot (combination vaccines). Talk with your health care provider about the risks and benefits of combination vaccines. What tests do I need? Blood tests  Lipid and cholesterol levels. These may be checked every 5 years, or more frequently if you are over 33 years old.  Hepatitis C test.  Hepatitis B test. Screening  Lung cancer screening. You may have this screening every year starting at age 45 if you have a 30-pack-year history of smoking and currently smoke or have quit within the past 15 years.  Colorectal cancer screening. All adults should have this screening starting at age 48 and continuing until age 67. Your health care provider may recommend screening at age 38 if you are at increased risk. You will have tests every 1-10 years, depending on your results and the  type of screening test.  Diabetes screening. This is done by checking your blood sugar (glucose) after you have not eaten for a while (fasting). You may have this done every 1-3 years.  Mammogram. This may be done every 1-2 years. Talk with your health care provider about when you should start having regular mammograms. This may depend on  whether you have a family history of breast cancer.  BRCA-related cancer screening. This may be done if you have a family history of breast, ovarian, tubal, or peritoneal cancers.  Pelvic exam and Pap test. This may be done every 3 years starting at age 5. Starting at age 50, this may be done every 5 years if you have a Pap test in combination with an HPV test. Other tests  Sexually transmitted disease (STD) testing.  Bone density scan. This is done to screen for osteoporosis. You may have this scan if you are at high risk for osteoporosis. Follow these instructions at home: Eating and drinking  Eat a diet that includes fresh fruits and vegetables, whole grains, lean protein, and low-fat dairy.  Take vitamin and mineral supplements as recommended by your health care provider.  Do not drink alcohol if: ? Your health care provider tells you not to drink. ? You are pregnant, may be pregnant, or are planning to become pregnant.  If you drink alcohol: ? Limit how much you have to 0-1 drink a day. ? Be aware of how much alcohol is in your drink. In the [place] one drink equals one 12 oz bottle of beer (355 mL), one 5 oz glass of wine (148 mL), or one 1 oz glass of hard liquor (44 mL). Lifestyle  Take daily care of your teeth and gums.  Stay active. Exercise for at least 30 minutes on 5 or more days each week.  Do not use any products that contain nicotine or tobacco, such as cigarettes, e-cigarettes, and chewing tobacco. If you need help quitting, ask your health care provider.  If you are sexually active, practice safe sex. Use a condom or other form of birth control (contraception) in order to prevent pregnancy and STIs (sexually transmitted infections).  If told by your health care provider, take low-dose aspirin daily starting at age 64. What's next?  Visit your health care provider once a year for a well check visit.  Ask your health care provider how often you should have your  eyes and teeth checked.  Stay up to date on all vaccines. This information is not intended to replace advice given to you by your health care provider. Make sure you discuss any questions you have with your health care provider. Document Revised: 08/20/2018 Document Reviewed: 08/20/2018 Elsevier Patient Education  2020 [employer].

## 2020-02-24 NOTE — Assessment & Plan Note (Signed)
Trulicity and weight loss trial with improve this overall.

## 2020-03-20 ENCOUNTER — Other Ambulatory Visit: Payer: Self-pay | Admitting: Family Medicine

## 2020-03-22 ENCOUNTER — Other Ambulatory Visit: Payer: Self-pay | Admitting: Family Medicine

## 2020-03-25 ENCOUNTER — Other Ambulatory Visit: Payer: Self-pay | Admitting: Family Medicine

## 2020-04-20 ENCOUNTER — Encounter: Payer: Self-pay | Admitting: Family Medicine

## 2020-04-20 ENCOUNTER — Telehealth (INDEPENDENT_AMBULATORY_CARE_PROVIDER_SITE_OTHER): Payer: 59 | Admitting: Family Medicine

## 2020-04-20 DIAGNOSIS — M545 Low back pain, unspecified: Secondary | ICD-10-CM

## 2020-04-20 DIAGNOSIS — G8929 Other chronic pain: Secondary | ICD-10-CM

## 2020-04-20 NOTE — Progress Notes (Signed)
VIRTUAL VISIT Due to national recommendations of social distancing due to [place] 19, a virtual visit is felt to be most appropriate for this patient at this time.   I connected with the patient on 04/20/20 at 10:00 AM EDT by virtual telehealth platform and verified that I am speaking with the correct person using two identifiers.   I discussed the limitations, risks, security and privacy concerns of performing an evaluation and management service by  virtual telehealth platform and the availability of in person appointments. I also discussed with the patient that there may be a patient responsible charge related to this service. The patient expressed understanding and agreed to proceed.  Patient location: Home Provider Location: [facility] Participants: Eliezer Lofts and [name]   Chief Complaint  Patient presents with  . Weight Check    Patient says she was unable to take the Trulicity because of extreme nausea and vomiting.    History of Present Illness:   44 year old female presents for follow up weight management.  She was unable to take the trulicity as it caused severe nausea and vomiting.  She has been working minimally of lifestyle changes given she was dealing with bronchitis.   Wt Readings from Last 3 Encounters:  04/20/20 264 lb 8 oz (120 kg)  02/24/20 264 lb 8 oz (120 kg)  01/14/20 258 lb 6.4 oz (117.2 kg)     COVID 19 screen No recent travel or known exposure to COVID19 The patient denies respiratory symptoms of COVID 19 at this time.  The importance of social distancing was discussed today.   Review of Systems  Constitutional: Negative for chills and fever.  HENT: Negative for congestion and ear pain.   Eyes: Negative for pain and redness.  Respiratory: Negative for cough and shortness of breath.   Cardiovascular: Negative for chest pain, palpitations and leg swelling.  Gastrointestinal: Negative for abdominal pain, blood in stool,  constipation, diarrhea, nausea and vomiting.  Genitourinary: Negative for dysuria.  Musculoskeletal: Negative for falls and myalgias.  Skin: Negative for rash.  Neurological: Negative for dizziness.  Psychiatric/Behavioral: Negative for depression. The patient is not nervous/anxious.       Past Medical History:  Diagnosis Date  . Acne   . Anemia    H/O  . Family history of adverse reaction to anesthesia   . GERD (gastroesophageal reflux disease)   . Hyperlipidemia   . Hypertension   . Neuromuscular disorder ([place])    SI Joint inflamation    reports that she quit smoking about 5 years ago. Her smoking use included cigarettes. She has a 10.00 pack-year smoking history. She has never used smokeless tobacco. She reports that she does not drink alcohol or use drugs.   Current Outpatient Medications:  .  buPROPion (WELLBUTRIN XL) 300 MG 24 hr tablet, Take 300 mg by mouth daily., Disp: , Rfl:  .  clonazePAM (KLONOPIN) 0.5 MG tablet, Take 1 tablet (0.5 mg total) by mouth at bedtime., Disp: 30 tablet, Rfl: 0 .  famotidine (PEPCID) 20 MG tablet, Take 20 mg by mouth daily. , Disp: , Rfl:  .  FLUoxetine (PROZAC) 20 MG tablet, Take 20 mg by mouth daily., Disp: , Rfl:  .  losartan (COZAAR) 100 MG tablet, TAKE ONE TABLET BY MOUTH DAILY, Disp: 30 tablet, Rfl: 5 .  Omega-3 Fatty Acids (FISH OIL) 1000 MG CAPS, Take 2 capsules by mouth daily., Disp: , Rfl:  .  PARAGARD INTRAUTERINE COPPER IU, 1 Device  by Intrauterine route once. , Disp: , Rfl:  .  spironolactone (ALDACTONE) 50 MG tablet, Take 50 mg by mouth 2 (two) times daily. , Disp: , Rfl:  .  clobetasol cream (TEMOVATE) [id number] %, Apply 1 application topically 2 (two) times daily as needed (for ezcema on hands)., Disp: , Rfl:  .  Dulaglutide (TRULICITY) [id number] 0000000 SOPN, Inject 0.75 mg into the skin once a week. (Patient not taking: Reported on 04/20/2020), Disp: 3 mL, Rfl: 11   Observations/Objective: Height 5' 4.15" (1.629 m), weight 264 lb 8 oz  (120 kg).  Physical Exam  Physical Exam Constitutional:      General: The patient is not in acute distress. Pulmonary:     Effort: Pulmonary effort is normal. No respiratory distress.  Neurological:     Mental Status: The patient is alert and oriented to person, place, and time.  Psychiatric:        Mood and Affect: Mood normal.        Behavior: Behavior normal.   Assessment and Plan Morbid obesity (HCC) Intolerant of Trulicity. Stimulants contraindicated given palpitations history. Refer to medical weight loss program.  New goals till referral appt: Log food on app on phone Start low back stretching 1-2 times daily.  Low back pain without sciatica Given info on low back healthy mechanics and home PT.     I discussed the assessment and treatment plan with the patient. The patient was provided an opportunity to ask questions and all were answered. The patient agreed with the plan and demonstrated an understanding of the instructions.   The patient was advised to call back or seek an in-person evaluation if the symptoms worsen or if the condition fails to improve as anticipated.     Eliezer Lofts, MD

## 2020-04-20 NOTE — Assessment & Plan Note (Addendum)
Intolerant of Trulicity. Stimulants contraindicated given palpitations history. Refer to medical weight loss program.  New goals till referral appt: Log food on app on phone Start low back stretching 1-2 times daily.

## 2020-04-20 NOTE — Patient Instructions (Signed)
Start logging food on [employer] or MyFitness pal.  Start low back stretching 2 times daily  The medical weight loss clinic will call to set up an appt in the next 2-3 weeks.   Low Back Sprain or Strain Rehab Ask your health care provider which exercises are safe for you. Do exercises exactly as told by your health care provider and adjust them as directed. It is normal to feel mild stretching, pulling, tightness, or discomfort as you do these exercises. Stop right away if you feel sudden pain or your pain gets worse. Do not begin these exercises until told by your health care provider. Stretching and range-of-motion exercises These exercises warm up your muscles and joints and improve the movement and flexibility of your back. These exercises also help to relieve pain, numbness, and tingling. Lumbar rotation  1. Lie on your back on a firm surface and bend your knees. 2. Straighten your arms out to your sides so each arm forms a 90-degree angle (right angle) with a side of your body. 3. Slowly move (rotate) both of your knees to one side of your body until you feel a stretch in your lower back (lumbar). Try not to let your shoulders lift off the floor. 4. Hold this position for __________ seconds. 5. Tense your abdominal muscles and slowly move your knees back to the starting position. 6. Repeat this exercise on the other side of your body. Repeat __________ times. Complete this exercise __________ times a day. Single knee to chest  1. Lie on your back on a firm surface with both legs straight. 2. Bend one of your knees. Use your hands to move your knee up toward your chest until you feel a gentle stretch in your lower back and buttock. ? Hold your leg in this position by holding on to the front of your knee. ? Keep your other leg as straight as possible. 3. Hold this position for __________ seconds. 4. Slowly return to the starting position. 5. Repeat with your other leg. Repeat __________  times. Complete this exercise __________ times a day. Prone extension on elbows  1. Lie on your abdomen on a firm surface (prone position). 2. Prop yourself up on your elbows. 3. Use your arms to help lift your chest up until you feel a gentle stretch in your abdomen and your lower back. ? This will place some of your body weight on your elbows. If this is uncomfortable, try stacking pillows under your chest. ? Your hips should stay down, against the surface that you are lying on. Keep your hip and back muscles relaxed. 4. Hold this position for __________ seconds. 5. Slowly relax your upper body and return to the starting position. Repeat __________ times. Complete this exercise __________ times a day. Strengthening exercises These exercises build strength and endurance in your back. Endurance is the ability to use your muscles for a long time, even after they get tired. Pelvic tilt This exercise strengthens the muscles that lie deep in the abdomen. 1. Lie on your back on a firm surface. Bend your knees and keep your feet flat on the floor. 2. Tense your abdominal muscles. Tip your pelvis up toward the ceiling and flatten your lower back into the floor. ? To help with this exercise, you may place a small towel under your lower back and try to push your back into the towel. 3. Hold this position for __________ seconds. 4. Let your muscles relax completely before you repeat this  exercise. Repeat __________ times. Complete this exercise __________ times a day. Alternating arm and leg raises  1. Get on your hands and knees on a firm surface. If you are on a hard floor, you may want to use padding, such as an exercise mat, to cushion your knees. 2. Line up your arms and legs. Your hands should be directly below your shoulders, and your knees should be directly below your hips. 3. Lift your left leg behind you. At the same time, raise your right arm and straighten it in front of you. ? Do not  lift your leg higher than your hip. ? Do not lift your arm higher than your shoulder. ? Keep your abdominal and back muscles tight. ? Keep your hips facing the ground. ? Do not arch your back. ? Keep your balance carefully, and do not hold your breath. 4. Hold this position for __________ seconds. 5. Slowly return to the starting position. 6. Repeat with your right leg and your left arm. Repeat __________ times. Complete this exercise __________ times a day. Abdominal set with straight leg raise  1. Lie on your back on a firm surface. 2. Bend one of your knees and keep your other leg straight. 3. Tense your abdominal muscles and lift your straight leg up, 4-6 inches (10-15 cm) off the ground. 4. Keep your abdominal muscles tight and hold this position for __________ seconds. ? Do not hold your breath. ? Do not arch your back. Keep it flat against the ground. 5. Keep your abdominal muscles tense as you slowly lower your leg back to the starting position. 6. Repeat with your other leg. Repeat __________ times. Complete this exercise __________ times a day. Single leg lower with bent knees 1. Lie on your back on a firm surface. 2. Tense your abdominal muscles and lift your feet off the floor, one foot at a time, so your knees and hips are bent in 90-degree angles (right angles). ? Your knees should be over your hips and your lower legs should be parallel to the floor. 3. Keeping your abdominal muscles tense and your knee bent, slowly lower one of your legs so your toe touches the ground. 4. Lift your leg back up to return to the starting position. ? Do not hold your breath. ? Do not let your back arch. Keep your back flat against the ground. 5. Repeat with your other leg. Repeat __________ times. Complete this exercise __________ times a day. Posture and body mechanics Good posture and healthy body mechanics can help to relieve stress in your body's tissues and joints. Body mechanics  refers to the movements and positions of your body while you do your daily activities. Posture is part of body mechanics. Good posture means:  Your spine is in its natural S-curve position (neutral).  Your shoulders are pulled back slightly.  Your head is not tipped forward. Follow these guidelines to improve your posture and body mechanics in your everyday activities. Standing   When standing, keep your spine neutral and your feet about hip width apart. Keep a slight bend in your knees. Your ears, shoulders, and hips should line up.  When you do a task in which you stand in one place for a long time, place one foot up on a stable object that is 2-4 inches (5-10 cm) high, such as a footstool. This helps keep your spine neutral. Sitting   When sitting, keep your spine neutral and keep your feet flat on the  floor. Use a footrest, if necessary, and keep your thighs parallel to the floor. Avoid rounding your shoulders, and avoid tilting your head forward.  When working at a desk or a computer, keep your desk at a height where your hands are slightly lower than your elbows. Slide your chair under your desk so you are close enough to maintain good posture.  When working at a computer, place your monitor at a height where you are looking straight ahead and you do not have to tilt your head forward or downward to look at the screen. Resting  When lying down and resting, avoid positions that are most painful for you.  If you have pain with activities such as sitting, bending, stooping, or squatting, lie in a position in which your body does not bend very much. For example, avoid curling up on your side with your arms and knees near your chest (fetal position).  If you have pain with activities such as standing for a long time or reaching with your arms, lie with your spine in a neutral position and bend your knees slightly. Try the following positions: ? Lying on your side with a pillow between  your knees. ? Lying on your back with a pillow under your knees. Lifting   When lifting objects, keep your feet at least shoulder width apart and tighten your abdominal muscles.  Bend your knees and hips and keep your spine neutral. It is important to lift using the strength of your legs, not your back. Do not lock your knees straight out.  Always ask for help to lift heavy or awkward objects. This information is not intended to replace advice given to you by your health care provider. Make sure you discuss any questions you have with your health care provider. Document Revised: 04/02/2019 Document Reviewed: 12/31/2018 Elsevier Patient Education  [place].

## 2020-04-20 NOTE — Assessment & Plan Note (Signed)
Given info on low back healthy mechanics and home PT.

## 2020-04-26 ENCOUNTER — Ambulatory Visit (INDEPENDENT_AMBULATORY_CARE_PROVIDER_SITE_OTHER): Payer: 59 | Admitting: Bariatrics

## 2020-04-26 ENCOUNTER — Other Ambulatory Visit: Payer: Self-pay

## 2020-04-26 ENCOUNTER — Encounter (INDEPENDENT_AMBULATORY_CARE_PROVIDER_SITE_OTHER): Payer: Self-pay | Admitting: Bariatrics

## 2020-04-26 VITALS — BP 110/77 | HR 98 | Temp 98.2°F | Ht 64.0 in | Wt 259.0 lb

## 2020-04-26 DIAGNOSIS — R0602 Shortness of breath: Secondary | ICD-10-CM | POA: Diagnosis not present

## 2020-04-26 DIAGNOSIS — Z0289 Encounter for other administrative examinations: Secondary | ICD-10-CM

## 2020-04-26 DIAGNOSIS — R7303 Prediabetes: Secondary | ICD-10-CM

## 2020-04-26 DIAGNOSIS — Z6841 Body Mass Index (BMI) 40.0 and over, adult: Secondary | ICD-10-CM

## 2020-04-26 DIAGNOSIS — Z1331 Encounter for screening for depression: Secondary | ICD-10-CM

## 2020-04-26 DIAGNOSIS — M2550 Pain in unspecified joint: Secondary | ICD-10-CM

## 2020-04-26 DIAGNOSIS — Z9189 Other specified personal risk factors, not elsewhere classified: Secondary | ICD-10-CM | POA: Diagnosis not present

## 2020-04-26 DIAGNOSIS — I10 Essential (primary) hypertension: Secondary | ICD-10-CM

## 2020-04-26 DIAGNOSIS — E781 Pure hyperglyceridemia: Secondary | ICD-10-CM | POA: Diagnosis not present

## 2020-04-26 DIAGNOSIS — D508 Other iron deficiency anemias: Secondary | ICD-10-CM

## 2020-04-26 DIAGNOSIS — E559 Vitamin D deficiency, unspecified: Secondary | ICD-10-CM

## 2020-04-26 DIAGNOSIS — E538 Deficiency of other specified B group vitamins: Secondary | ICD-10-CM

## 2020-04-26 DIAGNOSIS — R5383 Other fatigue: Secondary | ICD-10-CM

## 2020-04-26 NOTE — Progress Notes (Signed)
Chief Complaint:   OBESITY Andrea Colon (MR# [id number]) is a 44 y.o. female who presents for evaluation and treatment of obesity and related comorbidities. Current BMI is Body mass index is 44.46 kg/m.Marland Kitchen Andrea Colon has been struggling with her weight for many years and has been unsuccessful in either losing weight, maintaining weight loss, or reaching her healthy weight goal.  Andrea Colon is currently in the action stage of change and ready to dedicate time achieving and maintaining a healthier weight. Andrea Colon is interested in becoming our patient and working on intensive lifestyle modifications including (but not limited to) diet and exercise for weight loss.  Andrea Colon states that she is a picky eater and does not like to cook.   Andrea Colon's habits were reviewed today and are as follows: Her family eats some meals together, her desired weight loss is 109-119 lbs, she started gaining weight in 2005-2006, her heaviest weight ever was 264 pounds, she is a picky eater and doesn't like to eat healthier foods, she craves pasta, cereal, milk, and cheese, she snacks frequently in the evenings, she sometimes makes poor food choices, she sometimes has problems with excessive hunger and she struggles with emotional eating.  Depression Screen Andrea Colon's Food and Mood (modified PHQ-9) score was 17.  Depression screen PHQ 2/9 04/26/2020  Decreased Interest 3  Down, Depressed, Hopeless 3  PHQ - 2 Score 6  Altered sleeping 2  Tired, decreased energy 3  Change in appetite 3  Feeling bad or failure about yourself  2  Trouble concentrating 0  Moving slowly or fidgety/restless 1  Suicidal thoughts 2  PHQ-9 Score 19  Difficult doing work/chores -   Subjective:   Other fatigue. Andrea Colon denies daytime somnolence and admits to waking up still tired. Andrea Colon generally gets 5-6 hours of sleep per night, and states that she does not sleep well most nights.  Snoring is probably present. Apneic episodes are not present.  Epworth Sleepiness Score is 4.  Shortness of breath on exertion. Andrea Colon notes increasing shortness of breath with certain activities and seems to be worsening over time with weight gain. She notes getting out of breath sooner with activity than she used to. This has gotten worse recently. Andrea Colon denies shortness of breath at rest or orthopnea.  Essential hypertension. Andrea Colon takes Losartan and spironolactone.  BP Readings from Last 3 Encounters:  04/26/20 110/77  02/24/20 122/68  01/14/20 132/80   Lab Results  Component Value Date   CREATININE 0.92 02/08/2020   CREATININE 0.8 10/01/2018   CREATININE 0.88 08/20/2016   High triglycerides. Triglycerides were elevated at 312.0 on 02/08/2020.  Arthralgia, unspecified joint. Andrea Colon reports pain with walking.  Vitamin D deficiency. Andrea Colon is taking Vitamin D supplementation.  B12 nutritional deficiency. Andrea Colon is taking B12 supplementation.  Prediabetes. Andrea Colon has a diagnosis of prediabetes based on her elevated HgA1c and was informed this puts her at greater risk of developing diabetes. She continues to work on diet and exercise to decrease her risk of diabetes. She denies nausea or hypoglycemia. Andrea Colon has a history of PCOS.  Lab Results  Component Value Date   HGBA1C 6.3 02/08/2020   No results found for: INSULIN  Other iron deficiency anemia. Andrea Colon is taking iron occasionally.   CBC Latest Ref Rng & Units 02/08/2020 10/01/2018 04/09/2017  WBC 4.0 - 10.5 K/uL 8.9 7.1 -  Hemoglobin 12.0 - 15.0 g/dL 11.4(L) 11.2(A) 11.2(L)  Hematocrit 36.0 - 46.0 % 34.8(L) 35(A) 33.0(L)  Platelets 150.0 - 400.0 K/uL 382.0  403(A) -   Lab Results  Component Value Date   FERRITIN 24 01/21/2017   Lab Results  Component Value Date   VITAMINB12 440 01/21/2017   Depression screening. Andrea Colon had a strongly positive depression screen with a PHQ-9 score of 17.  At risk for diabetes mellitus. Andrea Colon is at higher than average risk for developing  diabetes due to her prediabetes.   Assessment/Plan:   Other fatigue. Andrea Colon does feel that her weight is causing her energy to be lower than it should be. Fatigue may be related to obesity, depression or many other causes. Labs will be ordered, and in the meanwhile, Andrea Colon will focus on self care including making healthy food choices, increasing physical activity and focusing on stress reduction.  EKG 12-Lead, T3, T4, free, TSH  Shortness of breath on exertion. Andrea Colon does feel that she gets out of breath more easily that she used to when she exercises. Andrea Colon's shortness of breath appears to be obesity related and exercise induced. She has agreed to work on weight loss and gradually increase exercise to treat her exercise induced shortness of breath. Will continue to monitor closely.  Essential hypertension. Andrea Colon is working on healthy weight loss and exercise to improve blood pressure control. We will watch for signs of hypotension as she continues her lifestyle modifications. She will continue her medications as directed.  High triglycerides. Will watch triglyceride levels.  Arthralgia, unspecified joint. Andrea Colon will work on weight loss.  Vitamin D deficiency. Low Vitamin D level contributes to fatigue and are associated with obesity, breast, and colon cancer. VITAMIN D 25 Hydroxy (Vit-D Deficiency, Fractures) level ordered today.  B12 nutritional deficiency. The diagnosis was reviewed with the patient. Counseling provided today, see below. We will continue to monitor. Orders and follow up as documented in patient record. Vitamin B12 level ordered today.  Counseling . The body needs vitamin B12: to make red blood cells; to make DNA; and to help the nerves work properly so they can carry messages from the brain to the body.  . The main causes of vitamin B12 deficiency include dietary deficiency, digestive diseases, pernicious anemia, and having a surgery in which part of the stomach or small  intestine is removed.  . Certain medicines can make it harder for the body to absorb vitamin B12. These medicines include: heartburn medications; some antibiotics; some medications used to treat diabetes, gout, and high cholesterol.  . In some cases, there are no symptoms of this condition. If the condition leads to anemia or nerve damage, various symptoms can occur, such as weakness or fatigue, shortness of breath, and numbness or tingling in your hands and feet.   . Treatment:  o May include taking vitamin B12 supplements.  o Avoid alcohol.  o Eat lots of healthy foods that contain vitamin B12: - Beef, pork, chicken, [place], and organ meats, such as liver.  - Seafood: This includes clams, rainbow trout, salmon, tuna, and haddock.  - Eggs.  - Cereal and dairy products that are fortified: This means that vitamin B12 has been added to the food.     Prediabetes. Andrea Colon will continue to work on weight loss, exercise, increasing healthy fats and protein, and decreasing simple carbohydrates to help decrease the risk of diabetes. Insulin, random ordered today.  Other iron deficiency anemia. Orders and follow up as documented in patient record. Andrea Colon will start taking iron supplementation OTC.  Counseling . Iron is essential for our bodies to make red blood cells.  Reasons that  someone may be deficient include: an iron-deficient diet (more likely in those following vegan or vegetarian diets), women with heavy menses, patients with GI disorders or poor absorption, patients that have had bariatric surgery, frequent blood donors, patients with cancer, and patients with heart disease.   Marland Kitchen An iron supplement has been recommended. This is found over-the-counter.  Andrea Colon foods include dark leafy greens, red and white meats, eggs, seafood, and beans.   . Certain foods and drinks prevent your body from absorbing iron properly. Avoid eating these foods in the same meal as iron-rich foods or with iron  supplements. These foods include: coffee, black tea, and red wine; milk, dairy products, and foods that are high in calcium; beans and soybeans; whole grains.  . Constipation can be a side effect of iron supplementation. Increased water and fiber intake are helpful. Water goal: > 2 liters/day. Fiber goal: > 25 grams/day.  Depression screening. Andrea Colon had a positive depression screening. Depression is commonly associated with obesity and often results in emotional eating behaviors. We will monitor this closely and work on CBT to help improve the non-hunger eating patterns. Referral to Psychology may be required if no improvement is seen as she continues in our clinic.  At risk for diabetes mellitus. Andrea Colon was given approximately 15 minutes of diabetes education and counseling today. We discussed intensive lifestyle modifications today with an emphasis on weight loss as well as increasing exercise and decreasing simple carbohydrates in her diet. We also reviewed medication options with an emphasis on risk versus benefit of those discussed.   Repetitive spaced learning was employed today to elicit superior memory formation and behavioral change.  Class 3 severe obesity with serious comorbidity and body mass index (BMI) of 40.0 to 44.9 in adult, unspecified obesity type ([place]).  Andrea Colon is currently in the action stage of change and her goal is to continue with weight loss efforts. I recommend Nandini begin the structured treatment plan as follows:  She has agreed to the Category 4 Plan.  She will work on meal planning, stop all sugary drinks, and will increase her water intake.  We reviewed with the patient labs from 02/08/2020 including CMP, lipids, and A1c.  Exercise goals: All adults should avoid inactivity. Some physical activity is better than none, and adults who participate in any amount of physical activity gain some health benefits.   Behavioral modification strategies: increasing lean  protein intake, decreasing simple carbohydrates, increasing vegetables, increasing water intake, decreasing eating out, no skipping meals, meal planning and cooking strategies, keeping healthy foods in the home and planning for success.  She was informed of the importance of frequent follow-up visits to maximize her success with intensive lifestyle modifications for her multiple health conditions. She was informed we would discuss her lab results at her next visit unless there is a critical issue that needs to be addressed sooner. Andrea Colon agreed to keep her next visit at the agreed upon time to discuss these results.  Objective:   Blood pressure 110/77, pulse 98, temperature 98.2 F (36.8 C), height 5\' 4"  (1.626 m), weight 259 lb (117.5 kg), last menstrual period 04/15/2020, SpO2 97 %. Body mass index is 44.46 kg/m.  EKG: Sinus  Rhythm with a rate of 94 BPM. RSR(V1) - nondiagnostic. Probably normal.  Indirect Calorimeter completed today shows a VO2 of 395 and a REE of 2750.  Her calculated basal metabolic rate is [id number] thus her basal metabolic rate is better than expected.  General: Cooperative, alert, well  developed, in no acute distress. HEENT: Conjunctivae and lids unremarkable. Cardiovascular: Regular rhythm.  Lungs: Normal work of breathing. Neurologic: No focal deficits.   Lab Results  Component Value Date   CREATININE 0.92 02/08/2020   BUN 11 02/08/2020   NA 135 02/08/2020   K 3.8 02/08/2020   CL 99 02/08/2020   CO2 29 02/08/2020   Lab Results  Component Value Date   ALT 14 02/08/2020   AST 13 02/08/2020   ALKPHOS 41 02/08/2020   BILITOT 0.3 02/08/2020   Lab Results  Component Value Date   HGBA1C 6.3 02/08/2020   HGBA1C 6.4 (A) 10/01/2018   HGBA1C 6.1 04/09/2016   HGBA1C 5.5 05/14/2011   No results found for: INSULIN Lab Results  Component Value Date   TSH 1.98 10/01/2018   Lab Results  Component Value Date   CHOL 153 02/08/2020   HDL 32.40 (L) 02/08/2020    LDLCALC 61 10/01/2018   LDLDIRECT 79.0 02/08/2020   TRIG 312.0 (H) 02/08/2020   CHOLHDL 5 02/08/2020   Lab Results  Component Value Date   WBC 8.9 02/08/2020   HGB 11.4 (L) 02/08/2020   HCT 34.8 (L) 02/08/2020   MCV 77.2 (L) 02/08/2020   PLT 382.0 02/08/2020   Lab Results  Component Value Date   FERRITIN 24 01/21/2017   Attestation Statements:   Reviewed by clinician on day of visit: allergies, medications, problem list, medical history, surgical history, family history, social history, and previous encounter notes.  Migdalia Dk, am acting as Location manager for [employer], DO   I have reviewed the above documentation for accuracy and completeness, and I agree with the above. Jearld Lesch, DO

## 2020-04-27 LAB — VITAMIN B12: Vitamin B-12: 590 pg/mL (ref 232–1245)

## 2020-04-27 LAB — T3: T3, Total: 160 ng/dL (ref 71–180)

## 2020-04-27 LAB — VITAMIN D 25 HYDROXY (VIT D DEFICIENCY, FRACTURES): Vit D, 25-Hydroxy: 28.3 ng/mL — ABNORMAL LOW (ref 30.0–100.0)

## 2020-04-27 LAB — INSULIN, RANDOM: INSULIN: 35.1 u[IU]/mL — ABNORMAL HIGH (ref 2.6–24.9)

## 2020-04-27 LAB — TSH: TSH: 1.76 u[IU]/mL (ref 0.450–4.500)

## 2020-04-27 LAB — T4, FREE: Free T4: 0.99 ng/dL (ref 0.82–1.77)

## 2020-04-28 ENCOUNTER — Encounter (INDEPENDENT_AMBULATORY_CARE_PROVIDER_SITE_OTHER): Payer: Self-pay | Admitting: Bariatrics

## 2020-05-01 ENCOUNTER — Encounter (INDEPENDENT_AMBULATORY_CARE_PROVIDER_SITE_OTHER): Payer: Self-pay | Admitting: Bariatrics

## 2020-05-01 DIAGNOSIS — E559 Vitamin D deficiency, unspecified: Secondary | ICD-10-CM | POA: Insufficient documentation

## 2020-05-01 NOTE — Telephone Encounter (Signed)
Please review

## 2020-05-10 ENCOUNTER — Ambulatory Visit (INDEPENDENT_AMBULATORY_CARE_PROVIDER_SITE_OTHER): Payer: 59 | Admitting: Bariatrics

## 2020-05-10 ENCOUNTER — Encounter (INDEPENDENT_AMBULATORY_CARE_PROVIDER_SITE_OTHER): Payer: Self-pay | Admitting: Bariatrics

## 2020-05-10 ENCOUNTER — Other Ambulatory Visit: Payer: Self-pay

## 2020-05-10 VITALS — BP 120/74 | HR 80 | Temp 98.3°F | Ht 64.0 in | Wt 257.0 lb

## 2020-05-10 DIAGNOSIS — R7303 Prediabetes: Secondary | ICD-10-CM

## 2020-05-10 DIAGNOSIS — E559 Vitamin D deficiency, unspecified: Secondary | ICD-10-CM

## 2020-05-10 DIAGNOSIS — Z9189 Other specified personal risk factors, not elsewhere classified: Secondary | ICD-10-CM

## 2020-05-10 DIAGNOSIS — Z6841 Body Mass Index (BMI) 40.0 and over, adult: Secondary | ICD-10-CM

## 2020-05-10 MED ORDER — VITAMIN D (ERGOCALCIFEROL) 1.25 MG (50000 UNIT) PO CAPS: 50000.0000 [IU] | ORAL_CAPSULE | ORAL | 0 refills | Status: DC

## 2020-05-11 MED ORDER — METFORMIN HCL 500 MG PO TABS: 500.0000 mg | ORAL_TABLET | Freq: Every morning | ORAL | 0 refills | Status: DC

## 2020-05-11 NOTE — Progress Notes (Signed)
Chief Complaint:   OBESITY Andrea Colon is here to discuss her progress with her obesity treatment plan along with follow-up of her obesity related diagnoses. Andrea Colon is on the Category 4 Plan and states she is following her eating plan approximately 50% of the time. Andrea Colon states she is doing 0 minutes 0 times per week.  Today's visit was #: 2 Starting weight: 259 lbs Starting date: 04/26/2020 Today's weight: 257 lbs Today's date: 05/10/2020 Total lbs lost to date: 2 Total lbs lost since last in-office visit: 2  Interim History: Andrea Colon is down 2 lbs since  Her last visit. She is disappointed somewhat. She is keeping her calories at 1400 calories.  Subjective:   1. Vitamin D deficiency Andrea Colon is staking Vit D OTC, and her last Vit D level was 28.3.  2. Pre-diabetes Andrea Colon notes increased hunger in the afternoon and evening.  3. At risk for hypoglycemia Andrea Colon is at increased risk for hypoglycemia due to pre-diabetes.   Assessment/Plan:   1. Vitamin D deficiency Low Vitamin D level contributes to fatigue and are associated with obesity, breast, and colon cancer. We will refill prescription Vitamin D for 1 month. Andrea Colon will follow-up for routine testing of Vitamin D, at least 2-3 times per year to avoid over-replacement.   - Vitamin D, Ergocalciferol, (DRISDOL) 1.25 MG (50000 UNIT) CAPS capsule; Take 1 capsule (50,000 Units total) by mouth every 7 (seven) days.  Dispense: 4 capsule; Refill: 0  2. Pre-diabetes Andrea Colon will continue to work on weight loss, exercise, and decreasing simple carbohydrates to help decrease the risk of diabetes. She will increase her activities and increase healthy fat and protein. Andrea Colon agreed to start metformin 500 mg 1 tablet PO at lunch #30 with no refills.  3. At risk for hypoglycemia Andrea Colon was given approximately 15 minutes of counseling today regarding prevention of hypoglycemia. She was advised of symptoms of hypoglycemia. Andrea Colon was instructed to  avoid skipping meals, eat regular protein rich meals and schedule low calorie snacks as needed.   Repetitive spaced learning was employed today to elicit superior memory formation and behavioral change  4. Class 3 severe obesity with serious comorbidity and body mass index (BMI) of 40.0 to 44.9 in adult, unspecified obesity type ([facility]) Andrea Colon is currently in the action stage of change. As such, her goal is to continue with weight loss efforts. She has agreed to the Category 4 Plan.   I reviewed labs today (04/26/2020-Vit D, B12, insulin, and thyroid panel).  Exercise goals: No exercise has been prescribed at this time.  Behavioral modification strategies: increasing lean protein intake, decreasing simple carbohydrates, increasing vegetables, increasing water intake, decreasing eating out, no skipping meals, meal planning and cooking strategies and keeping healthy foods in the home.  Andrea Colon has agreed to follow-up with our clinic in 2 weeks. She was informed of the importance of frequent follow-up visits to maximize her success with intensive lifestyle modifications for her multiple health conditions.   Objective:   Blood pressure 120/74, pulse 80, temperature 98.3 F (36.8 C), height 5\' 4"  (1.626 m), weight 257 lb (116.6 kg), last menstrual period 05/10/2020, SpO2 99 %. Body mass index is 44.11 kg/m.  General: Cooperative, alert, well developed, in no acute distress. HEENT: Conjunctivae and lids unremarkable. Cardiovascular: Regular rhythm.  Lungs: Normal work of breathing. Neurologic: No focal deficits.   Lab Results  Component Value Date   CREATININE 0.92 02/08/2020   BUN 11 02/08/2020   NA 135 02/08/2020   K  3.8 02/08/2020   CL 99 02/08/2020   CO2 29 02/08/2020   Lab Results  Component Value Date   ALT 14 02/08/2020   AST 13 02/08/2020   ALKPHOS 41 02/08/2020   BILITOT 0.3 02/08/2020   Lab Results  Component Value Date   HGBA1C 6.3 02/08/2020   HGBA1C 6.4 (A) 10/01/2018    HGBA1C 6.1 04/09/2016   HGBA1C 5.5 05/14/2011   Lab Results  Component Value Date   INSULIN 35.1 (H) 04/26/2020   Lab Results  Component Value Date   TSH 1.760 04/26/2020   Lab Results  Component Value Date   CHOL 153 02/08/2020   HDL 32.40 (L) 02/08/2020   LDLCALC 61 10/01/2018   LDLDIRECT 79.0 02/08/2020   TRIG 312.0 (H) 02/08/2020   CHOLHDL 5 02/08/2020   Lab Results  Component Value Date   WBC 8.9 02/08/2020   HGB 11.4 (L) 02/08/2020   HCT 34.8 (L) 02/08/2020   MCV 77.2 (L) 02/08/2020   PLT 382.0 02/08/2020   Lab Results  Component Value Date   FERRITIN 24 01/21/2017   Attestation Statements:   Reviewed by clinician on day of visit: allergies, medications, problem list, medical history, surgical history, family history, social history, and previous encounter notes.   Wilhemena Durie, am acting as Location manager for [employer], DO.  I have reviewed the above documentation for accuracy and completeness, and I agree with the above. Jearld Lesch, DO

## 2020-05-23 ENCOUNTER — Encounter (INDEPENDENT_AMBULATORY_CARE_PROVIDER_SITE_OTHER): Payer: Self-pay | Admitting: Bariatrics

## 2020-05-23 ENCOUNTER — Other Ambulatory Visit: Payer: Self-pay

## 2020-05-23 ENCOUNTER — Ambulatory Visit (INDEPENDENT_AMBULATORY_CARE_PROVIDER_SITE_OTHER): Payer: 59 | Admitting: Bariatrics

## 2020-05-23 VITALS — BP 113/77 | HR 83 | Temp 97.6°F | Ht 67.0 in | Wt 250.0 lb

## 2020-05-23 DIAGNOSIS — Z6841 Body Mass Index (BMI) 40.0 and over, adult: Secondary | ICD-10-CM | POA: Diagnosis not present

## 2020-05-23 DIAGNOSIS — E781 Pure hyperglyceridemia: Secondary | ICD-10-CM

## 2020-05-23 DIAGNOSIS — I10 Essential (primary) hypertension: Secondary | ICD-10-CM

## 2020-05-23 NOTE — Progress Notes (Signed)
Chief Complaint:   OBESITY Andrea Colon is here to discuss her progress with her obesity treatment plan along with follow-up of her obesity related diagnoses. Andrea Colon is on the Category 4 Plan and states she is following her eating plan approximately 75% of the time. Andrea Colon states she is exercising 0 minutes 0 times per week.  Today's visit was #: 3 Starting weight: 259 lbs Starting date: 04/26/2020 Today's weight: 250 lbs Today's date: 05/23/2020 Total lbs lost to date: 9 Total lbs lost since last in-office visit: 7  Interim History: Andrea Colon is down 7 lbs. She has been hungry for 2 weeks (is not eating all of the food).  Subjective:   Essential hypertension. Blood pressure is controlled today. Andrea Colon is taking Cozaar and aldactone.  BP Readings from Last 3 Encounters:  05/23/20 113/77  05/10/20 120/74  04/26/20 110/77   Lab Results  Component Value Date   CREATININE 0.92 02/08/2020   CREATININE 0.8 10/01/2018   CREATININE 0.88 08/20/2016   High triglycerides. Andrea Colon is taking Fish Oil. Last triglyceride level 312.0 on 02/08/2020.  Assessment/Plan:   Essential hypertension. Andrea Colon is working on healthy weight loss and exercise to improve blood pressure control. We will watch for signs of hypotension as she continues her lifestyle modifications. She will continue her medications as directed.   High triglycerides. Andrea Colon will decrease carbohydrates, increase exercise, and increase PUFA's and MUFA's.  Class 3 severe obesity with serious comorbidity and body mass index (BMI) of 40.0 to 44.9 in adult, unspecified obesity type (Andrea Colon).  Andrea Colon is currently in the action stage of change. As such, her goal is to continue with weight loss efforts. She has agreed to the Category 4 Plan.   She will work on meal planning, intentional eating, and will exercise with resistance bands.  Exercise goals: All adults should avoid inactivity. Some physical activity is better than none,  and adults who participate in any amount of physical activity gain some health benefits.  Behavioral modification strategies: increasing lean protein intake, decreasing simple carbohydrates, increasing vegetables, increasing water intake, decreasing eating out, no skipping meals, meal planning and cooking strategies, keeping healthy foods in the home and planning for success.  Andrea Colon has agreed to follow-up with our clinic in 2 weeks. She was informed of the importance of frequent follow-up visits to maximize her success with intensive lifestyle modifications for her multiple health conditions.   Objective:   Blood pressure 113/77, pulse 83, temperature 97.6 F (36.4 C), height 5\' 7"  (1.702 m), weight 250 lb (113.4 kg), last menstrual period 05/10/2020, SpO2 98 %. Body mass index is 39.16 kg/m.  General: Cooperative, alert, well developed, in no acute distress. HEENT: Conjunctivae and lids unremarkable. Cardiovascular: Regular rhythm.  Lungs: Normal work of breathing. Neurologic: No focal deficits.   Lab Results  Component Value Date   CREATININE 0.92 02/08/2020   BUN 11 02/08/2020   NA 135 02/08/2020   K 3.8 02/08/2020   CL 99 02/08/2020   CO2 29 02/08/2020   Lab Results  Component Value Date   ALT 14 02/08/2020   AST 13 02/08/2020   ALKPHOS 41 02/08/2020   BILITOT 0.3 02/08/2020   Lab Results  Component Value Date   HGBA1C 6.3 02/08/2020   HGBA1C 6.4 (A) 10/01/2018   HGBA1C 6.1 04/09/2016   HGBA1C 5.5 05/14/2011   Lab Results  Component Value Date   INSULIN 35.1 (H) 04/26/2020   Lab Results  Component Value Date   TSH 1.760  04/26/2020   Lab Results  Component Value Date   CHOL 153 02/08/2020   HDL 32.40 (L) 02/08/2020   LDLCALC 61 10/01/2018   LDLDIRECT 79.0 02/08/2020   TRIG 312.0 (H) 02/08/2020   CHOLHDL 5 02/08/2020   Lab Results  Component Value Date   WBC 8.9 02/08/2020   HGB 11.4 (L) 02/08/2020   HCT 34.8 (L) 02/08/2020   MCV 77.2 (L) 02/08/2020    PLT 382.0 02/08/2020   Lab Results  Component Value Date   FERRITIN 24 01/21/2017   Attestation Statements:   Reviewed by clinician on day of visit: allergies, medications, problem list, medical history, surgical history, family history, social history, and previous encounter notes.  Time spent on visit including pre-visit chart review and post-visit charting and care was 20 minutes.   Migdalia Dk, am acting as Location manager for [employer], DO   I have reviewed the above documentation for accuracy and completeness, and I agree with the above. Jearld Lesch, DO

## 2020-06-06 ENCOUNTER — Ambulatory Visit (INDEPENDENT_AMBULATORY_CARE_PROVIDER_SITE_OTHER): Payer: 59 | Admitting: Family Medicine

## 2020-06-13 ENCOUNTER — Ambulatory Visit (INDEPENDENT_AMBULATORY_CARE_PROVIDER_SITE_OTHER): Payer: 59 | Admitting: Bariatrics

## 2020-06-20 ENCOUNTER — Encounter (INDEPENDENT_AMBULATORY_CARE_PROVIDER_SITE_OTHER): Payer: Self-pay | Admitting: Bariatrics

## 2020-06-20 ENCOUNTER — Other Ambulatory Visit: Payer: Self-pay

## 2020-06-20 ENCOUNTER — Ambulatory Visit (INDEPENDENT_AMBULATORY_CARE_PROVIDER_SITE_OTHER): Payer: 59 | Admitting: Bariatrics

## 2020-06-20 VITALS — BP 114/79 | HR 93 | Temp 98.3°F | Ht 67.0 in | Wt 246.0 lb

## 2020-06-20 DIAGNOSIS — Z9189 Other specified personal risk factors, not elsewhere classified: Secondary | ICD-10-CM

## 2020-06-20 DIAGNOSIS — E559 Vitamin D deficiency, unspecified: Secondary | ICD-10-CM | POA: Diagnosis not present

## 2020-06-20 DIAGNOSIS — R7303 Prediabetes: Secondary | ICD-10-CM

## 2020-06-20 DIAGNOSIS — Z6838 Body mass index (BMI) 38.0-38.9, adult: Secondary | ICD-10-CM

## 2020-06-20 MED ORDER — METFORMIN HCL 500 MG PO TABS: 500.0000 mg | ORAL_TABLET | Freq: Every morning | ORAL | 0 refills | Status: DC

## 2020-06-20 MED ORDER — VITAMIN D (ERGOCALCIFEROL) 1.25 MG (50000 UNIT) PO CAPS: 50000.0000 [IU] | ORAL_CAPSULE | ORAL | 0 refills | Status: DC

## 2020-06-21 ENCOUNTER — Encounter (INDEPENDENT_AMBULATORY_CARE_PROVIDER_SITE_OTHER): Payer: Self-pay | Admitting: Bariatrics

## 2020-06-21 NOTE — Progress Notes (Signed)
Chief Complaint:   OBESITY Andrea Colon is here to discuss her progress with her obesity treatment plan along with follow-up of her obesity related diagnoses. Andrea Colon is on the Category 4 Plan and states she is following her eating plan approximately 50% of the time. Andrea Colon states she is swimming 30-40 minutes 3-4 times per week.  Today's visit was #: 4 Starting weight: 259 lbs Starting date: 04/26/2020 Today's weight: 246 lbs Today's date: 06/20/2020 Total lbs lost to date: 13 Total lbs lost since last in-office visit: 4  Interim History: Andrea Colon is down an additional 4 lbs. She has had a sinus infection and has felt bad. She states she struggles at dinner.  Subjective:   Prediabetes. Prosperity has a diagnosis of prediabetes based on her elevated HgA1c and was informed this puts her at greater risk of developing diabetes. She continues to work on diet and exercise to decrease her risk of diabetes. She denies nausea or hypoglycemia. No polyphagia.  Lab Results  Component Value Date   HGBA1C 6.3 02/08/2020   Lab Results  Component Value Date   INSULIN 35.1 (H) 04/26/2020   Vitamin D deficiency. No nausea, vomiting, or muscle weakness.    Ref. Range 04/26/2020 11:45  Vitamin D, 25-Hydroxy Latest Ref Range: 30.0 - 100.0 ng/mL 28.3 (L)   At risk for osteoporosis. Andrea Colon is at higher risk of osteopenia and osteoporosis due to Vitamin D deficiency.   Assessment/Plan:   Prediabetes. [name] will continue to work on weight loss, exercise, and decreasing simple carbohydrates to help decrease the risk of diabetes. Prescription was given for metFORMIN (GLUCOPHAGE) 500 MG tablet 1 PO daily in the a.m. #30 with 0 refills.  Vitamin D deficiency. Low Vitamin D level contributes to fatigue and are associated with obesity, breast, and colon cancer. She was given a prescription for Vitamin D, Ergocalciferol, (DRISDOL) 1.25 MG (50000 UNIT) CAPS capsule every week #4 with 0 refills and will  follow-up for routine testing of Vitamin D, at least 2-3 times per year to avoid over-replacement.   At risk for osteoporosis. Andrea Colon was given approximately 15 minutes of osteoporosis prevention counseling today. Andrea Colon is at risk for osteopenia and osteoporosis due to her Vitamin D deficiency. She was encouraged to take her Vitamin D and follow her higher calcium diet and increase strengthening exercise to help strengthen her bones and decrease her risk of osteopenia and osteoporosis.  Repetitive spaced learning was employed today to elicit superior memory formation and behavioral change.  Class 2 severe obesity with serious comorbidity and body mass index (BMI) of 38.0 to 38.9 in adult, unspecified obesity type ([place]).  Andrea Colon is currently in the action stage of change. As such, her goal is to continue with weight loss efforts. She has agreed to the Category 4 Plan.   She will work on meal planning, intentional eating, and increasing her water intake.   Exercise goals: Andrea Colon will continue swimming on a regular basis.  Behavioral modification strategies: increasing lean protein intake, decreasing simple carbohydrates, increasing vegetables, increasing water intake, decreasing eating out, no skipping meals, meal planning and cooking strategies, keeping healthy foods in the home and planning for success.  Andrea Colon has agreed to follow-up with our clinic in 2 weeks. She was informed of the importance of frequent follow-up visits to maximize her success with intensive lifestyle modifications for her multiple health conditions.   Objective:   Blood pressure 114/79, pulse 93, temperature 98.3 F (36.8 C), height 5\' 7"  (1.702  m), weight 246 lb (111.6 kg), SpO2 98 %. Body mass index is 38.53 kg/m.  General: Cooperative, alert, well developed, in no acute distress. HEENT: Conjunctivae and lids unremarkable. Cardiovascular: Regular rhythm.  Lungs: Normal work of breathing. Neurologic: No focal  deficits.   Lab Results  Component Value Date   CREATININE 0.92 02/08/2020   BUN 11 02/08/2020   NA 135 02/08/2020   K 3.8 02/08/2020   CL 99 02/08/2020   CO2 29 02/08/2020   Lab Results  Component Value Date   ALT 14 02/08/2020   AST 13 02/08/2020   ALKPHOS 41 02/08/2020   BILITOT 0.3 02/08/2020   Lab Results  Component Value Date   HGBA1C 6.3 02/08/2020   HGBA1C 6.4 (A) 10/01/2018   HGBA1C 6.1 04/09/2016   HGBA1C 5.5 05/14/2011   Lab Results  Component Value Date   INSULIN 35.1 (H) 04/26/2020   Lab Results  Component Value Date   TSH 1.760 04/26/2020   Lab Results  Component Value Date   CHOL 153 02/08/2020   HDL 32.40 (L) 02/08/2020   LDLCALC 61 10/01/2018   LDLDIRECT 79.0 02/08/2020   TRIG 312.0 (H) 02/08/2020   CHOLHDL 5 02/08/2020   Lab Results  Component Value Date   WBC 8.9 02/08/2020   HGB 11.4 (L) 02/08/2020   HCT 34.8 (L) 02/08/2020   MCV 77.2 (L) 02/08/2020   PLT 382.0 02/08/2020   Lab Results  Component Value Date   FERRITIN 24 01/21/2017   Attestation Statements:   Reviewed by clinician on day of visit: allergies, medications, problem list, medical history, surgical history, family history, social history, and previous encounter notes.  Migdalia Dk, am acting as Location manager for [employer], DO   I have reviewed the above documentation for accuracy and completeness, and I agree with the above. Jearld Lesch, DO

## 2020-07-04 ENCOUNTER — Ambulatory Visit (INDEPENDENT_AMBULATORY_CARE_PROVIDER_SITE_OTHER): Payer: 59 | Admitting: Family Medicine

## 2020-07-04 ENCOUNTER — Other Ambulatory Visit: Payer: Self-pay

## 2020-07-04 ENCOUNTER — Encounter (INDEPENDENT_AMBULATORY_CARE_PROVIDER_SITE_OTHER): Payer: Self-pay | Admitting: Family Medicine

## 2020-07-04 ENCOUNTER — Ambulatory Visit (INDEPENDENT_AMBULATORY_CARE_PROVIDER_SITE_OTHER): Payer: 59 | Admitting: Adult Health

## 2020-07-04 VITALS — BP 102/65 | HR 93 | Temp 98.0°F | Ht 67.0 in | Wt 247.0 lb

## 2020-07-04 DIAGNOSIS — R7303 Prediabetes: Secondary | ICD-10-CM

## 2020-07-04 DIAGNOSIS — Z6838 Body mass index (BMI) 38.0-38.9, adult: Secondary | ICD-10-CM

## 2020-07-04 DIAGNOSIS — E559 Vitamin D deficiency, unspecified: Secondary | ICD-10-CM | POA: Diagnosis not present

## 2020-07-04 DIAGNOSIS — Z9189 Other specified personal risk factors, not elsewhere classified: Secondary | ICD-10-CM | POA: Diagnosis not present

## 2020-07-04 DIAGNOSIS — I10 Essential (primary) hypertension: Secondary | ICD-10-CM | POA: Diagnosis not present

## 2020-07-04 MED ORDER — VITAMIN D (ERGOCALCIFEROL) 1.25 MG (50000 UNIT) PO CAPS: 50000.0000 [IU] | ORAL_CAPSULE | ORAL | 0 refills | Status: DC

## 2020-07-04 MED ORDER — METFORMIN HCL 500 MG PO TABS: 500.0000 mg | ORAL_TABLET | Freq: Every morning | ORAL | 0 refills | Status: DC

## 2020-07-04 MED ORDER — LOSARTAN POTASSIUM 100 MG PO TABS: 50.0000 mg | ORAL_TABLET | Freq: Every day | ORAL | 0 refills | Status: DC

## 2020-07-11 NOTE — Progress Notes (Signed)
Chief Complaint:   OBESITY Andrea Colon is here to discuss her progress with her obesity treatment plan along with follow-up of her obesity related diagnoses. Andrea Colon is on the Category 4 Plan and states she is following her eating plan approximately 30% of the time. Andrea Colon states she is swimming for 60 minutes 3 times per week.  Today's visit was #: 5 Starting weight: 259 lbs Starting date: 04/26/2020 Today's weight: 247 lbs Today's date: 07/04/2020 Total lbs lost to date: 12 Total lbs lost since last in-office visit: 0  Interim History: Andrea Colon voices she struggled the last weeks secondary to sinus infection and then stopped logging. She realizes she needs to improve her planning. Breakfast and lunch are normally easier to follow than dinner.  Subjective:   1. Vitamin D deficiency Andrea Colon denies nausea, vomiting, or muscle weakness, but she notes fatigue. She is on prescription Vit D.  2. Pre-diabetes Andrea Colon's A1c was 6.3 and insulin 35.1. She is on metformin and denies GI side effects.  3. Essential hypertension Andrea Colon's blood pressure is well controlled, and she denies chest pain, chest pressure, or headaches.  4. At risk for osteoporosis Andrea Colon is at higher risk of osteopenia and osteoporosis due to Vitamin D deficiency.   Assessment/Plan:   1. Vitamin D deficiency Low Vitamin D level contributes to fatigue and are associated with obesity, breast, and colon cancer. We will refill prescription Vitamin D for 1 month. Andrea Colon will follow-up for routine testing of Vitamin D, at least 2-3 times per year to avoid over-replacement.  - Vitamin D, Ergocalciferol, (DRISDOL) 1.25 MG (50000 UNIT) CAPS capsule; Take 1 capsule (50,000 Units total) by mouth every 7 (seven) days.  Dispense: 4 capsule; Refill: 0  2. Pre-diabetes Andrea Colon will continue to work on weight loss, exercise, and decreasing simple carbohydrates to help decrease the risk of diabetes. We will refill metformin for 1  month.  - metFORMIN (GLUCOPHAGE) 500 MG tablet; Take 1 tablet (500 mg total) by mouth every morning.  Dispense: 30 tablet; Refill: 0  3. Essential hypertension Andrea Colon is working on healthy weight loss and exercise to improve blood pressure control. We will watch for signs of hypotension as she continues her lifestyle modifications. Andrea Colon agreed to decrease losartan to 50 mg daily, she is to cut pill in half.  - losartan (COZAAR) 100 MG tablet; Take 0.5 tablets (50 mg total) by mouth daily.  Dispense: 30 tablet; Refill: 0  4. At risk for osteoporosis Andrea Colon was given approximately 15 minutes of osteoporosis prevention counseling today. Andrea Colon is at risk for osteopenia and osteoporosis due to her Vitamin D deficiency. She was encouraged to take her Vitamin D and follow her higher calcium diet and increase strengthening exercise to help strengthen her bones and decrease her risk of osteopenia and osteoporosis.  Repetitive spaced learning was employed today to elicit superior memory formation and behavioral change.  5. Class 2 severe obesity with serious comorbidity and body mass index (BMI) of 38.0 to 38.9 in adult, unspecified obesity type (H[facility]) Andrea Colon is currently in the action stage of change. As such, her goal is to continue with weight loss efforts. She has agreed to the Category 4 Plan.   Exercise goals: As is.  Behavioral modification strategies: increasing lean protein intake, meal planning and cooking strategies, keeping healthy foods in the home and planning for success.  Andrea Colon has agreed to follow-up with our clinic in 2 to 3 weeks. She was informed of the importance of frequent follow-up visits  to maximize her success with intensive lifestyle modifications for her multiple health conditions.   Objective:   Blood pressure 102/65, pulse 93, temperature 98 F (36.7 C), temperature source Oral, height 5\' 7"  (1.702 m), weight 247 lb (112 kg), last menstrual period 06/20/2020, SpO2 97  %. Body mass index is 38.69 kg/m.  General: Cooperative, alert, well developed, in no acute distress. HEENT: Conjunctivae and lids unremarkable. Cardiovascular: Regular rhythm.  Lungs: Normal work of breathing. Neurologic: No focal deficits.   Lab Results  Component Value Date   CREATININE 0.92 02/08/2020   BUN 11 02/08/2020   NA 135 02/08/2020   K 3.8 02/08/2020   CL 99 02/08/2020   CO2 29 02/08/2020   Lab Results  Component Value Date   ALT 14 02/08/2020   AST 13 02/08/2020   ALKPHOS 41 02/08/2020   BILITOT 0.3 02/08/2020   Lab Results  Component Value Date   HGBA1C 6.3 02/08/2020   HGBA1C 6.4 (A) 10/01/2018   HGBA1C 6.1 04/09/2016   HGBA1C 5.5 05/14/2011   Lab Results  Component Value Date   INSULIN 35.1 (H) 04/26/2020   Lab Results  Component Value Date   TSH 1.760 04/26/2020   Lab Results  Component Value Date   CHOL 153 02/08/2020   HDL 32.40 (L) 02/08/2020   LDLCALC 61 10/01/2018   LDLDIRECT 79.0 02/08/2020   TRIG 312.0 (H) 02/08/2020   CHOLHDL 5 02/08/2020   Lab Results  Component Value Date   WBC 8.9 02/08/2020   HGB 11.4 (L) 02/08/2020   HCT 34.8 (L) 02/08/2020   MCV 77.2 (L) 02/08/2020   PLT 382.0 02/08/2020   Lab Results  Component Value Date   FERRITIN 24 01/21/2017   Attestation Statements:   Reviewed by clinician on day of visit: allergies, medications, problem list, medical history, surgical history, family history, social history, and previous encounter notes.   I, Trixie Dredge, am acting as transcriptionist for Coralie Common, MD.  I have reviewed the above documentation for accuracy and completeness, and I agree with the above. - Jinny Blossom, MD

## 2020-07-25 ENCOUNTER — Ambulatory Visit (INDEPENDENT_AMBULATORY_CARE_PROVIDER_SITE_OTHER): Payer: 59 | Admitting: Bariatrics

## 2020-07-25 ENCOUNTER — Encounter (INDEPENDENT_AMBULATORY_CARE_PROVIDER_SITE_OTHER): Payer: Self-pay

## 2020-07-26 ENCOUNTER — Ambulatory Visit (INDEPENDENT_AMBULATORY_CARE_PROVIDER_SITE_OTHER): Payer: 59 | Admitting: Bariatrics

## 2020-07-26 ENCOUNTER — Encounter (INDEPENDENT_AMBULATORY_CARE_PROVIDER_SITE_OTHER): Payer: Self-pay | Admitting: Bariatrics

## 2020-07-26 ENCOUNTER — Other Ambulatory Visit: Payer: Self-pay

## 2020-07-26 VITALS — BP 113/70 | HR 85 | Temp 98.7°F | Ht 67.0 in | Wt 250.0 lb

## 2020-07-26 DIAGNOSIS — I10 Essential (primary) hypertension: Secondary | ICD-10-CM | POA: Diagnosis not present

## 2020-07-26 DIAGNOSIS — E66812 Obesity, class 2: Secondary | ICD-10-CM

## 2020-07-26 DIAGNOSIS — R7303 Prediabetes: Secondary | ICD-10-CM

## 2020-07-26 DIAGNOSIS — Z6839 Body mass index (BMI) 39.0-39.9, adult: Secondary | ICD-10-CM

## 2020-07-26 DIAGNOSIS — E559 Vitamin D deficiency, unspecified: Secondary | ICD-10-CM

## 2020-07-26 DIAGNOSIS — Z9189 Other specified personal risk factors, not elsewhere classified: Secondary | ICD-10-CM

## 2020-07-26 MED ORDER — METFORMIN HCL 500 MG PO TABS: 500.0000 mg | ORAL_TABLET | Freq: Every morning | ORAL | 0 refills | Status: DC

## 2020-07-27 NOTE — Progress Notes (Signed)
Chief Complaint:   OBESITY Andrea Colon is here to discuss her progress with her obesity treatment plan along with follow-up of her obesity related diagnoses. Andrea Colon is on the Category 4 Plan and states she is following her eating plan approximately 10% of the time. Andrea Colon states she is swimming 2-3 hours 1 time per week.  Today's visit was #: 6 Starting weight: 259 lbs Starting date: 04/26/2020 Today's weight: 250 lbs Today's date: 07/26/2020 Total lbs lost to date: 9 Total lbs lost since last in-office visit: 0  Interim History: Andrea Colon is up 3 lbs. She has been struggling with the plan. She reports doing okay with her water intake.   Subjective:   Vitamin D deficiency. Andrea Colon is taking high dose Vitamin D supplementation.    Ref. Range 04/26/2020 11:45  Vitamin D, 25-Hydroxy Latest Ref Range: 30.0 - 100.0 ng/mL 28.3 (L)   Prediabetes. Andrea Colon has a diagnosis of prediabetes based on her elevated HgA1c and was informed this puts her at greater risk of developing diabetes. She continues to work on diet and exercise to decrease her risk of diabetes. She denies nausea or hypoglycemia. Andrea Colon is taking metformin.  Lab Results  Component Value Date   HGBA1C 6.3 02/08/2020   Lab Results  Component Value Date   INSULIN 35.1 (H) 04/26/2020   Essential hypertension. Andrea Colon is taking Cozaar and Aldactone. She denies dizziness.  BP Readings from Last 3 Encounters:  07/26/20 113/70  07/04/20 102/65  06/20/20 114/79   Lab Results  Component Value Date   CREATININE 0.92 02/08/2020   CREATININE 0.8 10/01/2018   CREATININE 0.88 08/20/2016   At risk for diabetes mellitus. Andrea Colon is at higher than average risk for developing diabetes due to her obesity.   Assessment/Plan:   Vitamin D deficiency. Low Vitamin D level contributes to fatigue and are associated with obesity, breast, and colon cancer. She agrees to continue to take Vitamin D as directed and will follow-up for routine  testing of Vitamin D, at least 2-3 times per year to avoid over-replacement.  Prediabetes. Andrea Colon will continue to work on weight loss, exercise, and decreasing simple carbohydrates to help decrease the risk of diabetes. She will continue metFORMIN (GLUCOPHAGE) 500 MG tablet 1 PO daily #30 with 0 refills.  Essential hypertension. Andrea Colon is working on healthy weight loss and exercise to improve blood pressure control. We will watch for signs of hypotension as she continues her lifestyle modifications. She will continue her medications as directed.   At risk for diabetes mellitus. Andrea Colon was given approximately 15 minutes of diabetes education and counseling today. We discussed intensive lifestyle modifications today with an emphasis on weight loss as well as increasing exercise and decreasing simple carbohydrates in her diet. We also reviewed medication options with an emphasis on risk versus benefit of those discussed.   Repetitive spaced learning was employed today to elicit superior memory formation and behavioral change.  Class 2 severe obesity with serious comorbidity and body mass index (BMI) of 39.0 to 39.9 in adult, unspecified obesity type (Andrea Colon).  Andrea Colon is currently in the action stage of change. As such, her goal is to continue with weight loss efforts. She has agreed to the Category 4 Plan.   She will work on meal planning and intentional eating.   Handout was provided on [facility] [place].  Exercise goals: Andrea Colon will continue swimming and will increase walking if needed.  Behavioral modification strategies: increasing lean protein intake, decreasing simple carbohydrates, increasing  vegetables, increasing water intake, decreasing eating out, no skipping meals, meal planning and cooking strategies, keeping healthy foods in the home and planning for success.  Andrea Colon has agreed to follow-up with our clinic in 2 weeks. She was informed of the importance of frequent follow-up visits to  maximize her success with intensive lifestyle modifications for her multiple health conditions.   Objective:   Blood pressure 113/70, pulse 85, temperature 98.7 F (37.1 C), height 5\' 7"  (1.702 m), weight 250 lb (113.4 kg), last menstrual period 07/12/2020, SpO2 99 %. Body mass index is 39.16 kg/m.  General: Cooperative, alert, well developed, in no acute distress. HEENT: Conjunctivae and lids unremarkable. Cardiovascular: Regular rhythm.  Lungs: Normal work of breathing. Neurologic: No focal deficits.   Lab Results  Component Value Date   CREATININE 0.92 02/08/2020   BUN 11 02/08/2020   NA 135 02/08/2020   K 3.8 02/08/2020   CL 99 02/08/2020   CO2 29 02/08/2020   Lab Results  Component Value Date   ALT 14 02/08/2020   AST 13 02/08/2020   ALKPHOS 41 02/08/2020   BILITOT 0.3 02/08/2020   Lab Results  Component Value Date   HGBA1C 6.3 02/08/2020   HGBA1C 6.4 (A) 10/01/2018   HGBA1C 6.1 04/09/2016   HGBA1C 5.5 05/14/2011   Lab Results  Component Value Date   INSULIN 35.1 (H) 04/26/2020   Lab Results  Component Value Date   TSH 1.760 04/26/2020   Lab Results  Component Value Date   CHOL 153 02/08/2020   HDL 32.40 (L) 02/08/2020   LDLCALC 61 10/01/2018   LDLDIRECT 79.0 02/08/2020   TRIG 312.0 (H) 02/08/2020   CHOLHDL 5 02/08/2020   Lab Results  Component Value Date   WBC 8.9 02/08/2020   HGB 11.4 (L) 02/08/2020   HCT 34.8 (L) 02/08/2020   MCV 77.2 (L) 02/08/2020   PLT 382.0 02/08/2020   Lab Results  Component Value Date   FERRITIN 24 01/21/2017   Attestation Statements:   Reviewed by clinician on day of visit: allergies, medications, problem list, medical history, surgical history, family history, social history, and previous encounter notes.  Migdalia Dk, am acting as Location manager for [employer], DO   I have reviewed the above documentation for accuracy and completeness, and I agree with the above. Jearld Lesch, DO

## 2020-08-10 ENCOUNTER — Ambulatory Visit (INDEPENDENT_AMBULATORY_CARE_PROVIDER_SITE_OTHER): Payer: 59 | Admitting: Bariatrics

## 2020-08-24 ENCOUNTER — Ambulatory Visit (INDEPENDENT_AMBULATORY_CARE_PROVIDER_SITE_OTHER): Payer: 59 | Admitting: Bariatrics

## 2020-08-24 ENCOUNTER — Encounter (INDEPENDENT_AMBULATORY_CARE_PROVIDER_SITE_OTHER): Payer: Self-pay | Admitting: Bariatrics

## 2020-09-13 ENCOUNTER — Other Ambulatory Visit: Payer: Self-pay | Admitting: Family Medicine

## 2020-09-13 DIAGNOSIS — I10 Essential (primary) hypertension: Secondary | ICD-10-CM

## 2020-10-23 ENCOUNTER — Other Ambulatory Visit: Payer: Self-pay | Admitting: *Deleted

## 2020-10-23 DIAGNOSIS — I10 Essential (primary) hypertension: Secondary | ICD-10-CM

## 2020-10-23 MED ORDER — LOSARTAN POTASSIUM 100 MG PO TABS: 100.0000 mg | ORAL_TABLET | Freq: Every day | ORAL | 1 refills | Status: DC

## 2020-12-05 ENCOUNTER — Other Ambulatory Visit: Payer: Self-pay | Admitting: Family Medicine

## 2020-12-05 DIAGNOSIS — Z1231 Encounter for screening mammogram for malignant neoplasm of breast: Secondary | ICD-10-CM

## 2021-05-11 ENCOUNTER — Other Ambulatory Visit: Payer: Self-pay

## 2021-05-11 ENCOUNTER — Ambulatory Visit
Admission: RE | Admit: 2021-05-11 | Discharge: 2021-05-11 | Disposition: A | Discharge status: Home / Self Care | Payer: 59 | Source: Ambulatory Visit | Attending: Family Medicine | Admitting: Family Medicine

## 2021-05-11 DIAGNOSIS — Z1231 Encounter for screening mammogram for malignant neoplasm of breast: Secondary | ICD-10-CM

## 2021-05-14 ENCOUNTER — Other Ambulatory Visit: Payer: Self-pay | Admitting: *Deleted

## 2021-05-14 ENCOUNTER — Inpatient Hospital Stay
Admission: RE | Admit: 2021-05-14 | Discharge: 2021-05-14 | Disposition: A | Discharge status: Home / Self Care | Payer: Self-pay | Source: Ambulatory Visit | Attending: *Deleted | Admitting: *Deleted

## 2021-05-14 DIAGNOSIS — Z1231 Encounter for screening mammogram for malignant neoplasm of breast: Secondary | ICD-10-CM

## 2021-05-15 ENCOUNTER — Other Ambulatory Visit: Payer: Self-pay | Admitting: Family Medicine

## 2021-05-15 DIAGNOSIS — N6320 Unspecified lump in the left breast, unspecified quadrant: Secondary | ICD-10-CM

## 2021-05-15 DIAGNOSIS — R928 Other abnormal and inconclusive findings on diagnostic imaging of breast: Secondary | ICD-10-CM

## 2021-05-15 NOTE — Progress Notes (Signed)
Abnormal Mammogram.  Needs further imaging.  Will be contacted by  imaging office to schedule further imaging per result note.

## 2021-05-16 ENCOUNTER — Telehealth: Payer: Self-pay | Admitting: Family Medicine

## 2021-05-16 DIAGNOSIS — E559 Vitamin D deficiency, unspecified: Secondary | ICD-10-CM

## 2021-05-16 DIAGNOSIS — E781 Pure hyperglyceridemia: Secondary | ICD-10-CM

## 2021-05-16 DIAGNOSIS — R7303 Prediabetes: Secondary | ICD-10-CM

## 2021-05-16 NOTE — Telephone Encounter (Signed)
-----   Message from Cloyd Stagers, RT sent at 05/10/2021 10:15 AM EDT ----- Regarding: Lab Orders for Tuesday 5.31.2022 Please place lab orders for Tuesday 5.31.2022, office visit for physical on Tuesday 6.7.2022 Thank you, Dyke Maes RT(R)

## 2021-05-18 ENCOUNTER — Ambulatory Visit
Admission: RE | Admit: 2021-05-18 | Discharge: 2021-05-18 | Disposition: A | Discharge status: Home / Self Care | Payer: 59 | Source: Ambulatory Visit | Attending: Family Medicine | Admitting: Family Medicine

## 2021-05-18 ENCOUNTER — Other Ambulatory Visit: Payer: Self-pay

## 2021-05-18 DIAGNOSIS — R928 Other abnormal and inconclusive findings on diagnostic imaging of breast: Secondary | ICD-10-CM | POA: Insufficient documentation

## 2021-05-18 DIAGNOSIS — N6320 Unspecified lump in the left breast, unspecified quadrant: Secondary | ICD-10-CM

## 2021-05-22 ENCOUNTER — Other Ambulatory Visit: Payer: Self-pay

## 2021-05-22 ENCOUNTER — Other Ambulatory Visit (INDEPENDENT_AMBULATORY_CARE_PROVIDER_SITE_OTHER): Payer: 59

## 2021-05-22 DIAGNOSIS — R7303 Prediabetes: Secondary | ICD-10-CM | POA: Diagnosis not present

## 2021-05-22 DIAGNOSIS — E781 Pure hyperglyceridemia: Secondary | ICD-10-CM

## 2021-05-22 DIAGNOSIS — E559 Vitamin D deficiency, unspecified: Secondary | ICD-10-CM

## 2021-05-22 LAB — LIPID PANEL
Cholesterol: 137 mg/dL (ref 0–200)
HDL: 31.7 mg/dL — ABNORMAL LOW (ref >39.00)
NonHDL: 105.42
Total CHOL/HDL Ratio: 4
Triglycerides: 222 mg/dL — ABNORMAL HIGH (ref 0.0–149.0)
VLDL: 44.4 mg/dL — ABNORMAL HIGH (ref 0.0–40.0)

## 2021-05-22 LAB — COMPREHENSIVE METABOLIC PANEL
ALT: 15 U/L (ref 0–35)
AST: 14 U/L (ref 0–37)
Albumin: 3.9 g/dL (ref 3.5–5.2)
Alkaline Phosphatase: 37 U/L — ABNORMAL LOW (ref 39–117)
BUN: 10 mg/dL (ref 6–23)
CO2: 28 mEq/L (ref 19–32)
Calcium: 9.1 mg/dL (ref 8.4–10.5)
Chloride: 99 mEq/L (ref 96–112)
Creatinine, Ser: 0.8 mg/dL (ref 0.40–1.20)
GFR: 88.85 mL/min (ref >60.00)
Glucose, Bld: 136 mg/dL — ABNORMAL HIGH (ref 70–99)
Potassium: 3.8 mEq/L (ref 3.5–5.1)
Sodium: 136 mEq/L (ref 135–145)
Total Bilirubin: 0.4 mg/dL (ref 0.2–1.2)
Total Protein: 7.2 g/dL (ref 6.0–8.3)

## 2021-05-22 LAB — HEMOGLOBIN A1C: Hgb A1c MFr Bld: 6.1 % (ref 4.6–6.5)

## 2021-05-22 LAB — LDL CHOLESTEROL, DIRECT: Direct LDL: 81 mg/dL

## 2021-05-22 LAB — VITAMIN D 25 HYDROXY (VIT D DEFICIENCY, FRACTURES): VITD: 23.88 ng/mL — ABNORMAL LOW (ref 30.00–100.00)

## 2021-05-22 NOTE — Progress Notes (Signed)
No critical labs need to be addressed urgently. We will discuss labs in detail at upcoming office visit.   

## 2021-05-29 ENCOUNTER — Other Ambulatory Visit: Payer: Self-pay

## 2021-05-29 ENCOUNTER — Ambulatory Visit (INDEPENDENT_AMBULATORY_CARE_PROVIDER_SITE_OTHER): Payer: 59 | Admitting: Family Medicine

## 2021-05-29 ENCOUNTER — Encounter: Payer: Self-pay | Admitting: Family Medicine

## 2021-05-29 VITALS — BP 100/66 | HR 99 | Temp 97.4°F | Ht 64.0 in | Wt 272.5 lb

## 2021-05-29 DIAGNOSIS — E781 Pure hyperglyceridemia: Secondary | ICD-10-CM

## 2021-05-29 DIAGNOSIS — F330 Major depressive disorder, recurrent, mild: Secondary | ICD-10-CM

## 2021-05-29 DIAGNOSIS — Z1211 Encounter for screening for malignant neoplasm of colon: Secondary | ICD-10-CM

## 2021-05-29 DIAGNOSIS — I10 Essential (primary) hypertension: Secondary | ICD-10-CM

## 2021-05-29 DIAGNOSIS — E559 Vitamin D deficiency, unspecified: Secondary | ICD-10-CM

## 2021-05-29 DIAGNOSIS — Z0000 Encounter for general adult medical examination without abnormal findings: Secondary | ICD-10-CM | POA: Diagnosis not present

## 2021-05-29 DIAGNOSIS — Z6841 Body Mass Index (BMI) 40.0 and over, adult: Secondary | ICD-10-CM

## 2021-05-29 DIAGNOSIS — R7303 Prediabetes: Secondary | ICD-10-CM

## 2021-05-29 MED ORDER — VITAMIN D (ERGOCALCIFEROL) 1.25 MG (50000 UNIT) PO CAPS: 50000.0000 [IU] | ORAL_CAPSULE | ORAL | 0 refills | Status: DC

## 2021-05-29 NOTE — Assessment & Plan Note (Signed)
Improving Reviewed dietary changes and lifestyle changes to lower further.

## 2021-05-29 NOTE — Assessment & Plan Note (Signed)
Restart vit d supplement.

## 2021-05-29 NOTE — Assessment & Plan Note (Addendum)
Encouraged exercise, weight loss, healthy eating habits.  [facility]

## 2021-05-29 NOTE — Assessment & Plan Note (Signed)
Stable, chronic.  Continue current medication.   ON 1/2 dose of losartan, conitnue spirnolactone.

## 2021-05-29 NOTE — Progress Notes (Signed)
Patient ID: Andrea Colon, female    DOB: [birth year], 45 y.o.   MRN: [id number]  This visit was conducted in person.  BP 100/66   Pulse 99   Temp (!) 97.4 F (36.3 C) (Temporal)   Ht 5\' 4"  (1.626 m)   Wt 272 lb 8 oz (123.6 kg)   LMP 05/24/2021   SpO2 98%   BMI 46.77 kg/m    CC:  Chief Complaint  Patient presents with  . Annual Exam    Subjective:   HPI: Andrea Colon is a 45 y.o. female presenting on 05/29/2021 for Annual Exam  MDD moderate control on wellbutrin XL 150mg  daily, clonazepam 0.5 mg. Stopped the prozac .Marland Kitchen helped her feel better, less dizziness , fatigue and nausea. PHQ9: 3  Morbid obesity: currently followed at [facility]  Wt Readings from Last 3 Encounters:  05/29/21 272 lb 8 oz (123.6 kg)  07/26/20 250 lb (113.4 kg)  07/04/20 247 lb (112 kg)   Hypertension:  Good control on spironolactone and Losartan .Marland Kitchen now taking 50 mg daily. BP Readings from Last 3 Encounters:  05/29/21 100/66  07/26/20 113/70  07/04/20 102/65  Using medication without problems or lightheadedness:  none Chest pain with exertion:none Edema:none Short of breath: none Average home BPs:120/80 Other issues:  Prediabetes  Lab Results  Component Value Date   HGBA1C 6.1 05/22/2021   The 10-year ASCVD risk score Mikey Bussing DC Jr., et al., 2013) is: 0.8%   Values used to calculate the score:     Age: 44 years     Sex: Female     Is Non-Hispanic African American: No     Diabetic: No     Tobacco smoker: No     Systolic Blood Pressure: 784 mmHg     Is BP treated: Yes     HDL Cholesterol: 31.7 mg/dL     Total Cholesterol: 137 mg/dL  Lab Results  Component Value Date   CHOL 137 05/22/2021   HDL 31.70 (L) 05/22/2021   LDLCALC 61 10/01/2018   LDLDIRECT 81.0 05/22/2021   TRIG 222.0 (H) 05/22/2021   CHOLHDL 4 05/22/2021   Diet: Exercise:  Vit D low again off weekly supplement.Marland Kitchen only taking vit d 2000 IU daily     Relevant past medical, surgical, family  and social history reviewed and updated as indicated. Interim medical history since our last visit reviewed. Allergies and medications reviewed and updated. Outpatient Medications Prior to Visit  Medication Sig Dispense Refill  . buPROPion (WELLBUTRIN XL) 300 MG 24 hr tablet Take 300 mg by mouth daily.    . clonazePAM (KLONOPIN) 0.5 MG tablet Take 1 tablet (0.5 mg total) by mouth at bedtime. 30 tablet 0  . famotidine (PEPCID) 20 MG tablet Take 20 mg by mouth daily.     Marland Kitchen losartan (COZAAR) 100 MG tablet Take 1 tablet (100 mg total) by mouth daily. 90 tablet 1  . PARAGARD INTRAUTERINE COPPER IU 1 Device by Intrauterine route once.     Marland Kitchen spironolactone (ALDACTONE) 50 MG tablet Take 50 mg by mouth 2 (two) times daily.     . vitamin B-12 (CYANOCOBALAMIN) 1000 MCG tablet Take 1,000 mcg by mouth daily.    Marland Kitchen FLUoxetine (PROZAC) 20 MG tablet Take 20 mg by mouth daily.    . metFORMIN (GLUCOPHAGE) 500 MG tablet Take 1 tablet (500 mg total) by mouth every morning. 30 tablet 0  . Omega-3 Fatty Acids (FISH OIL) 1000 MG CAPS Take  2 capsules by mouth daily.    . Vitamin D, Ergocalciferol, (DRISDOL) 1.25 MG (50000 UNIT) CAPS capsule Take 1 capsule (50,000 Units total) by mouth every 7 (seven) days. 4 capsule 0   No facility-administered medications prior to visit.     Per HPI unless specifically indicated in ROS section below Review of Systems  Constitutional: Negative for fatigue and fever.  HENT: Negative for congestion.   Eyes: Negative for pain.  Respiratory: Negative for cough and shortness of breath.   Cardiovascular: Negative for chest pain, palpitations and leg swelling.  Gastrointestinal: Negative for abdominal pain.  Genitourinary: Negative for dysuria and vaginal bleeding.  Musculoskeletal: Negative for back pain.  Neurological: Negative for syncope, light-headedness and headaches.  Psychiatric/Behavioral: Negative for dysphoric mood.   Objective:  BP 100/66   Pulse 99   Temp (!) 97.4 F  (36.3 C) (Temporal)   Ht 5\' 4"  (1.626 m)   Wt 272 lb 8 oz (123.6 kg)   LMP 05/24/2021   SpO2 98%   BMI 46.77 kg/m   Wt Readings from Last 3 Encounters:  05/29/21 272 lb 8 oz (123.6 kg)  07/26/20 250 lb (113.4 kg)  07/04/20 247 lb (112 kg)      Physical Exam Constitutional:      General: She is not in acute distress.    Appearance: Normal appearance. She is well-developed. She is not ill-appearing or toxic-appearing.  HENT:     Head: Normocephalic.     Right Ear: Hearing, tympanic membrane, ear canal and external ear normal. Tympanic membrane is not erythematous, retracted or bulging.     Left Ear: Hearing, tympanic membrane, ear canal and external ear normal. Tympanic membrane is not erythematous, retracted or bulging.     Nose: No mucosal edema or rhinorrhea.     Right Sinus: No maxillary sinus tenderness or frontal sinus tenderness.     Left Sinus: No maxillary sinus tenderness or frontal sinus tenderness.     Mouth/Throat:     Pharynx: Uvula midline.  Eyes:     General: Lids are normal. Lids are everted, no foreign bodies appreciated.     Conjunctiva/sclera: Conjunctivae normal.     Pupils: Pupils are equal, round, and reactive to light.  Neck:     Thyroid: No thyroid mass or thyromegaly.     Vascular: No carotid bruit.     Trachea: Trachea normal.  Cardiovascular:     Rate and Rhythm: Normal rate and regular rhythm.     Pulses: Normal pulses.     Heart sounds: Normal heart sounds, S1 normal and S2 normal. No murmur heard. No friction rub. No gallop.   Pulmonary:     Effort: Pulmonary effort is normal. No tachypnea or respiratory distress.     Breath sounds: Normal breath sounds. No decreased breath sounds, wheezing, rhonchi or rales.  Abdominal:     General: Bowel sounds are normal.     Palpations: Abdomen is soft.     Tenderness: There is no abdominal tenderness.  Musculoskeletal:     Cervical back: Normal range of motion and neck supple.  Skin:    General: Skin  is warm and dry.     Findings: No rash.  Neurological:     Mental Status: She is alert.  Psychiatric:        Mood and Affect: Mood is not anxious or depressed.        Speech: Speech normal.        Behavior: Behavior normal. Behavior  is cooperative.        Thought Content: Thought content normal.        Judgment: Judgment normal.       Results for orders placed or performed in visit on 05/22/21  VITAMIN D 25 Hydroxy (Vit-D Deficiency, Fractures)  Result Value Ref Range   VITD 23.88 (L) 30.00 - 100.00 ng/mL  Lipid panel  Result Value Ref Range   Cholesterol 137 0 - 200 mg/dL   Triglycerides 222.0 (H) 0.0 - 149.0 mg/dL   HDL 31.70 (L) >39.00 mg/dL   VLDL 44.4 (H) 0.0 - 40.0 mg/dL   Total CHOL/HDL Ratio 4    NonHDL 105.42   Comprehensive metabolic panel  Result Value Ref Range   Sodium 136 135 - 145 mEq/L   Potassium 3.8 3.5 - 5.1 mEq/L   Chloride 99 96 - 112 mEq/L   CO2 28 19 - 32 mEq/L   Glucose, Bld 136 (H) 70 - 99 mg/dL   BUN 10 6 - 23 mg/dL   Creatinine, Ser 0.80 0.40 - 1.20 mg/dL   Total Bilirubin 0.4 0.2 - 1.2 mg/dL   Alkaline Phosphatase 37 (L) 39 - 117 U/L   AST 14 0 - 37 U/L   ALT 15 0 - 35 U/L   Total Protein 7.2 6.0 - 8.3 g/dL   Albumin 3.9 3.5 - 5.2 g/dL   GFR 88.85 >60.00 mL/min   Calcium 9.1 8.4 - 10.5 mg/dL  Hemoglobin A1c  Result Value Ref Range   Hgb A1c MFr Bld 6.1 4.6 - 6.5 %  LDL cholesterol, direct  Result Value Ref Range   Direct LDL 81.0 mg/dL    This visit occurred during the SARS-CoV-2 public health emergency.  Safety protocols were in place, including screening questions prior to the visit, additional usage of staff PPE, and extensive cleaning of exam room while observing appropriate contact time as indicated for disinfecting solutions.   COVID 19 screen:  No recent travel or known exposure to COVID19 The patient denies respiratory symptoms of COVID 19 at this time. The importance of social distancing was discussed today.   Assessment and  Plan The patient's preventative maintenance and recommended screening tests for an annual wellness exam were reviewed in full today. Brought up to date unless services declined.  Counselled on the importance of diet, exercise, and its role in overall health and mortality. The patient's FH and SH was reviewed, including their home life, tobacco status, and drug and alcohol status.   Vaccines: Last Tdap in 2013, COVID x 2 STD screen: refused Colon: no early family history... due to start screening now age 85 Mammogram:05/11/21 repeat low Birads PAP/DVE: nml pap, HPV neg GYN9/2019... has appt later this week. No ETOH, no smoking  hep C due   Problem List Items Addressed This Visit    Depression, major, recurrent, mild ([place])     Followod by psychiatry... seems to be doing better on wellbutrin alone at lower dose. SE to SSRIs.      Essential hypertension, benign    Stable, chronic.  Continue current medication.   ON 1/2 dose of losartan, conitnue spirnolactone.      High triglycerides    Improving Reviewed dietary changes and lifestyle changes to lower further.      Morbid obesity ([place])    Encouraged exercise, weight loss, healthy eating habits.       Prediabetes   Vitamin D insufficiency    Restart vit d supplement.  Other Visit Diagnoses    Routine general medical examination at a health care facility    -  Primary   Vitamin D deficiency       Relevant Medications   Vitamin D, Ergocalciferol, (DRISDOL) 1.25 MG (50000 UNIT) CAPS capsule   Colon cancer screening       Relevant Orders   Ambulatory referral to Gastroenterology   Essential hypertension          Eliezer Lofts, MD

## 2021-05-29 NOTE — Patient Instructions (Addendum)
Keep working on healthy eating and low carb diet.  I think restarting at [facility]   GI office will to set up colonoscopy.

## 2021-05-29 NOTE — Assessment & Plan Note (Signed)
[facility] by psychiatry... seems to be doing better on wellbutrin alone at lower dose. SE to SSRIs.

## 2021-05-31 ENCOUNTER — Ambulatory Visit (INDEPENDENT_AMBULATORY_CARE_PROVIDER_SITE_OTHER): Payer: 59 | Admitting: Family Medicine

## 2021-05-31 ENCOUNTER — Encounter (INDEPENDENT_AMBULATORY_CARE_PROVIDER_SITE_OTHER): Payer: Self-pay | Admitting: Family Medicine

## 2021-05-31 ENCOUNTER — Other Ambulatory Visit: Payer: Self-pay

## 2021-05-31 VITALS — BP 106/70 | HR 99 | Temp 97.9°F | Ht 64.0 in | Wt 269.0 lb

## 2021-05-31 DIAGNOSIS — Z1331 Encounter for screening for depression: Secondary | ICD-10-CM

## 2021-05-31 DIAGNOSIS — E559 Vitamin D deficiency, unspecified: Secondary | ICD-10-CM

## 2021-05-31 DIAGNOSIS — F330 Major depressive disorder, recurrent, mild: Secondary | ICD-10-CM

## 2021-05-31 DIAGNOSIS — R5383 Other fatigue: Secondary | ICD-10-CM

## 2021-05-31 DIAGNOSIS — D508 Other iron deficiency anemias: Secondary | ICD-10-CM

## 2021-05-31 DIAGNOSIS — R7303 Prediabetes: Secondary | ICD-10-CM

## 2021-05-31 DIAGNOSIS — I10 Essential (primary) hypertension: Secondary | ICD-10-CM

## 2021-05-31 DIAGNOSIS — E781 Pure hyperglyceridemia: Secondary | ICD-10-CM

## 2021-05-31 DIAGNOSIS — R0602 Shortness of breath: Secondary | ICD-10-CM

## 2021-05-31 DIAGNOSIS — Z0289 Encounter for other administrative examinations: Secondary | ICD-10-CM

## 2021-05-31 DIAGNOSIS — Z6841 Body Mass Index (BMI) 40.0 and over, adult: Secondary | ICD-10-CM

## 2021-06-01 ENCOUNTER — Ambulatory Visit: Payer: 59 | Admitting: Obstetrics and Gynecology

## 2021-06-01 LAB — FOLATE: Folate: 7.7 ng/mL (ref >3.0)

## 2021-06-01 LAB — VITAMIN B12: Vitamin B-12: 690 pg/mL (ref 232–1245)

## 2021-06-01 LAB — CBC WITH DIFFERENTIAL/PLATELET
Basophils Absolute: 0.1 10*3/uL (ref 0.0–0.2)
Basos: 1 %
EOS (ABSOLUTE): 0.2 10*3/uL (ref 0.0–0.4)
Eos: 3 %
Hematocrit: 37.4 % (ref 34.0–46.6)
Hemoglobin: 12.5 g/dL (ref 11.1–15.9)
Immature Grans (Abs): 0 10*3/uL (ref 0.0–0.1)
Immature Granulocytes: 0 %
Lymphocytes Absolute: 2 10*3/uL (ref 0.7–3.1)
Lymphs: 22 %
MCH: 28.3 pg (ref 26.6–33.0)
MCHC: 33.4 g/dL (ref 31.5–35.7)
MCV: 85 fL (ref 79–97)
Monocytes Absolute: 0.5 10*3/uL (ref 0.1–0.9)
Monocytes: 5 %
Neutrophils Absolute: 6.3 10*3/uL (ref 1.4–7.0)
Neutrophils: 69 %
Platelets: 335 10*3/uL (ref 150–450)
RBC: 4.42 x10E6/uL (ref 3.77–5.28)
RDW: 14.6 % (ref 11.7–15.4)
WBC: 9 10*3/uL (ref 3.4–10.8)

## 2021-06-01 LAB — TSH: TSH: 2.44 u[IU]/mL (ref 0.450–4.500)

## 2021-06-01 LAB — INSULIN, RANDOM: INSULIN: 56.1 u[IU]/mL — ABNORMAL HIGH (ref 2.6–24.9)

## 2021-06-01 LAB — T4: T4, Total: 8.3 ug/dL (ref 4.5–12.0)

## 2021-06-01 LAB — T3: T3, Total: 175 ng/dL (ref 71–180)

## 2021-06-06 ENCOUNTER — Encounter (INDEPENDENT_AMBULATORY_CARE_PROVIDER_SITE_OTHER): Payer: Self-pay | Admitting: Family Medicine

## 2021-06-07 NOTE — Progress Notes (Addendum)
Chief Complaint:   OBESITY Andrea Colon (MR# [id number]) is a 45 y.o. female who presents for evaluation and treatment of obesity and related comorbidities. Current BMI is Body mass index is 46.17 kg/m. Andrea Colon has been struggling with her weight for many years and has been unsuccessful in either losing weight, maintaining weight loss, or reaching her healthy weight goal.  Andrea Colon is currently in the action stage of change and ready to dedicate time achieving and maintaining a healthier weight. Andrea Colon is interested in becoming our patient and working on intensive lifestyle modifications including (but not limited to) diet and exercise for weight loss.  Andrea Colon is a returning pt. Recent gain of 13 lbs. Eating out for lunch at work. AM coffee with flavored creamer 1 tbsp, blueberry bagel + cream cheese (feel satisfied); 11:30-12 PM hungry- pimento cheese sandwich (2 tbsp) + 1 bag baked chips (handful) + Sprite (satisfied); After work (may have snack prior) [employer] meals- tacos- chicken + pineapple with onion + green peppers; After dinner snack- cinnamon roll (pre-made- 2) or donuts ([employer]ris[employer]reme 2-3).  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  Depression Screen Andrea Colon's Food and Mood (modified PHQ-9) score was 19.  Depression screen PHQ 2/9 05/31/2021  Decreased Interest 3  Down, Depressed, Hopeless 3  PHQ - 2  Score 6  Altered sleeping 2  Tired, decreased energy 3  Change in appetite 3  Feeling bad or failure about yourself  2  Trouble concentrating 2  Moving slowly or fidgety/restless 0  Suicidal thoughts 1  PHQ-9 Score 19  Difficult doing work/chores Somewhat difficult   Subjective:   1. Other fatigue Andrea Colon admits to daytime somnolence and admits to waking up still tired. Patent has a history of symptoms of daytime fatigue, morning fatigue, and morning headache. Andrea Colon generally gets  4-6  hours of sleep per night, and states that she has poor sleep quality. Snoring is present. Apneic episodes are not present. Epworth Sleepiness Score is 5. EKG normal sinus rhythm at 96 bpm.  2. Shortness of breath on exertion Andrea Colon notes increasing shortness of breath with exercising and seems to be worsening over time with weight gain. She notes getting out of breath sooner with activity than she used to. This has gotten worse recently. Andrea Colon denies shortness of breath at rest or orthopnea. EKG normal sinus rhythm at 96 bpm.  3. Essential hypertension, benign Andrea Colon's BP is controlled today. She is on losartan 50 mg. Pt denies chest pain/chest pressure/headache.  4. Depression, major, recurrent, mild ([facility]) Andrea Colon recently stopped Prozac and decreased Wellbutrin. Her anxiety is ok- at baseline.  5. Vitamin D insufficiency Andrea Colon is on prescription Vit D. Her previous it D level was 23.80.  6. Pre-diabetes Andrea Colon's last A1c was 6.1 and insulin level 35.1. She is not on medication.  7. High triglycerides Andrea Colon's last triglyceride level was 222. She is not on statin therapy.  8. Other iron  deficiency anemia Andrea Colon's last CBC was over a year ago. She is taking PO iron now. She reports fatigue.  Assessment/Plan:   1. Other fatigue Andrea Colon does feel that her weight is causing her energy to be lower than it should be. Fatigue may be related to obesity, depression or many other causes. Labs will be  ordered, and in the meanwhile, Andrea Colon will focus on self care including making healthy food choices, increasing physical activity and focusing on stress reduction.  - T3 - T4 - TSH - EKG 12-Lead  2. Shortness of breath on exertion Andrea Colon does feel that she gets out of breath more easily that she used to when she exercises. Andrea Colon's shortness of breath appears to be obesity related and exercise induced. She has agreed to work on weight loss and gradually increase exercise to treat her exercise induced shortness of breath. Will continue to monitor closely.  3. Essential hypertension, benign Andrea Colon is working on healthy weight loss and exercise to improve blood pressure control. We will watch for signs of hypotension as she continues her lifestyle modifications.  4. Depression, major, recurrent, mild ([place]) Behavior modification techniques were discussed today to help Andrea Colon deal with her emotional/non-hunger eating behaviors.  Orders and follow up as documented in patient record. Follow up at next appt. Refer to Dr. Mallie Mussel.  5. Vitamin D insufficiency Low Vitamin D level contributes to fatigue and are associated with obesity, breast, and colon cancer. She agrees to continue to take prescription Vitamin D @50 ,000 IU every week and will follow-up for routine testing of Vitamin D, at least 2-3 times per year to avoid over-replacement.  6. Pre-diabetes Andrea Colon will continue to work on weight loss, exercise, and decreasing simple carbohydrates to help decrease the risk of diabetes.   - Insulin, random  7. High triglycerides Cardiovascular risk and specific lipid/LDL goals reviewed.  We discussed several lifestyle modifications today and Andrea Colon will continue to work on diet, exercise and weight loss efforts. Orders and follow up as documented in patient record. Follow up labs in 3 months.  Counseling Intensive lifestyle modifications are the first line treatment for this issue. Dietary changes:  Increase soluble fiber. Decrease simple carbohydrates. Exercise changes: Moderate to vigorous-intensity aerobic activity 150 minutes per week if tolerated. Lipid-lowering medications: see documented in medical record.  8. Other iron deficiency anemia Check labs today.  - Vitamin B12 - CBC with Differential/Platelet - Folate  9. Screening for depression Andrea Colon had a positive depression screening. Depression is commonly associated with obesity and often results in emotional eating behaviors. We will monitor this closely and work on CBT to help improve the non-hunger eating patterns. Referral to Psychology may be required if no improvement is seen as she continues in our clinic.  10. Class 3 severe obesity with serious comorbidity and body mass index (BMI) of 40.0 to 44.9 in adult, unspecified obesity type (HCC)  Andrea Colon is currently in the action stage of change and her goal is to continue with weight loss efforts. I recommend Andrea Colon begin the structured treatment plan as follows:  She has agreed to the Category 4 Plan and keeping a food journal and adhering to recommended goals of 1750-1900 calories and 125+ g protein.  Exercise goals: No exercise has been prescribed at this time.   Behavioral modification strategies: increasing lean protein intake, meal planning and cooking strategies, and keeping healthy foods in the home.  She was informed of the importance of frequent follow-up visits to maximize her success with intensive  lifestyle modifications for her multiple health conditions. She was informed we would discuss her lab results at her next visit unless there is a critical issue that needs to be addressed sooner. Andrea Colon agreed to keep her next visit at the agreed upon time to discuss these results.  Objective:   Blood pressure 106/70, pulse 99, temperature 97.9 F (36.6 C), height 5\' 4"  (1.626 m), weight 269 lb (122 kg), last menstrual period 05/24/2021, SpO2 97 %. Body mass index is  46.17 kg/m.  EKG: Normal sinus rhythm, rate 96.  Indirect Calorimeter completed today shows a VO2 of 425 and a REE of 2961.  Her calculated basal metabolic rate is 2956 thus her basal metabolic rate is better than expected.  General: Cooperative, alert, well developed, in no acute distress. HEENT: Conjunctivae and lids unremarkable. Cardiovascular: Regular rhythm.  Lungs: Normal work of breathing. Neurologic: No focal deficits.   Lab Results  Component Value Date   CREATININE 0.80 05/22/2021   BUN 10 05/22/2021   NA 136 05/22/2021   K 3.8 05/22/2021   CL 99 05/22/2021   CO2 28 05/22/2021   Lab Results  Component Value Date   ALT 15 05/22/2021   AST 14 05/22/2021   ALKPHOS 37 (L) 05/22/2021   BILITOT 0.4 05/22/2021   Lab Results  Component Value Date   HGBA1C 6.1 05/22/2021   HGBA1C 6.3 02/08/2020   HGBA1C 6.4 (A) 10/01/2018   HGBA1C 6.1 04/09/2016   HGBA1C 5.5 05/14/2011   Lab Results  Component Value Date   INSULIN 56.1 (H) 05/31/2021   INSULIN 35.1 (H) 04/26/2020   Lab Results  Component Value Date   TSH 2.440 05/31/2021   Lab Results  Component Value Date   CHOL 137 05/22/2021   HDL 31.70 (L) 05/22/2021   LDLCALC 61 10/01/2018   LDLDIRECT 81.0 05/22/2021   TRIG 222.0 (H) 05/22/2021   CHOLHDL 4 05/22/2021   Lab Results  Component Value Date   WBC 9.0 05/31/2021   HGB 12.5 05/31/2021   HCT 37.4 05/31/2021   MCV 85 05/31/2021   PLT 335 05/31/2021   Lab Results  Component Value Date   FERRITIN 24 01/21/2017    Attestation Statements:   Reviewed by clinician on day of visit: allergies, medications, problem list, medical history, surgical history, family history, social history, and previous encounter notes.  Time spent on visit including pre-visit chart review and post-visit charting and care was 60 minutes.   Coral Ceo, CMA, am acting as transcriptionist for Coralie Common, MD.  This is the patient's first visit at [facility]. The patient's NEW PATIENT PACKET was reviewed at length. Included in the packet: current and past health history, medications, allergies, ROS, gynecologic history (women only), surgical history, family history, social history, weight history, weight loss surgery history (for those that have had weight loss surgery), nutritional evaluation, mood and food questionnaire, PHQ9, Epworth questionnaire, sleep habits questionnaire, patient life and health improvement goals questionnaire. These will all be scanned into the patient's chart under media.   During the visit, I independently reviewed the patient's EKG, bioimpedance scale results, and indirect calorimeter results. I used this information to tailor a meal plan for the patient that will help her to lose weight and will improve her obesity-related conditions going forward. I performed a medically necessary appropriate examination and/or evaluation. I discussed the assessment and treatment plan with the patient. The patient was provided an opportunity to ask questions and all were answered. The  patient agreed with the plan and demonstrated an understanding of the instructions. Labs were ordered at this visit and will be reviewed at the next visit unless more critical results need to be addressed immediately. Clinical information was updated and documented in the EMR.   Time spent on visit including pre-visit chart review and post-visit care was 60 minutes.   A separate 15 minutes was spent on risk counseling (see above).   I have reviewed the above documentation for accuracy and completeness, and I agree with the above. - Jinny Blossom, MD

## 2021-06-14 ENCOUNTER — Other Ambulatory Visit: Payer: Self-pay

## 2021-06-14 ENCOUNTER — Ambulatory Visit (INDEPENDENT_AMBULATORY_CARE_PROVIDER_SITE_OTHER): Payer: 59 | Admitting: Family Medicine

## 2021-06-14 ENCOUNTER — Encounter (INDEPENDENT_AMBULATORY_CARE_PROVIDER_SITE_OTHER): Payer: Self-pay | Admitting: Family Medicine

## 2021-06-14 VITALS — BP 129/81 | HR 83 | Temp 98.1°F | Ht 64.0 in | Wt 265.0 lb

## 2021-06-14 DIAGNOSIS — R7303 Prediabetes: Secondary | ICD-10-CM | POA: Diagnosis not present

## 2021-06-14 DIAGNOSIS — E559 Vitamin D deficiency, unspecified: Secondary | ICD-10-CM | POA: Diagnosis not present

## 2021-06-14 DIAGNOSIS — I10 Essential (primary) hypertension: Secondary | ICD-10-CM | POA: Diagnosis not present

## 2021-06-14 DIAGNOSIS — E781 Pure hyperglyceridemia: Secondary | ICD-10-CM

## 2021-06-14 DIAGNOSIS — Z6841 Body Mass Index (BMI) 40.0 and over, adult: Secondary | ICD-10-CM

## 2021-06-19 NOTE — Progress Notes (Signed)
Chief Complaint:   OBESITY Andrea Colon is here to discuss her progress with her obesity treatment plan along with follow-up of her obesity related diagnoses. Andrea Colon is on the Category 4 Plan and keeping a food journal and adhering to recommended goals of 1750-1900 calories and 125+ g protein and states she is following her eating plan approximately 100% of the time. Andrea Colon states she is not currently exercising.  Today's visit was #: 2 Starting weight: 269 lbs Starting date: 05/31/2021 Today's weight: 265 lbs Today's date: 06/14/2021 Total lbs lost to date: 4 Total lbs lost since last in-office visit: 4  Interim History: Andrea Colon had a long couple of weeks. Protein goal is a stretch, but she is working on it. Her protein intake varies 63-118 grams. She is disappointed with 4 lb weight loss. She stayed within calories. Pt denies hunger, if she got protein in. She definitely noticed some foods that are low in protein aren't as satiating.  Subjective:   1. Vitamin D insufficiency Andrea Colon is on prescription Vit D weekly. Her Vit D level is 23.8.  2. High triglycerides Andrea Colon has an LDL of 81, HDL 31.7, and triglycerides 222. She is not on statin therapy.  3. Essential hypertension Andrea Colon's BP is controlled today. She hasn't taken BP meds in about a week.  4. Pre-diabetes Andrea Colon's A1c is 6.1 and insulin level 56.1. She is not on meds.  Assessment/Plan:   1. Vitamin D insufficiency Low Vitamin D level contributes to fatigue and are associated with obesity, breast, and colon cancer. She agrees to continue to take prescription Vitamin D @50 ,000 IU every week and will follow-up for routine testing of Vitamin D, at least 2-3 times per year to avoid over-replacement.  2. High triglycerides Cardiovascular risk and specific lipid/LDL goals reviewed.  We discussed several lifestyle modifications today and [name] will continue to work on diet, exercise and weight loss efforts. Orders and follow up  as documented in patient record.  Continue logging/journaling. Repeat labs in 3 months.  Counseling Intensive lifestyle modifications are the first line treatment for this issue. Dietary changes: Increase soluble fiber. Decrease simple carbohydrates. Exercise changes: Moderate to vigorous-intensity aerobic activity 150 minutes per week if tolerated. Lipid-lowering medications: see documented in medical record.  3. Essential hypertension [name] is working on healthy weight loss and exercise to improve blood pressure control. We will watch for signs of hypotension as she continues her lifestyle modifications. Follow up BP at next appt.  4. Pre-diabetes Andrea Colon will continue to work on weight loss, exercise, and decreasing simple carbohydrates to help decrease the risk of diabetes. Continue journaling. Pt was previously on Metformin with no improvement in A1c or insulin level.  5. Class 3 severe obesity with serious comorbidity and body mass index (BMI) of 40.0 to 44.9 in adult, unspecified obesity type (HCC)  Andrea Colon is currently in the action stage of change. As such, her goal is to continue with weight loss efforts. She has agreed to keeping a food journal and adhering to recommended goals of 1750-1900 calories and 125+ g protein.   Exercise goals: No exercise has been prescribed at this time.  Behavioral modification strategies: increasing lean protein intake, meal planning and cooking strategies, keeping healthy foods in the home, planning for success, and keeping a strict food journal.  Andrea Colon has agreed to follow-up with our clinic in 2-3 weeks. She was informed of the importance of frequent follow-up visits to maximize her success with intensive lifestyle modifications for her multiple  health conditions.   Objective:   Blood pressure 129/81, pulse 83, temperature 98.1 F (36.7 C), height 5\' 4"  (1.626 m), weight 265 lb (120.2 kg), last menstrual period 05/24/2021, SpO2 98 %. Body mass  index is 45.49 kg/m.  General: Cooperative, alert, well developed, in no acute distress. HEENT: Conjunctivae and lids unremarkable. Cardiovascular: Regular rhythm.  Lungs: Normal work of breathing. Neurologic: No focal deficits.   Lab Results  Component Value Date   CREATININE 0.80 05/22/2021   BUN 10 05/22/2021   NA 136 05/22/2021   K 3.8 05/22/2021   CL 99 05/22/2021   CO2 28 05/22/2021   Lab Results  Component Value Date   ALT 15 05/22/2021   AST 14 05/22/2021   ALKPHOS 37 (L) 05/22/2021   BILITOT 0.4 05/22/2021   Lab Results  Component Value Date   HGBA1C 6.1 05/22/2021   HGBA1C 6.3 02/08/2020   HGBA1C 6.4 (A) 10/01/2018   HGBA1C 6.1 04/09/2016   HGBA1C 5.5 05/14/2011   Lab Results  Component Value Date   INSULIN 56.1 (H) 05/31/2021   INSULIN 35.1 (H) 04/26/2020   Lab Results  Component Value Date   TSH 2.440 05/31/2021   Lab Results  Component Value Date   CHOL 137 05/22/2021   HDL 31.70 (L) 05/22/2021   LDLCALC 61 10/01/2018   LDLDIRECT 81.0 05/22/2021   TRIG 222.0 (H) 05/22/2021   CHOLHDL 4 05/22/2021   Lab Results  Component Value Date   WBC 9.0 05/31/2021   HGB 12.5 05/31/2021   HCT 37.4 05/31/2021   MCV 85 05/31/2021   PLT 335 05/31/2021   Lab Results  Component Value Date   FERRITIN 24 01/21/2017     Attestation Statements:   Reviewed by clinician on day of visit: allergies, medications, problem list, medical history, surgical history, family history, social history, and previous encounter notes.  Time spent on visit including pre-visit chart review and post-visit care and charting was 35 minutes.   Coral Ceo, CMA, am acting as transcriptionist for Coralie Common, MD.   I have reviewed the above documentation for accuracy and completeness, and I agree with the above. - Jinny Blossom, MD

## 2021-06-27 ENCOUNTER — Encounter (INDEPENDENT_AMBULATORY_CARE_PROVIDER_SITE_OTHER): Payer: Self-pay

## 2021-06-28 ENCOUNTER — Ambulatory Visit (INDEPENDENT_AMBULATORY_CARE_PROVIDER_SITE_OTHER): Payer: 59 | Admitting: Family Medicine

## 2021-06-28 ENCOUNTER — Encounter (INDEPENDENT_AMBULATORY_CARE_PROVIDER_SITE_OTHER): Payer: Self-pay | Admitting: Family Medicine

## 2021-06-28 ENCOUNTER — Other Ambulatory Visit: Payer: Self-pay

## 2021-06-28 VITALS — BP 135/81 | HR 73 | Temp 98.0°F | Ht 64.0 in | Wt 259.0 lb

## 2021-06-28 DIAGNOSIS — R7303 Prediabetes: Secondary | ICD-10-CM | POA: Diagnosis not present

## 2021-06-28 DIAGNOSIS — Z6841 Body Mass Index (BMI) 40.0 and over, adult: Secondary | ICD-10-CM | POA: Diagnosis not present

## 2021-06-28 DIAGNOSIS — E559 Vitamin D deficiency, unspecified: Secondary | ICD-10-CM

## 2021-07-04 NOTE — Progress Notes (Signed)
Chief Complaint:   OBESITY Andrea Colon is here to discuss her progress with her obesity treatment plan along with follow-up of her obesity related diagnoses. Andrea Colon is on keeping a food journal and adhering to recommended goals of 1750-1900 calories and 125 g protein and states she is following her eating plan approximately 70% of the time. Andrea Colon states she is not currently exercising.  Today's visit was #: 3 Starting weight: 269 lbs Starting date: 05/31/2021 Today's weight: 259 lbs Today's date: 06/28/2021 Total lbs lost to date: 10 Total lbs lost since last in-office visit: 6  Interim History: Andrea Colon has been journaling. She struggled with hitting protein goal daily, but has been keeping track. Her calories range 1400-1800 per day. Protein average is 71-121 grams. She has no plans for the next couple of weeks. Her biggest obstacle in the next few weeks is pre-planning and learning how to spread food out over the day.  Subjective:   1. Pre-diabetes Andrea Colon's last A1c was 6.1 and insulin level 56.1. She is not on Metformin.  2. Vitamin D insufficiency Pt denies nausea, vomiting, and muscle weakness but notes fatigue. Pt is on prescription Vit D.  Assessment/Plan:   1. Pre-diabetes Andrea Colon will continue to work on weight loss, exercise, and decreasing simple carbohydrates to help decrease the risk of diabetes. Follow up labs in 3 months.  2. Vitamin D insufficiency Low Vitamin D level contributes to fatigue and are associated with obesity, breast, and colon cancer. She agrees to continue to take prescription Vitamin D @50 ,000 IU every week and will follow-up for routine testing of Vitamin D, at least 2-3 times per year to avoid over-replacement. No refill needed today.  3. Class 3 severe obesity with serious comorbidity and body mass index (BMI) of 40.0 to 44.9 in adult, unspecified obesity type (HCC)  Andrea Colon is currently in the action stage of change. As such, her goal is to continue  with weight loss efforts. She has agreed to keeping a food journal and adhering to recommended goals of 1750-1900 calories and 125+ g protein.   Exercise goals: All adults should avoid inactivity. Some physical activity is better than none, and adults who participate in any amount of physical activity gain some health benefits.  Behavioral modification strategies: increasing lean protein intake, meal planning and cooking strategies, keeping healthy foods in the home, and planning for success.  Andrea Colon has agreed to follow-up with our clinic in 2-3 weeks. She was informed of the importance of frequent follow-up visits to maximize her success with intensive lifestyle modifications for her multiple health conditions.   Objective:   Blood pressure 135/81, pulse 73, temperature 98 F (36.7 C), height 5\' 4"  (1.626 m), weight 259 lb (117.5 kg), SpO2 98 %. Body mass index is 44.46 kg/m.  General: Cooperative, alert, well developed, in no acute distress. HEENT: Conjunctivae and lids unremarkable. Cardiovascular: Regular rhythm.  Lungs: Normal work of breathing. Neurologic: No focal deficits.   Lab Results  Component Value Date   CREATININE 0.80 05/22/2021   BUN 10 05/22/2021   NA 136 05/22/2021   K 3.8 05/22/2021   CL 99 05/22/2021   CO2 28 05/22/2021   Lab Results  Component Value Date   ALT 15 05/22/2021   AST 14 05/22/2021   ALKPHOS 37 (L) 05/22/2021   BILITOT 0.4 05/22/2021   Lab Results  Component Value Date   HGBA1C 6.1 05/22/2021   HGBA1C 6.3 02/08/2020   HGBA1C 6.4 (A) 10/01/2018   HGBA1C  6.1 04/09/2016   HGBA1C 5.5 05/14/2011   Lab Results  Component Value Date   INSULIN 56.1 (H) 05/31/2021   INSULIN 35.1 (H) 04/26/2020   Lab Results  Component Value Date   TSH 2.440 05/31/2021   Lab Results  Component Value Date   CHOL 137 05/22/2021   HDL 31.70 (L) 05/22/2021   LDLCALC 61 10/01/2018   LDLDIRECT 81.0 05/22/2021   TRIG 222.0 (H) 05/22/2021   CHOLHDL 4  05/22/2021   Lab Results  Component Value Date   VD25OH 23.88 (L) 05/22/2021   VD25OH 28.3 (L) 04/26/2020   Lab Results  Component Value Date   WBC 9.0 05/31/2021   HGB 12.5 05/31/2021   HCT 37.4 05/31/2021   MCV 85 05/31/2021   PLT 335 05/31/2021   Lab Results  Component Value Date   FERRITIN 24 01/21/2017    Attestation Statements:   Reviewed by clinician on day of visit: allergies, medications, problem list, medical history, surgical history, family history, social history, and previous encounter notes.  Coral Ceo, CMA, am acting as transcriptionist for Coralie Common, MD.   I have reviewed the above documentation for accuracy and completeness, and I agree with the above. - Coralie Common, MD

## 2021-07-12 ENCOUNTER — Other Ambulatory Visit: Payer: Self-pay

## 2021-07-12 ENCOUNTER — Encounter (INDEPENDENT_AMBULATORY_CARE_PROVIDER_SITE_OTHER): Payer: Self-pay | Admitting: Family Medicine

## 2021-07-12 ENCOUNTER — Ambulatory Visit (INDEPENDENT_AMBULATORY_CARE_PROVIDER_SITE_OTHER): Payer: 59 | Admitting: Family Medicine

## 2021-07-12 VITALS — BP 111/73 | HR 86 | Temp 98.1°F | Ht 64.0 in | Wt 262.0 lb

## 2021-07-12 DIAGNOSIS — Z6841 Body Mass Index (BMI) 40.0 and over, adult: Secondary | ICD-10-CM | POA: Diagnosis not present

## 2021-07-12 DIAGNOSIS — R7303 Prediabetes: Secondary | ICD-10-CM

## 2021-07-12 DIAGNOSIS — E66813 Obesity, class 3: Secondary | ICD-10-CM | POA: Insufficient documentation

## 2021-07-13 ENCOUNTER — Encounter (INDEPENDENT_AMBULATORY_CARE_PROVIDER_SITE_OTHER): Payer: Self-pay | Admitting: Family Medicine

## 2021-07-16 NOTE — Telephone Encounter (Signed)
Last OV with Dawn 

## 2021-07-17 ENCOUNTER — Encounter (INDEPENDENT_AMBULATORY_CARE_PROVIDER_SITE_OTHER): Payer: Self-pay | Admitting: Family Medicine

## 2021-07-17 NOTE — Telephone Encounter (Signed)
Pt last seen by Dawn Whitmire, FNP.  

## 2021-07-19 ENCOUNTER — Encounter (INDEPENDENT_AMBULATORY_CARE_PROVIDER_SITE_OTHER): Payer: Self-pay | Admitting: Family Medicine

## 2021-07-19 NOTE — Progress Notes (Signed)
Chief Complaint:   OBESITY Andrea Colon is here to discuss her progress with her obesity treatment plan along with follow-up of her obesity related diagnoses. Andrea Colon is on keeping a food journal and adhering to recommended goals of 1750-1900 calories and 125 grams of protein and states she is following her eating plan approximately 50% of the time. Andrea Colon states she is doing 0 minutes 0 times per week.  Today's visit was #: 4 Starting weight: 269 lbs Starting date: 05/31/2021 Today's weight: 262 lbs Today's date: 07/12/2021 Total lbs lost to date: 7 lbs Total lbs lost since last in-office visit: 0  Interim History: Andrea Colon had a recent death of a friend's parent and other activities that have promoted off plan eating. She has not been journaling consistently. She struggles to get even 100 grams of protein. Goal is 125 gms.  Subjective:   1. Pre-diabetes Andrea Colon is not on Metformin. Her last A1C level was 6.1.  Lab Results  Component Value Date   HGBA1C 6.1 05/22/2021   Lab Results  Component Value Date   INSULIN 56.1 (H) 05/31/2021   INSULIN 35.1 (H) 04/26/2020     Assessment/Plan:   1. Pre-diabetes Andrea Colon will continue meal plan and she will continue to work on weight loss, exercise, and decreasing simple carbohydrates to help decrease the risk of diabetes.    2. Obesity: Current BMI 44.95 Andrea Colon is currently in the action stage of change. As such, her goal is to continue with weight loss efforts. She has agreed to keeping a food journal and adhering to recommended goals of 1750-1900 calories and 125 grams of protein.   Andrea Colon will get in at least 100 grams of protein daily. Handout: Protein content of food, recipes.  Exercise goals: No exercise has been prescribed at this time.  Behavioral modification strategies: increasing lean protein intake and meal planning and cooking strategies.  Andrea Colon has agreed to follow-up with our clinic in 2 weeks. She was informed of the  importance of frequent follow-up visits to maximize her success with intensive lifestyle modifications for her multiple health conditions.   Objective:   Blood pressure 111/73, pulse 86, temperature 98.1 F (36.7 C), height '5\' 4"'$  (1.626 m), weight 262 lb (118.8 kg), SpO2 98 %. Body mass index is 44.97 kg/m.  General: Cooperative, alert, well developed, in no acute distress. HEENT: Conjunctivae and lids unremarkable. Cardiovascular: Regular rhythm.  Lungs: Normal work of breathing. Neurologic: No focal deficits.   Lab Results  Component Value Date   CREATININE 0.80 05/22/2021   BUN 10 05/22/2021   NA 136 05/22/2021   K 3.8 05/22/2021   CL 99 05/22/2021   CO2 28 05/22/2021   Lab Results  Component Value Date   ALT 15 05/22/2021   AST 14 05/22/2021   ALKPHOS 37 (L) 05/22/2021   BILITOT 0.4 05/22/2021   Lab Results  Component Value Date   HGBA1C 6.1 05/22/2021   HGBA1C 6.3 02/08/2020   HGBA1C 6.4 (A) 10/01/2018   HGBA1C 6.1 04/09/2016   HGBA1C 5.5 05/14/2011   Lab Results  Component Value Date   INSULIN 56.1 (H) 05/31/2021   INSULIN 35.1 (H) 04/26/2020   Lab Results  Component Value Date   TSH 2.440 05/31/2021   Lab Results  Component Value Date   CHOL 137 05/22/2021   HDL 31.70 (L) 05/22/2021   LDLCALC 61 10/01/2018   LDLDIRECT 81.0 05/22/2021   TRIG 222.0 (H) 05/22/2021   CHOLHDL 4 05/22/2021   Lab Results  Component Value Date   VD25OH 23.88 (L) 05/22/2021   VD25OH 28.3 (L) 04/26/2020   Lab Results  Component Value Date   WBC 9.0 05/31/2021   HGB 12.5 05/31/2021   HCT 37.4 05/31/2021   MCV 85 05/31/2021   PLT 335 05/31/2021   Lab Results  Component Value Date   FERRITIN 24 01/21/2017    Attestation Statements:   Reviewed by clinician on day of visit: allergies, medications, problem list, medical history, surgical history, family history, social history, and previous encounter notes.  I, Lizbeth Bark, RMA, am acting as Location manager for  [employer], [place].   I have reviewed the above documentation for accuracy and completeness, and I agree with the above. -  Georgianne Fick, FNP

## 2021-07-26 ENCOUNTER — Ambulatory Visit (INDEPENDENT_AMBULATORY_CARE_PROVIDER_SITE_OTHER): Payer: 59 | Admitting: Family Medicine

## 2021-07-26 DIAGNOSIS — U071 COVID-19: Secondary | ICD-10-CM

## 2021-07-27 MED ORDER — GUAIFENESIN-CODEINE 100-10 MG/5ML PO SYRP: 5.0000 mL | ORAL_SOLUTION | Freq: Three times a day (TID) | ORAL | 0 refills | Status: DC | PRN

## 2021-07-27 MED ORDER — NIRMATRELVIR/RITONAVIR (PAXLOVID)TABLET: 3.0000 | ORAL_TABLET | Freq: Two times a day (BID) | ORAL | 0 refills | Status: AC

## 2021-07-27 NOTE — Telephone Encounter (Signed)
Will have Anda Kraft review since Dr. Diona Browner is out of office and I am not sure she will be on her in basket today.

## 2021-08-08 NOTE — Assessment & Plan Note (Signed)
Encouraged exercise, weight loss, healthy eating habits.  [facility]

## 2021-08-09 ENCOUNTER — Encounter (INDEPENDENT_AMBULATORY_CARE_PROVIDER_SITE_OTHER): Payer: Self-pay | Admitting: Family Medicine

## 2021-08-09 ENCOUNTER — Ambulatory Visit (INDEPENDENT_AMBULATORY_CARE_PROVIDER_SITE_OTHER): Payer: 59 | Admitting: Family Medicine

## 2021-08-09 ENCOUNTER — Other Ambulatory Visit: Payer: Self-pay

## 2021-08-09 VITALS — BP 103/60 | HR 96 | Temp 98.6°F | Ht 64.0 in | Wt 263.0 lb

## 2021-08-09 DIAGNOSIS — R7303 Prediabetes: Secondary | ICD-10-CM

## 2021-08-09 DIAGNOSIS — Z9189 Other specified personal risk factors, not elsewhere classified: Secondary | ICD-10-CM | POA: Diagnosis not present

## 2021-08-09 DIAGNOSIS — Z6841 Body Mass Index (BMI) 40.0 and over, adult: Secondary | ICD-10-CM | POA: Diagnosis not present

## 2021-08-09 DIAGNOSIS — E559 Vitamin D deficiency, unspecified: Secondary | ICD-10-CM | POA: Diagnosis not present

## 2021-08-09 MED ORDER — ONDANSETRON HCL 8 MG PO TABS: 8.0000 mg | ORAL_TABLET | Freq: Three times a day (TID) | ORAL | 0 refills | Status: DC | PRN

## 2021-08-09 MED ORDER — VITAMIN D (ERGOCALCIFEROL) 1.25 MG (50000 UNIT) PO CAPS: 50000.0000 [IU] | ORAL_CAPSULE | ORAL | 0 refills | Status: DC

## 2021-08-09 MED ORDER — TIRZEPATIDE 2.5 MG/0.5ML ~~LOC~~ SOAJ: 2.5000 mg | SUBCUTANEOUS | 0 refills | Status: DC

## 2021-08-13 NOTE — Progress Notes (Signed)
Chief Complaint:   OBESITY Andrea Colon is here to discuss her progress with her obesity treatment plan along with follow-up of her obesity related diagnoses. Andrea Colon is on keeping a food journal and adhering to recommended goals of 1750-1900 calories and 125 grams of protein and states she is following her eating plan approximately 0% of the time. Andrea Colon states she is doing 0 minutes 0 times per week.  Today's visit was #: 5 Starting weight: 269 lbs Starting date: 05/31/2021 Today's weight: 263 lbs Today's date: 08/09/2021 Total lbs lost to date: 6 lbs Total lbs lost since last in-office visit: 0  Interim History: Lesia had COVID and has not yet gotten back on plan. She is happy with gaining only 1 lb.  She had Covid 3 weeks ago and does not feel totally well yet.  Subjective:   1. Prediabetes Andrea Colon's last A1C was 6.1. She is currently not on medication. She notes polyphagia.   Lab Results  Component Value Date   HGBA1C 6.1 05/22/2021   Lab Results  Component Value Date   INSULIN 56.1 (H) 05/31/2021   INSULIN 35.1 (H) 04/26/2020    2. Vitamin D deficiency Andrea Colon's last Vitamin D was low at 23.88. She is on weekly prescription Vitamin D.  Lab Results  Component Value Date   VD25OH 23.88 (L) 05/22/2021   VD25OH 28.3 (L) 04/26/2020    3. At risk for side effect of medication Andrea Colon is at risk for side effect of medication due to start of Mounjaro.  Assessment/Plan:   1. Prediabetes Andrea Colon agrees to start Mounjaro 2.5 mg weekly with no refills. She will continue to work on weight loss, exercise, and decreasing simple carbohydrates to help decrease the risk of diabetes.   - tirzepatide [facility]) 2.5 MG/0.5ML Pen; Inject 2.5 mg into the skin once a week.  Dispense: 2 mL; Refill: 0  - ondansetron (ZOFRAN) 8 MG tablet; Take 1 tablet (8 mg total) by mouth every 8 (eight) hours as needed for nausea or vomiting.  Dispense: 20 tablet; Refill: 0  2. Vitamin D deficiency Low  Vitamin D level contributes to fatigue and are associated with obesity, breast, and colon cancer. We will refill prescription Vitamin D 50,000 IU every week and Andrea Colon will follow-up for routine testing of Vitamin D, at least 2-3 times per year to avoid over-replacement.  - Vitamin D, Ergocalciferol, (DRISDOL) 1.25 MG (50000 UNIT) CAPS capsule; Take 1 capsule (50,000 Units total) by mouth every 7 (seven) days.  Dispense: 12 capsule; Refill: 0  3. At risk for side effect of medication Andrea Colon was given approximately 15 minutes of drug side effect counseling today.  We discussed side effect possibility and risk versus benefits. Andrea Colon agreed to the medication and will contact this office if these side effects are intolerable.  Repetitive spaced learning was employed today to elicit superior memory formation and behavioral change.   4. Obesity: Current BMI 45.12 Andrea Colon is currently in the action stage of change. As such, her goal is to continue with weight loss efforts. She has agreed to keeping a food journal and adhering to recommended goals of 1750-1900 calories and 100 grams of protein daily.  Exercise goals: No exercise has been prescribed at this time.  Behavioral modification strategies: increasing lean protein intake, decreasing simple carbohydrates, and keeping a strict food journal.  Andrea Colon has agreed to follow-up with our clinic in 2-3 weeks.  Objective:   Blood pressure 103/60, pulse 96, temperature 98.6 F (37 C), height  $'5\' 4"'v$  (1.626 m), weight 263 lb (119.3 kg), SpO2 99 %. Body mass index is 45.14 kg/m.  General: Cooperative, alert, well developed, in no acute distress. HEENT: Conjunctivae and lids unremarkable. Cardiovascular: Regular rhythm.  Lungs: Normal work of breathing. Neurologic: No focal deficits.   Lab Results  Component Value Date   CREATININE 0.80 05/22/2021   BUN 10 05/22/2021   NA 136 05/22/2021   K 3.8 05/22/2021   CL 99 05/22/2021   CO2 28 05/22/2021    Lab Results  Component Value Date   ALT 15 05/22/2021   AST 14 05/22/2021   ALKPHOS 37 (L) 05/22/2021   BILITOT 0.4 05/22/2021   Lab Results  Component Value Date   HGBA1C 6.1 05/22/2021   HGBA1C 6.3 02/08/2020   HGBA1C 6.4 (A) 10/01/2018   HGBA1C 6.1 04/09/2016   HGBA1C 5.5 05/14/2011   Lab Results  Component Value Date   INSULIN 56.1 (H) 05/31/2021   INSULIN 35.1 (H) 04/26/2020   Lab Results  Component Value Date   TSH 2.440 05/31/2021   Lab Results  Component Value Date   CHOL 137 05/22/2021   HDL 31.70 (L) 05/22/2021   LDLCALC 61 10/01/2018   LDLDIRECT 81.0 05/22/2021   TRIG 222.0 (H) 05/22/2021   CHOLHDL 4 05/22/2021   Lab Results  Component Value Date   VD25OH 23.88 (L) 05/22/2021   VD25OH 28.3 (L) 04/26/2020   Lab Results  Component Value Date   WBC 9.0 05/31/2021   HGB 12.5 05/31/2021   HCT 37.4 05/31/2021   MCV 85 05/31/2021   PLT 335 05/31/2021   Lab Results  Component Value Date   FERRITIN 24 01/21/2017    Attestation Statements:   Reviewed by clinician on day of visit: allergies, medications, problem list, medical history, surgical history, family history, social history, and previous encounter notes.  I, Lizbeth Bark, RMA, am acting as Location manager for [employer], [place].   I have reviewed the above documentation for accuracy and completeness, and I agree with the above. -  Georgianne Fick, FNP

## 2021-08-14 ENCOUNTER — Encounter (INDEPENDENT_AMBULATORY_CARE_PROVIDER_SITE_OTHER): Payer: Self-pay | Admitting: Family Medicine

## 2021-09-03 ENCOUNTER — Ambulatory Visit (INDEPENDENT_AMBULATORY_CARE_PROVIDER_SITE_OTHER): Payer: 59 | Admitting: Family Medicine

## 2021-09-03 ENCOUNTER — Other Ambulatory Visit: Payer: Self-pay

## 2021-09-03 ENCOUNTER — Encounter (INDEPENDENT_AMBULATORY_CARE_PROVIDER_SITE_OTHER): Payer: Self-pay | Admitting: Family Medicine

## 2021-09-03 VITALS — BP 112/78 | HR 92 | Temp 97.9°F | Ht 64.0 in | Wt 261.0 lb

## 2021-09-03 DIAGNOSIS — R7303 Prediabetes: Secondary | ICD-10-CM | POA: Diagnosis not present

## 2021-09-03 DIAGNOSIS — F411 Generalized anxiety disorder: Secondary | ICD-10-CM | POA: Diagnosis not present

## 2021-09-03 DIAGNOSIS — Z6841 Body Mass Index (BMI) 40.0 and over, adult: Secondary | ICD-10-CM | POA: Diagnosis not present

## 2021-09-03 NOTE — Progress Notes (Signed)
Chief Complaint:   OBESITY Andrea Colon is here to discuss her progress with her obesity treatment plan along with follow-up of her obesity related diagnoses. Andrea Colon is on keeping a food journal and adhering to recommended goals of 1750-1900 calories and 100 grams of protein and states she is following her eating plan approximately 90% of the time. Andrea Colon states she is doing 0 minutes 0 times per week.  Today's visit was #: 6 Starting weight: 269 lbs Starting date: 05/31/2021 Today's weight: 261 lbs Today's date: 09/03/2021 Total lbs lost to date: 8 lbs Total lbs lost since last in-office visit: 2 lbs  Interim History: Andrea Colon has been journaling consistently for the past week and is down 2 lbs. She has not started the [facility] because she is afraid of side effects.  Subjective:   1. Prediabetes Andrea Colon has not started [facility] yet due to fear of side effects. Her las A1C level was 6.1.  Lab Results  Component Value Date   HGBA1C 6.1 05/22/2021   Lab Results  Component Value Date   INSULIN 56.1 (H) 05/31/2021   INSULIN 35.1 (H) 04/26/2020    2. GAD (generalized anxiety disorder) Andrea Colon is seeing a mental health nurse practitioner this week. She feels she needs to go back on medicine for anxiety since her anxiety negatively impacts her life.  She has been on a few medicines in the past and had side effects (Zoloft caused weight gain, Prozac caused fatigue, Buspar caused lightheadedness).   Assessment/Plan:   1. Prediabetes Andrea Colon will start Mounjaro. She has a prescription for Zofran. I reassured her that most people tolerate this low dose very well.   2. GAD (generalized anxiety disorder) Andrea Colon was informed to tell her PMHNP about weight gain concerns. I suggested Andrea Colon or Andrea Colon.   3. Obesity: Current BMI 44.78 Andrea Colon is currently in the action stage of change. As such, her goal is to continue with weight loss efforts. She has agreed to keeping a food journal and adhering  to recommended goals of 1750-1900 calories and 100 grams of protein daily.  Exercise goals: No exercise has been prescribed at this time.  Behavioral modification strategies: keeping a strict food journal.  Terrianna has agreed to follow-up with our clinic in 3 weeks with Dr. Juleen Colon and 6 weeks with myself.   Objective:   Blood pressure 112/78, pulse 92, temperature 97.9 F (36.6 C), height '5\' 4"'$  (1.626 m), weight 261 lb (118.4 kg), SpO2 99 %. Body mass index is 44.8 kg/m.  General: Cooperative, alert, well developed, in no acute distress. HEENT: Conjunctivae and lids unremarkable. Cardiovascular: Regular rhythm.  Lungs: Normal work of breathing. Neurologic: No focal deficits.   Lab Results  Component Value Date   CREATININE 0.80 05/22/2021   BUN 10 05/22/2021   NA 136 05/22/2021   K 3.8 05/22/2021   CL 99 05/22/2021   CO2 28 05/22/2021   Lab Results  Component Value Date   ALT 15 05/22/2021   AST 14 05/22/2021   ALKPHOS 37 (L) 05/22/2021   BILITOT 0.4 05/22/2021   Lab Results  Component Value Date   HGBA1C 6.1 05/22/2021   HGBA1C 6.3 02/08/2020   HGBA1C 6.4 (A) 10/01/2018   HGBA1C 6.1 04/09/2016   HGBA1C 5.5 05/14/2011   Lab Results  Component Value Date   INSULIN 56.1 (H) 05/31/2021   INSULIN 35.1 (H) 04/26/2020   Lab Results  Component Value Date   TSH 2.440 05/31/2021   Lab Results  Component Value  Date   CHOL 137 05/22/2021   HDL 31.70 (L) 05/22/2021   LDLCALC 61 10/01/2018   LDLDIRECT 81.0 05/22/2021   TRIG 222.0 (H) 05/22/2021   CHOLHDL 4 05/22/2021   Lab Results  Component Value Date   VD25OH 23.88 (L) 05/22/2021   VD25OH 28.3 (L) 04/26/2020   Lab Results  Component Value Date   WBC 9.0 05/31/2021   HGB 12.5 05/31/2021   HCT 37.4 05/31/2021   MCV 85 05/31/2021   PLT 335 05/31/2021   Lab Results  Component Value Date   FERRITIN 24 01/21/2017   Attestation Statements:   Reviewed by clinician on day of visit: allergies, medications,  problem list, medical history, surgical history, family history, social history, and previous encounter notes.  Time spent on visit including pre-visit chart review and post-visit care and charting was 32 minutes.   I, Lizbeth Bark, RMA, am acting as Location manager for [employer], [place].   I have reviewed the above documentation for accuracy and completeness, and I agree with the above. -  Georgianne Fick, FNP

## 2021-09-10 ENCOUNTER — Encounter (INDEPENDENT_AMBULATORY_CARE_PROVIDER_SITE_OTHER): Payer: Self-pay | Admitting: Family Medicine

## 2021-09-10 NOTE — Telephone Encounter (Signed)
Please review and advise.

## 2021-09-11 NOTE — Telephone Encounter (Signed)
FYI

## 2021-09-11 NOTE — Telephone Encounter (Signed)
Dr.Ukleja 

## 2021-09-14 ENCOUNTER — Other Ambulatory Visit: Payer: Self-pay | Admitting: Family Medicine

## 2021-09-14 DIAGNOSIS — I10 Essential (primary) hypertension: Secondary | ICD-10-CM

## 2021-09-14 NOTE — Telephone Encounter (Signed)
Last office visit 05/29/2021 for CPE.  Losartan is not on current medication list.  Per visit on 05/29/2021 note states patient is on 1/2 dose of losartan.  Refill request is for losartan 100 mg one tablet daily.  Please advise.

## 2021-09-18 ENCOUNTER — Encounter (INDEPENDENT_AMBULATORY_CARE_PROVIDER_SITE_OTHER): Payer: Self-pay | Admitting: Family Medicine

## 2021-09-18 ENCOUNTER — Ambulatory Visit (INDEPENDENT_AMBULATORY_CARE_PROVIDER_SITE_OTHER): Payer: 59 | Admitting: Family Medicine

## 2021-09-18 ENCOUNTER — Other Ambulatory Visit: Payer: Self-pay

## 2021-09-18 VITALS — BP 116/78 | HR 90 | Temp 98.1°F | Ht 64.0 in | Wt 254.0 lb

## 2021-09-18 DIAGNOSIS — F411 Generalized anxiety disorder: Secondary | ICD-10-CM

## 2021-09-18 DIAGNOSIS — Z6841 Body Mass Index (BMI) 40.0 and over, adult: Secondary | ICD-10-CM | POA: Diagnosis not present

## 2021-09-18 DIAGNOSIS — R632 Polyphagia: Secondary | ICD-10-CM | POA: Diagnosis not present

## 2021-09-18 DIAGNOSIS — Z9189 Other specified personal risk factors, not elsewhere classified: Secondary | ICD-10-CM

## 2021-09-18 MED ORDER — PROPRANOLOL HCL 20 MG PO TABS: 20.0000 mg | ORAL_TABLET | Freq: Three times a day (TID) | ORAL | 0 refills | Status: DC | PRN

## 2021-09-18 MED ORDER — TOPIRAMATE 25 MG PO TABS: 25.0000 mg | ORAL_TABLET | Freq: Every day | ORAL | 0 refills | Status: DC

## 2021-09-18 MED ORDER — PHENTERMINE HCL 37.5 MG PO TABS: 18.7500 mg | ORAL_TABLET | Freq: Every day | ORAL | 0 refills | Status: DC

## 2021-09-18 NOTE — Progress Notes (Signed)
Chief Complaint:   OBESITY Andrea Colon is here to discuss her progress with her obesity treatment plan along with follow-up of her obesity related diagnoses.   Today's visit was #: 7 Starting weight: 269 lbs Starting date: 05/31/2021 Today's weight: 254 lbs Today's date: 09/18/2021 Weight change since last visit: 7 lbs Total lbs lost to date: 15 lbs Body mass index is 43.6 kg/m.  Total weight loss percentage to date: -5.58%  Current Meal Plan: keeping a food journal and adhering to recommended goals of 1750-1900 calories and 100 grams of protein for 90% of the time.  Current Exercise Plan: None.  Interim History:  Andrea Colon says that [facility] caused nausea and vomiting for a few days.  She was only able to get in 400-700 calories per day.  She reports that Ozempic previously caused the same issues.  Assessment/Plan:   1. Polyphagia Not at goal. Current treatment: None. She will continue to focus on protein-rich, low simple carbohydrate foods. We reviewed the importance of hydration, regular exercise for stress reduction, and restorative sleep. After discussion, patient would like to start below medication. Expectations, risks, and potential side effects reviewed.   Plan:  Start phentermine 18.75 mg (1/2 tablet) daily and Topamax 25 mg daily, as per below.  Stop Adderall, hold Lexapro (only took 2 doses).We can add Lexapro after adjusting to Qsymia equivalent.   - Start phentermine (ADIPEX-P) 37.5 MG tablet; Take 0.5 tablets (18.75 mg total) by mouth daily before breakfast.  Dispense: 15 tablet; Refill: 0 - Start topiramate (TOPAMAX) 25 MG tablet; Take 1 tablet (25 mg total) by mouth daily.  Dispense: 30 tablet; Refill: 0  I have consulted the [place] Controlled Substances Registry for this patient, and feel the risk/benefit ratio today is favorable for proceeding with this prescription for a controlled substance. The patient understands monitoring parameters and red flags.   2. GAD  (generalized anxiety disorder) Andrea Colon will start propranolol 20 mg three times daily as needed for anxiety.  The plan is to start an SSRI next if needed.  - Start propranolol (INDERAL) 20 MG tablet; Take 1 tablet (20 mg total) by mouth 3 (three) times daily as needed.  Dispense: 30 tablet; Refill: 0  3. At risk for side effect of medication Andrea Colon is at risk of drug side effect due to taking Qsymia.  Andrea Colon was given approximately 8 minutes of drug side effect counseling today.  We discussed side effect possibility and risk versus benefits. Andrea Colon agreed to the medication and will contact this office if these side effects are intolerable.  4. Obesity: Current BMI 43.7  Course: Andrea Colon is currently in the action stage of change. As such, her goal is to continue with weight loss efforts.   Nutrition goals: She has agreed to keeping a food journal and adhering to recommended goals of 1750-1900 calories and 100 grams of protein.   Exercise goals:  Increase NEAT.  Behavioral modification strategies: increasing lean protein intake, decreasing simple carbohydrates, and increasing vegetables.  Andrea Colon has agreed to follow-up with our clinic in 3 weeks. She was informed of the importance of frequent follow-up visits to maximize her success with intensive lifestyle modifications for her multiple health conditions.   Objective:   Blood pressure 116/78, pulse 90, temperature 98.1 F (36.7 C), temperature source Oral, height 5\' 4"  (1.626 m), weight 254 lb (115.2 kg), SpO2 98 %. Body mass index is 43.6 kg/m.  General: Cooperative, alert, well developed, in no acute distress. HEENT: Conjunctivae and lids  unremarkable. Cardiovascular: Regular rhythm.  Lungs: Normal work of breathing. Neurologic: No focal deficits.   Lab Results  Component Value Date   CREATININE 0.80 05/22/2021   BUN 10 05/22/2021   NA 136 05/22/2021   K 3.8 05/22/2021   CL 99 05/22/2021   CO2 28 05/22/2021   Lab Results   Component Value Date   ALT 15 05/22/2021   AST 14 05/22/2021   ALKPHOS 37 (L) 05/22/2021   BILITOT 0.4 05/22/2021   Lab Results  Component Value Date   HGBA1C 6.1 05/22/2021   HGBA1C 6.3 02/08/2020   HGBA1C 6.4 (A) 10/01/2018   HGBA1C 6.1 04/09/2016   HGBA1C 5.5 05/14/2011   Lab Results  Component Value Date   INSULIN 56.1 (H) 05/31/2021   INSULIN 35.1 (H) 04/26/2020   Lab Results  Component Value Date   TSH 2.440 05/31/2021   Lab Results  Component Value Date   CHOL 137 05/22/2021   HDL 31.70 (L) 05/22/2021   LDLCALC 61 10/01/2018   LDLDIRECT 81.0 05/22/2021   TRIG 222.0 (H) 05/22/2021   CHOLHDL 4 05/22/2021   Lab Results  Component Value Date   VD25OH 23.88 (L) 05/22/2021   VD25OH 28.3 (L) 04/26/2020   Lab Results  Component Value Date   WBC 9.0 05/31/2021   HGB 12.5 05/31/2021   HCT 37.4 05/31/2021   MCV 85 05/31/2021   PLT 335 05/31/2021   Lab Results  Component Value Date   FERRITIN 24 01/21/2017   Attestation Statements:   Reviewed by clinician on day of visit: allergies, medications, problem list, medical history, surgical history, family history, social history, and previous encounter notes.  I, Water quality scientist, CMA, am acting as transcriptionist for Briscoe Deutscher, DO  I have reviewed the above documentation for accuracy and completeness, and I agree with the above. Briscoe Deutscher, DO

## 2021-09-26 ENCOUNTER — Encounter (INDEPENDENT_AMBULATORY_CARE_PROVIDER_SITE_OTHER): Payer: Self-pay | Admitting: Family Medicine

## 2021-10-09 ENCOUNTER — Ambulatory Visit (INDEPENDENT_AMBULATORY_CARE_PROVIDER_SITE_OTHER): Payer: 59 | Admitting: Family Medicine

## 2021-10-09 ENCOUNTER — Other Ambulatory Visit (INDEPENDENT_AMBULATORY_CARE_PROVIDER_SITE_OTHER): Payer: Self-pay

## 2021-10-09 ENCOUNTER — Other Ambulatory Visit: Payer: Self-pay

## 2021-10-09 ENCOUNTER — Encounter (INDEPENDENT_AMBULATORY_CARE_PROVIDER_SITE_OTHER): Payer: Self-pay | Admitting: Family Medicine

## 2021-10-09 VITALS — BP 132/80 | HR 108 | Temp 98.1°F | Ht 64.0 in | Wt 252.0 lb

## 2021-10-09 DIAGNOSIS — R7303 Prediabetes: Secondary | ICD-10-CM | POA: Diagnosis not present

## 2021-10-09 DIAGNOSIS — R632 Polyphagia: Secondary | ICD-10-CM | POA: Diagnosis not present

## 2021-10-09 DIAGNOSIS — Z6841 Body Mass Index (BMI) 40.0 and over, adult: Secondary | ICD-10-CM

## 2021-10-09 MED ORDER — PHENTERMINE HCL 37.5 MG PO TABS: 18.7500 mg | ORAL_TABLET | Freq: Every day | ORAL | 0 refills | Status: DC

## 2021-10-09 MED ORDER — TOPIRAMATE 25 MG PO TABS: 25.0000 mg | ORAL_TABLET | Freq: Every day | ORAL | 0 refills | Status: DC

## 2021-10-09 NOTE — Progress Notes (Signed)
Chief Complaint:   OBESITY Andrea Colon is here to discuss her progress with her obesity treatment plan along with follow-up of her obesity related diagnoses. Andrea Colon is on keeping a food journal and adhering to recommended goals of 1750-1900 calories and 100 grams of protein and states she is following her eating plan approximately 20% of the time. Andrea Colon states she is doing 0 minutes 0 times per week.  Today's visit was #: 8 Starting weight: 269 lbs Starting date: 05/31/2021 Today's weight: 252 lbs Today's date: 10/09/2021 Total lbs lost to date: 17 lbs Total lbs lost since last in-office visit: 2 lbs  Interim History: Andrea Colon was unable to tolerate GLP-1 due to GI side effects. She says she has not been journaling but trying to make good choices. However she is skipping meals. She does not meal prep and opts to skip meals rather than make poor food choices.   Subjective:   1. Polyphagia Andrea Colon was started on Phentermine and Topamax at last office visit. She feels this combo is helping with appetite. She denies side effects with the medications other than dysgeusia.  She went off Adderall to start Phentermine. She notes some dysgeusia with soda.  BP Readings from Last 3 Encounters:  10/09/21 132/80  09/18/21 116/78  09/03/21 112/78     2. Prediabetes Iqra's last A1C was elevated at 6.1. She was unable to tolerate GLP-l.  Lab Results  Component Value Date   HGBA1C 6.1 05/22/2021   Lab Results  Component Value Date   INSULIN 56.1 (H) 05/31/2021   INSULIN 35.1 (H) 04/26/2020    Assessment/Plan:   1. Polyphagia Intensive lifestyle modifications are the first line treatment for this issue. Andrea Colon's PDMP was reviewed. I will have Dr. Juleen China refill the phentermine. - topiramate (TOPAMAX) 25 MG tablet; Take 1 tablet (25 mg total) by mouth daily.  Dispense: 30 tablet; Refill: 0  2. Prediabetes Andrea Colon will consider starting Metformin at next office visit due to A1c of 6.1. 3.  Obesity: Current BMI 43.23 Andrea Colon is currently in the action stage of change. As such, her goal is to continue with weight loss efforts. She has agreed to keeping a food journal and adhering to recommended goals of 1750-1900 calories and 100 grams of protein daily.   Andrea Colon will journal 5 days per week.   Exercise goals: No exercise has been prescribed at this time.  Behavioral modification strategies: meal planning and cooking strategies, keeping healthy foods in the home, planning for success, and keeping a strict food journal.  Andrea Colon has agreed to follow-up with our clinic in 3 weeks.  Objective:   Blood pressure 132/80, pulse (!) 108, temperature 98.1 F (36.7 C), height 5\' 4"  (1.626 m), weight 252 lb (114.3 kg), SpO2 99 %. Body mass index is 43.26 kg/m.  General: Cooperative, alert, well developed, in no acute distress. HEENT: Conjunctivae and lids unremarkable. Cardiovascular: Regular rhythm.  Lungs: Normal work of breathing. Neurologic: No focal deficits.   Lab Results  Component Value Date   CREATININE 0.80 05/22/2021   BUN 10 05/22/2021   NA 136 05/22/2021   K 3.8 05/22/2021   CL 99 05/22/2021   CO2 28 05/22/2021   Lab Results  Component Value Date   ALT 15 05/22/2021   AST 14 05/22/2021   ALKPHOS 37 (L) 05/22/2021   BILITOT 0.4 05/22/2021   Lab Results  Component Value Date   HGBA1C 6.1 05/22/2021   HGBA1C 6.3 02/08/2020   HGBA1C 6.4 (A) 10/01/2018  HGBA1C 6.1 04/09/2016   HGBA1C 5.5 05/14/2011   Lab Results  Component Value Date   INSULIN 56.1 (H) 05/31/2021   INSULIN 35.1 (H) 04/26/2020   Lab Results  Component Value Date   TSH 2.440 05/31/2021   Lab Results  Component Value Date   CHOL 137 05/22/2021   HDL 31.70 (L) 05/22/2021   LDLCALC 61 10/01/2018   LDLDIRECT 81.0 05/22/2021   TRIG 222.0 (H) 05/22/2021   CHOLHDL 4 05/22/2021   Lab Results  Component Value Date   VD25OH 23.88 (L) 05/22/2021   VD25OH 28.3 (L) 04/26/2020   Lab  Results  Component Value Date   WBC 9.0 05/31/2021   HGB 12.5 05/31/2021   HCT 37.4 05/31/2021   MCV 85 05/31/2021   PLT 335 05/31/2021   Lab Results  Component Value Date   FERRITIN 24 01/21/2017   Attestation Statements:   Reviewed by clinician on day of visit: allergies, medications, problem list, medical history, surgical history, family history, social history, and previous encounter notes.   I, Lizbeth Bark, RMA, am acting as Location manager for [employer], [place].   I have reviewed the above documentation for accuracy and completeness, and I agree with the above. -  Georgianne Fick, FNP

## 2021-10-12 ENCOUNTER — Encounter (INDEPENDENT_AMBULATORY_CARE_PROVIDER_SITE_OTHER): Payer: Self-pay | Admitting: Family Medicine

## 2021-10-12 DIAGNOSIS — R632 Polyphagia: Secondary | ICD-10-CM

## 2021-10-15 MED ORDER — TOPIRAMATE 25 MG PO TABS: 25.0000 mg | ORAL_TABLET | Freq: Every day | ORAL | 0 refills | Status: DC

## 2021-10-15 NOTE — Telephone Encounter (Signed)
Last seen by Dawn  

## 2021-11-05 ENCOUNTER — Encounter (INDEPENDENT_AMBULATORY_CARE_PROVIDER_SITE_OTHER): Payer: Self-pay | Admitting: Family Medicine

## 2021-11-05 ENCOUNTER — Ambulatory Visit (INDEPENDENT_AMBULATORY_CARE_PROVIDER_SITE_OTHER): Payer: 59 | Admitting: Family Medicine

## 2021-11-05 ENCOUNTER — Other Ambulatory Visit: Payer: Self-pay

## 2021-11-05 VITALS — BP 144/81 | HR 86 | Temp 98.0°F | Ht 64.0 in | Wt 253.0 lb

## 2021-11-05 DIAGNOSIS — E559 Vitamin D deficiency, unspecified: Secondary | ICD-10-CM

## 2021-11-05 DIAGNOSIS — F902 Attention-deficit hyperactivity disorder, combined type: Secondary | ICD-10-CM

## 2021-11-05 DIAGNOSIS — F411 Generalized anxiety disorder: Secondary | ICD-10-CM

## 2021-11-05 DIAGNOSIS — Z6841 Body Mass Index (BMI) 40.0 and over, adult: Secondary | ICD-10-CM

## 2021-11-05 MED ORDER — VITAMIN D (ERGOCALCIFEROL) 1.25 MG (50000 UNIT) PO CAPS: 50000.0000 [IU] | ORAL_CAPSULE | ORAL | 0 refills | Status: DC

## 2021-11-05 MED ORDER — PROPRANOLOL HCL 20 MG PO TABS: 20.0000 mg | ORAL_TABLET | Freq: Three times a day (TID) | ORAL | 3 refills | Status: DC | PRN

## 2021-11-06 ENCOUNTER — Encounter (INDEPENDENT_AMBULATORY_CARE_PROVIDER_SITE_OTHER): Payer: Self-pay | Admitting: Family Medicine

## 2021-11-06 NOTE — Progress Notes (Signed)
Chief Complaint:   OBESITY Andrea Colon is here to discuss her progress with her obesity treatment plan along with follow-up of her obesity related diagnoses. See Medical Weight Management Flowsheet for complete bioelectrical impedance results.  Today's visit was #: 9 Starting weight: 269 lbs Starting date: 05/31/2021 Weight change since last visit: +1 lb Total lbs lost to date: 16 lbs Total weight loss percentage to date: -5.95%  Nutrition Plan: Keeping a food journal and adhering to recommended goals of 1750-1900 calories and 100 grams of protein daily for 0% of the time. Activity: None.  Interim History: Andrea Colon went on vacation, had a viral infection, and had a bacterial infection.  Today's bioimpedance results indicate that Andrea Colon has gained 3 pounds of water weight since her last visit.  She says she will be seeing Psychiatry this week.  We discussed starting previously prescribed Lexapro and considering Vyvanse.  Assessment/Plan:   1. Vitamin D deficiency Not at goal. She is taking vitamin D 50,000 IU weekly.  Plan: Continue to take prescription Vitamin D @50 ,000 IU every week as prescribed.  Follow-up for routine testing of Vitamin D, at least 2-3 times per year to avoid over-replacement.  Lab Results  Component Value Date   VD25OH 23.88 (L) 05/22/2021   VD25OH 28.3 (L) 04/26/2020   - Refill Vitamin D, Ergocalciferol, (DRISDOL) 1.25 MG (50000 UNIT) CAPS capsule; Take 1 capsule (50,000 Units total) by mouth every 7 (seven) days.  Dispense: 12 capsule; Refill: 0  2. Attention deficit hyperactivity disorder (ADHD), combined type Worsening. Andrea Colon will consider Vyvanse.   Counseling ADHD is the most missed diagnosis in relation to food and appetite problems. Often the strong urge to binge or to self-medicate with food subsides once the impulsivity and inattention of ADHD are treated. A person can experience a new ability to tune in to the body's signals, control cravings and  improve impulse control.  A deficiency in norepinephrine and dopamine can lead to the following behaviors related to eating: Poor awareness of internal cues of hunger and satiety, or fullness. Inability to follow a meal plan. Inability to judge portion size accurately. Inability to stop bingeing or purging. Distraction by continual thoughts of food, weight and body shape. Increased desire to overeat, especially high calorie, "reward" type foods. Poor self-esteem due to repeated failures of self-control.  3. GAD (generalized anxiety disorder) Andrea Colon is taking propranolol 20 mg three times daily as needed for anxiety.  We discussed starting Lexapro, which was previously prescribed.  Plan:  Refill propranolol today, as per below.  - Refill propranolol (INDERAL) 20 MG tablet; Take 1 tablet (20 mg total) by mouth 3 (three) times daily as needed.  Dispense: 30 tablet; Refill: 3  4. Obesity: Current BMI 43.6  Course: Andrea Colon is currently in the action stage of change. As such, her goal is to continue with weight loss efforts.   Nutrition goals: She has agreed to keeping a food journal and adhering to recommended goals of 1750-1900 calories and 100 grams of protein.   Exercise goals:  Increase NEAT.  Behavioral modification strategies: increasing lean protein intake, decreasing simple carbohydrates, increasing vegetables, increasing water intake, and emotional eating strategies.  Andrea Colon has agreed to follow-up with our clinic in 4 weeks. She was informed of the importance of frequent follow-up visits to maximize her success with intensive lifestyle modifications for her multiple health conditions.   Objective:   Blood pressure (!) 144/81, pulse 86, temperature 98 F (36.7 C), temperature source Oral, height  5\' 4"  (1.626 m), weight 253 lb (114.8 kg), SpO2 98 %. Body mass index is 43.43 kg/m.  General: Cooperative, alert, well developed, in no acute distress. HEENT: Conjunctivae and lids  unremarkable. Cardiovascular: Regular rhythm.  Lungs: Normal work of breathing. Neurologic: No focal deficits.   Lab Results  Component Value Date   CREATININE 0.80 05/22/2021   BUN 10 05/22/2021   NA 136 05/22/2021   K 3.8 05/22/2021   CL 99 05/22/2021   CO2 28 05/22/2021   Lab Results  Component Value Date   ALT 15 05/22/2021   AST 14 05/22/2021   ALKPHOS 37 (L) 05/22/2021   BILITOT 0.4 05/22/2021   Lab Results  Component Value Date   HGBA1C 6.1 05/22/2021   HGBA1C 6.3 02/08/2020   HGBA1C 6.4 (A) 10/01/2018   HGBA1C 6.1 04/09/2016   HGBA1C 5.5 05/14/2011   Lab Results  Component Value Date   INSULIN 56.1 (H) 05/31/2021   INSULIN 35.1 (H) 04/26/2020   Lab Results  Component Value Date   TSH 2.440 05/31/2021   Lab Results  Component Value Date   CHOL 137 05/22/2021   HDL 31.70 (L) 05/22/2021   LDLCALC 61 10/01/2018   LDLDIRECT 81.0 05/22/2021   TRIG 222.0 (H) 05/22/2021   CHOLHDL 4 05/22/2021   Lab Results  Component Value Date   VD25OH 23.88 (L) 05/22/2021   VD25OH 28.3 (L) 04/26/2020   Lab Results  Component Value Date   WBC 9.0 05/31/2021   HGB 12.5 05/31/2021   HCT 37.4 05/31/2021   MCV 85 05/31/2021   PLT 335 05/31/2021   Lab Results  Component Value Date   FERRITIN 24 01/21/2017   Attestation Statements:   Reviewed by clinician on day of visit: allergies, medications, problem list, medical history, surgical history, family history, social history, and previous encounter notes.  Time spent on visit including pre-visit chart review and post-visit care and documentation was 43 minutes. Time was spent on: Food choices and timing of food intake reviewed today. I discussed a personalized meal plan with the patient that will help her to lose weight and will improve her obesity-related conditions going forward. I performed a medically necessary appropriate examination and/or evaluation. I discussed the assessment and treatment plan with the patient.  Motivational interviewing as well as evidence-based interventions for health behavior change were utilized today including the discussion of self monitoring techniques, problem-solving barriers and SMART goal setting techniques.  An exercise prescription was reviewed.  The patient was provided an opportunity to ask questions and all were answered. The patient agreed with the plan and demonstrated an understanding of the instructions. Clinical information was updated and documented in the EMR.  I, Water quality scientist, CMA, am acting as transcriptionist for Briscoe Deutscher, DO  I have reviewed the above documentation for accuracy and completeness, and I agree with the above. -  Briscoe Deutscher, DO, MS, FAAFP, DABOM - Family and Bariatric Medicine.

## 2021-11-06 NOTE — Telephone Encounter (Signed)
Dr.Wallace °

## 2021-11-13 MED ORDER — ESCITALOPRAM OXALATE 10 MG PO TABS: 10.0000 mg | ORAL_TABLET | Freq: Every day | ORAL | 1 refills | Status: DC

## 2021-11-13 MED ORDER — LISDEXAMFETAMINE DIMESYLATE 20 MG PO CAPS: 20.0000 mg | ORAL_CAPSULE | Freq: Every morning | ORAL | 0 refills | Status: DC

## 2021-11-26 ENCOUNTER — Ambulatory Visit (INDEPENDENT_AMBULATORY_CARE_PROVIDER_SITE_OTHER): Payer: 59 | Admitting: Family Medicine

## 2021-12-10 ENCOUNTER — Ambulatory Visit (INDEPENDENT_AMBULATORY_CARE_PROVIDER_SITE_OTHER): Payer: 59 | Admitting: Family Medicine

## 2021-12-13 ENCOUNTER — Other Ambulatory Visit: Payer: Self-pay

## 2021-12-13 ENCOUNTER — Encounter (INDEPENDENT_AMBULATORY_CARE_PROVIDER_SITE_OTHER): Payer: Self-pay | Admitting: Family Medicine

## 2021-12-13 ENCOUNTER — Ambulatory Visit (INDEPENDENT_AMBULATORY_CARE_PROVIDER_SITE_OTHER): Payer: 59 | Admitting: Family Medicine

## 2021-12-13 VITALS — BP 96/70 | HR 114 | Temp 98.2°F | Ht 64.0 in | Wt 253.0 lb

## 2021-12-13 DIAGNOSIS — F411 Generalized anxiety disorder: Secondary | ICD-10-CM

## 2021-12-13 DIAGNOSIS — Z6841 Body Mass Index (BMI) 40.0 and over, adult: Secondary | ICD-10-CM

## 2021-12-13 DIAGNOSIS — E559 Vitamin D deficiency, unspecified: Secondary | ICD-10-CM

## 2021-12-13 DIAGNOSIS — F5081 Binge eating disorder: Secondary | ICD-10-CM | POA: Diagnosis not present

## 2021-12-13 MED ORDER — ESCITALOPRAM OXALATE 5 MG PO TABS: 5.0000 mg | ORAL_TABLET | Freq: Every day | ORAL | 0 refills | Status: DC

## 2021-12-13 MED ORDER — VYVANSE 60 MG PO CHEW: 60.0000 mg | CHEWABLE_TABLET | Freq: Every day | ORAL | 0 refills | Status: DC

## 2021-12-13 NOTE — Telephone Encounter (Signed)
Pt was here today for a visit.  It doesn't look like Dr. Juleen China signed off medication in her chart.

## 2021-12-19 NOTE — Progress Notes (Signed)
Chief Complaint:   OBESITY Andrea Colon is here to discuss her progress with her obesity treatment plan along with follow-up of her obesity related diagnoses. See Medical Weight Management Flowsheet for complete bioelectrical impedance results.  Today's visit was #: 10 Starting weight: 269 lbs Starting date: 05/31/2021 Weight change since last visit: 0 Total lbs lost to date: 16 Total weight loss percentage to date: -5.95%  Nutrition Plan: Keeping a food journal and adhering to recommended goals of 1750-1900 calories and 100 grams of protein daily for 0% of the time. Activity: None.  Assessment/Plan:   1. Vitamin D deficiency Not at goal. She is taking vitamin D 50,000 IU weekly.  Plan: Continue to take prescription Vitamin D @50 ,000 IU every week as prescribed.  Follow-up for routine testing of Vitamin D, at least 2-3 times per year to avoid over-replacement.  Lab Results  Component Value Date   VD25OH 23.88 (L) 05/22/2021   VD25OH 28.3 (L) 04/26/2020   2. Binge eating disorder, with ADHD Not at goal. Medication:  Ariam is taking Vyvanse 20 mg daily for BED and ADHD.  Counseling ADHD is the most missed diagnosis in relation to food and appetite problems. Often the strong urge to binge or to self-medicate with food subsides once the impulsivity and inattention of ADHD are treated. A person can experience a new ability to tune in to the body's signals, control cravings and improve impulse control.  A deficiency in norepinephrine and dopamine can lead to the following behaviors related to eating: Poor awareness of internal cues of hunger and satiety, or fullness. Inability to follow a meal plan. Inability to judge portion size accurately. Inability to stop bingeing or purging. Distraction by continual thoughts of food, weight and body shape. Increased desire to overeat, especially high calorie, "reward" type foods. Poor self-esteem due to repeated failures of  self-control.  Plan: Increase Vyvanse to 60 mg daily, as per below.  - Increase Lisdexamfetamine Dimesylate (VYVANSE) 60 MG CHEW; Chew 60 mg by mouth daily.  Dispense: 30 tablet; Refill: 0  I have consulted the [place] Controlled Substances Registry for this patient, and feel the risk/benefit ratio today is favorable for proceeding with this prescription for a controlled substance. The patient understands monitoring parameters and red flags.   3. GAD (generalized anxiety disorder) [name] is taking propranolol 20 mg three times daily as needed for anxiety.  She stopped taking her Lexapro recently but is interested in restarting.  Plan:  Continue propranolol.  Restart Lexapro 5 mg daily, as per below.  - Restart escitalopram (LEXAPRO) 5 MG tablet; Take 1 tablet (5 mg total) by mouth daily.  Dispense: 30 tablet; Refill: 0  4. Obesity: Current BMI 43.5  Course: Andrea Colon is currently in the action stage of change. As such, her goal is to continue with weight loss efforts.   Nutrition goals: She has agreed to keeping a food journal and adhering to recommended goals of 1750-1900 calories and 100 grams of protein.   Exercise goals:  Increase NEAT activities.  Behavioral modification strategies: increasing lean protein intake, decreasing simple carbohydrates, increasing vegetables, and increasing water intake.  [name] has agreed to follow-up with our clinic in 4 weeks. She was informed of the importance of frequent follow-up visits to maximize her success with intensive lifestyle modifications for her multiple health conditions.   Objective:   Blood pressure 96/70, pulse (!) 114, temperature 98.2 F (36.8 C), temperature source Oral, height 5\' 4"  (1.626 m), weight 253 lb (114.8  kg), SpO2 98 %. Body mass index is 43.43 kg/m.  General: Cooperative, alert, well developed, in no acute distress. HEENT: Conjunctivae and lids unremarkable. Cardiovascular: Regular rhythm.  Lungs: Normal work of  breathing. Neurologic: No focal deficits.   Lab Results  Component Value Date   CREATININE 0.80 05/22/2021   BUN 10 05/22/2021   NA 136 05/22/2021   K 3.8 05/22/2021   CL 99 05/22/2021   CO2 28 05/22/2021   Lab Results  Component Value Date   ALT 15 05/22/2021   AST 14 05/22/2021   ALKPHOS 37 (L) 05/22/2021   BILITOT 0.4 05/22/2021   Lab Results  Component Value Date   HGBA1C 6.1 05/22/2021   HGBA1C 6.3 02/08/2020   HGBA1C 6.4 (A) 10/01/2018   HGBA1C 6.1 04/09/2016   HGBA1C 5.5 05/14/2011   Lab Results  Component Value Date   INSULIN 56.1 (H) 05/31/2021   INSULIN 35.1 (H) 04/26/2020   Lab Results  Component Value Date   TSH 2.440 05/31/2021   Lab Results  Component Value Date   CHOL 137 05/22/2021   HDL 31.70 (L) 05/22/2021   LDLCALC 61 10/01/2018   LDLDIRECT 81.0 05/22/2021   TRIG 222.0 (H) 05/22/2021   CHOLHDL 4 05/22/2021   Lab Results  Component Value Date   VD25OH 23.88 (L) 05/22/2021   VD25OH 28.3 (L) 04/26/2020   Lab Results  Component Value Date   WBC 9.0 05/31/2021   HGB 12.5 05/31/2021   HCT 37.4 05/31/2021   MCV 85 05/31/2021   PLT 335 05/31/2021   Lab Results  Component Value Date   FERRITIN 24 01/21/2017   Attestation Statements:   Reviewed by clinician on day of visit: allergies, medications, problem list, medical history, surgical history, family history, social history, and previous encounter notes.  I, Water quality scientist, CMA, am acting as transcriptionist for Briscoe Deutscher, DO  I have reviewed the above documentation for accuracy and completeness, and I agree with the above. - Briscoe Deutscher, DO, MS, FAAFP, DABOM - Family and Bariatric Medicine.

## 2021-12-25 NOTE — Progress Notes (Signed)
Call pt. If she is interested in me taking over her Klonopin and Ambien prescriptions in setting of restarting the Lexapro ( per Dr. Juleen China notes)... have her make an appt  for  GAD.

## 2021-12-25 NOTE — Progress Notes (Signed)
Left message for Hser to return my call about Dr. Diona Browner managing her medications per Dr. Alcario Drought office note.

## 2021-12-26 ENCOUNTER — Encounter: Payer: Self-pay | Admitting: *Deleted

## 2021-12-26 NOTE — Progress Notes (Signed)
My Chart message also sent to patient in regards to scheduling visit if she is interested in Dr. Diona Browner taking over medication management of her Klonopin, Ambien and Lexapro.

## 2021-12-31 ENCOUNTER — Other Ambulatory Visit: Payer: Self-pay

## 2021-12-31 ENCOUNTER — Ambulatory Visit (INDEPENDENT_AMBULATORY_CARE_PROVIDER_SITE_OTHER): Payer: 59 | Admitting: Family Medicine

## 2021-12-31 ENCOUNTER — Encounter (INDEPENDENT_AMBULATORY_CARE_PROVIDER_SITE_OTHER): Payer: Self-pay | Admitting: Family Medicine

## 2021-12-31 VITALS — BP 109/73 | HR 86 | Temp 97.9°F | Ht 64.0 in | Wt 250.0 lb

## 2021-12-31 DIAGNOSIS — Z6841 Body Mass Index (BMI) 40.0 and over, adult: Secondary | ICD-10-CM

## 2021-12-31 DIAGNOSIS — F411 Generalized anxiety disorder: Secondary | ICD-10-CM

## 2021-12-31 DIAGNOSIS — E669 Obesity, unspecified: Secondary | ICD-10-CM | POA: Diagnosis not present

## 2021-12-31 DIAGNOSIS — F5081 Binge eating disorder: Secondary | ICD-10-CM | POA: Diagnosis not present

## 2021-12-31 DIAGNOSIS — E559 Vitamin D deficiency, unspecified: Secondary | ICD-10-CM

## 2022-01-01 ENCOUNTER — Encounter (INDEPENDENT_AMBULATORY_CARE_PROVIDER_SITE_OTHER): Payer: Self-pay | Admitting: Family Medicine

## 2022-01-01 ENCOUNTER — Ambulatory Visit (INDEPENDENT_AMBULATORY_CARE_PROVIDER_SITE_OTHER): Payer: 59 | Admitting: Family Medicine

## 2022-01-01 ENCOUNTER — Telehealth: Payer: Self-pay | Admitting: Family Medicine

## 2022-01-01 ENCOUNTER — Encounter: Payer: Self-pay | Admitting: Family Medicine

## 2022-01-01 VITALS — BP 120/90 | HR 75 | Temp 98.0°F | Ht 64.0 in | Wt 252.2 lb

## 2022-01-01 DIAGNOSIS — F411 Generalized anxiety disorder: Secondary | ICD-10-CM | POA: Diagnosis not present

## 2022-01-01 DIAGNOSIS — F330 Major depressive disorder, recurrent, mild: Secondary | ICD-10-CM

## 2022-01-01 DIAGNOSIS — F902 Attention-deficit hyperactivity disorder, combined type: Secondary | ICD-10-CM

## 2022-01-01 MED ORDER — AMPHETAMINE-DEXTROAMPHETAMINE 5 MG PO TABS: 5.0000 mg | ORAL_TABLET | Freq: Every day | ORAL | 0 refills | Status: DC

## 2022-01-01 MED ORDER — VYVANSE 40 MG PO CHEW: 40.0000 mg | CHEWABLE_TABLET | Freq: Every day | ORAL | 0 refills | Status: DC

## 2022-01-01 MED ORDER — VITAMIN D (ERGOCALCIFEROL) 1.25 MG (50000 UNIT) PO CAPS: 50000.0000 [IU] | ORAL_CAPSULE | ORAL | 0 refills | Status: DC

## 2022-01-01 NOTE — Telephone Encounter (Signed)
Referral sent 

## 2022-01-01 NOTE — Progress Notes (Signed)
Patient ID: Andrea Colon, female    DOB: 03-09-1976, 46 y.o.   MRN: [id number]  This visit was conducted in person.  BP 120/90    Pulse 75    Temp 98 F (36.7 C) (Temporal)    Ht 5\' 4"  (1.626 m)    Wt 252 lb 4 oz (114.4 kg)    LMP 12/21/2021    SpO2 97%    BMI 43.30 kg/m    CC: Chief Complaint  Patient presents with   Depression    Take over Medication Management   Anxiety   Subjective:   HPI: Andrea Colon is a 46 y.o. female presenting on 01/01/2022 for Depression (Take over Medication Management) and Anxiety  MDD, GAD,  chronic recurrent as well as social phobia.  She was seeing psychiatry in the past but was not pleased with care. Was on  Cymbalta then prozac, sonata and clonazepam  Recently  12/13/2021 Dr. Juleen China restarted her on 5 mg daily ( she never started) for her symptoms. ( 10 mg made her sleepy)  Plans to change vyvanse as of appt yesterday to 40 mg with adderall booster to help it last longer.  She is also using propranolol TID for her heart racing symptoms   She is using Vyvanse for binge eating disorder and ADHD. Dr. Juleen China will continue to prescribe this.  Carried is interested in restarting clonazepam 0.5 mg at bedtime that she was on previously as it healped her anxiety and insomnia. She reports anger episodes.  She is trying to get back into counseling.  GAD: 9 PHQ 9:10 Relevant past medical, surgical, family and social history reviewed and updated as indicated. Interim medical history since our last visit reviewed. Allergies and medications reviewed and updated. Outpatient Medications Prior to Visit  Medication Sig Dispense Refill   clonazePAM (KLONOPIN) 0.5 MG tablet Take 1 tablet (0.5 mg total) by mouth at bedtime. 30 tablet 0   escitalopram (LEXAPRO) 5 MG tablet Take 1 tablet (5 mg total) by mouth daily. 30 tablet 0   Lisdexamfetamine Dimesylate (VYVANSE) 60 MG CHEW Chew 60 mg by mouth daily. 30 tablet 0   PARAGARD INTRAUTERINE COPPER IU 1  Device by Intrauterine route once.      propranolol (INDERAL) 20 MG tablet Take 1 tablet (20 mg total) by mouth 3 (three) times daily as needed. 30 tablet 3   spironolactone (ALDACTONE) 50 MG tablet Take 50 mg by mouth 2 (two) times daily.      Vitamin D, Ergocalciferol, (DRISDOL) 1.25 MG (50000 UNIT) CAPS capsule Take 1 capsule (50,000 Units total) by mouth every 7 (seven) days. 12 capsule 0   zaleplon (SONATA) 5 MG capsule Take 10 mg by mouth at bedtime.     No facility-administered medications prior to visit.     Per HPI unless specifically indicated in ROS section below Review of Systems  Constitutional:  Negative for fatigue and fever.  HENT:  Negative for congestion.   Eyes:  Negative for pain.  Respiratory:  Negative for cough and shortness of breath.   Cardiovascular:  Negative for chest pain, palpitations and leg swelling.  Gastrointestinal:  Negative for abdominal pain.  Genitourinary:  Negative for dysuria and vaginal bleeding.  Musculoskeletal:  Negative for back pain.  Neurological:  Negative for syncope, light-headedness and headaches.  Psychiatric/Behavioral:  Negative for dysphoric mood.   Objective:  BP 120/90    Pulse 75    Temp 98 F (36.7 C) (Temporal)  Ht 5\' 4"  (1.626 m)    Wt 252 lb 4 oz (114.4 kg)    LMP 12/21/2021    SpO2 97%    BMI 43.30 kg/m   Wt Readings from Last 3 Encounters:  01/01/22 252 lb 4 oz (114.4 kg)  12/31/21 250 lb (113.4 kg)  12/13/21 253 lb (114.8 kg)      Physical Exam Constitutional:      General: She is not in acute distress.    Appearance: Normal appearance. She is well-developed. She is obese. She is not ill-appearing or toxic-appearing.  HENT:     Head: Normocephalic.     Right Ear: Hearing, tympanic membrane, ear canal and external ear normal. Tympanic membrane is not erythematous, retracted or bulging.     Left Ear: Hearing, tympanic membrane, ear canal and external ear normal. Tympanic membrane is not erythematous, retracted or  bulging.     Nose: No mucosal edema or rhinorrhea.     Right Sinus: No maxillary sinus tenderness or frontal sinus tenderness.     Left Sinus: No maxillary sinus tenderness or frontal sinus tenderness.     Mouth/Throat:     Pharynx: Uvula midline.  Eyes:     General: Lids are normal. Lids are everted, no foreign bodies appreciated.     Conjunctiva/sclera: Conjunctivae normal.     Pupils: Pupils are equal, round, and reactive to light.  Neck:     Thyroid: No thyroid mass or thyromegaly.     Vascular: No carotid bruit.     Trachea: Trachea normal.  Cardiovascular:     Rate and Rhythm: Normal rate and regular rhythm.     Pulses: Normal pulses.     Heart sounds: Normal heart sounds, S1 normal and S2 normal. No murmur heard.   No friction rub. No gallop.  Pulmonary:     Effort: Pulmonary effort is normal. No tachypnea or respiratory distress.     Breath sounds: Normal breath sounds. No decreased breath sounds, wheezing, rhonchi or rales.  Abdominal:     General: Bowel sounds are normal.     Palpations: Abdomen is soft.     Tenderness: There is no abdominal tenderness.  Musculoskeletal:     Cervical back: Normal range of motion and neck supple.  Skin:    General: Skin is warm and dry.     Findings: No rash.  Neurological:     Mental Status: She is alert.  Psychiatric:        Mood and Affect: Mood is not anxious or depressed.        Speech: Speech normal.        Behavior: Behavior normal. Behavior is cooperative.        Thought Content: Thought content normal.        Judgment: Judgment normal.      Results for orders placed or performed in visit on 05/31/21  Vitamin B12  Result Value Ref Range   Vitamin B-12 690 232 - 1,245 pg/mL  CBC with Differential/Platelet  Result Value Ref Range   WBC 9.0 3.4 - 10.8 x10E3/uL   RBC 4.42 3.77 - 5.28 x10E6/uL   Hemoglobin 12.5 11.1 - 15.9 g/dL   Hematocrit 37.4 34.0 - 46.6 %   MCV 85 79 - 97 fL   MCH 28.3 26.6 - 33.0 pg   MCHC 33.4  31.5 - 35.7 g/dL   RDW 14.6 11.7 - 15.4 %   Platelets 335 150 - 450 x10E3/uL   Neutrophils 69 Not Estab. %  Lymphs 22 Not Estab. %   Monocytes 5 Not Estab. %   Eos 3 Not Estab. %   Basos 1 Not Estab. %   Neutrophils Absolute 6.3 1.4 - 7.0 x10E3/uL   Lymphocytes Absolute 2.0 0.7 - 3.1 x10E3/uL   Monocytes Absolute 0.5 0.1 - 0.9 x10E3/uL   EOS (ABSOLUTE) 0.2 0.0 - 0.4 x10E3/uL   Basophils Absolute 0.1 0.0 - 0.2 x10E3/uL   Immature Granulocytes 0 Not Estab. %   Immature Grans (Abs) 0.0 0.0 - 0.1 x10E3/uL  Folate  Result Value Ref Range   Folate 7.7 >3.0 ng/mL  Insulin, random  Result Value Ref Range   INSULIN 56.1 (H) 2.6 - 24.9 uIU/mL  T3  Result Value Ref Range   T3, Total 175 71 - 180 ng/dL  T4  Result Value Ref Range   T4, Total 8.3 4.5 - 12.0 ug/dL  TSH  Result Value Ref Range   TSH 2.440 0.450 - 4.500 uIU/mL    This visit occurred during the SARS-CoV-2 public health emergency.  Safety protocols were in place, including screening questions prior to the visit, additional usage of staff PPE, and extensive cleaning of exam room while observing appropriate contact time as indicated for disinfecting solutions.   COVID 19 screen:  No recent travel or known exposure to COVID19 The patient denies respiratory symptoms of COVID 19 at this time. The importance of social distancing was discussed today.   Assessment and Plan   Problem List Items Addressed This Visit     Depression, major, recurrent, mild (HCC) (Chronic)   Other Visit Diagnoses     GAD (generalized anxiety disorder)    -  Primary   Attention deficit hyperactivity disorder (ADHD), combined type         Continue clonazepam daily for anxiety. Use propranolol up to three times a day medication for additional anxiety.  Continue sonata at night for sleep.  Start trial of change in dose of vyvanse and adderall later in the day.  Consider using your skill with writing to help with stress.  Call to get back into  counseling with son as well.  Eliezer Lofts, MD

## 2022-01-01 NOTE — Patient Instructions (Addendum)
Continue clonazepam daily for anxiety. Use propranolol up to three times a day medication for additional anxiety.  Continue sonata at night for sleep.  Start trial of change in dose of vyvanse and adderall later in the day.  Consider using your skill with writing to help with stress.  Call to get back into counseling with son as well.

## 2022-01-04 ENCOUNTER — Other Ambulatory Visit: Payer: Self-pay | Admitting: Family Medicine

## 2022-01-04 ENCOUNTER — Encounter: Payer: Self-pay | Admitting: Family Medicine

## 2022-01-04 DIAGNOSIS — Z862 Personal history of diseases of the blood and blood-forming organs and certain disorders involving the immune mechanism: Secondary | ICD-10-CM

## 2022-01-04 DIAGNOSIS — R42 Dizziness and giddiness: Secondary | ICD-10-CM

## 2022-01-11 ENCOUNTER — Encounter: Payer: Self-pay | Admitting: Family Medicine

## 2022-01-11 ENCOUNTER — Other Ambulatory Visit: Payer: Self-pay

## 2022-01-11 ENCOUNTER — Other Ambulatory Visit (INDEPENDENT_AMBULATORY_CARE_PROVIDER_SITE_OTHER): Payer: 59

## 2022-01-11 DIAGNOSIS — R42 Dizziness and giddiness: Secondary | ICD-10-CM | POA: Diagnosis not present

## 2022-01-11 DIAGNOSIS — Z862 Personal history of diseases of the blood and blood-forming organs and certain disorders involving the immune mechanism: Secondary | ICD-10-CM

## 2022-01-11 LAB — IBC + FERRITIN
Ferritin: 19.7 ng/mL (ref 10.0–291.0)
Iron: 38 ug/dL — ABNORMAL LOW (ref 42–145)
Saturation Ratios: 8.9 % — ABNORMAL LOW (ref 20.0–50.0)
TIBC: 425.6 ug/dL (ref 250.0–450.0)
Transferrin: 304 mg/dL (ref 212.0–360.0)

## 2022-01-11 LAB — BASIC METABOLIC PANEL
BUN: 11 mg/dL (ref 6–23)
CO2: 27 mEq/L (ref 19–32)
Calcium: 9.1 mg/dL (ref 8.4–10.5)
Chloride: 99 mEq/L (ref 96–112)
Creatinine, Ser: 0.77 mg/dL (ref 0.40–1.20)
GFR: 92.6 mL/min (ref >60.00)
Glucose, Bld: 108 mg/dL — ABNORMAL HIGH (ref 70–99)
Potassium: 3.8 mEq/L (ref 3.5–5.1)
Sodium: 136 mEq/L (ref 135–145)

## 2022-01-11 LAB — VITAMIN D 25 HYDROXY (VIT D DEFICIENCY, FRACTURES): VITD: 38.8 ng/mL (ref 30.00–100.00)

## 2022-01-11 LAB — CBC WITH DIFFERENTIAL/PLATELET
Basophils Absolute: 0 10*3/uL (ref 0.0–0.1)
Basophils Relative: 0.4 % (ref 0.0–3.0)
Eosinophils Absolute: 0.2 10*3/uL (ref 0.0–0.7)
Eosinophils Relative: 2 % (ref 0.0–5.0)
HCT: 38.9 % (ref 36.0–46.0)
Hemoglobin: 12.8 g/dL (ref 12.0–15.0)
Lymphocytes Relative: 18.2 % (ref 12.0–46.0)
Lymphs Abs: 1.9 10*3/uL (ref 0.7–4.0)
MCHC: 33 g/dL (ref 30.0–36.0)
MCV: 81 fl (ref 78.0–100.0)
Monocytes Absolute: 0.5 10*3/uL (ref 0.1–1.0)
Monocytes Relative: 5.2 % (ref 3.0–12.0)
Neutro Abs: 7.5 10*3/uL (ref 1.4–7.7)
Neutrophils Relative %: 74.2 % (ref 43.0–77.0)
Platelets: 362 10*3/uL (ref 150.0–400.0)
RBC: 4.81 Mil/uL (ref 3.87–5.11)
RDW: 15.4 % (ref 11.5–15.5)
WBC: 10.2 10*3/uL (ref 4.0–10.5)

## 2022-01-11 LAB — TSH: TSH: 3.18 u[IU]/mL (ref 0.35–5.50)

## 2022-01-11 LAB — VITAMIN B12: Vitamin B-12: 419 pg/mL (ref 211–911)

## 2022-01-14 NOTE — Progress Notes (Signed)
Chief Complaint:   OBESITY Andrea Colon is here to discuss her progress with her obesity treatment plan along with follow-up of her obesity related diagnoses. See Medical Weight Management Flowsheet for complete bioelectrical impedance results.  Today's visit was #: 11 Starting weight: 269 lbs Starting date: 05/31/2021 Weight change since last visit: 3 lbs Total lbs lost to date: 19 lbs Total weight loss percentage to date: -7.06%  Nutrition Plan: Keeping a food journal and adhering to recommended goals of 1750-1900 calories and 100 grams of protein daily for 10% of the time. Activity: None.  Interim History: Andrea Colon will see her PCP tomorrow.  She says she likes the Vyvanse (split dose lasts ~5 hours).  She never started the Lexapro.  Assessment/Plan:   1. Vitamin D deficiency Not at goal. She is taking vitamin D 50,000 IU weekly.  Plan: Continue to take prescription Vitamin D @50 ,000 IU every week as prescribed.  Follow-up for routine testing of Vitamin D, at least 2-3 times per year to avoid over-replacement.  Lab Results  Component Value Date   VD25OH 38.80 01/11/2022   VD25OH 23.88 (L) 05/22/2021   VD25OH 28.3 (L) 04/26/2020   - Refill Vitamin D, Ergocalciferol, (DRISDOL) 1.25 MG (50000 UNIT) CAPS capsule; Take 1 capsule (50,000 Units total) by mouth every 7 (seven) days.  Dispense: 12 capsule; Refill: 0  2. Binge eating disorder, with ADHD Andrea Colon is taking Vyvanse 60 mg daily for BED.  Plan:  Decrease Vyvanse to 40 mg daily to take in the afternoon. Start Adderall 5 mg daily, to take in the morning.  The goals for treatment of BED are to reduce eating binges and to achieve healthy eating habits. Because binge eating can correlate with negative emotions, treatment may also address any other mental-health issues, such as depression. People who binge eat feel as if they don't have control over how much they eat and have feelings of guilt or self-loathing after a binge eating  episode.  The FDA has approved Vyvanse as a treatment option for binge eating disorder. Vyvanse targets the brain's reward center by increasing the levels of dopamine and norepinephrine, the chemicals of the brain responsible for feelings of pleasure.  Mindful eating is the recommended nutritional approach to treating BED.   - Decrease Lisdexamfetamine Dimesylate (VYVANSE) 40 MG CHEW; Chew 40 mg by mouth daily at 12 noon.  Dispense: 30 tablet; Refill: 0 - Start amphetamine-dextroamphetamine (ADDERALL) 5 MG tablet; Take 1 tablet (5 mg total) by mouth daily.  Dispense: 30 tablet; Refill: 0  3. GAD (generalized anxiety disorder) Andrea Colon is taking propranolol 20 mg three times daily as needed for anxiety.  She did not start Lexapro. Discussed cues and consequences, how thoughts affect eating, model of thoughts, feelings, and behaviors, and strategies for change by focusing on the cue. Discussed cognitive distortions, coping thoughts, and how to change your thoughts.   Plan:  Continue propranolol.  4. Obesity: Current BMI 42.9  Course: Andrea Colon is currently in the action stage of change. As such, her goal is to continue with weight loss efforts.   Nutrition goals: She has agreed to keeping a food journal and adhering to recommended goals of 1750-1900 calories and 100 grams of protein.   Exercise goals:  As is.  Behavioral modification strategies: increasing lean protein intake, decreasing simple carbohydrates, increasing vegetables, increasing water intake, and decreasing liquid calories.  Andrea Colon has agreed to follow-up with our clinic in 3 weeks. She was informed of the importance of frequent  follow-up visits to maximize her success with intensive lifestyle modifications for her multiple health conditions.   Objective:   Blood pressure 109/73, pulse 86, temperature 97.9 F (36.6 C), temperature source Oral, height 5\' 4"  (1.626 m), weight 250 lb (113.4 kg), last menstrual period 12/21/2021, SpO2  97 %. Body mass index is 42.91 kg/m.  General: Cooperative, alert, well developed, in no acute distress. HEENT: Conjunctivae and lids unremarkable. Cardiovascular: Regular rhythm.  Lungs: Normal work of breathing. Neurologic: No focal deficits.   Lab Results  Component Value Date   CREATININE 0.77 01/11/2022   BUN 11 01/11/2022   NA 136 01/11/2022   K 3.8 01/11/2022   CL 99 01/11/2022   CO2 27 01/11/2022   Lab Results  Component Value Date   ALT 15 05/22/2021   AST 14 05/22/2021   ALKPHOS 37 (L) 05/22/2021   BILITOT 0.4 05/22/2021   Lab Results  Component Value Date   HGBA1C 6.1 05/22/2021   HGBA1C 6.3 02/08/2020   HGBA1C 6.4 (A) 10/01/2018   HGBA1C 6.1 04/09/2016   HGBA1C 5.5 05/14/2011   Lab Results  Component Value Date   INSULIN 56.1 (H) 05/31/2021   INSULIN 35.1 (H) 04/26/2020   Lab Results  Component Value Date   TSH 3.18 01/11/2022   Lab Results  Component Value Date   CHOL 137 05/22/2021   HDL 31.70 (L) 05/22/2021   LDLCALC 61 10/01/2018   LDLDIRECT 81.0 05/22/2021   TRIG 222.0 (H) 05/22/2021   CHOLHDL 4 05/22/2021   Lab Results  Component Value Date   VD25OH 38.80 01/11/2022   VD25OH 23.88 (L) 05/22/2021   VD25OH 28.3 (L) 04/26/2020   Lab Results  Component Value Date   WBC 10.2 01/11/2022   HGB 12.8 01/11/2022   HCT 38.9 01/11/2022   MCV 81.0 01/11/2022   PLT 362.0 01/11/2022   Lab Results  Component Value Date   IRON 38 (L) 01/11/2022   TIBC 425.6 01/11/2022   FERRITIN 19.7 01/11/2022   Attestation Statements:   Reviewed by clinician on day of visit: allergies, medications, problem list, medical history, surgical history, family history, social history, and previous encounter notes.  Time spent on visit including pre-visit chart review and post-visit care and documentation was 43 minutes. Time was spent on: Food choices and timing of food intake reviewed today. I discussed a personalized meal plan with the patient that will help  her to lose weight and will improve her obesity-related conditions going forward. I performed a medically necessary appropriate examination and/or evaluation. I discussed the assessment and treatment plan with the patient. Motivational interviewing as well as evidence-based interventions for health behavior change were utilized today including the discussion of self monitoring techniques, problem-solving barriers and SMART goal setting techniques.  An exercise prescription was reviewed.  The patient was provided an opportunity to ask questions and all were answered. The patient agreed with the plan and demonstrated an understanding of the instructions. Clinical information was updated and documented in the EMR.  I, Water quality scientist, CMA, am acting as transcriptionist for Briscoe Deutscher, DO  I have reviewed the above documentation for accuracy and completeness, and I agree with the above. -  Briscoe Deutscher, DO, MS, FAAFP, DABOM - Family and Bariatric Medicine.

## 2022-01-23 ENCOUNTER — Encounter (INDEPENDENT_AMBULATORY_CARE_PROVIDER_SITE_OTHER): Payer: Self-pay | Admitting: Family Medicine

## 2022-01-23 ENCOUNTER — Encounter: Payer: Self-pay | Admitting: Family Medicine

## 2022-01-24 NOTE — Telephone Encounter (Signed)
LOV w/ Wallace

## 2022-01-29 ENCOUNTER — Encounter: Payer: Self-pay | Admitting: Family Medicine

## 2022-01-29 ENCOUNTER — Other Ambulatory Visit: Payer: Self-pay

## 2022-01-29 ENCOUNTER — Ambulatory Visit (INDEPENDENT_AMBULATORY_CARE_PROVIDER_SITE_OTHER): Payer: 59 | Admitting: Family Medicine

## 2022-01-29 VITALS — BP 120/78 | HR 98 | Temp 98.7°F | Ht 64.0 in | Wt 256.2 lb

## 2022-01-29 DIAGNOSIS — F411 Generalized anxiety disorder: Secondary | ICD-10-CM

## 2022-01-29 DIAGNOSIS — R42 Dizziness and giddiness: Secondary | ICD-10-CM

## 2022-01-29 DIAGNOSIS — E611 Iron deficiency: Secondary | ICD-10-CM | POA: Insufficient documentation

## 2022-01-29 DIAGNOSIS — F330 Major depressive disorder, recurrent, mild: Secondary | ICD-10-CM

## 2022-01-29 DIAGNOSIS — F5104 Psychophysiologic insomnia: Secondary | ICD-10-CM

## 2022-01-29 DIAGNOSIS — I493 Ventricular premature depolarization: Secondary | ICD-10-CM

## 2022-01-29 DIAGNOSIS — I10 Essential (primary) hypertension: Secondary | ICD-10-CM | POA: Diagnosis not present

## 2022-01-29 NOTE — Patient Instructions (Addendum)
Continue iron.. we will recheck 1 month.. iron panel.  Increase daytime activity.  Check  BP at home.. call if greater than 140/90. Call if palpitations return.

## 2022-01-29 NOTE — Assessment & Plan Note (Signed)
Follow blood pressure now off propranolol and spironolactone.

## 2022-01-29 NOTE — Assessment & Plan Note (Signed)
If these recur off propranolol.. consider restart.

## 2022-01-29 NOTE — Assessment & Plan Note (Signed)
Worsened control despite lexapro 5 mg...  She has not yet been on this 4 weeks.. will give it more time and re-eval in 1 month.  She will keep appt with counselor.

## 2022-01-29 NOTE — Assessment & Plan Note (Signed)
Replete and recheck in 1-2 months.

## 2022-01-29 NOTE — Progress Notes (Addendum)
Patient ID: [name], female    DOB: [birth year], 46 y.o.   MRN: [id number]  This visit was conducted in person.  BP 120/78    Pulse 98    Temp 98.7 F (37.1 C) (Temporal)    Ht 5\' 4"  (1.626 m)    Wt 256 lb 4 oz (116.2 kg)    SpO2 95%    BMI 43.99 kg/m    CC:  Chief Complaint  Patient presents with   Follow-up    MDD/GAD     Subjective:   HPI: NEIVA MAENZA is a 46 y.o. female presenting on 01/29/2022 for Follow-up (MDD/GAD)  MDD/GAD,  ADD:  She reports in the last 4 weeks she has reestablished with a counselor (but first appt in April) and has started lexapro 5 mg daily. She has been on the lexapro since 1/19.2023 No SE.   She was off vyvanse for a while and restarted it 2 days ago. She notes when she was not on this this she had more cravings. Using clonazepam daily for anxiety prn. She is using sonata at night for insomnia. She is able to fall asleep. Able to sleep 5 hours at night.  Wt Readings from Last 3 Encounters:  01/29/22 256 lb 4 oz (116.2 kg)  01/01/22 252 lb 4 oz (114.4 kg)  12/31/21 250 lb (113.4 kg)    She feel she has had SE due to the spironolactone and and is weaning off this. It was initially started for hair loss.  She feels that she feels better off of the propranolol.Marland Kitchen less  concentration issues, dizzy and lightheaded.   She has an appt with Dr. Juleen China tommorow. BP Readings from Last 3 Encounters:  01/29/22 120/78  01/01/22 120/90  12/31/21 109/73    GAD 7: 13 PHQ9 : 16.   She has started ferrous sulfate  65 mg and 20 mg  iron repair iron.  No constipation.   She did have COVID in last 3-4 weeks... this increased anxiety.      Relevant past medical, surgical, family and social history reviewed and updated as indicated. Interim medical history since our last visit reviewed. Allergies and medications reviewed and updated. Outpatient Medications Prior to Visit  Medication Sig Dispense Refill   amphetamine-dextroamphetamine  (ADDERALL) 5 MG tablet Take 1 tablet (5 mg total) by mouth daily. 30 tablet 0   clonazePAM (KLONOPIN) 0.5 MG tablet Take 1 tablet (0.5 mg total) by mouth at bedtime. 30 tablet 0   escitalopram (LEXAPRO) 5 MG tablet Take 1 tablet (5 mg total) by mouth daily. 30 tablet 0   Lisdexamfetamine Dimesylate (VYVANSE) 40 MG CHEW Chew 40 mg by mouth daily at 12 noon. 30 tablet 0   PARAGARD INTRAUTERINE COPPER IU 1 Device by Intrauterine route once.      propranolol (INDERAL) 20 MG tablet Take 1 tablet (20 mg total) by mouth 3 (three) times daily as needed. 30 tablet 3   spironolactone (ALDACTONE) 50 MG tablet Take 50 mg by mouth 2 (two) times daily.      Vitamin D, Ergocalciferol, (DRISDOL) 1.25 MG (50000 UNIT) CAPS capsule Take 1 capsule (50,000 Units total) by mouth every 7 (seven) days. 12 capsule 0   zaleplon (SONATA) 5 MG capsule Take 10 mg by mouth at bedtime.     No facility-administered medications prior to visit.     Per HPI unless specifically indicated in ROS section below Review of Systems  Constitutional:  Negative for fatigue and  fever.  HENT:  Negative for congestion and ear pain.   Eyes:  Negative for pain.  Respiratory:  Negative for cough, chest tightness and shortness of breath.   Cardiovascular:  Negative for chest pain, palpitations and leg swelling.  Gastrointestinal:  Negative for abdominal pain.  Genitourinary:  Negative for dysuria and vaginal bleeding.  Musculoskeletal:  Negative for back pain.  Neurological:  Negative for syncope, light-headedness and headaches.  Psychiatric/Behavioral:  Negative for dysphoric mood. The patient is nervous/anxious.   Objective:  BP 120/78    Pulse 98    Temp 98.7 F (37.1 C) (Temporal)    Ht 5\' 4"  (1.626 m)    Wt 256 lb 4 oz (116.2 kg)    SpO2 95%    BMI 43.99 kg/m   Wt Readings from Last 3 Encounters:  01/29/22 256 lb 4 oz (116.2 kg)  01/01/22 252 lb 4 oz (114.4 kg)  12/31/21 250 lb (113.4 kg)      Physical Exam Constitutional:       General: She is not in acute distress.    Appearance: Normal appearance. She is well-developed. She is not ill-appearing or toxic-appearing.  HENT:     Head: Normocephalic.     Right Ear: Hearing, tympanic membrane, ear canal and external ear normal. Tympanic membrane is not erythematous, retracted or bulging.     Left Ear: Hearing, tympanic membrane, ear canal and external ear normal. Tympanic membrane is not erythematous, retracted or bulging.     Nose: No mucosal edema or rhinorrhea.     Right Sinus: No maxillary sinus tenderness or frontal sinus tenderness.     Left Sinus: No maxillary sinus tenderness or frontal sinus tenderness.     Mouth/Throat:     Pharynx: Uvula midline.  Eyes:     General: Lids are normal. Lids are everted, no foreign bodies appreciated.     Conjunctiva/sclera: Conjunctivae normal.     Pupils: Pupils are equal, round, and reactive to light.  Neck:     Thyroid: No thyroid mass or thyromegaly.     Vascular: No carotid bruit.     Trachea: Trachea normal.  Cardiovascular:     Rate and Rhythm: Normal rate and regular rhythm.     Pulses: Normal pulses.     Heart sounds: Normal heart sounds, S1 normal and S2 normal. No murmur heard.   No friction rub. No gallop.  Pulmonary:     Effort: Pulmonary effort is normal. No tachypnea or respiratory distress.     Breath sounds: Normal breath sounds. No decreased breath sounds, wheezing, rhonchi or rales.  Abdominal:     General: Bowel sounds are normal.     Palpations: Abdomen is soft.     Tenderness: There is no abdominal tenderness.  Musculoskeletal:     Cervical back: Normal range of motion and neck supple.  Skin:    General: Skin is warm and dry.     Findings: No rash.  Neurological:     Mental Status: She is alert.  Psychiatric:        Attention and Perception: Attention normal.        Mood and Affect: Mood is anxious. Mood is not depressed.        Speech: Speech is rapid and pressured.        Behavior:  Behavior is agitated. Behavior is cooperative.        Thought Content: Thought content normal.        Cognition and Memory: Cognition  normal.        Judgment: Judgment normal.      Results for orders placed or performed in visit on 01/11/22  VITAMIN D 25 Hydroxy (Vit-D Deficiency, Fractures)  Result Value Ref Range   VITD 38.80 30.00 - 100.00 ng/mL  TSH  Result Value Ref Range   TSH 3.18 0.35 - 5.50 uIU/mL  Vitamin B12  Result Value Ref Range   Vitamin B-12 419 211 - 911 pg/mL  Basic Metabolic Panel  Result Value Ref Range   Sodium 136 135 - 145 mEq/L   Potassium 3.8 3.5 - 5.1 mEq/L   Chloride 99 96 - 112 mEq/L   CO2 27 19 - 32 mEq/L   Glucose, Bld 108 (H) 70 - 99 mg/dL   BUN 11 6 - 23 mg/dL   Creatinine, Ser 0.77 0.40 - 1.20 mg/dL   GFR 92.60 >60.00 mL/min   Calcium 9.1 8.4 - 10.5 mg/dL  IBC + Ferritin  Result Value Ref Range   Iron 38 (L) 42 - 145 ug/dL   Transferrin 304.0 212.0 - 360.0 mg/dL   Saturation Ratios 8.9 (L) 20.0 - 50.0 %   Ferritin 19.7 10.0 - 291.0 ng/mL   TIBC 425.6 250.0 - 450.0 mcg/dL  CBC with Differential/Platelet  Result Value Ref Range   WBC 10.2 4.0 - 10.5 K/uL   RBC 4.81 3.87 - 5.11 Mil/uL   Hemoglobin 12.8 12.0 - 15.0 g/dL   HCT 38.9 36.0 - 46.0 %   MCV 81.0 78.0 - 100.0 fl   MCHC 33.0 30.0 - 36.0 g/dL   RDW 15.4 11.5 - 15.5 %   Platelets 362.0 150.0 - 400.0 K/uL   Neutrophils Relative % 74.2 43.0 - 77.0 %   Lymphocytes Relative 18.2 12.0 - 46.0 %   Monocytes Relative 5.2 3.0 - 12.0 %   Eosinophils Relative 2.0 0.0 - 5.0 %   Basophils Relative 0.4 0.0 - 3.0 %   Neutro Abs 7.5 1.4 - 7.7 K/uL   Lymphs Abs 1.9 0.7 - 4.0 K/uL   Monocytes Absolute 0.5 0.1 - 1.0 K/uL   Eosinophils Absolute 0.2 0.0 - 0.7 K/uL   Basophils Absolute 0.0 0.0 - 0.1 K/uL    This visit occurred during the SARS-CoV-2 public health emergency.  Safety protocols were in place, including screening questions prior to the visit, additional usage of staff PPE, and extensive  cleaning of exam room while observing appropriate contact time as indicated for disinfecting solutions.   COVID 19 screen:  No recent travel or known exposure to COVID19 The patient denies respiratory symptoms of COVID 19 at this time. The importance of social distancing was discussed today.   Assessment and Plan Problem List Items Addressed This Visit     Depression, major, recurrent, mild (HCC) (Chronic)    Worsened control despite lexapro 5 mg...  She has not yet been on this 4 weeks.. will give it more time and re-eval in 1 month.  She will keep appt with counselor.      Chronic insomnia    After clarification of medication.Marland KitchenMarland KitchenShe has been on both zaleplon and clonazepam at night for sleep/anxiety per her psychiatrist.  This is a high risk combination and we will work on weaning off one or both of these overtime hopefully as lexapro and counseling have an effect.      Essential hypertension, benign     Follow blood pressure now off propranolol and spironolactone.      Generalized anxiety disorder  Chronic, inadequate control... continue lexapro 5 mg daily as well as low dose clonazepam daily.      Low iron     Replete and recheck in 1-2 months.      PVC's (premature ventricular contractions)    If these recur off propranolol.. consider restart.      Other Visit Diagnoses     GAD (generalized anxiety disorder)    -  Primary   Dizziness             Eliezer Lofts, MD

## 2022-01-29 NOTE — Assessment & Plan Note (Signed)
Chronic, inadequate control... continue lexapro 5 mg daily as well as low dose clonazepam daily.

## 2022-01-29 NOTE — Assessment & Plan Note (Addendum)
After clarification of medication.Marland KitchenMarland KitchenShe has been on both zaleplon and clonazepam at night for sleep/anxiety per her psychiatrist.  This is a high risk combination and we will work on weaning off one or both of these overtime hopefully as lexapro and counseling have an effect.

## 2022-01-30 ENCOUNTER — Ambulatory Visit (INDEPENDENT_AMBULATORY_CARE_PROVIDER_SITE_OTHER): Payer: 59 | Admitting: Family Medicine

## 2022-01-30 ENCOUNTER — Encounter (INDEPENDENT_AMBULATORY_CARE_PROVIDER_SITE_OTHER): Payer: Self-pay | Admitting: Family Medicine

## 2022-01-30 VITALS — BP 128/84 | HR 91 | Temp 97.9°F | Ht 64.0 in | Wt 250.0 lb

## 2022-01-30 DIAGNOSIS — E559 Vitamin D deficiency, unspecified: Secondary | ICD-10-CM

## 2022-01-30 DIAGNOSIS — F5081 Binge eating disorder: Secondary | ICD-10-CM

## 2022-01-30 DIAGNOSIS — G4709 Other insomnia: Secondary | ICD-10-CM | POA: Diagnosis not present

## 2022-01-30 DIAGNOSIS — E669 Obesity, unspecified: Secondary | ICD-10-CM

## 2022-01-30 DIAGNOSIS — Z6841 Body Mass Index (BMI) 40.0 and over, adult: Secondary | ICD-10-CM

## 2022-01-30 DIAGNOSIS — F5104 Psychophysiologic insomnia: Secondary | ICD-10-CM

## 2022-01-30 DIAGNOSIS — F411 Generalized anxiety disorder: Secondary | ICD-10-CM

## 2022-01-30 MED ORDER — VITAMIN D (ERGOCALCIFEROL) 1.25 MG (50000 UNIT) PO CAPS: 50000.0000 [IU] | ORAL_CAPSULE | ORAL | 0 refills | Status: DC

## 2022-01-31 ENCOUNTER — Encounter (INDEPENDENT_AMBULATORY_CARE_PROVIDER_SITE_OTHER): Payer: Self-pay | Admitting: Family Medicine

## 2022-01-31 MED ORDER — LISDEXAMFETAMINE DIMESYLATE 40 MG PO CAPS: 40.0000 mg | ORAL_CAPSULE | Freq: Every day | ORAL | 0 refills | Status: DC

## 2022-01-31 MED ORDER — LISDEXAMFETAMINE DIMESYLATE 40 MG PO CAPS
40.0000 mg | ORAL_CAPSULE | Freq: Every day | ORAL | 0 refills | Status: DC
Start: 2022-03-31 — End: 2022-04-04

## 2022-01-31 NOTE — Progress Notes (Signed)
Chief Complaint:   OBESITY Andrea Colon is here to discuss her progress with her obesity treatment plan along with follow-up of her obesity related diagnoses. See Medical Weight Management Flowsheet for complete bioelectrical impedance results.  Today's visit was #: 12 Starting weight: 269 lbs Starting date: 05/31/2021 Weight change since last visit: 0 Total lbs lost to date: 19 lbs Total weight loss percentage to date: -7.06%  Nutrition Plan: Keeping a food journal and adhering to recommended goals of 1750-1900 calories and 100 grams of protein daily for 30% of the time. Activity: None.  Interim History: Andrea Colon had COVID last month.  She says that propranolol caused too much blockade, so she stopped it over the weekend.  She started Vyvanse over the weekend and says the 40 mg capsule works well.    Assessment/Plan:   1. Vitamin D deficiency Not at goal. She is taking vitamin D 50,000 IU weekly.  Plan: Continue to take prescription Vitamin D @50 ,000 IU every week as prescribed.  Follow-up for routine testing of Vitamin D, at least 2-3 times per year to avoid over-replacement.  Lab Results  Component Value Date   VD25OH 38.80 01/11/2022   VD25OH 23.88 (L) 05/22/2021   VD25OH 28.3 (L) 04/26/2020   - Refill Vitamin D, Ergocalciferol, (DRISDOL) 1.25 MG (50000 UNIT) CAPS capsule; Take 1 capsule (50,000 Units total) by mouth every 7 (seven) days.  Dispense: 12 capsule; Refill: 0  2. Chronic insomnia This is moderately controlled. Current treatment: Sonata 10 mg at bedtime.  Plan: Continue Sonata 10 mg at bedtime.  Recommend sleep hygiene measures including regular sleep schedule, optimal sleep environment, and relaxing presleep rituals.   3. Binge eating disorder, with ADHD Improving. Medication:  Andrea Colon is taking Vyvanse 40 mg daily.  Counseling ADHD is the most missed diagnosis in relation to food and appetite problems. Often the strong urge to binge or to self-medicate with food  subsides once the impulsivity and inattention of ADHD are treated. A person can experience a new ability to tune in to the body's signals, control cravings and improve impulse control.  A deficiency in norepinephrine and dopamine can lead to the following behaviors related to eating: Poor awareness of internal cues of hunger and satiety, or fullness. Inability to follow a meal plan. Inability to judge portion size accurately. Inability to stop bingeing or purging. Distraction by continual thoughts of food, weight and body shape. Increased desire to overeat, especially high calorie, "reward" type foods. Poor self-esteem due to repeated failures of self-control.  Plan: Continue Vyvanse 40 mg daily.  Will refill today for 3 months.  I have consulted the [place] Controlled Substances Registry for this patient, and feel the risk/benefit ratio today is favorable for proceeding with this prescription for a controlled substance. The patient understands monitoring parameters and red flags.   4. Generalized anxiety disorder Andrea Colon stopped taking propranolol over the weekend, but restarted Lexapro 5 mg for anxiety.  Plan:  Continue Lexapro 5 mg daily.  5. Obesity: Current BMI 43  Course: Andrea Colon is currently in the action stage of change. As such, her goal is to continue with weight loss efforts.   Nutrition goals: She has agreed to keeping a food journal and adhering to recommended goals of 1750-1900 calories and 100 grams of protein.   Exercise goals:  Increase NEAT.  Behavioral modification strategies: increasing lean protein intake, decreasing simple carbohydrates, and increasing vegetables.  Andrea Colon has agreed to follow-up with our clinic in 2 weeks. She was  informed of the importance of frequent follow-up visits to maximize her success with intensive lifestyle modifications for her multiple health conditions.   Objective:   Blood pressure 128/84, pulse 91, temperature 97.9 F (36.6 C),  temperature source Oral, height 5\' 4"  (1.626 m), weight 250 lb (113.4 kg), SpO2 98 %. Body mass index is 42.91 kg/m.  General: Cooperative, alert, well developed, in no acute distress. HEENT: Conjunctivae and lids unremarkable. Cardiovascular: Regular rhythm.  Lungs: Normal work of breathing. Neurologic: No focal deficits.   Lab Results  Component Value Date   CREATININE 0.77 01/11/2022   BUN 11 01/11/2022   NA 136 01/11/2022   K 3.8 01/11/2022   CL 99 01/11/2022   CO2 27 01/11/2022   Lab Results  Component Value Date   ALT 15 05/22/2021   AST 14 05/22/2021   ALKPHOS 37 (L) 05/22/2021   BILITOT 0.4 05/22/2021   Lab Results  Component Value Date   HGBA1C 6.1 05/22/2021   HGBA1C 6.3 02/08/2020   HGBA1C 6.4 (A) 10/01/2018   HGBA1C 6.1 04/09/2016   HGBA1C 5.5 05/14/2011   Lab Results  Component Value Date   INSULIN 56.1 (H) 05/31/2021   INSULIN 35.1 (H) 04/26/2020   Lab Results  Component Value Date   TSH 3.18 01/11/2022   Lab Results  Component Value Date   CHOL 137 05/22/2021   HDL 31.70 (L) 05/22/2021   LDLCALC 61 10/01/2018   LDLDIRECT 81.0 05/22/2021   TRIG 222.0 (H) 05/22/2021   CHOLHDL 4 05/22/2021   Lab Results  Component Value Date   VD25OH 38.80 01/11/2022   VD25OH 23.88 (L) 05/22/2021   VD25OH 28.3 (L) 04/26/2020   Lab Results  Component Value Date   WBC 10.2 01/11/2022   HGB 12.8 01/11/2022   HCT 38.9 01/11/2022   MCV 81.0 01/11/2022   PLT 362.0 01/11/2022   Lab Results  Component Value Date   IRON 38 (L) 01/11/2022   TIBC 425.6 01/11/2022   FERRITIN 19.7 01/11/2022   Attestation Statements:   Reviewed by clinician on day of visit: allergies, medications, problem list, medical history, surgical history, family history, social history, and previous encounter notes.  Time spent on visit including pre-visit chart review and post-visit care and documentation was 43 minutes. Time was spent on: I performed a medically necessary appropriate  examination and/or evaluation. I discussed the assessment and treatment plan with the patient. Motivational interviewing as well as evidence-based interventions for health behavior change were utilized today including the discussion of self monitoring techniques, problem-solving barriers and SMART goal setting techniques.  The patient was provided an opportunity to ask questions and all were answered. The patient agreed with the plan and demonstrated an understanding of the instructions. Clinical information was updated and documented in the EMR. I, Water quality scientist, CMA, am acting as transcriptionist for Briscoe Deutscher, DO  I have reviewed the above documentation for accuracy and completeness, and I agree with the above. -  Briscoe Deutscher, DO, MS, FAAFP, DABOM - Family and Bariatric Medicine.

## 2022-01-31 NOTE — Telephone Encounter (Signed)
Dr.Wallace °

## 2022-02-11 ENCOUNTER — Encounter: Payer: Self-pay | Admitting: Family Medicine

## 2022-02-11 MED ORDER — ZALEPLON 5 MG PO CAPS: 10.0000 mg | ORAL_CAPSULE | Freq: Every day | ORAL | 0 refills | Status: DC

## 2022-02-11 NOTE — Telephone Encounter (Signed)
Last OV:  01/29/22, GAD/MDD f/u Next OV:  03/05/22, 4 wk f/u

## 2022-02-13 ENCOUNTER — Encounter (INDEPENDENT_AMBULATORY_CARE_PROVIDER_SITE_OTHER): Payer: Self-pay | Admitting: Family Medicine

## 2022-02-13 ENCOUNTER — Ambulatory Visit (INDEPENDENT_AMBULATORY_CARE_PROVIDER_SITE_OTHER): Payer: 59 | Admitting: Family Medicine

## 2022-02-13 ENCOUNTER — Other Ambulatory Visit: Payer: Self-pay

## 2022-02-13 ENCOUNTER — Encounter: Payer: Self-pay | Admitting: Family Medicine

## 2022-02-13 VITALS — BP 121/81 | HR 83 | Temp 97.9°F | Ht 64.0 in | Wt 246.0 lb

## 2022-02-13 DIAGNOSIS — F5081 Binge eating disorder: Secondary | ICD-10-CM | POA: Diagnosis not present

## 2022-02-13 DIAGNOSIS — R7303 Prediabetes: Secondary | ICD-10-CM | POA: Diagnosis not present

## 2022-02-13 DIAGNOSIS — E669 Obesity, unspecified: Secondary | ICD-10-CM

## 2022-02-13 DIAGNOSIS — F411 Generalized anxiety disorder: Secondary | ICD-10-CM | POA: Diagnosis not present

## 2022-02-13 DIAGNOSIS — Z6841 Body Mass Index (BMI) 40.0 and over, adult: Secondary | ICD-10-CM

## 2022-02-18 NOTE — Progress Notes (Signed)
Chief Complaint:   OBESITY Andrea Colon is here to discuss her progress with her obesity treatment plan along with follow-up of her obesity related diagnoses. See Medical Weight Management Flowsheet for complete bioelectrical impedance results.  Today's visit was #: 84 Starting weight: 269 lbs Starting date: 05/31/2021 Weight change since last visit: 4 lbs Total lbs lost to date: 23 lbs Total weight loss percentage to date: -8.55%  Nutrition Plan: Keeping a food journal and adhering to recommended goals of 1750-1900 calories and 100 grams of protein daily for 90-95% of the time. Activity: None.  Interim History: Andrea Colon says that her anxiety is helped by the Lexapro.  She is still scared to increase the dose.  She was able to get Vyvanse for free for a year.  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  She says she has been zeroing out the kitchen every night.    Assessment/Plan:   1. Prediabetes Not at goal. Goal is HgbA1c < 5.7.  Medication: None.    Plan: She will continue to focus on protein-rich, low simple carbohydrate foods. We reviewed the importance of hydration, regular exercise for stress reduction, and restorative sleep.   Lab Results  Component Value Date   HGBA1C 6.1 05/22/2021   Lab Results  Component Value Date   INSULIN 56.1 (H) 05/31/2021   INSULIN 35.1 (H) 04/26/2020   2. Binge eating disorder, with ADHD Improving. Medication:  Andrea Colon is taking Vyvanse 40 mg daily.  Counseling ADHD is the most missed diagnosis in relation to food and appetite problems. Often the strong urge to binge or to self-medicate with food subsides once the impulsivity and inattention of ADHD are treated. A person can experience a new ability to tune in to the body's signals, control cravings and improve impulse control.  A deficiency in norepinephrine and dopamine can lead to the following behaviors related to eating:  Poor awareness of internal cues of hunger and satiety, or  fullness. Inability to follow a meal plan. Inability to judge portion size accurately. Inability to stop bingeing or purging. Distraction by continual thoughts of food, weight and body shape. Increased desire to overeat, especially high calorie, "reward" type foods. Poor self-esteem due to repeated failures of self-control.  Plan: Continue Vyvanse 40 mg daily.  3. Generalized anxiety disorder Andrea Colon is taking Lexapro 5 mg daily for anxiety.  She feels that it is helping her. Discussed cues and consequences, how thoughts affect eating, model of thoughts, feelings, and behaviors, and strategies for change by focusing on the cue. Discussed cognitive distortions, coping thoughts, and how to change your thoughts.  4. Obesity: Current BMI 42.4  Course: Andrea Colon is currently in the action stage of change. As such, her goal is to continue with weight loss efforts.   Nutrition goals: She has agreed to keeping a food journal and adhering to recommended goals of 1750-1900 calories and 100 grams of protein.   Exercise goals:  Increase NEAT.  Behavioral modification strategies: increasing lean protein intake, decreasing simple carbohydrates, increasing vegetables, and increasing water intake.  Andrea Colon has agreed to follow-up with our clinic in 4 weeks. She was informed of the importance of frequent follow-up visits to maximize her success with intensive lifestyle modifications for her multiple health conditions.   Objective:   Blood pressure 121/81, pulse 83, temperature 97.9 F (36.6 C), temperature source Oral, height 5\' 4"  (1.626 m), weight 246 lb (111.6 kg), SpO2 98 %. Body mass index is 42.23 kg/m.  General: Cooperative,  alert, well developed, in no acute distress. HEENT: Conjunctivae and lids unremarkable. Cardiovascular: Regular rhythm.  Lungs: Normal work of breathing. Neurologic: No focal deficits.   Lab Results  Component Value Date   CREATININE 0.77 01/11/2022   BUN 11 01/11/2022    NA 136 01/11/2022   K 3.8 01/11/2022   CL 99 01/11/2022   CO2 27 01/11/2022   Lab Results  Component Value Date   ALT 15 05/22/2021   AST 14 05/22/2021   ALKPHOS 37 (L) 05/22/2021   BILITOT 0.4 05/22/2021   Lab Results  Component Value Date   HGBA1C 6.1 05/22/2021   HGBA1C 6.3 02/08/2020   HGBA1C 6.4 (A) 10/01/2018   HGBA1C 6.1 04/09/2016   HGBA1C 5.5 05/14/2011   Lab Results  Component Value Date   INSULIN 56.1 (H) 05/31/2021   INSULIN 35.1 (H) 04/26/2020   Lab Results  Component Value Date   TSH 3.18 01/11/2022   Lab Results  Component Value Date   CHOL 137 05/22/2021   HDL 31.70 (L) 05/22/2021   LDLCALC 61 10/01/2018   LDLDIRECT 81.0 05/22/2021   TRIG 222.0 (H) 05/22/2021   CHOLHDL 4 05/22/2021   Lab Results  Component Value Date   VD25OH 38.80 01/11/2022   VD25OH 23.88 (L) 05/22/2021   VD25OH 28.3 (L) 04/26/2020   Lab Results  Component Value Date   WBC 10.2 01/11/2022   HGB 12.8 01/11/2022   HCT 38.9 01/11/2022   MCV 81.0 01/11/2022   PLT 362.0 01/11/2022   Lab Results  Component Value Date   IRON 38 (L) 01/11/2022   TIBC 425.6 01/11/2022   FERRITIN 19.7 01/11/2022   Attestation Statements:   Reviewed by clinician on day of visit: allergies, medications, problem list, medical history, surgical history, family history, social history, and previous encounter notes.  I, Water quality scientist, CMA, am acting as transcriptionist for Briscoe Deutscher, DO  I have reviewed the above documentation for accuracy and completeness, and I agree with the above. -  Briscoe Deutscher, DO, MS, FAAFP, DABOM - Family and Bariatric Medicine.

## 2022-03-05 ENCOUNTER — Ambulatory Visit: Payer: 59 | Admitting: Family Medicine

## 2022-03-07 ENCOUNTER — Encounter (INDEPENDENT_AMBULATORY_CARE_PROVIDER_SITE_OTHER): Payer: Self-pay | Admitting: Family Medicine

## 2022-03-07 ENCOUNTER — Ambulatory Visit (INDEPENDENT_AMBULATORY_CARE_PROVIDER_SITE_OTHER): Payer: 59 | Admitting: Family Medicine

## 2022-03-07 ENCOUNTER — Other Ambulatory Visit: Payer: Self-pay

## 2022-03-07 VITALS — BP 113/71 | HR 109 | Temp 98.3°F | Ht 64.0 in | Wt 241.0 lb

## 2022-03-07 DIAGNOSIS — F5104 Psychophysiologic insomnia: Secondary | ICD-10-CM | POA: Diagnosis not present

## 2022-03-07 DIAGNOSIS — F411 Generalized anxiety disorder: Secondary | ICD-10-CM | POA: Diagnosis not present

## 2022-03-07 DIAGNOSIS — F5081 Binge eating disorder: Secondary | ICD-10-CM | POA: Diagnosis not present

## 2022-03-07 DIAGNOSIS — J310 Chronic rhinitis: Secondary | ICD-10-CM | POA: Diagnosis not present

## 2022-03-07 DIAGNOSIS — E669 Obesity, unspecified: Secondary | ICD-10-CM

## 2022-03-07 DIAGNOSIS — Z6841 Body Mass Index (BMI) 40.0 and over, adult: Secondary | ICD-10-CM

## 2022-03-07 MED ORDER — AZELASTINE HCL 0.1 % NA SOLN: 1.0000 | Freq: Two times a day (BID) | NASAL | 0 refills | Status: DC

## 2022-03-11 ENCOUNTER — Encounter (INDEPENDENT_AMBULATORY_CARE_PROVIDER_SITE_OTHER): Payer: Self-pay | Admitting: Family Medicine

## 2022-03-11 NOTE — Telephone Encounter (Signed)
Dr.Wallace °

## 2022-03-12 ENCOUNTER — Ambulatory Visit: Payer: 59 | Admitting: Family Medicine

## 2022-03-12 ENCOUNTER — Ambulatory Visit (INDEPENDENT_AMBULATORY_CARE_PROVIDER_SITE_OTHER): Payer: 59 | Admitting: Family Medicine

## 2022-03-12 ENCOUNTER — Other Ambulatory Visit: Payer: Self-pay

## 2022-03-12 VITALS — BP 120/84 | HR 80 | Temp 98.6°F | Resp 14 | Ht 64.0 in | Wt 243.0 lb

## 2022-03-12 DIAGNOSIS — Z862 Personal history of diseases of the blood and blood-forming organs and certain disorders involving the immune mechanism: Secondary | ICD-10-CM

## 2022-03-12 DIAGNOSIS — I10 Essential (primary) hypertension: Secondary | ICD-10-CM

## 2022-03-12 DIAGNOSIS — R7303 Prediabetes: Secondary | ICD-10-CM | POA: Diagnosis not present

## 2022-03-12 DIAGNOSIS — F330 Major depressive disorder, recurrent, mild: Secondary | ICD-10-CM | POA: Diagnosis not present

## 2022-03-12 DIAGNOSIS — E781 Pure hyperglyceridemia: Secondary | ICD-10-CM

## 2022-03-12 DIAGNOSIS — E559 Vitamin D deficiency, unspecified: Secondary | ICD-10-CM | POA: Diagnosis not present

## 2022-03-12 DIAGNOSIS — F411 Generalized anxiety disorder: Secondary | ICD-10-CM

## 2022-03-12 DIAGNOSIS — F5104 Psychophysiologic insomnia: Secondary | ICD-10-CM

## 2022-03-12 DIAGNOSIS — Z6841 Body Mass Index (BMI) 40.0 and over, adult: Secondary | ICD-10-CM

## 2022-03-12 NOTE — Progress Notes (Signed)
? ? Patient ID: Andrea Colon, female    DOB: [birth year], 46 y.o.   MRN: [id number] ? ?This visit was conducted in person. ? ?BP 120/84 (BP Location: Left Arm, Patient Position: Sitting, Cuff Size: Large)   Pulse 80   Temp 98.6 ?F (37 ?C) (Oral)   Resp 14   Ht '5\' 4"'$  (1.626 m)   Wt 243 lb (110.2 kg)   SpO2 98%   BMI 41.71 kg/m?   ? ?CC:  ?Chief Complaint  ?Patient presents with  ? Follow-up  ?  GAD/MDD, has been getting better. Denies any pain.   ? ? ?Subjective:  ? ?HPI: ?Andrea Colon is a 46 y.o. female presenting on 03/12/2022 for Follow-up (GAD/MDD, has been getting better. Denies any pain. ) ? ? ?About 2 month follow up on GAD, MDD and chronic insomnia: ? She is using sonata '5mg'$  at bedtime... has been able to not take 2.. still sleeping well ? She has stopped propranolol as she felt it was causing SE. ?Using klonopin 0.5 mg mg at bedtime ? ? ? PHQ9 today  decreased from 16  to 9. 8 ?GAD7: 8 ? ?  Reviewed last 3 notes from Dr. Juleen China her weight loss MD. Loma Sousa was increase to 10 mg in last 4 weeks. ? ? On vyvanse 40 mg daily for ADD ?Wt Readings from Last 3 Encounters:  ?03/12/22 243 lb (110.2 kg)  ?03/07/22 241 lb (109.3 kg)  ?02/13/22 246 lb (111.6 kg)  ? ?She has stopped supplements except for mg and fish oil. ? Restart spironolactone  once daily for acne. ? ?BP Readings from Last 3 Encounters:  ?03/12/22 120/84  ?03/07/22 113/71  ?02/13/22 121/81  ? ? ?   ? She will have first Appt with counselor in early April. ? ?Relevant past medical, surgical, family and social history reviewed and updated as indicated. Interim medical history since our last visit reviewed. ?Allergies and medications reviewed and updated. ?Outpatient Medications Prior to Visit  ?Medication Sig Dispense Refill  ? azelastine (ASTELIN) 0.1 % nasal spray Place 1 spray into both nostrils 2 (two) times daily. Use in each nostril as directed 30 mL 0  ? clonazePAM (KLONOPIN) 0.5 MG tablet Take 1 tablet (0.5 mg total) by mouth at  bedtime. 30 tablet 0  ? escitalopram (LEXAPRO) 5 MG tablet Take 1 tablet (5 mg total) by mouth daily. (Patient taking differently: Take 10 mg by mouth daily.) 30 tablet 0  ? lisdexamfetamine (VYVANSE) 40 MG capsule Take 1 capsule (40 mg total) by mouth daily before breakfast. 30 capsule 0  ? lisdexamfetamine (VYVANSE) 40 MG capsule Take 1 capsule (40 mg total) by mouth daily before breakfast. 30 capsule 0  ? [START ON 03/31/2022] lisdexamfetamine (VYVANSE) 40 MG capsule Take 1 capsule (40 mg total) by mouth daily before breakfast. 30 capsule 0  ? PARAGARD INTRAUTERINE COPPER IU 1 Device by Intrauterine route once.     ? Vitamin D, Ergocalciferol, (DRISDOL) 1.25 MG (50000 UNIT) CAPS capsule Take 1 capsule (50,000 Units total) by mouth every 7 (seven) days. 12 capsule 0  ? zaleplon (SONATA) 5 MG capsule Take 2 capsules (10 mg total) by mouth at bedtime. 30 capsule 0  ? ?No facility-administered medications prior to visit.  ?  ? ?Per HPI unless specifically indicated in ROS section below ?Review of Systems  ?Constitutional:  Negative for fatigue and fever.  ?HENT:  Negative for ear pain.   ?Eyes:  Negative for pain.  ?Respiratory:  Negative  for chest tightness and shortness of breath.   ?Cardiovascular:  Negative for chest pain, palpitations and leg swelling.  ?Gastrointestinal:  Negative for abdominal pain.  ?Genitourinary:  Negative for dysuria.  ?Objective:  ?BP 120/84 (BP Location: Left Arm, Patient Position: Sitting, Cuff Size: Large)   Pulse 80   Temp 98.6 ?F (37 ?C) (Oral)   Resp 14   Ht '5\' 4"'$  (1.626 m)   Wt 243 lb (110.2 kg)   SpO2 98%   BMI 41.71 kg/m?   ?Wt Readings from Last 3 Encounters:  ?03/12/22 243 lb (110.2 kg)  ?03/07/22 241 lb (109.3 kg)  ?02/13/22 246 lb (111.6 kg)  ?  ?  ?Physical Exam ?Constitutional:   ?   General: She is not in acute distress. ?   Appearance: Normal appearance. She is well-developed. She is obese. She is not ill-appearing or toxic-appearing.  ?HENT:  ?   Head:  Normocephalic.  ?   Right Ear: Hearing, tympanic membrane, ear canal and external ear normal. Tympanic membrane is not erythematous, retracted or bulging.  ?   Left Ear: Hearing, tympanic membrane, ear canal and external ear normal. Tympanic membrane is not erythematous, retracted or bulging.  ?   Nose: No mucosal edema or rhinorrhea.  ?   Right Sinus: No maxillary sinus tenderness or frontal sinus tenderness.  ?   Left Sinus: No maxillary sinus tenderness or frontal sinus tenderness.  ?   Mouth/Throat:  ?   Pharynx: Uvula midline.  ?Eyes:  ?   General: Lids are normal. Lids are everted, no foreign bodies appreciated.  ?   Conjunctiva/sclera: Conjunctivae normal.  ?   Pupils: Pupils are equal, round, and reactive to light.  ?Neck:  ?   Thyroid: No thyroid mass or thyromegaly.  ?   Vascular: No carotid bruit.  ?   Trachea: Trachea normal.  ?Cardiovascular:  ?   Rate and Rhythm: Normal rate and regular rhythm.  ?   Pulses: Normal pulses.  ?   Heart sounds: Normal heart sounds, S1 normal and S2 normal. No murmur heard. ?  No friction rub. No gallop.  ?Pulmonary:  ?   Effort: Pulmonary effort is normal. No tachypnea or respiratory distress.  ?   Breath sounds: Normal breath sounds. No decreased breath sounds, wheezing, rhonchi or rales.  ?Abdominal:  ?   General: Bowel sounds are normal.  ?   Palpations: Abdomen is soft.  ?   Tenderness: There is no abdominal tenderness.  ?Musculoskeletal:  ?   Cervical back: Normal range of motion and neck supple.  ?Skin: ?   General: Skin is warm and dry.  ?   Findings: No rash.  ?Neurological:  ?   Mental Status: She is alert.  ?Psychiatric:     ?   Mood and Affect: Mood is not anxious or depressed.     ?   Speech: Speech normal.     ?   Behavior: Behavior normal. Behavior is cooperative.     ?   Thought Content: Thought content normal.     ?   Judgment: Judgment normal.  ? ?   ?Results for orders placed or performed in visit on 01/11/22  ?VITAMIN D 25 Hydroxy (Vit-D Deficiency,  Fractures)  ?Result Value Ref Range  ? VITD 38.80 30.00 - 100.00 ng/mL  ?TSH  ?Result Value Ref Range  ? TSH 3.18 0.35 - 5.50 uIU/mL  ?Vitamin B12  ?Result Value Ref Range  ? Vitamin B-12 419 211 -  911 pg/mL  ?Basic Metabolic Panel  ?Result Value Ref Range  ? Sodium 136 135 - 145 mEq/L  ? Potassium 3.8 3.5 - 5.1 mEq/L  ? Chloride 99 96 - 112 mEq/L  ? CO2 27 19 - 32 mEq/L  ? Glucose, Bld 108 (H) 70 - 99 mg/dL  ? BUN 11 6 - 23 mg/dL  ? Creatinine, Ser 0.77 0.40 - 1.20 mg/dL  ? GFR 92.60 >60.00 mL/min  ? Calcium 9.1 8.4 - 10.5 mg/dL  ?IBC + Ferritin  ?Result Value Ref Range  ? Iron 38 (L) 42 - 145 ug/dL  ? Transferrin 304.0 212.0 - 360.0 mg/dL  ? Saturation Ratios 8.9 (L) 20.0 - 50.0 %  ? Ferritin 19.7 10.0 - 291.0 ng/mL  ? TIBC 425.6 250.0 - 450.0 mcg/dL  ?CBC with Differential/Platelet  ?Result Value Ref Range  ? WBC 10.2 4.0 - 10.5 K/uL  ? RBC 4.81 3.87 - 5.11 Mil/uL  ? Hemoglobin 12.8 12.0 - 15.0 g/dL  ? HCT 38.9 36.0 - 46.0 %  ? MCV 81.0 78.0 - 100.0 fl  ? MCHC 33.0 30.0 - 36.0 g/dL  ? RDW 15.4 11.5 - 15.5 %  ? Platelets 362.0 150.0 - 400.0 K/uL  ? Neutrophils Relative % 74.2 43.0 - 77.0 %  ? Lymphocytes Relative 18.2 12.0 - 46.0 %  ? Monocytes Relative 5.2 3.0 - 12.0 %  ? Eosinophils Relative 2.0 0.0 - 5.0 %  ? Basophils Relative 0.4 0.0 - 3.0 %  ? Neutro Abs 7.5 1.4 - 7.7 K/uL  ? Lymphs Abs 1.9 0.7 - 4.0 K/uL  ? Monocytes Absolute 0.5 0.1 - 1.0 K/uL  ? Eosinophils Absolute 0.2 0.0 - 0.7 K/uL  ? Basophils Absolute 0.0 0.0 - 0.1 K/uL  ? ? ?This visit occurred during the SARS-CoV-2 public health emergency.  Safety protocols were in place, including screening questions prior to the visit, additional usage of staff PPE, and extensive cleaning of exam room while observing appropriate contact time as indicated for disinfecting solutions.  ? ?COVID 19 screen:  No recent travel or known exposure to [place] ?The patient denies respiratory symptoms of COVID 19 at this time. ?The importance of social distancing was  discussed today.  ? ?Assessment and Plan ?Problem List Items Addressed This Visit   ? ? Depression, major, recurrent, mild (HCC) (Chronic)  ?  Chronic, improving ? ?She has noticed a significant improvement since increasing her medication

## 2022-03-12 NOTE — Assessment & Plan Note (Signed)
Chronic, improving ? ?Followed at medical weight loss clinic by Dr. Juleen China ?

## 2022-03-12 NOTE — Assessment & Plan Note (Signed)
Chronic, improved ? ?She has been able to wean down to 1 tablet of Sonata at bedtime most nights.  She does still use Klonopin 0.5 mg as needed anxiety ?

## 2022-03-12 NOTE — Assessment & Plan Note (Signed)
Chronic, improving ? ?She has noticed a significant improvement since increasing her medication to 10 mg.  She will be starting counseling in early April ? ?Continue Lexapro 10 mg daily ? ?Follow-up in 3 months ?

## 2022-03-12 NOTE — Assessment & Plan Note (Addendum)
Chronic, well controlled ? ?Stable despite stimulant medication.  On no blood pressure medication except on spironolactone for acne. ?

## 2022-03-14 NOTE — Telephone Encounter (Signed)
Please review
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
## 2022-03-21 ENCOUNTER — Encounter (INDEPENDENT_AMBULATORY_CARE_PROVIDER_SITE_OTHER): Payer: Self-pay | Admitting: Family Medicine

## 2022-03-21 ENCOUNTER — Encounter: Payer: Self-pay | Admitting: Family Medicine

## 2022-03-21 ENCOUNTER — Ambulatory Visit (INDEPENDENT_AMBULATORY_CARE_PROVIDER_SITE_OTHER): Payer: 59 | Admitting: Family Medicine

## 2022-03-21 VITALS — BP 112/76 | HR 85 | Temp 98.4°F | Ht 64.0 in | Wt 234.0 lb

## 2022-03-21 DIAGNOSIS — F411 Generalized anxiety disorder: Secondary | ICD-10-CM

## 2022-03-21 DIAGNOSIS — E669 Obesity, unspecified: Secondary | ICD-10-CM | POA: Diagnosis not present

## 2022-03-21 DIAGNOSIS — F5081 Binge eating disorder: Secondary | ICD-10-CM | POA: Diagnosis not present

## 2022-03-21 DIAGNOSIS — R7303 Prediabetes: Secondary | ICD-10-CM | POA: Diagnosis not present

## 2022-03-21 DIAGNOSIS — Z6841 Body Mass Index (BMI) 40.0 and over, adult: Secondary | ICD-10-CM

## 2022-03-24 ENCOUNTER — Encounter: Payer: Self-pay | Admitting: Family Medicine

## 2022-03-24 ENCOUNTER — Other Ambulatory Visit: Payer: Self-pay | Admitting: Family Medicine

## 2022-03-24 NOTE — Telephone Encounter (Signed)
Last office visit 03/12/22 for MDD/GAD.  Last refilled 07/21/18 for #30 with no refills.  CPE 06/18/22. ?

## 2022-03-24 NOTE — Telephone Encounter (Signed)
Last office visit 03/12/22 for MDD/GAD.  Last refilled 01/2022 for #30 with no refills.  CPE 06/18/22. ?

## 2022-03-25 MED ORDER — CLONAZEPAM 0.5 MG PO TABS: 0.5000 mg | ORAL_TABLET | Freq: Every day | ORAL | 0 refills | Status: DC

## 2022-03-26 ENCOUNTER — Ambulatory Visit (INDEPENDENT_AMBULATORY_CARE_PROVIDER_SITE_OTHER): Payer: 59 | Admitting: Clinical

## 2022-03-26 ENCOUNTER — Encounter (INDEPENDENT_AMBULATORY_CARE_PROVIDER_SITE_OTHER): Payer: Self-pay | Admitting: Family Medicine

## 2022-03-26 DIAGNOSIS — F331 Major depressive disorder, recurrent, moderate: Secondary | ICD-10-CM | POA: Diagnosis not present

## 2022-03-26 DIAGNOSIS — F411 Generalized anxiety disorder: Secondary | ICD-10-CM

## 2022-03-26 DIAGNOSIS — F902 Attention-deficit hyperactivity disorder, combined type: Secondary | ICD-10-CM | POA: Diagnosis not present
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
## 2022-04-02 ENCOUNTER — Encounter (INDEPENDENT_AMBULATORY_CARE_PROVIDER_SITE_OTHER): Payer: Self-pay | Admitting: Family Medicine

## 2022-04-03 NOTE — Progress Notes (Signed)
Chief Complaint:   OBESITY Andrea Colon is here to discuss her progress with her obesity treatment plan along with follow-up of her obesity related diagnoses. See Medical Weight Management Flowsheet for complete bioelectrical impedance results.  Today's visit was #: 15 Starting weight: 269 lbs Starting date: 05/31/2021 Weight change since last visit: 7 lbs Total lbs lost to date: 35 lbs Total weight loss percentage to date: -13.01%  Nutrition Plan: Keeping a food journal and adhering to recommended goals of 1750-1900 calories and 100 grams of protein daily for 50% of the time.  Activity: None.  Interim History: Andrea Colon reports that she is still logging her food daily.  She says that she is still struggling with getting in all her protein, but she is drinking her shake everyday.  She endorses skipping meals at times and says that she likes "that hunger feeling".  She says she has a whole different attitude where food is concerned.    She is still waiting for the referral to [facility] for her son.  She says that his medications are now on file at school, which makes things much easier.  He went to a birthday party this past weekend, and had a great time.  Assessment/Plan:   1. Prediabetes Improving, but not optimized. Goal is HgbA1c < 5.7.  Medication: None.    Plan: She will continue to focus on protein-rich, low simple carbohydrate foods. We reviewed the importance of hydration, regular exercise for stress reduction, and restorative sleep.   Lab Results  Component Value Date   HGBA1C 6.1 05/22/2021   Lab Results  Component Value Date   INSULIN 56.1 (H) 05/31/2021   INSULIN 35.1 (H) 04/26/2020   2. Binge eating disorder, with ADHD Medication: Vyvanse 40 mg daily.  Counseling ADHD is the most missed diagnosis in relation to food and appetite problems. Often the strong urge to binge or to self-medicate with food subsides once the impulsivity and inattention of ADHD are treated. A  person can experience a new ability to tune in to the body's signals, control cravings and improve impulse control.  A deficiency in norepinephrine and dopamine can lead to the following behaviors related to eating:  Poor awareness of internal cues of hunger and satiety, or fullness. Inability to follow a meal plan. Inability to judge portion size accurately. Inability to stop bingeing or purging. Distraction by continual thoughts of food, weight and body shape. Increased desire to overeat, especially high calorie, "reward" type foods. Poor self-esteem due to repeated failures of self-control.  Plan: Continue Vyvanse 40 mg daily.  3. Generalized anxiety disorder Not at goal. Medication: Lexapro 10 mg daily.    Plan: Motivational interviewing as well as evidence-based interventions for health behavior change were utilized today including the discussion of self monitoring techniques, problem-solving barriers and SMART goal setting techniques.  Discussed cues and consequences, how thoughts affect eating, model of thoughts, feelings, and behaviors, and strategies for change by focusing on the cue. Discussed cognitive distortions, coping thoughts, and how to change your thoughts.  4. Obesity with current BMI of 40.2  Course: Andrea Colon is currently in the action stage of change. As such, her goal is to continue with weight loss efforts.   Nutrition goals: She has agreed to keeping a food journal and adhering to recommended goals of 1750-1900 calories and 100 grams of protein.   Exercise goals:  As is.  Behavioral modification strategies: increasing lean protein intake, decreasing simple carbohydrates, increasing vegetables, and increasing water intake.  Andrea Colon has agreed to follow-up with our clinic in 6 weeks. She was informed of the importance of frequent follow-up visits to maximize her success with intensive lifestyle modifications for her multiple health conditions.   Objective:   Blood  pressure 112/76, pulse 85, temperature 98.4 F (36.9 C), temperature source Oral, height '5\' 4"'$  (1.626 m), weight 234 lb (106.1 kg), SpO2 97 %. Body mass index is 40.17 kg/m.  General: Cooperative, alert, well developed, in no acute distress. HEENT: Conjunctivae and lids unremarkable. Cardiovascular: Regular rhythm.  Lungs: Normal work of breathing. Neurologic: No focal deficits.   Lab Results  Component Value Date   CREATININE 0.77 01/11/2022   BUN 11 01/11/2022   NA 136 01/11/2022   K 3.8 01/11/2022   CL 99 01/11/2022   CO2 27 01/11/2022   Lab Results  Component Value Date   ALT 15 05/22/2021   AST 14 05/22/2021   ALKPHOS 37 (L) 05/22/2021   BILITOT 0.4 05/22/2021   Lab Results  Component Value Date   HGBA1C 6.1 05/22/2021   HGBA1C 6.3 02/08/2020   HGBA1C 6.4 (A) 10/01/2018   HGBA1C 6.1 04/09/2016   HGBA1C 5.5 05/14/2011   Lab Results  Component Value Date   INSULIN 56.1 (H) 05/31/2021   INSULIN 35.1 (H) 04/26/2020   Lab Results  Component Value Date   TSH 3.18 01/11/2022   Lab Results  Component Value Date   CHOL 137 05/22/2021   HDL 31.70 (L) 05/22/2021   LDLCALC 61 10/01/2018   LDLDIRECT 81.0 05/22/2021   TRIG 222.0 (H) 05/22/2021   CHOLHDL 4 05/22/2021   Lab Results  Component Value Date   VD25OH 38.80 01/11/2022   VD25OH 23.88 (L) 05/22/2021   VD25OH 28.3 (L) 04/26/2020   Lab Results  Component Value Date   WBC 10.2 01/11/2022   HGB 12.8 01/11/2022   HCT 38.9 01/11/2022   MCV 81.0 01/11/2022   PLT 362.0 01/11/2022   Lab Results  Component Value Date   IRON 38 (L) 01/11/2022   TIBC 425.6 01/11/2022   FERRITIN 19.7 01/11/2022   Attestation Statements:   Reviewed by clinician on day of visit: allergies, medications, problem list, medical history, surgical history, family history, social history, and previous encounter notes.  I, Water quality scientist, CMA, am acting as transcriptionist for Briscoe Deutscher, DO  I have reviewed the above  documentation for accuracy and completeness, and I agree with the above. -  Briscoe Deutscher, DO, MS, FAAFP, DABOM - Family and Bariatric Medicine.

## 2022-04-04 ENCOUNTER — Encounter (INDEPENDENT_AMBULATORY_CARE_PROVIDER_SITE_OTHER): Payer: Self-pay | Admitting: Family Medicine

## 2022-04-04 ENCOUNTER — Telehealth (INDEPENDENT_AMBULATORY_CARE_PROVIDER_SITE_OTHER): Payer: 59 | Admitting: Family Medicine

## 2022-04-04 DIAGNOSIS — Z6841 Body Mass Index (BMI) 40.0 and over, adult: Secondary | ICD-10-CM

## 2022-04-04 DIAGNOSIS — F411 Generalized anxiety disorder: Secondary | ICD-10-CM | POA: Diagnosis not present

## 2022-04-04 DIAGNOSIS — F5081 Binge eating disorder: Secondary | ICD-10-CM

## 2022-04-04 DIAGNOSIS — E669 Obesity, unspecified: Secondary | ICD-10-CM | POA: Diagnosis not present

## 2022-04-04 DIAGNOSIS — E559 Vitamin D deficiency, unspecified: Secondary | ICD-10-CM | POA: Diagnosis not present

## 2022-04-04 MED ORDER — ESCITALOPRAM OXALATE 10 MG PO TABS: 10.0000 mg | ORAL_TABLET | Freq: Every day | ORAL | 0 refills | Status: DC

## 2022-04-04 MED ORDER — LISDEXAMFETAMINE DIMESYLATE 40 MG PO CAPS: 40.0000 mg | ORAL_CAPSULE | Freq: Every day | ORAL | 0 refills | Status: DC

## 2022-04-04 MED ORDER — VITAMIN D (ERGOCALCIFEROL) 1.25 MG (50000 UNIT) PO CAPS: 50000.0000 [IU] | ORAL_CAPSULE | ORAL | 0 refills | Status: DC

## 2022-04-04 NOTE — Progress Notes (Signed)
TeleHealth Visit:  Due to the COVID-19 pandemic, this visit was completed with telemedicine (audio/video) technology to reduce patient and provider exposure as well as to preserve personal protective equipment.   [name] has verbally consented to this TeleHealth visit. The patient is located at home, the provider is located at home. The participants in this visit include the listed provider and patient. The visit was conducted today via MyChart video.  OBESITY Andrea Colon is here to discuss her progress with her obesity treatment plan along with follow-up of her obesity related diagnoses.   Today's visit was #: 16 Starting weight: 269 lbs Starting date: 05/31/2021 Today's date: 04/04/2022 Does not weigh at home.  Nutrition Plan: Keeping a food journal and adhering to recommended goals of 1750-1900 calories and 100 grams of protein daily for 50% of the time.  Activity: None at this time.  Assessment/Plan:   Diagnoses and all orders for this visit:  Vitamin D deficiency At goal.   Plan: Continue to take prescription Vitamin D '@50'$ ,000 IU every week as prescribed.  Follow-up for routine testing of Vitamin D, at least 2-3 times per year to avoid over-replacement.  Lab Results  Component Value Date   VD25OH 38.80 01/11/2022   VD25OH 23.88 (L) 05/22/2021   VD25OH 28.3 (L) 04/26/2020   -     Vitamin D, Ergocalciferol, (DRISDOL) 1.25 MG (50000 UNIT) CAPS capsule; Take 1 capsule (50,000 Units total) by mouth every 7 (seven) days.  Binge eating disorder, with ADHD The goals for treatment of BED are to reduce eating binges and to achieve healthy eating habits. Because binge eating can correlate with negative emotions, treatment may also address any other mental-health issues, such as depression.  People who binge eat feel as if they don't have control over how much they eat and have feelings of guilt or self-loathing after a binge eating episode.  The FDA has approved Vyvanse as a treatment  option for binge eating disorder. Vyvanse targets the brain's reward center by increasing the levels of dopamine and norepinephrine, the chemicals of the brain responsible for feelings of pleasure.  Mindful eating is the recommended nutritional approach to treating BED.   -     lisdexamfetamine (VYVANSE) 40 MG capsule; Take 1 capsule (40 mg total) by mouth daily before breakfast.  Generalized anxiety disorder Improving, but not optimized. Provided psychoeducation and facilitated discussion regarding eating as habit, self-monitoring, stimulus control, regulating eating pattern, coping skills, and barriers to success.   -     escitalopram (LEXAPRO) 10 MG tablet; Take 1 tablet (10 mg total) by mouth daily.  Obesity with current BMI of 40.2 [name] is currently in the action stage of change. As such, her goal is to continue with weight loss efforts. She has agreed to Keeping a food journal and adhering to recommended goals of 1750-1900 calories and 100 grams of protein daily.   Exercise goals: As is.  Behavioral modification strategies: ways to avoid boredom eating.  [name] has agreed to follow-up with our clinic in 4 weeks. She was informed of the importance of frequent follow-up visits to maximize her success with intensive lifestyle modifications for her multiple health conditions.  Objective:   VITALS: Per patient if applicable, see vitals. GENERAL: Alert and in no acute distress. CARDIOPULMONARY: No increased WOB. Speaking in clear sentences.  PSYCH: Pleasant and cooperative. Speech normal rate and rhythm. Affect is appropriate. Insight and judgement are appropriate. Attention is focused, linear, and appropriate.  NEURO: Oriented as arrived to  appointment on time with no prompting.   Lab Results  Component Value Date   CREATININE 0.77 01/11/2022   BUN 11 01/11/2022   NA 136 01/11/2022   K 3.8 01/11/2022   CL 99 01/11/2022   CO2 27 01/11/2022   Lab Results  Component Value Date    ALT 15 05/22/2021   AST 14 05/22/2021   ALKPHOS 37 (L) 05/22/2021   BILITOT 0.4 05/22/2021   Lab Results  Component Value Date   HGBA1C 6.1 05/22/2021   HGBA1C 6.3 02/08/2020   HGBA1C 6.4 (A) 10/01/2018   HGBA1C 6.1 04/09/2016   HGBA1C 5.5 05/14/2011   Lab Results  Component Value Date   INSULIN 56.1 (H) 05/31/2021   INSULIN 35.1 (H) 04/26/2020   Lab Results  Component Value Date   TSH 3.18 01/11/2022   Lab Results  Component Value Date   CHOL 137 05/22/2021   HDL 31.70 (L) 05/22/2021   LDLCALC 61 10/01/2018   LDLDIRECT 81.0 05/22/2021   TRIG 222.0 (H) 05/22/2021   CHOLHDL 4 05/22/2021   Lab Results  Component Value Date   WBC 10.2 01/11/2022   HGB 12.8 01/11/2022   HCT 38.9 01/11/2022   MCV 81.0 01/11/2022   PLT 362.0 01/11/2022   Lab Results  Component Value Date   IRON 38 (L) 01/11/2022   TIBC 425.6 01/11/2022   FERRITIN 19.7 01/11/2022   Attestation Statements:   Reviewed by clinician on day of visit: allergies, medications, problem list, medical history, surgical history, family history, social history, and previous encounter notes.

## 2022-04-17 ENCOUNTER — Ambulatory Visit (INDEPENDENT_AMBULATORY_CARE_PROVIDER_SITE_OTHER): Payer: 59 | Admitting: Clinical

## 2022-04-17 DIAGNOSIS — F900 Attention-deficit hyperactivity disorder, predominantly inattentive type: Secondary | ICD-10-CM

## 2022-04-17 DIAGNOSIS — F331 Major depressive disorder, recurrent, moderate: Secondary | ICD-10-CM | POA: Diagnosis not present

## 2022-04-17 DIAGNOSIS — F411 Generalized anxiety disorder: Secondary | ICD-10-CM

## 2022-04-17 NOTE — Progress Notes (Signed)
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 ?Andrea Colon was seen in person for individual therapy. She shared documents related to an upcoming neuropsychological evaluation for her son, and therapist shared questions she can ask her provider in the event that he is being evaluated for ASD. Andrea Colon shared a desire to increase her emotion regulation skills, especially as pertains to parenting her son. Therapist provided psychoeducation on the benefits of exercise for mental health, and Andrea Colon shared that she has a recumbent bike that she has never assembled. For homework, she will assemble her recumbent bike. She was also interested in the idea of discussing a token economy system for her son in the next session. She is scheduled to be seen again in one week. ? ? ? 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 She expresses herself well in writing.  ?Client Treatment Preferences  ?Andrea Colon prefers in-person sessions, during school hours  ?Client Statement of Needs  ?Andrea Colon shared that she is seeking parenting skills, as well as healthier coping skills.  ?Treatment Level  ?Weekly  ?Symptoms  ?Anxiety : restlessness, difficulty relaxing, racing thoughts, difficulty concentrating, difficulty with creating and adhering to plans and routines (Status: maintained). Depression: tearfulness (Status: maintained).  ?Problems Addressed  ?New Description, New Description  ?Goals ?1. Andrea Colon experiences difficulty with executive functioning ?Objective ?Andrea Colon will develop and implement organizational strategies ?Target Date: 2023-03-27 Frequency: Weekly  ?Progress: 0 Modality: individual  ?Related Interventions ?Therapist will help Andrea Colon to consider organizational strategies in her day-to-day life, incorporating manualized treatments such as [name] ?Therapist will provide opportunities to  process parenting experiences in session and incorporate strategies from manualized parent training programs ?2. Andrea Colon frequently experiences feelings of overwhelm that are difficult to cope with ?Objective ?Andrea Colon would like to develop coping strategies and reduce the frequency of meltdowns  ?Target Date: 2023-03-27 Frequency: Weekly  ?Progress: 0 Modality: individual  ?Related Interventions ?Andrea Colon will be provided opportunities to process experiences in sessions ?Therapist will provide referrals for additional resources as appropriate  ?Therapist will help Andrea Colon to identify and disengage from maladaptive thoughts and behaviors using CBT-based strategies ?Therapist will provide emotion regulation strategies including mindfulness, relaxation, and self care techniques ?Diagnosis ?Axis none 300.02 (Generalized anxiety disorder) - Open - [Signifier: n/a]    ?Axis none 314.01 (Attention deficit disorder with hyperactivity) - Open - [Signifier: n/a]    ?Conditions For Discharge ?Achievement of treatment goals and objectives ? ? ? ? ? ? ? ? ? ? ? ? ? ? ? ?[name], PhD ? ? ? ? ? ? ? ? ? ? ? ? ? ? ?[name], PhD ?

## 2022-04-19 ENCOUNTER — Ambulatory Visit: Payer: 59 | Admitting: Clinical

## 2022-04-21 ENCOUNTER — Other Ambulatory Visit: Payer: Self-pay | Admitting: Family Medicine

## 2022-04-22 ENCOUNTER — Encounter: Payer: Self-pay | Admitting: Family Medicine

## 2022-04-22 MED ORDER — CLONAZEPAM 0.5 MG PO TABS: 0.5000 mg | ORAL_TABLET | Freq: Every day | ORAL | 0 refills | Status: DC

## 2022-04-22 MED ORDER — ZALEPLON 5 MG PO CAPS: 5.0000 mg | ORAL_CAPSULE | Freq: Every day | ORAL | 0 refills | Status: DC

## 2022-04-22 NOTE — Telephone Encounter (Signed)
Last office visit 03/12/2022 for follow up MDD/GAD.  Last refilled both meds on 03/25/2022 For #30 with no refills.  CPE scheduled for 05/29/2022. ?

## 2022-04-25 ENCOUNTER — Encounter: Payer: Self-pay | Admitting: Family Medicine

## 2022-04-26 ENCOUNTER — Ambulatory Visit (INDEPENDENT_AMBULATORY_CARE_PROVIDER_SITE_OTHER): Payer: 59 | Admitting: Clinical

## 2022-04-26 DIAGNOSIS — F902 Attention-deficit hyperactivity disorder, combined type: Secondary | ICD-10-CM | POA: Diagnosis not present

## 2022-04-26 DIAGNOSIS — F331 Major depressive disorder, recurrent, moderate: Secondary | ICD-10-CM | POA: Diagnosis not present

## 2022-04-26 DIAGNOSIS — F411 Generalized anxiety disorder: Secondary | ICD-10-CM | POA: Diagnosis not present

## 2022-04-26 NOTE — Progress Notes (Signed)
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 ?Andrea Colon was seen in person for individual therapy. She had emailed the therapist to share that she has been occasionally engaging in self harm consisting of cutting her thighs with razors over the past year. Suicidal ideation and intent were denied, and she listed her son as a major barrier. Therapist provided psychoeducation on DBT, and provided the contact information for [facility]. Alleen was open to the idea of participating in a DBT group. Therapist reviewed REST skills with her, and engaged her in creation of an emergency coping plan. She agreed to try snapping a rubber band on her wrist or holding ice the next time she has the urge to cut. She is scheduled to be seen again in one week. ? 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 She expresses herself well in writing.  ?Client Treatment Preferences  ?[name] prefers in-person sessions, during school hours  ?Client Statement of Needs  ?Cambry shared that she is seeking parenting skills, as well as healthier coping skills.  ?Treatment Level  ?Weekly  ?Symptoms  ?Anxiety : restlessness, difficulty relaxing, racing thoughts, difficulty concentrating, difficulty with creating and adhering to plans and routines (Status: maintained). Depression: tearfulness (Status: maintained).  ?Problems Addressed  ?New Description, New Description  ?Goals ?1. Chandi experiences difficulty with executive functioning ?Objective ?Aubryn will develop and implement organizational strategies ?Target Date: 2023-03-27 Frequency: Weekly  ?Progress: 0 Modality: individual  ?Related Interventions ?Therapist will help Jezel to consider organizational strategies in her day-to-day life, incorporating manualized treatments such as [name] ?Therapist will provide opportunities to process  parenting experiences in session and incorporate strategies from manualized parent training programs ?2. Adilynn frequently experiences feelings of overwhelm that are difficult to cope with ?Objective ?Annaly would like to develop coping strategies and reduce the frequency of meltdowns  ?Target Date: 2023-03-27 Frequency: Weekly  ?Progress: 0 Modality: individual  ?Related Interventions ?Jessalyn will be provided opportunities to process experiences in sessions ?Therapist will provide referrals for additional resources as appropriate  ?Therapist will help Marti to identify and disengage from maladaptive thoughts and behaviors using CBT-based strategies ?Therapist will provide emotion regulation strategies including mindfulness, relaxation, and self care techniques ?Diagnosis ?Axis none 300.02 (Generalized anxiety disorder) - Open - [Signifier: n/a]    ?Axis none 314.01 (Attention deficit disorder with hyperactivity) - Open - [Signifier: n/a]    ?Conditions For Discharge ?Achievement of treatment goals and objectives ? ? ? ? ? ? ? ? ? ? ? ? ? ? ? ?[name], PhD ? ? ? ? ? ? ? ? ? ? ? ? ? ? ?[name], PhD ? ? ? ? ? ? ? ? ? ? ? ? ? ? ?[name], PhD ?

## 2022-05-03 ENCOUNTER — Ambulatory Visit (INDEPENDENT_AMBULATORY_CARE_PROVIDER_SITE_OTHER): Payer: 59 | Admitting: Clinical

## 2022-05-03 DIAGNOSIS — F331 Major depressive disorder, recurrent, moderate: Secondary | ICD-10-CM

## 2022-05-03 DIAGNOSIS — F411 Generalized anxiety disorder: Secondary | ICD-10-CM | POA: Diagnosis not present

## 2022-05-03 DIAGNOSIS — F902 Attention-deficit hyperactivity disorder, combined type: Secondary | ICD-10-CM | POA: Diagnosis not present

## 2022-05-03 NOTE — Progress Notes (Signed)
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 ?Svea was seen virtually using secure video conferencing. She was in a parked car in the parking lot of [employer] in [place], and the therapist was in her office. She had messaged the therapist with topics for therapy, and session began by reviewing these topics. She shared more details about dynamics in her family, and therapist pointed out unclear boundaries with her husband and son with regard to emotions and behaviors. For homework, she will read pages 1-13 and complete accompanying exercises in the DBT Skills Workbook. She denied any self harm in the last week. She is scheduled to be seen again in two weeks. ? 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 She expresses herself well in writing.  ?Client Treatment Preferences  ?Marvin prefers in-person sessions, during school hours  ?Client Statement of Needs  ?Vlasta shared that she is seeking parenting skills, as well as healthier coping skills.  ?Treatment Level  ?Weekly  ?Symptoms  ?Anxiety : restlessness, difficulty relaxing, racing thoughts, difficulty concentrating, difficulty with creating and adhering to plans and routines (Status: maintained). Depression: tearfulness (Status: maintained).  ?Problems Addressed  ?New Description, New Description  ?Goals ?1. Sahiti experiences difficulty with executive functioning ?Objective ?Verdelle will develop and implement organizational strategies ?Target Date: 2023-03-27 Frequency: Weekly  ?Progress: 0 Modality: individual  ?Related Interventions ?Therapist will help Elenore to consider organizational strategies in her day-to-day life, incorporating manualized treatments such as [name] ?Therapist will provide opportunities to process parenting experiences in session and incorporate strategies from manualized parent  training programs ?2. Zeynep frequently experiences feelings of overwhelm that are difficult to cope with ?Objective ?Korbyn would like to develop coping strategies and reduce the frequency of meltdowns  ?Target Date: 2023-03-27 Frequency: Weekly  ?Progress: 0 Modality: individual  ?Related Interventions ?Dannia will be provided opportunities to process experiences in sessions ?Therapist will provide referrals for additional resources as appropriate  ?Therapist will help Tirza to identify and disengage from maladaptive thoughts and behaviors using CBT-based strategies ?Therapist will provide emotion regulation strategies including mindfulness, relaxation, and self care techniques ?Diagnosis ?Axis none 300.02 (Generalized anxiety disorder) - Open - [Signifier: n/a]    ?Axis none 314.01 (Attention deficit disorder with hyperactivity) - Open - [Signifier: n/a]    ?Conditions For Discharge ?Achievement of treatment goals and objectives ? ? ? ? ? ? ? ? ? ? ? ? ? ? ? ?[name], PhD ? ? ? ? ? ? ? ? ? ? ? ? ? ? ?[name], PhD ? ? ? ? ? ? ? ? ? ? ? ? ? ? ?[name], PhDIn front of [employer] in [place]-parked car ? ? ? ? ? ? ? ? ? ? ? ? ? ? ?[name], PhD ?

## 2022-05-19 ENCOUNTER — Other Ambulatory Visit: Payer: Self-pay | Admitting: Family Medicine

## 2022-05-21 MED ORDER — CLONAZEPAM 0.5 MG PO TABS: 0.5000 mg | ORAL_TABLET | Freq: Every day | ORAL | 0 refills | Status: DC

## 2022-05-21 MED ORDER — ZALEPLON 5 MG PO CAPS: 5.0000 mg | ORAL_CAPSULE | Freq: Every day | ORAL | 0 refills | Status: DC

## 2022-05-21 NOTE — Telephone Encounter (Signed)
Last office visit 03/12/22 for follow up GAD/MDD.  Last refilled Sonata 04/22/22 for #30 with no refills.  Clonazepam 04/22/22 for #30 with no refills.  CPE scheduled 06/18/22.

## 2022-05-23 ENCOUNTER — Encounter: Payer: Self-pay | Admitting: Family Medicine

## 2022-05-24 ENCOUNTER — Other Ambulatory Visit: Payer: Self-pay | Admitting: Family Medicine

## 2022-05-24 ENCOUNTER — Ambulatory Visit (INDEPENDENT_AMBULATORY_CARE_PROVIDER_SITE_OTHER): Payer: 59 | Admitting: Clinical

## 2022-05-24 DIAGNOSIS — F411 Generalized anxiety disorder: Secondary | ICD-10-CM

## 2022-05-24 DIAGNOSIS — E538 Deficiency of other specified B group vitamins: Secondary | ICD-10-CM

## 2022-05-24 DIAGNOSIS — F331 Major depressive disorder, recurrent, moderate: Secondary | ICD-10-CM | POA: Diagnosis not present

## 2022-05-24 DIAGNOSIS — F902 Attention-deficit hyperactivity disorder, combined type: Secondary | ICD-10-CM | POA: Diagnosis not present

## 2022-05-24 NOTE — Progress Notes (Signed)
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 She had completed pages 9-13 from the DBT manual, and shared that she was unsure how these strategies would help her to manage her husband's angry outbursts. Therapist processed this with her and suggested that her husband may need to attend individual therapy to work through these issues. Session focused on processing dynamics in their relationship to help Mariam better understand what her goals are for the marriage. For homework, she will read chapter 9 from the DBT manual concerning interpersonal effectiveness. She is scheduled to be seen again in one week.  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 She expresses herself well in writing.  Client Treatment Preferences  Daesha prefers in-person sessions, during school hours  Client Statement of Needs  Kennia shared that she is seeking parenting skills, as well as healthier coping skills.  Treatment Level  Weekly  Symptoms  Anxiety : restlessness, difficulty relaxing, racing thoughts, difficulty concentrating, difficulty with creating and adhering to plans and routines (Status: maintained). Depression: tearfulness (Status: maintained).  Problems Addressed  New Description, New Description  Goals 1. [name] experiences difficulty with executive functioning Objective Kamauri will develop and implement organizational strategies Target Date: 2023-03-27 Frequency: Weekly  Progress: 0 Modality: individual  Related Interventions Therapist will help Reagan to consider organizational strategies in her day-to-day life, incorporating manualized treatments such as [name] Therapist will provide opportunities to process parenting experiences in session and incorporate strategies from manualized parent training programs 2. Lanna  frequently experiences feelings of overwhelm that are difficult to cope with Objective Kenae would like to develop coping strategies and reduce the frequency of meltdowns  Target Date: 2023-03-27 Frequency: Weekly  Progress: 0 Modality: individual  Related Interventions Tanicka will be provided opportunities to process experiences in sessions Therapist will provide referrals for additional resources as appropriate  Therapist will help Yoshino to identify and disengage from maladaptive thoughts and behaviors using CBT-based strategies Therapist will provide emotion regulation strategies including mindfulness, relaxation, and self care techniques Diagnosis Axis none 300.02 (Generalized anxiety disorder) - Open - [Signifier: n/a]    Axis none 314.01 (Attention deficit disorder with hyperactivity) - Open - [Signifier: n/a]    Conditions For Discharge Achievement of treatment goals and objectives                  [name], PhD               [name], PhD

## 2022-05-31 ENCOUNTER — Ambulatory Visit (INDEPENDENT_AMBULATORY_CARE_PROVIDER_SITE_OTHER): Payer: 59 | Admitting: Clinical

## 2022-05-31 DIAGNOSIS — F411 Generalized anxiety disorder: Secondary | ICD-10-CM

## 2022-05-31 DIAGNOSIS — F331 Major depressive disorder, recurrent, moderate: Secondary | ICD-10-CM

## 2022-05-31 DIAGNOSIS — F902 Attention-deficit hyperactivity disorder, combined type: Secondary | ICD-10-CM

## 2022-05-31 NOTE — Progress Notes (Signed)
Time: 9:03am-10:00am CPT Code: 00867Y (IN PERSON) Diagnosis Code: F90.2, F41.1, F33.2  Andrea Colon was seen in person for therapy. She had completed her homework of reading the interpersonal effectiveness chapter of the DBT book and had written notes to share with the therapist. Session focused on considering how to set boundaries with her father. In talking this through, therapist pointed out Andrea Colon's tendency to avoid her own emotions, and worked with her to consider how to acknowledge her emotions, using her ability to do so with her son as a model. She is scheduled to be seen again in one week.  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 She expresses herself well in writing.  Client Treatment Preferences  [name] prefers in-person sessions, during school hours  Client Statement of Needs  Andrea Colon shared that she is seeking parenting skills, as well as healthier coping skills.  Treatment Level  Weekly  Symptoms  Anxiety : restlessness, difficulty relaxing, racing thoughts, difficulty concentrating, difficulty with creating and adhering to plans and routines (Status: maintained). Depression: tearfulness (Status: maintained).  Problems Addressed  New Description, New Description  Goals 1. Andrea Colon experiences difficulty with executive functioning Objective Andrea Colon will develop and implement organizational strategies Target Date: 2023-03-27 Frequency: Weekly  Progress: 0 Modality: individual  Related Interventions Therapist will help Andrea Colon to consider organizational strategies in her day-to-day life, incorporating manualized treatments such as [name] Therapist will provide opportunities to process parenting experiences in session and incorporate strategies from manualized parent training programs 2. Andrea Colon frequently experiences feelings of overwhelm that are difficult to cope  with Objective Andrea Colon would like to develop coping strategies and reduce the frequency of meltdowns  Target Date: 2023-03-27 Frequency: Weekly  Progress: 0 Modality: individual  Related Interventions Andrea Colon will be provided opportunities to process experiences in sessions Therapist will provide referrals for additional resources as appropriate  Therapist will help Andrea Colon to identify and disengage from maladaptive thoughts and behaviors using CBT-based strategies Therapist will provide emotion regulation strategies including mindfulness, relaxation, and self care techniques Diagnosis Axis none 300.02 (Generalized anxiety disorder) - Open - [Signifier: n/a]    Axis none 314.01 (Attention deficit disorder with hyperactivity) - Open - [Signifier: n/a]    Conditions For Discharge Achievement of treatment goals and objectives         [name], PhD               [name], PhD

## 2022-06-06 ENCOUNTER — Ambulatory Visit (INDEPENDENT_AMBULATORY_CARE_PROVIDER_SITE_OTHER): Payer: 59 | Admitting: Clinical

## 2022-06-06 DIAGNOSIS — F411 Generalized anxiety disorder: Secondary | ICD-10-CM | POA: Diagnosis not present

## 2022-06-06 DIAGNOSIS — F331 Major depressive disorder, recurrent, moderate: Secondary | ICD-10-CM

## 2022-06-06 DIAGNOSIS — F902 Attention-deficit hyperactivity disorder, combined type: Secondary | ICD-10-CM
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
## 2022-06-11 ENCOUNTER — Other Ambulatory Visit (INDEPENDENT_AMBULATORY_CARE_PROVIDER_SITE_OTHER): Payer: 59

## 2022-06-11 ENCOUNTER — Encounter: Payer: Self-pay | Admitting: Family Medicine

## 2022-06-11 DIAGNOSIS — E538 Deficiency of other specified B group vitamins: Secondary | ICD-10-CM

## 2022-06-11 DIAGNOSIS — E781 Pure hyperglyceridemia: Secondary | ICD-10-CM

## 2022-06-11 DIAGNOSIS — E559 Vitamin D deficiency, unspecified: Secondary | ICD-10-CM | POA: Diagnosis not present

## 2022-06-11 DIAGNOSIS — R7303 Prediabetes: Secondary | ICD-10-CM

## 2022-06-11 DIAGNOSIS — Z862 Personal history of diseases of the blood and blood-forming organs and certain disorders involving the immune mechanism: Secondary | ICD-10-CM

## 2022-06-11 LAB — CBC WITH DIFFERENTIAL/PLATELET
Basophils Absolute: 0 10*3/uL (ref 0.0–0.1)
Basophils Relative: 0.6 % (ref 0.0–3.0)
Eosinophils Absolute: 0.1 10*3/uL (ref 0.0–0.7)
Eosinophils Relative: 1.9 % (ref 0.0–5.0)
HCT: 40.2 % (ref 36.0–46.0)
Hemoglobin: 13.2 g/dL (ref 12.0–15.0)
Lymphocytes Relative: 24.4 % (ref 12.0–46.0)
Lymphs Abs: 1.8 10*3/uL (ref 0.7–4.0)
MCHC: 32.9 g/dL (ref 30.0–36.0)
MCV: 85.5 fl (ref 78.0–100.0)
Monocytes Absolute: 0.4 10*3/uL (ref 0.1–1.0)
Monocytes Relative: 5.4 % (ref 3.0–12.0)
Neutro Abs: 5 10*3/uL (ref 1.4–7.7)
Neutrophils Relative %: 67.7 % (ref 43.0–77.0)
Platelets: 325 10*3/uL (ref 150.0–400.0)
RBC: 4.7 Mil/uL (ref 3.87–5.11)
RDW: 14.4 % (ref 11.5–15.5)
WBC: 7.4 10*3/uL (ref 4.0–10.5)

## 2022-06-11 LAB — COMPREHENSIVE METABOLIC PANEL
ALT: 11 U/L (ref 0–35)
AST: 12 U/L (ref 0–37)
Albumin: 4.2 g/dL (ref 3.5–5.2)
Alkaline Phosphatase: 44 U/L (ref 39–117)
BUN: 12 mg/dL (ref 6–23)
CO2: 29 mEq/L (ref 19–32)
Calcium: 9.4 mg/dL (ref 8.4–10.5)
Chloride: 100 mEq/L (ref 96–112)
Creatinine, Ser: 0.84 mg/dL (ref 0.40–1.20)
GFR: 83.18 mL/min (ref >60.00)
Glucose, Bld: 94 mg/dL (ref 70–99)
Potassium: 4 mEq/L (ref 3.5–5.1)
Sodium: 136 mEq/L (ref 135–145)
Total Bilirubin: 0.4 mg/dL (ref 0.2–1.2)
Total Protein: 7.1 g/dL (ref 6.0–8.3)

## 2022-06-11 LAB — LIPID PANEL
Cholesterol: 162 mg/dL (ref 0–200)
HDL: 34.9 mg/dL — ABNORMAL LOW (ref >39.00)
LDL Cholesterol: 95 mg/dL (ref 0–99)
NonHDL: 127.24
Total CHOL/HDL Ratio: 5
Triglycerides: 163 mg/dL — ABNORMAL HIGH (ref 0.0–149.0)
VLDL: 32.6 mg/dL (ref 0.0–40.0)

## 2022-06-11 LAB — IBC + FERRITIN
Ferritin: 14.5 ng/mL (ref 10.0–291.0)
Iron: 60 ug/dL (ref 42–145)
Saturation Ratios: 14.6 % — ABNORMAL LOW (ref 20.0–50.0)
TIBC: 411.6 ug/dL (ref 250.0–450.0)
Transferrin: 294 mg/dL (ref 212.0–360.0)

## 2022-06-11 LAB — HEMOGLOBIN A1C: Hgb A1c MFr Bld: 5.3 % (ref 4.6–6.5)

## 2022-06-11 LAB — VITAMIN D 25 HYDROXY (VIT D DEFICIENCY, FRACTURES): VITD: 35.63 ng/mL (ref 30.00–100.00)

## 2022-06-11 LAB — VITAMIN B12: Vitamin B-12: 1168 pg/mL — ABNORMAL HIGH (ref 211–911)

## 2022-06-12 LAB — INSULIN, RANDOM: Insulin: 17.6 u[IU]/mL

## 2022-06-12 NOTE — Progress Notes (Signed)
No critical labs need to be addressed urgently. We will discuss labs in detail at upcoming office visit.   

## 2022-06-13 ENCOUNTER — Encounter: Payer: Self-pay | Admitting: Family Medicine

## 2022-06-13 ENCOUNTER — Encounter (INDEPENDENT_AMBULATORY_CARE_PROVIDER_SITE_OTHER): Payer: Self-pay | Admitting: Family Medicine

## 2022-06-13 NOTE — Telephone Encounter (Signed)
FYI

## 2022-06-16 ENCOUNTER — Other Ambulatory Visit: Payer: Self-pay | Admitting: Family Medicine

## 2022-06-17 MED ORDER — CLONAZEPAM 0.5 MG PO TABS: 0.5000 mg | ORAL_TABLET | Freq: Every day | ORAL | 0 refills | Status: DC

## 2022-06-17 MED ORDER — ZALEPLON 5 MG PO CAPS: 5.0000 mg | ORAL_CAPSULE | Freq: Every day | ORAL | 0 refills | Status: DC

## 2022-06-18 ENCOUNTER — Ambulatory Visit (INDEPENDENT_AMBULATORY_CARE_PROVIDER_SITE_OTHER): Payer: 59 | Admitting: Family Medicine

## 2022-06-18 VITALS — BP 100/70 | HR 88 | Temp 97.5°F | Ht 64.0 in | Wt 215.5 lb

## 2022-06-18 DIAGNOSIS — F330 Major depressive disorder, recurrent, mild: Secondary | ICD-10-CM

## 2022-06-18 DIAGNOSIS — Z0000 Encounter for general adult medical examination without abnormal findings: Secondary | ICD-10-CM

## 2022-06-18 DIAGNOSIS — N938 Other specified abnormal uterine and vaginal bleeding: Secondary | ICD-10-CM

## 2022-06-18 DIAGNOSIS — Z1211 Encounter for screening for malignant neoplasm of colon: Secondary | ICD-10-CM

## 2022-06-18 DIAGNOSIS — I10 Essential (primary) hypertension: Secondary | ICD-10-CM | POA: Diagnosis not present

## 2022-06-18 DIAGNOSIS — R7303 Prediabetes: Secondary | ICD-10-CM | POA: Diagnosis not present

## 2022-06-18 NOTE — Progress Notes (Signed)
Patient ID: Andrea Colon, female    DOB: 1975/12/28, 46 y.o.   MRN: [id number]  This visit was conducted in person.  BP 100/70   Pulse 88   Temp (!) 97.5 F (36.4 C) (Temporal)   Ht '5\' 4"'$  (1.626 m)   Wt 215 lb 8 oz (97.8 kg)   LMP 05/25/2022 (Exact Date)   SpO2 98%   BMI 36.99 kg/m    CC:  Chief Complaint  Patient presents with   Annual Exam    No concerns     Subjective:   HPI: Andrea Colon is a 46 y.o. female presenting on 06/18/2022 for Annual Exam (No concerns )   Prediabetes  Lab Results  Component Value Date   HGBA1C 5.3 06/11/2022   Hypertension:    Well controlled. On spironolactone 50 mg daily more acne . BP Readings from Last 3 Encounters:  06/18/22 100/70  03/21/22 112/76  03/12/22 120/84  Using medication without problems or lightheadedness:   occ Chest pain with exertion: none Edema: none Short of breath: none Average home BPs: Other issues:   Her back pain is improving. She has been  30 min every few days.Marland Kitchen 1.61 mi Wt Readings from Last 3 Encounters:  06/18/22 215 lb 8 oz (97.8 kg)  03/21/22 234 lb (106.1 kg)  03/12/22 243 lb (110.2 kg)  Body mass index is 36.99 kg/m.       GAD, MDD,  chronic insomnia ADD:  Good control on lexapro 10 mg daily, vyvanse 40 mg daily/ adderall 10 mg BID prn.  Clonazepam 0.5 mg prn, using sonata 5 mg prn insomnia    She has been noting increased length on menses 7-10 days,  occurring once a month,heavier,  cahnging pad/tampon every hour on heaviest day.  Increase in abd cramping  On ferrous sulfate, hg during menses. Hx of uterine fibroids.. hx of D and C.   Has copper  IUD in place since 2016  Relevant past medical, surgical, family and social history reviewed and updated as indicated. Interim medical history since our last visit reviewed. Allergies and medications reviewed and updated. Outpatient Medications Prior to Visit  Medication Sig Dispense Refill   amphetamine-dextroamphetamine  (ADDERALL) 10 MG tablet Take 10 mg by mouth 2 (two) times daily.     azelastine (ASTELIN) 0.1 % nasal spray Place 1 spray into both nostrils 2 (two) times daily. Use in each nostril as directed 30 mL 0   clonazePAM (KLONOPIN) 0.5 MG tablet Take 1 tablet (0.5 mg total) by mouth at bedtime. 30 tablet 0   escitalopram (LEXAPRO) 10 MG tablet Take 1 tablet (10 mg total) by mouth daily. 90 tablet 0   lisdexamfetamine (VYVANSE) 40 MG capsule Take 1 capsule (40 mg total) by mouth daily before breakfast. 30 capsule 0   PARAGARD INTRAUTERINE COPPER IU 1 Device by Intrauterine route once.      spironolactone (ALDACTONE) 50 MG tablet Take 50 mg by mouth daily.     tretinoin (RETIN-A) 0.025 % cream as needed.     Vitamin D, Ergocalciferol, (DRISDOL) 1.25 MG (50000 UNIT) CAPS capsule Take 1 capsule (50,000 Units total) by mouth every 7 (seven) days. 12 capsule 0   zaleplon (SONATA) 5 MG capsule Take 1 capsule (5 mg total) by mouth at bedtime. 30 capsule 0   No facility-administered medications prior to visit.     Per HPI unless specifically indicated in ROS section below Review of Systems  Constitutional:  Negative for fatigue  and fever.  HENT:  Negative for congestion.   Eyes:  Negative for pain.  Respiratory:  Negative for cough and shortness of breath.   Cardiovascular:  Negative for chest pain, palpitations and leg swelling.  Gastrointestinal:  Negative for abdominal pain.  Genitourinary:  Negative for dysuria and vaginal bleeding.  Musculoskeletal:  Negative for back pain.  Neurological:  Negative for syncope, light-headedness and headaches.  Psychiatric/Behavioral:  Negative for dysphoric mood.    Objective:  BP 100/70   Pulse 88   Temp (!) 97.5 F (36.4 C) (Temporal)   Ht '5\' 4"'$  (1.626 m)   Wt 215 lb 8 oz (97.8 kg)   LMP 05/25/2022 (Exact Date)   SpO2 98%   BMI 36.99 kg/m   Wt Readings from Last 3 Encounters:  06/18/22 215 lb 8 oz (97.8 kg)  03/21/22 234 lb (106.1 kg)  03/12/22 243  lb (110.2 kg)      Physical Exam Vitals and nursing note reviewed.  Constitutional:      General: She is not in acute distress.    Appearance: Normal appearance. She is well-developed. She is not ill-appearing or toxic-appearing.  HENT:     Head: Normocephalic.     Right Ear: Hearing, tympanic membrane, ear canal and external ear normal.     Left Ear: Hearing, tympanic membrane, ear canal and external ear normal.     Nose: Nose normal.  Eyes:     General: Lids are normal. Lids are everted, no foreign bodies appreciated.     Conjunctiva/sclera: Conjunctivae normal.     Pupils: Pupils are equal, round, and reactive to light.  Neck:     Thyroid: No thyroid mass or thyromegaly.     Vascular: No carotid bruit.     Trachea: Trachea normal.  Cardiovascular:     Rate and Rhythm: Normal rate and regular rhythm.     Heart sounds: Normal heart sounds, S1 normal and S2 normal. No murmur heard.    No gallop.  Pulmonary:     Effort: Pulmonary effort is normal. No respiratory distress.     Breath sounds: Normal breath sounds. No wheezing, rhonchi or rales.  Abdominal:     General: Bowel sounds are normal. There is no distension or abdominal bruit.     Palpations: Abdomen is soft. There is no fluid wave or mass.     Tenderness: There is no abdominal tenderness. There is no guarding or rebound.     Hernia: No hernia is present.  Musculoskeletal:     Cervical back: Normal range of motion and neck supple.  Lymphadenopathy:     Cervical: No cervical adenopathy.  Skin:    General: Skin is warm and dry.     Findings: No rash.  Neurological:     Mental Status: She is alert.     Cranial Nerves: No cranial nerve deficit.     Sensory: No sensory deficit.  Psychiatric:        Mood and Affect: Mood is not anxious or depressed.        Speech: Speech normal.        Behavior: Behavior normal. Behavior is cooperative.        Judgment: Judgment normal.       Results for orders placed or performed  in visit on 06/11/22  Vitamin B12  Result Value Ref Range   Vitamin B-12 1,168 (H) 211 - 911 pg/mL  CBC with Differential/Platelet  Result Value Ref Range   WBC 7.4 4.0 -  10.5 K/uL   RBC 4.70 3.87 - 5.11 Mil/uL   Hemoglobin 13.2 12.0 - 15.0 g/dL   HCT 40.2 36.0 - 46.0 %   MCV 85.5 78.0 - 100.0 fl   MCHC 32.9 30.0 - 36.0 g/dL   RDW 14.4 11.5 - 15.5 %   Platelets 325.0 150.0 - 400.0 K/uL   Neutrophils Relative % 67.7 43.0 - 77.0 %   Lymphocytes Relative 24.4 12.0 - 46.0 %   Monocytes Relative 5.4 3.0 - 12.0 %   Eosinophils Relative 1.9 0.0 - 5.0 %   Basophils Relative 0.6 0.0 - 3.0 %   Neutro Abs 5.0 1.4 - 7.7 K/uL   Lymphs Abs 1.8 0.7 - 4.0 K/uL   Monocytes Absolute 0.4 0.1 - 1.0 K/uL   Eosinophils Absolute 0.1 0.0 - 0.7 K/uL   Basophils Absolute 0.0 0.0 - 0.1 K/uL  Comprehensive metabolic panel  Result Value Ref Range   Sodium 136 135 - 145 mEq/L   Potassium 4.0 3.5 - 5.1 mEq/L   Chloride 100 96 - 112 mEq/L   CO2 29 19 - 32 mEq/L   Glucose, Bld 94 70 - 99 mg/dL   BUN 12 6 - 23 mg/dL   Creatinine, Ser 0.84 0.40 - 1.20 mg/dL   Total Bilirubin 0.4 0.2 - 1.2 mg/dL   Alkaline Phosphatase 44 39 - 117 U/L   AST 12 0 - 37 U/L   ALT 11 0 - 35 U/L   Total Protein 7.1 6.0 - 8.3 g/dL   Albumin 4.2 3.5 - 5.2 g/dL   GFR 83.18 >60.00 mL/min   Calcium 9.4 8.4 - 10.5 mg/dL  Hemoglobin A1c  Result Value Ref Range   Hgb A1c MFr Bld 5.3 4.6 - 6.5 %  IBC + Ferritin  Result Value Ref Range   Iron 60 42 - 145 ug/dL   Transferrin 294.0 212.0 - 360.0 mg/dL   Saturation Ratios 14.6 (L) 20.0 - 50.0 %   Ferritin 14.5 10.0 - 291.0 ng/mL   TIBC 411.6 250.0 - 450.0 mcg/dL  Insulin, random  Result Value Ref Range   Insulin 17.6 uIU/mL  Lipid panel  Result Value Ref Range   Cholesterol 162 0 - 200 mg/dL   Triglycerides 163.0 (H) 0.0 - 149.0 mg/dL   HDL 34.90 (L) >39.00 mg/dL   VLDL 32.6 0.0 - 40.0 mg/dL   LDL Cholesterol 95 0 - 99 mg/dL   Total CHOL/HDL Ratio 5    NonHDL 127.24    VITAMIN D 25 Hydroxy (Vit-D Deficiency, Fractures)  Result Value Ref Range   VITD 35.63 30.00 - 100.00 ng/mL     COVID 19 screen:  No recent travel or known exposure to COVID19 The patient denies respiratory symptoms of COVID 19 at this time. The importance of social distancing was discussed today.   Assessment and Plan   The patient's preventative maintenance and recommended screening tests for an annual wellness exam were reviewed in full today. Brought up to date unless services declined.  Counselled on the importance of diet, exercise, and its role in overall health and mortality. The patient's FH and SH was reviewed, including their home life, tobacco status, and drug and alcohol status.   Vaccines: Last Tdap in  2013, COVID x 2 STD screen: refused Colon: no early family history... due to start screening... referral sent Mammogram: 05/11/21 repeat low Birads PAP/DVE:  nml pap, HPV neg GYN 08/2018.  No ETOH, no smoking  Hep C due  Problem List Items Addressed  This Visit     Depression, major, recurrent, mild (HCC) (Chronic)    Chronic, Good control on lexapro 10 mg daily, vyvanse 40 mg daily/ adderall 10 mg BID prn.  Clonazepam 0.5 mg prn, using sonata 5 mg prn insomnia      DUB (dysfunctional uterine bleeding)    Can use ibuprofen 600 mg three times daily as needed for menses cramps. Follow up if menses continue to be heavier and painful for vaginal exam and [place] referral.  Hx of uterine fibroids.. hx of D and C.   Has copper  IUD in place since 2016       Essential hypertension, benign    Stable, chronic.  Continue current medication.    Spironolactone 50 mg p.o. daily      Morbid obesity ([place])    Encouraged exercise, weight loss, healthy eating habits.       Relevant Medications   amphetamine-dextroamphetamine (ADDERALL) 10 MG tablet   Prediabetes    Improved control with weight loss and lifestyle changes.      Other Visit Diagnoses     Routine  general medical examination at a health care facility    -  Primary   Colon cancer screening       Relevant Orders   Ambulatory referral to Gastroenterology        Eliezer Lofts, MD

## 2022-06-19 ENCOUNTER — Encounter: Payer: Self-pay | Admitting: Family Medicine

## 2022-06-19 ENCOUNTER — Telehealth: Payer: Self-pay

## 2022-06-19 ENCOUNTER — Other Ambulatory Visit: Payer: Self-pay

## 2022-06-19 ENCOUNTER — Other Ambulatory Visit: Payer: Self-pay | Admitting: Family Medicine

## 2022-06-19 DIAGNOSIS — Z1211 Encounter for screening for malignant neoplasm of colon: Secondary | ICD-10-CM

## 2022-06-19 DIAGNOSIS — Z1231 Encounter for screening mammogram for malignant neoplasm of breast: Secondary | ICD-10-CM

## 2022-06-19 NOTE — Telephone Encounter (Signed)
Gastroenterology Pre-Procedure Review  Request Date: 09/06/22 Requesting Physician: Dr. Marius Ditch  PATIENT REVIEW QUESTIONS: The patient responded to the following health history questions as indicated:    1. Are you having any GI issues? no 2. Do you have a personal history of Polyps? no 3. Do you have a family history of Colon Cancer or Polyps? no 4. Diabetes Mellitus? no 5. Joint replacements in the past 12 months?no 6. Major health problems in the past 3 months?no 7. Any artificial heart valves, MVP, or defibrillator?no    MEDICATIONS & ALLERGIES:    Patient reports the following regarding taking any anticoagulation/antiplatelet therapy:   Plavix, Coumadin, Eliquis, Xarelto, Lovenox, Pradaxa, Brilinta, or Effient? no Aspirin? no  Patient confirms/reports the following medications:  Current Outpatient Medications  Medication Sig Dispense Refill   amphetamine-dextroamphetamine (ADDERALL) 10 MG tablet Take 10 mg by mouth 2 (two) times daily.     azelastine (ASTELIN) 0.1 % nasal spray Place 1 spray into both nostrils 2 (two) times daily. Use in each nostril as directed 30 mL 0   clonazePAM (KLONOPIN) 0.5 MG tablet Take 1 tablet (0.5 mg total) by mouth at bedtime. 30 tablet 0   escitalopram (LEXAPRO) 10 MG tablet Take 1 tablet (10 mg total) by mouth daily. 90 tablet 0   lisdexamfetamine (VYVANSE) 40 MG capsule Take 1 capsule (40 mg total) by mouth daily before breakfast. 30 capsule 0   PARAGARD INTRAUTERINE COPPER IU 1 Device by Intrauterine route once.      spironolactone (ALDACTONE) 50 MG tablet Take 50 mg by mouth daily.     tretinoin (RETIN-A) 0.025 % cream as needed.     Vitamin D, Ergocalciferol, (DRISDOL) 1.25 MG (50000 UNIT) CAPS capsule Take 1 capsule (50,000 Units total) by mouth every 7 (seven) days. 12 capsule 0   zaleplon (SONATA) 5 MG capsule Take 1 capsule (5 mg total) by mouth at bedtime. 30 capsule 0   No current facility-administered medications for this visit.     Patient confirms/reports the following allergies:  Allergies  Allergen Reactions   Sulfa Antibiotics Hives    No orders of the defined types were placed in this encounter.   AUTHORIZATION INFORMATION Primary Insurance: 1D#: Group #:  Secondary Insurance: 1D#: Group #:  SCHEDULE INFORMATION: Date: 09/06/22 Time: Location: [facility]

## 2022-06-20 ENCOUNTER — Ambulatory Visit (INDEPENDENT_AMBULATORY_CARE_PROVIDER_SITE_OTHER): Payer: 59 | Admitting: Clinical

## 2022-06-20 ENCOUNTER — Encounter: Payer: Self-pay | Admitting: Family Medicine

## 2022-06-20 DIAGNOSIS — F411 Generalized anxiety disorder: Secondary | ICD-10-CM

## 2022-06-20 DIAGNOSIS — F902 Attention-deficit hyperactivity disorder, combined type: Secondary | ICD-10-CM | POA: Diagnosis not present

## 2022-06-20 DIAGNOSIS — F331 Major depressive disorder, recurrent, moderate: Secondary | ICD-10-CM | POA: Diagnosis not present

## 2022-06-20 NOTE — Progress Notes (Signed)
Time: 9:02am-10:00am CPT Code: 67672C (IN PERSON) Diagnosis Code: F90.2, F41.1, F33.2  Andrea Colon was seen in person for therapy. She shared that she has been reflecting further upon whether she meets criteria for borderline personality disorder. Therapist processed a recent interaction with her weight loss doctor (who has become her "favorite person") with her, providing validation and support. Therapist encouraged her to try to be more specific when she notices herself using the words "crazy," and "weird." She is scheduled to be seen again in one week.  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 She expresses herself well in writing.  Client Treatment Preferences  Andrea Colon prefers in-person sessions, during school hours  Client Statement of Needs  Andrea Colon shared that she is seeking parenting skills, as well as healthier coping skills.  Treatment Level  Weekly  Symptoms  Anxiety : restlessness, difficulty relaxing, racing thoughts, difficulty concentrating, difficulty with creating and adhering to plans and routines (Status: maintained). Depression: tearfulness (Status: maintained).  Problems Addressed  New Description, New Description  Goals 1. Andrea Colon experiences difficulty with executive functioning Objective Andrea Colon will develop and implement organizational strategies Target Date: 2023-03-27 Frequency: Weekly  Progress: 0 Modality: individual  Related Interventions Therapist will help Analaya to consider organizational strategies in her day-to-day life, incorporating manualized treatments such as [name] Therapist will provide opportunities to process parenting experiences in session and incorporate strategies from manualized parent training programs 2. Andrea Colon frequently experiences feelings of overwhelm that are difficult to cope with Objective Andrea Colon would like to develop coping strategies  and reduce the frequency of meltdowns  Target Date: 2023-03-27 Frequency: Weekly  Progress: 0 Modality: individual  Related Interventions Andrea Colon will be provided opportunities to process experiences in sessions Therapist will provide referrals for additional resources as appropriate  Therapist will help Andrea Colon to identify and disengage from maladaptive thoughts and behaviors using CBT-based strategies Therapist will provide emotion regulation strategies including mindfulness, relaxation, and self care techniques Diagnosis Axis none 300.02 (Generalized anxiety disorder) - Open - [Signifier: n/a]    Axis none 314.01 (Attention deficit disorder with hyperactivity) - Open - [Signifier: n/a]    Conditions For Discharge Achievement of treatment goals and objectives         Andrea Cruise, PhD               Andrea Cruise, PhD

## 2022-06-27 ENCOUNTER — Ambulatory Visit (INDEPENDENT_AMBULATORY_CARE_PROVIDER_SITE_OTHER): Payer: 59 | Admitting: Clinical

## 2022-06-27 DIAGNOSIS — F411 Generalized anxiety disorder: Secondary | ICD-10-CM | POA: Diagnosis not present

## 2022-06-27 DIAGNOSIS — F902 Attention-deficit hyperactivity disorder, combined type: Secondary | ICD-10-CM | POA: Diagnosis not present

## 2022-06-27 DIAGNOSIS — F331 Major depressive disorder, recurrent, moderate: Secondary | ICD-10-CM | POA: Diagnosis not present

## 2022-06-27 NOTE — Progress Notes (Signed)
Time: 11:02am-12:00pm CPT Code: 93903E (IN PERSON) Diagnosis Code: F90.2, F41.1, F33.2  Andrea Colon was seen in person for therapy. She shared that had spoken with her PCP and the conversation had gone well. Session focused on her dependence on closely restricting and logging her food. Therapist encouraged her to explore the emotions she may be attempting to regulate by doing so, especially her sense that "it will fall apart" if she stops. She is scheduled to be seen again in one week.  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 She expresses herself well in writing.  Client Treatment Preferences  Andrea Colon prefers in-person sessions, during school hours  Client Statement of Needs  Andrea Colon shared that she is seeking parenting skills, as well as healthier coping skills.  Treatment Level  Weekly  Symptoms  Anxiety : restlessness, difficulty relaxing, racing thoughts, difficulty concentrating, difficulty with creating and adhering to plans and routines (Status: maintained). Depression: tearfulness (Status: maintained).  Problems Addressed  New Description, New Description  Goals 1. Andrea Colon experiences difficulty with executive functioning Objective Andrea Colon will develop and implement organizational strategies Target Date: 2023-03-27 Frequency: Weekly  Progress: 0 Modality: individual  Related Interventions Therapist will help Randa to consider organizational strategies in her day-to-day life, incorporating manualized treatments such as [name] Therapist will provide opportunities to process parenting experiences in session and incorporate strategies from manualized parent training programs 2. Andrea Colon frequently experiences feelings of overwhelm that are difficult to cope with Objective Andrea Colon would like to develop coping strategies and reduce the frequency of meltdowns  Target Date: 2023-03-27  Frequency: Weekly  Progress: 0 Modality: individual  Related Interventions Andrea Colon will be provided opportunities to process experiences in sessions Therapist will provide referrals for additional resources as appropriate  Therapist will help Andrea Colon to identify and disengage from maladaptive thoughts and behaviors using CBT-based strategies Therapist will provide emotion regulation strategies including mindfulness, relaxation, and self care techniques Diagnosis Axis none 300.02 (Generalized anxiety disorder) - Open - [Signifier: n/a]    Axis none 314.01 (Attention deficit disorder with hyperactivity) - Open - [Signifier: n/a]    Conditions For Discharge Achievement of treatment goals and objectives          [name], PhD               [name], PhD

## 2022-07-05 ENCOUNTER — Ambulatory Visit (INDEPENDENT_AMBULATORY_CARE_PROVIDER_SITE_OTHER): Payer: 59 | Admitting: Clinical

## 2022-07-05 DIAGNOSIS — F331 Major depressive disorder, recurrent, moderate: Secondary | ICD-10-CM | POA: Diagnosis not present

## 2022-07-05 DIAGNOSIS — F902 Attention-deficit hyperactivity disorder, combined type: Secondary | ICD-10-CM | POA: Diagnosis not present

## 2022-07-05 DIAGNOSIS — F411 Generalized anxiety disorder: Secondary | ICD-10-CM | POA: Diagnosis not present
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
## 2022-07-06 ENCOUNTER — Encounter: Payer: Self-pay | Admitting: Family Medicine

## 2022-07-09 ENCOUNTER — Ambulatory Visit (INDEPENDENT_AMBULATORY_CARE_PROVIDER_SITE_OTHER): Payer: 59 | Admitting: Clinical

## 2022-07-09 DIAGNOSIS — F411 Generalized anxiety disorder: Secondary | ICD-10-CM | POA: Diagnosis not present

## 2022-07-09 DIAGNOSIS — F331 Major depressive disorder, recurrent, moderate: Secondary | ICD-10-CM

## 2022-07-09 DIAGNOSIS — F902 Attention-deficit hyperactivity disorder, combined type: Secondary | ICD-10-CM

## 2022-07-09 NOTE — Progress Notes (Signed)
Time: 8:01am-9:00am CPT Code: 18563J (IN PERSON) Diagnosis Code: F90.2, F41.1, F33.2  Andrea Colon was seen in person for therapy. Session focused on further processing traumatic events from her past at her own pace. Therapist provided validation and support, using psychoeducation to normalize her experiences and responses. She is scheduled to be seen again in one week.  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 She expresses herself well in writing.  Client Treatment Preferences  Andrea Colon prefers in-person sessions, during school hours  Client Statement of Needs  Andrea Colon shared that she is seeking parenting skills, as well as healthier coping skills.  Treatment Level  Weekly  Symptoms  Anxiety : restlessness, difficulty relaxing, racing thoughts, difficulty concentrating, difficulty with creating and adhering to plans and routines (Status: maintained). Depression: tearfulness (Status: maintained).  Problems Addressed  New Description, New Description  Goals 1. Andrea Colon experiences difficulty with executive functioning Objective Andrea Colon will develop and implement organizational strategies Target Date: 2023-03-27 Frequency: Weekly  Progress: 0 Modality: individual  Related Interventions Therapist will help Andrea Colon to consider organizational strategies in her day-to-day life, incorporating manualized treatments such as [name] Therapist will provide opportunities to process parenting experiences in session and incorporate strategies from manualized parent training programs 2. Andrea Colon frequently experiences feelings of overwhelm that are difficult to cope with Objective Andrea Colon would like to develop coping strategies and reduce the frequency of meltdowns  Target Date: 2023-03-27 Frequency: Weekly  Progress: 0 Modality: individual  Related Interventions Andrea Colon will be provided opportunities to process  experiences in sessions Therapist will provide referrals for additional resources as appropriate  Therapist will help Andrea Colon to identify and disengage from maladaptive thoughts and behaviors using CBT-based strategies Therapist will provide emotion regulation strategies including mindfulness, relaxation, and self care techniques Diagnosis Axis none 300.02 (Generalized anxiety disorder) - Open - [Signifier: n/a]    Axis none 314.01 (Attention deficit disorder with hyperactivity) - Open - [Signifier: n/a]    Conditions For Discharge Achievement of treatment goals and objectives           Andrea Cruise, PhD               Andrea Cruise, PhD

## 2022-07-10 ENCOUNTER — Encounter: Payer: Self-pay | Admitting: Family Medicine

## 2022-07-11 ENCOUNTER — Ambulatory Visit: Payer: 59 | Admitting: Clinical

## 2022-07-11 ENCOUNTER — Encounter: Payer: Self-pay | Admitting: Family

## 2022-07-11 ENCOUNTER — Ambulatory Visit (INDEPENDENT_AMBULATORY_CARE_PROVIDER_SITE_OTHER): Payer: 59 | Admitting: Family

## 2022-07-11 VITALS — BP 114/72 | HR 80 | Temp 97.9°F | Resp 16 | Ht 64.0 in | Wt 217.1 lb

## 2022-07-11 DIAGNOSIS — N3000 Acute cystitis without hematuria: Secondary | ICD-10-CM | POA: Insufficient documentation

## 2022-07-11 DIAGNOSIS — R350 Frequency of micturition: Secondary | ICD-10-CM | POA: Insufficient documentation

## 2022-07-11 LAB — BASIC METABOLIC PANEL
BUN: 13 mg/dL (ref 6–23)
CO2: 28 mEq/L (ref 19–32)
Calcium: 9.1 mg/dL (ref 8.4–10.5)
Chloride: 100 mEq/L (ref 96–112)
Creatinine, Ser: 0.68 mg/dL (ref 0.40–1.20)
GFR: 104.19 mL/min (ref >60.00)
Glucose, Bld: 87 mg/dL (ref 70–99)
Potassium: 3.8 mEq/L (ref 3.5–5.1)
Sodium: 137 mEq/L (ref 135–145)

## 2022-07-11 LAB — POC URINALSYSI DIPSTICK (AUTOMATED)
Bilirubin, UA: NEGATIVE
Blood, UA: NEGATIVE
Glucose, UA: NEGATIVE
Ketones, UA: NEGATIVE
Nitrite, UA: NEGATIVE
Protein, UA: NEGATIVE
Spec Grav, UA: 1.01 (ref 1.010–1.025)
Urobilinogen, UA: 0.2 E.U./dL
pH, UA: 8 (ref 5.0–8.0)

## 2022-07-11 MED ORDER — NITROFURANTOIN MONOHYD MACRO 100 MG PO CAPS: 100.0000 mg | ORAL_CAPSULE | Freq: Two times a day (BID) | ORAL | 0 refills | Status: AC

## 2022-07-11 NOTE — Assessment & Plan Note (Signed)
poct urine dip in office, +bacteria Urine culture ordered pending results

## 2022-07-11 NOTE — Progress Notes (Signed)
Established Patient Office Visit  Subjective:  Patient ID: Andrea Colon, female    DOB: 1976-04-23  Age: 46 y.o. MRN: [id number]  CC:  Chief Complaint  Patient presents with   Urinary Tract Infection    X 1 week    HPI Andrea Colon is here today with concerns.   Started about one week ago with dysuria and urgency. She feels dehydrated despite water intake, and she feels like she is retaining water. No real swelling in feet, did have it a few days ago but restarted the spironolactone back at 50 mg once daily.   Does have a headache, constant, dull and aching. Feels it behind her eyes. No pnd.  No ear fullness.   She takes spirolactone for her acne.she is worried because se takes vitamin D3, and right after taking is when she had that side effect of headache and feeling very fatigued. She is no longer taking this, headache has improved since she stopped taking the medication.   Denies vaginal discharge. No vaginal itching.   Past Medical History:  Diagnosis Date   Acne    ADD (attention deficit disorder)    Anemia    H/O   Anxiety    B12 deficiency    Back pain    Bilateral swelling of feet and ankles    Chronic SI joint pain    Constipation    Depression    Edema of both lower extremities    Family history of adverse reaction to anesthesia    Gallbladder problem    GERD (gastroesophageal reflux disease)    High triglycerides    Hyperlipidemia    Hypertension    Insomnia    Joint pain    Neuromuscular disorder (HCC)    SI Joint inflamation   Other fatigue    Palpitations    PCOS (polycystic ovarian syndrome)    Pre-diabetes    Shortness of breath on exertion    SI (sacroiliac) joint dysfunction    Vitamin D deficiency     Past Surgical History:  Procedure Laterality Date   APPENDECTOMY     2005    CESAREAN SECTION  11/17/12   CHOLECYSTECTOMY  03/31/14   DILATION AND CURETTAGE OF UTERUS  2010, 07/2010   Polyps   EYE SURGERY     Prism inserted  in left eye   HYSTEROSALPINGOGRAM  07/23/2010   [facility] ([facility])   Paragard insertion  08/25/2015   [facility]  Georgianne Fick)   TONSILLECTOMY N/A 04/09/2017   Procedure: TONSILLECTOMY;  Surgeon: Clyde Canterbury, MD;  Location: [facility] ORS;  Service: ENT;  Laterality: N/A;    Family History  Problem Relation Age of Onset   Anxiety disorder Mother    Hypertension Mother    Cancer Mother 77       Non-sm cell neuro endocrine tumors   Alcoholism Father    Anxiety disorder Father    Aneurysm Father    Breast cancer Paternal Aunt 82   Anxiety disorder Other    Alcoholism Other     Social History   Socioeconomic History   Marital status: Married    Spouse name: Elta Guadel[place]   Number of children: 1   Years of education: Not on file   Highest education level: Not on file  Occupational History   Occupation: Product/process development scientist: [employer]  Tobacco Use   Smoking status: Former    Packs/day: 1.00    Years: 10.00  Total pack years: 10.00    Types: Cigarettes    Quit date: 02/08/2015    Years since quitting: 7.4   Smokeless tobacco: Never  Vaping Use   Vaping Use: Never used  Substance and Sexual Activity   Alcohol use: No    Alcohol/week: 0.0 standard drinks of alcohol   Drug use: No   Sexual activity: Yes    Partners: Male    Birth control/protection: I.U.D.  Other Topics Concern   Not on file  Social History Narrative   Not on file   Social Determinants of Health   Financial Resource Strain: Not on file  Food Insecurity: Not on file  Transportation Needs: Not on file  Physical Activity: Not on file  Stress: Not on file  Social Connections: Not on file  Intimate Partner Violence: Not on file    Outpatient Medications Prior to Visit  Medication Sig Dispense Refill   amphetamine-dextroamphetamine (ADDERALL) 10 MG tablet Take 10 mg by mouth 2 (two) times daily.     azelastine (ASTELIN) 0.1 % nasal spray Place 1 spray into both nostrils 2 (two) times  daily. Use in each nostril as directed 30 mL 0   clonazePAM (KLONOPIN) 0.5 MG tablet Take 1 tablet (0.5 mg total) by mouth at bedtime. 30 tablet 0   escitalopram (LEXAPRO) 10 MG tablet Take 1 tablet (10 mg total) by mouth daily. 90 tablet 0   lisdexamfetamine (VYVANSE) 40 MG capsule Take 1 capsule (40 mg total) by mouth daily before breakfast. 30 capsule 0   PARAGARD INTRAUTERINE COPPER IU 1 Device by Intrauterine route once.      spironolactone (ALDACTONE) 50 MG tablet Take 50 mg by mouth daily.     tretinoin (RETIN-A) 0.025 % cream as needed.     Vitamin D, Ergocalciferol, (DRISDOL) 1.25 MG (50000 UNIT) CAPS capsule Take 1 capsule (50,000 Units total) by mouth every 7 (seven) days. 12 capsule 0   zaleplon (SONATA) 5 MG capsule Take 1 capsule (5 mg total) by mouth at bedtime. 30 capsule 0   No facility-administered medications prior to visit.    Allergies  Allergen Reactions   Sulfa Antibiotics Hives        Objective:    Physical Exam Constitutional:      General: She is not in acute distress.    Appearance: Normal appearance. She is obese. She is not ill-appearing.  HENT:     Right Ear: Tympanic membrane normal.     Left Ear: Tympanic membrane normal.     Nose: Nose normal. No congestion or rhinorrhea.     Right Turbinates: Not enlarged or swollen.     Left Turbinates: Not enlarged or swollen.     Right Sinus: No maxillary sinus tenderness or frontal sinus tenderness.     Left Sinus: No maxillary sinus tenderness or frontal sinus tenderness.     Mouth/Throat:     Mouth: Mucous membranes are moist.     Pharynx: No pharyngeal swelling, oropharyngeal exudate or posterior oropharyngeal erythema.     Tonsils: No tonsillar exudate.  Eyes:     Extraocular Movements: Extraocular movements intact.     Conjunctiva/sclera: Conjunctivae normal.     Pupils: Pupils are equal, round, and reactive to light.  Neck:     Thyroid: No thyroid mass.  Cardiovascular:     Rate and Rhythm:  Normal rate and regular rhythm.  Pulmonary:     Effort: Pulmonary effort is normal.     Breath sounds: Normal breath  sounds.  Abdominal:     General: Abdomen is flat.     Tenderness: There is no abdominal tenderness. There is no left CVA tenderness.  Lymphadenopathy:     Cervical:     Right cervical: No superficial cervical adenopathy.    Left cervical: No superficial cervical adenopathy.  Neurological:     General: No focal deficit present.     Mental Status: She is alert and oriented to person, place, and time.  Psychiatric:        Mood and Affect: Mood normal.        Behavior: Behavior normal.        Thought Content: Thought content normal.        Judgment: Judgment normal.     BP 114/72   Pulse 80   Temp 97.9 F (36.6 C)   Resp 16   Ht '5\' 4"'$  (1.626 m)   Wt 217 lb 2 oz (98.5 kg)   LMP 06/19/2022 (Exact Date)   SpO2 98%   BMI 37.27 kg/m  Wt Readings from Last 3 Encounters:  07/11/22 217 lb 2 oz (98.5 kg)  06/18/22 215 lb 8 oz (97.8 kg)  03/21/22 234 lb (106.1 kg)     Health Maintenance Due  Topic Date Due   Hepatitis C Screening  Never done   COVID-19 Vaccine (3 - Pfizer risk series) 05/23/2020   COLONOSCOPY (Pts 45-41yr Insurance coverage will need to be confirmed)  Never done   MAMMOGRAM  05/11/2022   TETANUS/TDAP  06/12/2022    There are no preventive care reminders to display for this patient.  Lab Results  Component Value Date   TSH 3.18 01/11/2022   Lab Results  Component Value Date   WBC 7.4 06/11/2022   HGB 13.2 06/11/2022   HCT 40.2 06/11/2022   MCV 85.5 06/11/2022   PLT 325.0 06/11/2022   Lab Results  Component Value Date   NA 136 06/11/2022   K 4.0 06/11/2022   CO2 29 06/11/2022   GLUCOSE 94 06/11/2022   BUN 12 06/11/2022   CREATININE 0.84 06/11/2022   BILITOT 0.4 06/11/2022   ALKPHOS 44 06/11/2022   AST 12 06/11/2022   ALT 11 06/11/2022   PROT 7.1 06/11/2022   ALBUMIN 4.2 06/11/2022   CALCIUM 9.4 06/11/2022   ANIONGAP 8  11/19/2012   GFR 83.18 06/11/2022   Lab Results  Component Value Date   HGBA1C 5.3 06/11/2022      Assessment & Plan:   Problem List Items Addressed This Visit       Genitourinary   Acute cystitis without hematuria    rx macrobid 100 mg po bid x 7 days antbx sent to pharmacy, pt to take as directed. Encouraged increased water intake throughout the day. Urine culture/reflex pending results. Choosing to treat due to being symptomatic. If no improvement in the next 2 days pt advised to let me know.  Ordering bmp for kidney function and electrolytes. Pending results.       Relevant Medications   nitrofurantoin, macrocrystal-monohydrate, (MACROBID) 100 MG capsule   Other Relevant Orders   Basic metabolic panel     Other   Urinary frequency - Primary    poct urine dip in office, +bacteria Urine culture ordered pending results       Relevant Orders   POCT Urinalysis Dipstick (Automated) (Completed)   Urine Culture   Basic metabolic panel    Meds ordered this encounter  Medications   nitrofurantoin, macrocrystal-monohydrate, (MACROBID) 100 MG  capsule    Sig: Take 1 capsule (100 mg total) by mouth 2 (two) times daily for 7 days.    Dispense:  14 capsule    Refill:  0    Order Specific Question:   Supervising Provider    Answer:   Diona Browner, AMY E [[id number]]    Follow-up: Return in about 2 weeks (around 07/25/2022) for with Dr. Diona Browner in a few weeks .    Eugenia Pancoast, FNP

## 2022-07-11 NOTE — Assessment & Plan Note (Addendum)
rx macrobid 100 mg po bid x 7 days antbx sent to pharmacy, pt to take as directed. Encouraged increased water intake throughout the day. Urine culture/reflex pending results. Choosing to treat due to being symptomatic. If no improvement in the next 2 days pt advised to let me know.  Ordering bmp for kidney function and electrolytes. Pending results.

## 2022-07-11 NOTE — Patient Instructions (Addendum)
It was a pleasure seeing you today.   You were found to have a urinary tract infection, you have been prescribed an antibiotic to your preferred pharmacy. Please start antibiotic today as directed.   We are sending your urine for a culture to make sure you do not have a resistant bacteria. We will call you if we need to change your medications.   Please make sure you are drinking plenty of fluids over the next few days.  If your symptoms do not improve over the next 5-7 days, or if they worsen, please let us know. Please also let us know if you have worsening back pain, fevers, chills, or body aches.   Recommend vitamin D 2000 IU once daily.  Recommend lower dose of vitamin b12, probably like 500 mcg once daily.   Stop by the lab prior to leaving today. I will notify you of your results once received.   Due to recent changes in healthcare laws, you may see results of your imaging and/or laboratory studies on MyChart before I have had a chance to review them.  I understand that in some cases there may be results that are confusing or concerning to you. Please understand that not all results are received at the same time and often I may need to interpret multiple results in order to provide you with the best plan of care or course of treatment. Therefore, I ask that you please give me 2 business days to thoroughly review all your results before contacting my office for clarification. Should we see a critical lab result, you will be contacted sooner.   It was a pleasure seeing you today! Please do not hesitate to reach out with any questions and or concerns.  Regards,   Eugenia Pancoast FNP-C

## 2022-07-12 ENCOUNTER — Encounter: Payer: Self-pay | Admitting: Family

## 2022-07-12 LAB — URINE CULTURE
MICRO NUMBER:: 13673134
SPECIMEN QUALITY:: ADEQUATE

## 2022-07-15 ENCOUNTER — Other Ambulatory Visit: Payer: Self-pay | Admitting: Family Medicine

## 2022-07-15 ENCOUNTER — Encounter: Payer: Self-pay | Admitting: Family

## 2022-07-15 MED ORDER — ZALEPLON 5 MG PO CAPS
5.0000 mg | ORAL_CAPSULE | Freq: Every day | ORAL | 0 refills | Status: DC
Start: 2022-07-15 — End: 2022-08-13

## 2022-07-15 MED ORDER — CLONAZEPAM 0.5 MG PO TABS
0.5000 mg | ORAL_TABLET | Freq: Every day | ORAL | 0 refills | Status: DC
Start: 2022-07-15 — End: 2022-08-13

## 2022-07-15 NOTE — Telephone Encounter (Signed)
Last office visit 07/11/22 with Dugal for urinary frequency.  Last refilled clonazepam 06/17/22 for #30 with no refills.  Sonata 06/17/22 for #30 with no refills.  Next Appt: 07/25/2022.

## 2022-07-16 ENCOUNTER — Ambulatory Visit
Admission: RE | Admit: 2022-07-16 | Discharge: 2022-07-16 | Disposition: A | Discharge status: Home / Self Care | Payer: 59 | Source: Ambulatory Visit | Attending: Family Medicine | Admitting: Family Medicine

## 2022-07-16 DIAGNOSIS — Z1231 Encounter for screening mammogram for malignant neoplasm of breast: Secondary | ICD-10-CM | POA: Diagnosis present

## 2022-07-17 ENCOUNTER — Ambulatory Visit (INDEPENDENT_AMBULATORY_CARE_PROVIDER_SITE_OTHER): Payer: 59 | Admitting: Clinical

## 2022-07-17 DIAGNOSIS — F331 Major depressive disorder, recurrent, moderate: Secondary | ICD-10-CM | POA: Diagnosis not present

## 2022-07-17 DIAGNOSIS — F902 Attention-deficit hyperactivity disorder, combined type: Secondary | ICD-10-CM | POA: Diagnosis not present

## 2022-07-17 DIAGNOSIS — F411 Generalized anxiety disorder: Secondary | ICD-10-CM | POA: Diagnosis not present
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
## 2022-07-18 ENCOUNTER — Ambulatory Visit: Payer: 59 | Admitting: Clinical

## 2022-07-19 ENCOUNTER — Encounter: Payer: Self-pay | Admitting: Family Medicine

## 2022-07-21 DIAGNOSIS — N938 Other specified abnormal uterine and vaginal bleeding: Secondary | ICD-10-CM | POA: Insufficient documentation

## 2022-07-21 HISTORY — DX: Other specified abnormal uterine and vaginal bleeding: N93.8

## 2022-07-21 NOTE — Assessment & Plan Note (Signed)
Improved control with weight loss and lifestyle changes.

## 2022-07-21 NOTE — Assessment & Plan Note (Signed)
Chronic, Good control on lexapro 10 mg daily, vyvanse 40 mg daily/ adderall 10 mg BID prn.  Clonazepam 0.5 mg prn, using sonata 5 mg prn insomnia

## 2022-07-21 NOTE — Assessment & Plan Note (Addendum)
Can use ibuprofen 600 mg three times daily as needed for menses cramps. Follow up if menses continue to be heavier and painful for vaginal exam and [place] referral.  Hx of uterine fibroids.. hx of D and C.   Has copper  IUD in place since 2016

## 2022-07-21 NOTE — Assessment & Plan Note (Signed)
Encouraged exercise, weight loss, healthy eating habits. ? ?

## 2022-07-21 NOTE — Assessment & Plan Note (Signed)
Stable, chronic.  Continue current medication.    Spironolactone 50 mg p.o. daily

## 2022-07-25 ENCOUNTER — Ambulatory Visit (INDEPENDENT_AMBULATORY_CARE_PROVIDER_SITE_OTHER): Payer: 59 | Admitting: Family Medicine

## 2022-07-25 ENCOUNTER — Encounter: Payer: Self-pay | Admitting: Family Medicine

## 2022-07-25 ENCOUNTER — Ambulatory Visit (INDEPENDENT_AMBULATORY_CARE_PROVIDER_SITE_OTHER): Payer: 59 | Admitting: Clinical

## 2022-07-25 VITALS — BP 110/80 | HR 93 | Temp 98.7°F | Ht 64.0 in | Wt 210.1 lb

## 2022-07-25 DIAGNOSIS — Z30432 Encounter for removal of intrauterine contraceptive device: Secondary | ICD-10-CM | POA: Diagnosis not present

## 2022-07-25 DIAGNOSIS — R350 Frequency of micturition: Secondary | ICD-10-CM | POA: Diagnosis not present

## 2022-07-25 DIAGNOSIS — Z86018 Personal history of other benign neoplasm: Secondary | ICD-10-CM | POA: Diagnosis not present

## 2022-07-25 DIAGNOSIS — F331 Major depressive disorder, recurrent, moderate: Secondary | ICD-10-CM

## 2022-07-25 DIAGNOSIS — F411 Generalized anxiety disorder: Secondary | ICD-10-CM

## 2022-07-25 DIAGNOSIS — F902 Attention-deficit hyperactivity disorder, combined type: Secondary | ICD-10-CM

## 2022-07-25 DIAGNOSIS — N898 Other specified noninflammatory disorders of vagina: Secondary | ICD-10-CM

## 2022-07-25 DIAGNOSIS — N938 Other specified abnormal uterine and vaginal bleeding: Secondary | ICD-10-CM

## 2022-07-25 NOTE — Progress Notes (Signed)
Time: 9:01am-10:00am CPT Code: 49449Q (IN PERSON) Diagnosis Code: F90.2, F41.1, F33.2  Andrea Colon was seen in person for therapy. Session focused on preparing for her upcoming pelvic exam and appointment with her weight management physician. Therapist worked with her to consider healthy boundaries, as well as coping strategies for after the appointment. She is scheduled to be seen again on Tuesday.  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 She expresses herself well in writing.  Client Treatment Preferences  Andrea Colon prefers in-person sessions, during school hours  Client Statement of Needs  Andrea Colon shared that she is seeking parenting skills, as well as healthier coping skills.  Treatment Level  Weekly  Symptoms  Anxiety : restlessness, difficulty relaxing, racing thoughts, difficulty concentrating, difficulty with creating and adhering to plans and routines (Status: maintained). Depression: tearfulness (Status: maintained).  Problems Addressed  New Description, New Description  Goals 1. Andrea Colon experiences difficulty with executive functioning Objective Andrea Colon will develop and implement organizational strategies Target Date: 2023-03-27 Frequency: Weekly  Progress: 0 Modality: individual  Related Interventions Therapist will help Zalia to consider organizational strategies in her day-to-day life, incorporating manualized treatments such as [name] Therapist will provide opportunities to process parenting experiences in session and incorporate strategies from manualized parent training programs 2. Andrea Colon frequently experiences feelings of overwhelm that are difficult to cope with Objective Andrea Colon would like to develop coping strategies and reduce the frequency of meltdowns  Target Date: 2023-03-27 Frequency: Weekly  Progress: 0 Modality: individual  Related Interventions Andrea Colon will be  provided opportunities to process experiences in sessions Therapist will provide referrals for additional resources as appropriate  Therapist will help Hadiyah to identify and disengage from maladaptive thoughts and behaviors using CBT-based strategies Therapist will provide emotion regulation strategies including mindfulness, relaxation, and self care techniques Diagnosis Axis none 300.02 (Generalized anxiety disorder) - Open - [Signifier: n/a]    Axis none 314.01 (Attention deficit disorder with hyperactivity) - Open - [Signifier: n/a]    Conditions For Discharge Achievement of treatment goals and objectives            [name], PhD               [name], PhD

## 2022-07-25 NOTE — Progress Notes (Signed)
Patient ID: Andrea Colon, female    DOB: 22-May-1976, 46 y.o.   MRN: [id number]  This visit was conducted in person.  BP 110/80   Pulse 93   Temp 98.7 F (37.1 C) (Oral)   Ht '5\' 4"'$  (1.626 m)   Wt 210 lb 2 oz (95.3 kg)   LMP 07/17/2022 (Exact Date)   SpO2 96%   BMI 36.07 kg/m    CC:  Chief Complaint  Patient presents with   Follow-up    From [place] visit on 07/11/2022-UTI    Subjective:   HPI: Andrea Colon is a 46 y.o. female presenting on 07/25/2022 for Follow-up (From [place] visit on 07/11/2022-[facility]I)  Seen on 7/20 Dx with UTI treated with macrobid 100 Mg BID x 7 days  Culture returned mixed urogenital flora.  She has had improvement in urinary frequency.. has improved now with dehydration.  She has noted after lemon based ETOH drinks at concert.   She has also continued having issues with DUB. No improvement with ibuprofen 600 mg TID.  Hx of uterine fibroids and polyps, Hx of D/C  Has copper IUD in place since 2016  She reports menses  lasting longer, heavier, ongoing x 8-13 days long. Seems to occur longer every other months... ongoing x 1 year  Using tampons/pads every 2 hours on heavier days.   CBC normal in 6.2023  TSH nml in 12/2021.   She is back on spirolactone with    Not due for pap.. she would like Copper IUD replaced.    Relevant past medical, surgical, family and social history reviewed and updated as indicated. Interim medical history since our last visit reviewed. Allergies and medications reviewed and updated. Outpatient Medications Prior to Visit  Medication Sig Dispense Refill   amphetamine-dextroamphetamine (ADDERALL) 10 MG tablet Take 10 mg by mouth 2 (two) times daily.     azelastine (ASTELIN) 0.1 % nasal spray Place 1 spray into both nostrils 2 (two) times daily. Use in each nostril as directed 30 mL 0   clonazePAM (KLONOPIN) 0.5 MG tablet Take 1 tablet (0.5 mg total) by mouth at bedtime. 30 tablet 0   escitalopram (LEXAPRO) 5 MG tablet  Take 7.5 mg by mouth daily.     lisdexamfetamine (VYVANSE) 40 MG capsule Take 1 capsule (40 mg total) by mouth daily before breakfast. 30 capsule 0   PARAGARD INTRAUTERINE COPPER IU 1 Device by Intrauterine route once.      spironolactone (ALDACTONE) 50 MG tablet Take 50 mg by mouth daily.     tretinoin (RETIN-A) 0.025 % cream as needed.     Vitamin D, Ergocalciferol, (DRISDOL) 1.25 MG (50000 UNIT) CAPS capsule Take 1 capsule (50,000 Units total) by mouth every 7 (seven) days. 12 capsule 0   zaleplon (SONATA) 5 MG capsule Take 1 capsule (5 mg total) by mouth at bedtime. 30 capsule 0   escitalopram (LEXAPRO) 10 MG tablet Take 1 tablet (10 mg total) by mouth daily. 90 tablet 0   No facility-administered medications prior to visit.     Per HPI unless specifically indicated in ROS section below Review of Systems  Constitutional:  Negative for fatigue and fever.  HENT:  Negative for congestion.   Eyes:  Negative for pain.  Respiratory:  Negative for cough and shortness of breath.   Cardiovascular:  Negative for chest pain, palpitations and leg swelling.  Gastrointestinal:  Negative for abdominal pain.  Genitourinary:  Negative for dysuria and vaginal bleeding.  Musculoskeletal:  Negative for back pain.  Neurological:  Negative for syncope, light-headedness and headaches.  Psychiatric/Behavioral:  Negative for dysphoric mood.    Objective:  BP 110/80   Pulse 93   Temp 98.7 F (37.1 C) (Oral)   Ht '5\' 4"'$  (1.626 m)   Wt 210 lb 2 oz (95.3 kg)   LMP 07/17/2022 (Exact Date)   SpO2 96%   BMI 36.07 kg/m   Wt Readings from Last 3 Encounters:  07/25/22 210 lb 2 oz (95.3 kg)  07/11/22 217 lb 2 oz (98.5 kg)  06/18/22 215 lb 8 oz (97.8 kg)      Physical Exam Exam conducted with a chaperone present.  Constitutional:      General: She is not in acute distress.    Appearance: Normal appearance. She is well-developed. She is not ill-appearing or toxic-appearing.  HENT:     Head:  Normocephalic.     Right Ear: Hearing, tympanic membrane, ear canal and external ear normal. Tympanic membrane is not erythematous, retracted or bulging.     Left Ear: Hearing, tympanic membrane, ear canal and external ear normal. Tympanic membrane is not erythematous, retracted or bulging.     Nose: No mucosal edema or rhinorrhea.     Right Sinus: No maxillary sinus tenderness or frontal sinus tenderness.     Left Sinus: No maxillary sinus tenderness or frontal sinus tenderness.     Mouth/Throat:     Pharynx: Uvula midline.  Eyes:     General: Lids are normal. Lids are everted, no foreign bodies appreciated.     Conjunctiva/sclera: Conjunctivae normal.     Pupils: Pupils are equal, round, and reactive to light.  Neck:     Thyroid: No thyroid mass or thyromegaly.     Vascular: No carotid bruit.     Trachea: Trachea normal.  Cardiovascular:     Rate and Rhythm: Normal rate and regular rhythm.     Pulses: Normal pulses.     Heart sounds: Normal heart sounds, S1 normal and S2 normal. No murmur heard.    No friction rub. No gallop.  Pulmonary:     Effort: Pulmonary effort is normal. No tachypnea or respiratory distress.     Breath sounds: Normal breath sounds. No decreased breath sounds, wheezing, rhonchi or rales.  Abdominal:     General: Bowel sounds are normal.     Palpations: Abdomen is soft.     Tenderness: There is no abdominal tenderness.  Genitourinary:    Exam position: Lithotomy position.     Labia:        Right: No rash or tenderness.        Left: No rash or tenderness.      Vagina: Normal.     Cervix: Normal.     Uterus: Normal.      Adnexa: Right adnexa normal and left adnexa normal.     Comments:  Patient gave verbal consent for removal of IUD.  Speculum used eyes cervix, IUD string visualized easily.  Removed IUD without bleeding or complications. Musculoskeletal:     Cervical back: Normal range of motion and neck supple.  Skin:    General: Skin is warm and dry.      Findings: No rash.  Neurological:     Mental Status: She is alert.  Psychiatric:        Mood and Affect: Mood is not anxious or depressed.        Speech: Speech normal.  Behavior: Behavior normal. Behavior is cooperative.        Thought Content: Thought content normal.        Judgment: Judgment normal.       Results for orders placed or performed in visit on 07/11/22  Urine Culture   Specimen: Urine  Result Value Ref Range   MICRO NUMBER: [id number]    SPECIMEN QUALITY: Adequate    Sample Source URINE    STATUS: FINAL    Result:      Mixed genital flora isolated. These superficial bacteria are not indicative of a urinary tract infection. No further organism identification is warranted on this specimen. If clinically indicated, recollect clean-catch, mid-stream urine and transfer  immediately to Urine Culture Transport Tube.   Basic metabolic panel  Result Value Ref Range   Sodium 137 135 - 145 mEq/L   Potassium 3.8 3.5 - 5.1 mEq/L   Chloride 100 96 - 112 mEq/L   CO2 28 19 - 32 mEq/L   Glucose, Bld 87 70 - 99 mg/dL   BUN 13 6 - 23 mg/dL   Creatinine, Ser 0.68 0.40 - 1.20 mg/dL   GFR 104.19 >60.00 mL/min   Calcium 9.1 8.4 - 10.5 mg/dL  POCT Urinalysis Dipstick (Automated)  Result Value Ref Range   Color, UA Yellow    Clarity, UA clear    Glucose, UA Negative Negative   Bilirubin, UA Negative    Ketones, UA Negative    Spec Grav, UA 1.010 1.010 - 1.025   Blood, UA Negative    pH, UA 8.0 5.0 - 8.0   Protein, UA Negative Negative   Urobilinogen, UA 0.2 0.2 or 1.0 E.U./dL   Nitrite, UA Negative    Leukocytes, UA Trace (A) Negative     COVID 19 screen:  No recent travel or known exposure to COVID19 The patient denies respiratory symptoms of COVID 19 at this time. The importance of social distancing was discussed today.   Assessment and Plan Problem List Items Addressed This Visit     DUB (dysfunctional uterine bleeding)    Chronic, no improvement with  ibuprofen.  She has a history of uterine polyps and fibroids but nothing was palpated on exam.  She will move forward with transvaginal ultrasound.  Copper IUD was removed as requested given patient concerns of associated symptoms.      Relevant Orders   US PELVIC COMPLETE WITH TRANSVAGINAL (Completed)   Urinary frequency    Acute, now resolved with increased water intake.  Urine culture was negative.  She feels symptoms were secondary to bladder irritation from lemon based alcoholic drinks which she has since stopped.      Vaginal irritation    Acute, Will proceed with send out testing for bacterial and candidal vaginitis.      Relevant Orders   WET PREP BY MOLECULAR PROBE (Completed)   Other Visit Diagnoses     Encounter for IUD removal    -  Primary   History of uterine fibroid       Relevant Orders   US PELVIC COMPLETE WITH TRANSVAGINAL (Completed)      Orders Placed This Encounter  Procedures   WET PREP BY MOLECULAR PROBE   US PELVIC COMPLETE WITH TRANSVAGINAL    Standing Status:   Future    Number of Occurrences:   1    Standing Expiration Date:   07/26/2023    Order Specific Question:   Reason for Exam (SYMPTOM  OR DIAGNOSIS REQUIRED)  Answer:   DUB, history of foibroids    Order Specific Question:   Preferred imaging location?    Answer:   [facility]     Eliezer Lofts, MD

## 2022-07-25 NOTE — Patient Instructions (Signed)
We will set up transvaginal US. We will call with results of test for yeast etc.  If US unremarkable we can try a low estrogen/progesterone dose birth control med

## 2022-07-26 ENCOUNTER — Encounter: Payer: Self-pay | Admitting: Family Medicine

## 2022-07-26 LAB — WET PREP BY MOLECULAR PROBE
Candida species: NOT DETECTED
Gardnerella vaginalis: NOT DETECTED
MICRO NUMBER:: 13732080
SPECIMEN QUALITY:: ADEQUATE
Trichomonas vaginosis: NOT DETECTED

## 2022-07-30 ENCOUNTER — Ambulatory Visit (INDEPENDENT_AMBULATORY_CARE_PROVIDER_SITE_OTHER): Payer: 59 | Admitting: Clinical

## 2022-07-30 DIAGNOSIS — F411 Generalized anxiety disorder: Secondary | ICD-10-CM | POA: Diagnosis not present

## 2022-07-30 DIAGNOSIS — F902 Attention-deficit hyperactivity disorder, combined type: Secondary | ICD-10-CM

## 2022-07-30 DIAGNOSIS — F331 Major depressive disorder, recurrent, moderate: Secondary | ICD-10-CM | POA: Diagnosis not present
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
## 2022-07-31 ENCOUNTER — Encounter (INDEPENDENT_AMBULATORY_CARE_PROVIDER_SITE_OTHER): Payer: Self-pay

## 2022-08-01 ENCOUNTER — Ambulatory Visit
Admission: RE | Admit: 2022-08-01 | Discharge: 2022-08-01 | Disposition: A | Discharge status: Home / Self Care | Payer: 59 | Source: Ambulatory Visit | Attending: Family Medicine | Admitting: Family Medicine

## 2022-08-01 DIAGNOSIS — N938 Other specified abnormal uterine and vaginal bleeding: Secondary | ICD-10-CM | POA: Diagnosis not present

## 2022-08-01 DIAGNOSIS — Z86018 Personal history of other benign neoplasm: Secondary | ICD-10-CM | POA: Diagnosis present

## 2022-08-02 ENCOUNTER — Encounter: Payer: Self-pay | Admitting: Family Medicine

## 2022-08-10 DIAGNOSIS — N898 Other specified noninflammatory disorders of vagina: Secondary | ICD-10-CM | POA: Insufficient documentation

## 2022-08-10 NOTE — Assessment & Plan Note (Signed)
Chronic, no improvement with ibuprofen.  She has a history of uterine polyps and fibroids but nothing was palpated on exam.  She will move forward with transvaginal ultrasound.  Copper IUD was removed as requested given patient concerns of associated symptoms.

## 2022-08-10 NOTE — Assessment & Plan Note (Signed)
Acute, now resolved with increased water intake.  Urine culture was negative.  She feels symptoms were secondary to bladder irritation from lemon based alcoholic drinks which she has since stopped.

## 2022-08-10 NOTE — Assessment & Plan Note (Addendum)
Acute, Will proceed with send out testing for bacterial and candidal vaginitis.

## 2022-08-13 ENCOUNTER — Other Ambulatory Visit: Payer: Self-pay | Admitting: Family Medicine

## 2022-08-14 MED ORDER — ZALEPLON 5 MG PO CAPS: 5.0000 mg | ORAL_CAPSULE | Freq: Every day | ORAL | 0 refills | Status: DC

## 2022-08-14 MED ORDER — CLONAZEPAM 0.5 MG PO TABS: 0.5000 mg | ORAL_TABLET | Freq: Every day | ORAL | 0 refills | Status: DC

## 2022-08-14 NOTE — Telephone Encounter (Signed)
Refill request Sonata last refill 07/15/22 #30 Clonazepam last refill 07/15/22 #30 Last office visit 07/25/22

## 2022-08-15 ENCOUNTER — Ambulatory Visit (INDEPENDENT_AMBULATORY_CARE_PROVIDER_SITE_OTHER): Payer: 59 | Admitting: Clinical

## 2022-08-15 DIAGNOSIS — F411 Generalized anxiety disorder: Secondary | ICD-10-CM

## 2022-08-15 DIAGNOSIS — F331 Major depressive disorder, recurrent, moderate: Secondary | ICD-10-CM | POA: Diagnosis not present

## 2022-08-15 DIAGNOSIS — F902 Attention-deficit hyperactivity disorder, combined type: Secondary | ICD-10-CM

## 2022-08-15 NOTE — Progress Notes (Signed)
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 She reflected upon her recent vacation, and shared details of a typical conversational exchange with her husband. Therapist processed this with her, pointing out possible dynamics in their relationship. She is scheduled to be seen again in one week.  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 She expresses herself well in writing.  Client Treatment Preferences  Andrea Colon prefers in-person sessions, during school hours  Client Statement of Needs  Andrea Colon shared that she is seeking parenting skills, as well as healthier coping skills.  Treatment Level  Weekly  Symptoms  Anxiety : restlessness, difficulty relaxing, racing thoughts, difficulty concentrating, difficulty with creating and adhering to plans and routines (Status: maintained). Depression: tearfulness (Status: maintained).  Problems Addressed  New Description, New Description  Goals 1. Andrea Colon experiences difficulty with executive functioning Objective Andrea Colon will develop and implement organizational strategies Target Date: 2023-03-27 Frequency: Weekly  Progress: 0 Modality: individual  Related Interventions Therapist will help Nga to consider organizational strategies in her day-to-day life, incorporating manualized treatments such as [name] Therapist will provide opportunities to process parenting experiences in session and incorporate strategies from manualized parent training programs 2. Andrea Colon frequently experiences feelings of overwhelm that are difficult to cope with Objective Andrea Colon would like to develop coping strategies and reduce the frequency of meltdowns  Target Date: 2023-03-27 Frequency: Weekly  Progress: 0 Modality: individual  Related Interventions Andrea Colon will be provided opportunities to process  experiences in sessions Therapist will provide referrals for additional resources as appropriate  Therapist will help Andrea Colon to identify and disengage from maladaptive thoughts and behaviors using CBT-based strategies Therapist will provide emotion regulation strategies including mindfulness, relaxation, and self care techniques Diagnosis Axis none 300.02 (Generalized anxiety disorder) - Open - [Signifier: n/a]    Axis none 314.01 (Attention deficit disorder with hyperactivity) - Open - [Signifier: n/a]    Conditions For Discharge Achievement of treatment goals and objectives                       [name], PhD               [name], PhD

## 2022-08-22 ENCOUNTER — Ambulatory Visit (INDEPENDENT_AMBULATORY_CARE_PROVIDER_SITE_OTHER): Payer: 59 | Admitting: Clinical

## 2022-08-22 DIAGNOSIS — F331 Major depressive disorder, recurrent, moderate: Secondary | ICD-10-CM | POA: Diagnosis not present

## 2022-08-22 DIAGNOSIS — F411 Generalized anxiety disorder: Secondary | ICD-10-CM | POA: Diagnosis not present

## 2022-08-22 DIAGNOSIS — F902 Attention-deficit hyperactivity disorder, combined type: Secondary | ICD-10-CM | POA: Diagnosis not present

## 2022-08-22 NOTE — Progress Notes (Signed)
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 She reflected upon an increase in suicidal ideation, but during risk assessment denied plan or intent, and identified several strong barriers. She attributed this to having been home with her child while trying to work during school closures. Therapist processed her thoughts on herself as a parent, providing validation, support, and psychoeducation. She is scheduled to be seen again in one week.  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 She expresses herself well in writing.  Client Treatment Preferences  Shantae prefers in-person sessions, during school hours  Client Statement of Needs  Tashiana shared that she is seeking parenting skills, as well as healthier coping skills.  Treatment Level  Weekly  Symptoms  Anxiety : restlessness, difficulty relaxing, racing thoughts, difficulty concentrating, difficulty with creating and adhering to plans and routines (Status: maintained). Depression: tearfulness (Status: maintained).  Problems Addressed  New Description, New Description  Goals 1. [name] experiences difficulty with executive functioning Objective Jaylnn will develop and implement organizational strategies Target Date: 2023-03-27 Frequency: Weekly  Progress: 0 Modality: individual  Related Interventions Therapist will help Latima to consider organizational strategies in her day-to-day life, incorporating manualized treatments such as [name] Therapist will provide opportunities to process parenting experiences in session and incorporate strategies from manualized parent training programs 2. Velva frequently experiences feelings of overwhelm that are difficult to cope with Objective Marcell would like to develop coping strategies and reduce the frequency of meltdowns  Target  Date: 2023-03-27 Frequency: Weekly  Progress: 0 Modality: individual  Related Interventions Estel will be provided opportunities to process experiences in sessions Therapist will provide referrals for additional resources as appropriate  Therapist will help Sevana to identify and disengage from maladaptive thoughts and behaviors using CBT-based strategies Therapist will provide emotion regulation strategies including mindfulness, relaxation, and self care techniques Diagnosis Axis none 300.02 (Generalized anxiety disorder) - Open - [Signifier: n/a]    Axis none 314.01 (Attention deficit disorder with hyperactivity) - Open - [Signifier: n/a]    Conditions For Discharge Achievement of treatment goals and objectives                 [name], PhD               [name], PhD

## 2022-08-29 ENCOUNTER — Ambulatory Visit (INDEPENDENT_AMBULATORY_CARE_PROVIDER_SITE_OTHER): Payer: 59 | Admitting: Clinical

## 2022-08-29 ENCOUNTER — Encounter: Payer: Self-pay | Admitting: Obstetrics and Gynecology

## 2022-08-29 ENCOUNTER — Other Ambulatory Visit (HOSPITAL_COMMUNITY)
Admission: RE | Admit: 2022-08-29 | Discharge: 2022-08-29 | Disposition: A | Discharge status: Home / Self Care | Payer: 59 | Source: Ambulatory Visit | Attending: Obstetrics and Gynecology | Admitting: Obstetrics and Gynecology

## 2022-08-29 ENCOUNTER — Ambulatory Visit (INDEPENDENT_AMBULATORY_CARE_PROVIDER_SITE_OTHER): Payer: 59 | Admitting: Obstetrics and Gynecology

## 2022-08-29 VITALS — BP 110/72 | HR 66 | Ht 64.0 in | Wt 207.0 lb

## 2022-08-29 DIAGNOSIS — F411 Generalized anxiety disorder: Secondary | ICD-10-CM

## 2022-08-29 DIAGNOSIS — F331 Major depressive disorder, recurrent, moderate: Secondary | ICD-10-CM | POA: Diagnosis not present

## 2022-08-29 DIAGNOSIS — Z124 Encounter for screening for malignant neoplasm of cervix: Secondary | ICD-10-CM

## 2022-08-29 DIAGNOSIS — N939 Abnormal uterine and vaginal bleeding, unspecified: Secondary | ICD-10-CM | POA: Insufficient documentation

## 2022-08-29 DIAGNOSIS — F902 Attention-deficit hyperactivity disorder, combined type: Secondary | ICD-10-CM | POA: Diagnosis not present

## 2022-08-29 DIAGNOSIS — N943 Premenstrual tension syndrome: Secondary | ICD-10-CM | POA: Diagnosis not present

## 2022-08-29 MED ORDER — NORETHIN ACE-ETH ESTRAD-FE 1-20 MG-MCG PO TABS: 1.0000 | ORAL_TABLET | Freq: Every day | ORAL | 0 refills | Status: DC

## 2022-08-29 NOTE — Progress Notes (Signed)
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 She shared about developments in her dynamics with several of her medical providers, and therapist encouraged her to consider whether she may be looking to these relationships to meet unmet needs, such as with caregivers during her childhood. She reported occasional suicidal ideation without plan or intent, and therapist engaged her in a discussion as to whether she may be experiencing intrusive thoughts. She is scheduled to be seen again in two weeks, and therapist will reach out as cancellations happen.  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 She expresses herself well in writing.  Client Treatment Preferences  Andrea Colon prefers in-person sessions, during school hours  Client Statement of Needs  Andrea Colon shared that she is seeking parenting skills, as well as healthier coping skills.  Treatment Level  Weekly  Symptoms  Anxiety : restlessness, difficulty relaxing, racing thoughts, difficulty concentrating, difficulty with creating and adhering to plans and routines (Status: maintained). Depression: tearfulness (Status: maintained).  Problems Addressed  New Description, New Description  Goals 1. Andrea Colon experiences difficulty with executive functioning Objective Andrea Colon will develop and implement organizational strategies Target Date: 2023-03-27 Frequency: Weekly  Progress: 0 Modality: individual  Related Interventions Therapist will help Andrea Colon to consider organizational strategies in her day-to-day life, incorporating manualized treatments such as [name] Therapist will provide opportunities to process parenting experiences in session and incorporate strategies from manualized parent training programs 2. Andrea Colon frequently experiences feelings of overwhelm that are difficult to cope  with Objective Andrea Colon would like to develop coping strategies and reduce the frequency of meltdowns  Target Date: 2023-03-27 Frequency: Weekly  Progress: 0 Modality: individual  Related Interventions [name] will be provided opportunities to process experiences in sessions Therapist will provide referrals for additional resources as appropriate  Therapist will help [name] to identify and disengage from maladaptive thoughts and behaviors using CBT-based strategies Therapist will provide emotion regulation strategies including mindfulness, relaxation, and self care techniques Diagnosis Axis none 300.02 (Generalized anxiety disorder) - Open - [Signifier: n/a]    Axis none 314.01 (Attention deficit disorder with hyperactivity) - Open - [Signifier: n/a]    Conditions For Discharge Achievement of treatment goals and objectives         [name], PhD               [name], PhD

## 2022-08-29 NOTE — Patient Instructions (Addendum)
Endometrial Biopsy Post-procedure Instructions Cramping is common.  You may take Ibuprofen, Aleve, or Tylenol for the cramping.  This should resolve within 24 hours.   You may have a small amount of spotting.  You should wear a mini pad for the next few days. You may have intercourse in 24 hours. You need to call the office if you have any pelvic pain, fever, heavy bleeding, or foul smelling vaginal discharge. Shower or bathe as normal You will be notified within one week of your biopsy results or we will discuss your results at your follow-up appointment if needed.    Oral Contraception Information Oral contraceptive pills (OCPs) are medicines taken by mouth to prevent pregnancy. They work by: Preventing the ovaries from releasing eggs. Thickening mucus in the lower part of the uterus (cervix). This prevents sperm from entering the uterus. Thinning the lining of the uterus (endometrium). This prevents a fertilized egg from attaching to the endometrium. OCPs are highly effective when taken exactly as prescribed. However, OCPs do not prevent STIs (sexually transmitted infections). Using condoms while on an OCP can help prevent STIs. What happens before starting OCPs? Before you start taking OCPs: You may have a physical exam, blood test, and Pap test. Your health care provider will make sure you are a good candidate for oral contraception. OCPs are not a good option for certain women, such as: Women who smoke and are older than age 15. Women who have or have had certain conditions, such as: A history of high blood pressure. Deep vein thrombosis. Pulmonary embolism. Stroke. Cardiovascular disease. Peripheral vascular disease. Ask your health care provider about the possible side effects of the OCP you may be prescribed. Be aware that it can take 2-3 months for your body to adjust to changes in hormone levels. Types of oral contraception  Birth control pills contain the hormones estrogen  and progestin (synthetic progesterone) or progestin only. The combination pill This type of pill contains estrogen and progestin hormones. Conventional contraception pills come in packs of 21 or 28 pills. Some packs with 28-day pills contain estrogen and progestin for the first 21-24 days. Hormone-free tablets, called placebos, are taken for the final 4-7 days. You should have menstrual bleeding during the time you take the placebos. In packs with 21 tablets, you take no pills for 7 days. Menstrual bleeding occurs during these days. (Some people prefer taking a pill for 28 days to help establish a routine). Extended-interval contraception pills come in packs of 91 pills. The first 84 tablets have both estrogen and progestin. The last 7 pills are placebos. Menstrual bleeding occurs during the placebo days. With this schedule, menstrual bleeding happens once every 3 months. Continuous contraception pills come in packs of 28 pills. All pills in the pack contain estrogen and progestin. With this schedule, regular menstrual bleeding does not happen, but there may be spotting or irregular bleeding. Progestin-only pills This type of pill is often called the mini-pill and contains the progestin hormone only. It comes in packs of 28 pills. In some packs, the last 4 pills are placebos. The pill must be taken at the same time every day. This is very important to prevent pregnancy. Menstrual bleeding may not be regular or predictable. What are the advantages? Oral contraception provides reliable and continuous contraception if taken as directed. It may treat or decrease symptoms of: Menstrual period cramps. Irregular menstrual cycle or bleeding. Heavy menstrual flow. Abnormal uterine bleeding. Acne, depending on the type of pill. Polycystic  ovarian syndrome (POS). Endometriosis. Iron deficiency anemia. Premenstrual symptoms, including severe irritability, depression, or anxiety. It also may: Reduce the  risk of endometrial and ovarian cancer. Be used as emergency contraception. Prevent ectopic pregnancies and infections of the fallopian tubes. What can make OCPs less effective? OCPs may be less effective if: You forget to take the pill every day. For progestin-only pills, it is especially important to take the pill at the same time each day. Even taking it 3 hours late can increase the risk of pregnancy. You have a stomach or intestinal disease that reduces your body's ability to absorb the pill. You take OCPs with other medicines that make OCPs less effective, such as antibiotics, certain HIV medicines, and some seizure medicines. You take expired OCPs. You forget to restart the pill after 7 days of not taking it. This refers to the packs of 21 pills. What are the side effects and risks? OCPs can sometimes cause side effects, such as: Headache. Depression. Trouble sleeping. Nausea and vomiting. Breast tenderness. Irregular bleeding or spotting during the first several months. Bloating or fluid retention. Increase in blood pressure. Combination pills may slightly increase the risk of: Blood clots. Heart attack. Stroke. Follow these instructions at home: Follow instructions from your health care provider about how to start taking your first cycle of OCPs. Depending on when you start the pill, you may need to use a backup form of birth control, such as condoms, during the first week. Make sure you know what steps to take if you forget to take the pill. Summary Oral contraceptive pills (OCPs) are medicines taken by mouth to prevent pregnancy. They are highly effective when taken exactly as prescribed. OCPs contain a combination of the hormones estrogen and progestin (synthetic progesterone) or progestin only. Before you start taking the pill, you may have a physical exam, blood test, and Pap test. Your health care provider will make sure you are a good candidate for oral  contraception. The combination pill may come in a 21-day pack, a 28-day pack, or a 91-day pack. Progestin-only pills come in packs of 28 pills. OCPs can sometimes cause side effects, such as headache, nausea, breast tenderness, or irregular bleeding. This information is not intended to replace advice given to you by your health care provider. Make sure you discuss any questions you have with your health care provider. Document Revised: 09/08/2020 Document Reviewed: 08/17/2020 Elsevier Patient Education  [place].

## 2022-08-29 NOTE — Addendum Note (Signed)
Addended by: Dorothy Spark on: 08/29/2022 04:55 PM   Modules accepted: Orders
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
## 2022-09-02 ENCOUNTER — Encounter: Payer: Self-pay | Admitting: Family Medicine

## 2022-09-03 ENCOUNTER — Other Ambulatory Visit: Payer: Self-pay

## 2022-09-03 DIAGNOSIS — Z1211 Encounter for screening for malignant neoplasm of colon: Secondary | ICD-10-CM

## 2022-09-03 LAB — SURGICAL PATHOLOGY

## 2022-09-03 MED ORDER — NA SULFATE-K SULFATE-MG SULF 17.5-3.13-1.6 GM/177ML PO SOLN: 1.0000 | Freq: Once | ORAL | 0 refills | Status: AC

## 2022-09-03 NOTE — Progress Notes (Signed)
sup

## 2022-09-05 ENCOUNTER — Encounter: Payer: Self-pay | Admitting: Gastroenterology

## 2022-09-05 LAB — CYTOLOGY - PAP
Comment: NEGATIVE
Diagnosis: NEGATIVE
Diagnosis: REACTIVE
High risk HPV: NEGATIVE

## 2022-09-06 ENCOUNTER — Encounter
Admission: RE | Disposition: A | Discharge status: Home / Self Care | Payer: Self-pay | Source: Home / Self Care | Attending: Gastroenterology

## 2022-09-06 ENCOUNTER — Ambulatory Visit: Payer: 59 | Admitting: Anesthesiology

## 2022-09-06 ENCOUNTER — Ambulatory Visit
Admission: RE | Admit: 2022-09-06 | Discharge: 2022-09-06 | Disposition: A | Discharge status: Home / Self Care | Payer: 59 | Attending: Gastroenterology | Admitting: Gastroenterology

## 2022-09-06 DIAGNOSIS — K635 Polyp of colon: Secondary | ICD-10-CM

## 2022-09-06 DIAGNOSIS — D125 Benign neoplasm of sigmoid colon: Secondary | ICD-10-CM | POA: Diagnosis not present

## 2022-09-06 DIAGNOSIS — K644 Residual hemorrhoidal skin tags: Secondary | ICD-10-CM | POA: Diagnosis not present

## 2022-09-06 DIAGNOSIS — Z1211 Encounter for screening for malignant neoplasm of colon: Secondary | ICD-10-CM

## 2022-09-06 HISTORY — PX: COLONOSCOPY WITH PROPOFOL: SHX5780

## 2022-09-06 LAB — POCT PREGNANCY, URINE: Preg Test, Ur: NEGATIVE

## 2022-09-06 SURGERY — COLONOSCOPY WITH PROPOFOL
Anesthesia: General

## 2022-09-06 MED ORDER — PROPOFOL 1000 MG/100ML IV EMUL
INTRAVENOUS | Status: AC
  Filled 2022-09-06: qty 100

## 2022-09-06 MED ORDER — DEXMEDETOMIDINE (PRECEDEX) IN NS 20 MCG/5ML (4 MCG/ML) IV SYRINGE
PREFILLED_SYRINGE | INTRAVENOUS | Status: DC | PRN
  Administered 2022-09-06: 8 ug via INTRAVENOUS

## 2022-09-06 MED ORDER — LIDOCAINE HCL (PF) 2 % IJ SOLN
INTRAMUSCULAR | Status: AC
  Filled 2022-09-06: qty 15

## 2022-09-06 MED ORDER — LIDOCAINE HCL (CARDIAC) PF 100 MG/5ML IV SOSY
PREFILLED_SYRINGE | INTRAVENOUS | Status: DC | PRN
  Administered 2022-09-06: 80 mg via INTRAVENOUS

## 2022-09-06 MED ORDER — GLYCOPYRROLATE 0.2 MG/ML IJ SOLN
INTRAMUSCULAR | Status: AC
  Filled 2022-09-06: qty 1

## 2022-09-06 MED ORDER — PROPOFOL 500 MG/50ML IV EMUL
INTRAVENOUS | Status: DC | PRN
  Administered 2022-09-06: 140 ug/kg/min via INTRAVENOUS

## 2022-09-06 MED ORDER — PROPOFOL 10 MG/ML IV BOLUS
INTRAVENOUS | Status: DC | PRN
  Administered 2022-09-06: 60 mg via INTRAVENOUS

## 2022-09-06 MED ORDER — LIDOCAINE HCL (PF) 2 % IJ SOLN
INTRAMUSCULAR | Status: AC
  Filled 2022-09-06: qty 5

## 2022-09-06 MED ORDER — SODIUM CHLORIDE 0.9 % IV SOLN: INTRAVENOUS | Status: DC

## 2022-09-06 NOTE — Op Note (Signed)
[facility] Gastroenterology Patient Name: Andrea Colon Procedure Date: 09/06/2022 8:13 AM MRN: [id number] Account #: [id number] Date of Birth: 04/24/1976 Admit Type: Outpatient Age: 46 Room: [facility] ENDO ROOM 2 Gender: Female Note Status: Finalized Instrument Name: Park Meo [id number] Procedure:             Colonoscopy Indications:           Screening for colorectal malignant neoplasm, This is                         the patient's first colonoscopy Providers:             Lin Landsman MD, MD Medicines:             General Anesthesia Complications:         No immediate complications. Estimated blood loss: None. Procedure:             Pre-Anesthesia Assessment:                        - Prior to the procedure, a History and Physical was                         performed, and patient medications and allergies were                         reviewed. The patient is competent. The risks and                         benefits of the procedure and the sedation options and                         risks were discussed with the patient. All questions                         were answered and informed consent was obtained.                         Patient identification and proposed procedure were                         verified by the physician, the nurse, the                         anesthesiologist, the anesthetist and the technician                         in the pre-procedure area in the procedure room in the                         endoscopy suite. Mental Status Examination: alert and                         oriented. Airway Examination: normal oropharyngeal                         airway and neck mobility. Respiratory Examination:                         clear to auscultation.  CV Examination: normal.                         Prophylactic Antibiotics: The patient does not require                         prophylactic antibiotics. Prior Anticoagulants: The                          patient has taken no previous anticoagulant or                         antiplatelet agents. ASA Grade Assessment: II - A                         patient with mild systemic disease. After reviewing                         the risks and benefits, the patient was deemed in                         satisfactory condition to undergo the procedure. The                         anesthesia plan was to use general anesthesia.                         Immediately prior to administration of medications,                         the patient was re-assessed for adequacy to receive                         sedatives. The heart rate, respiratory rate, oxygen                         saturations, blood pressure, adequacy of pulmonary                         ventilation, and response to care were monitored                         throughout the procedure. The physical status of the                         patient was re-assessed after the procedure.                        After obtaining informed consent, the colonoscope was                         passed under direct vision. Throughout the procedure,                         the patient's blood pressure, pulse, and oxygen                         saturations were monitored continuously. The  Colonoscope was introduced through the anus and                         advanced to the the terminal ileum, with                         identification of the appendiceal orifice and IC                         valve. The colonoscopy was performed without                         difficulty. The patient tolerated the procedure well.                         The quality of the bowel preparation was evaluated                         using the BBPS [facility] Bowel Preparation Scale) with                         scores of: Right Colon = 3, Transverse Colon = 3 and                         Left Colon = 3 (entire mucosa seen well with no                          residual staining, small fragments of stool or opaque                         liquid). The total BBPS score equals 9. Findings:      The perianal and digital rectal examinations were normal. Pertinent       negatives include normal sphincter tone and no palpable rectal lesions.      The terminal ileum appeared normal.      A 5 mm polyp was found in the sigmoid colon. The polyp was sessile. The       polyp was removed with a cold snare. Resection and retrieval were       complete. Estimated blood loss: none.      Non-bleeding external hemorrhoids were found during retroflexion. The       hemorrhoids were medium-sized. Impression:            - The examined portion of the ileum was normal.                        - One 5 mm polyp in the sigmoid colon, removed with a                         cold snare. Resected and retrieved.                        - Non-bleeding external hemorrhoids. Recommendation:        - Discharge patient to home (with escort).                        - Resume previous diet today.                        -  Continue present medications.                        - Await pathology results.                        - Repeat colonoscopy in 7-10 years for surveillance                         based on pathology results. Procedure Code(s):     --- Professional ---                        [phone], Colonoscopy, flexible; with removal of                         tumor(s), polyp(s), or other lesion(s) by snare                         technique Diagnosis Code(s):     --- Professional ---                        Z12.11, Encounter for screening for malignant neoplasm                         of colon                        K64.4, Residual hemorrhoidal skin tags                        K63.5, Polyp of colon CPT copyright 2019 American Medical Association. All rights reserved. The codes documented in this report are preliminary and upon coder review may  be revised to meet current compliance  requirements. Dr. Ulyess Mort Lin Landsman MD, MD 09/06/2022 8:37:50 AM This report has been signed electronically. Number of Addenda: 0 Note Initiated On: 09/06/2022 8:13 AM Scope Withdrawal Time: 0 hours 10 minutes 9 seconds  Total Procedure Duration: 0 hours 14 minutes 59 seconds  Estimated Blood Loss:  Estimated blood loss: none.      [facility]

## 2022-09-06 NOTE — Transfer of Care (Signed)
Immediate Anesthesia Transfer of Care Note  Patient: Andrea Colon  Procedure(s) Performed: COLONOSCOPY WITH PROPOFOL  Patient Location: PACU  Anesthesia Type:General  Level of Consciousness: awake, alert  and oriented  Airway & Oxygen Therapy: Patient Spontanous Breathing  Post-op Assessment: Report given to RN and Post -op Vital signs reviewed and stable  Post vital signs: Reviewed and stable  Last Vitals:  Vitals Value Taken Time  BP 101/59 09/06/22 0839  Temp 35.8 C 09/06/22 0837  Pulse 72 09/06/22 0841  Resp 13 09/06/22 0841  SpO2 99 % 09/06/22 0841  Vitals shown include unvalidated device data.  Last Pain:  Vitals:   09/06/22 0837  TempSrc: Temporal  PainSc: 0-No pain         Complications: No notable events documented.

## 2022-09-06 NOTE — Anesthesia Preprocedure Evaluation (Signed)
Anesthesia Evaluation  Patient identified by MRN, date of birth, ID band Patient awake    Reviewed: Allergy & Precautions, NPO status , Patient's Chart, lab work & pertinent test results  History of Anesthesia Complications Negative for: history of anesthetic complications  Airway Mallampati: III  TM Distance: >3 FB Neck ROM: Full    Dental no notable dental hx.    Pulmonary neg pulmonary ROS, former smoker,    Pulmonary exam normal breath sounds clear to auscultation       Cardiovascular Exercise Tolerance: Good negative cardio ROS Normal cardiovascular exam Rhythm:Regular Rate:Normal     Neuro/Psych PSYCHIATRIC DISORDERS Anxiety Depression SI joint inflammation   Neuromuscular disease    GI/Hepatic Neg liver ROS, GERD  ,  Endo/Other  negative endocrine ROS  Renal/GU negative Renal ROS  negative genitourinary   Musculoskeletal negative musculoskeletal ROS (+)   Abdominal   Peds negative pediatric ROS (+)  Hematology negative hematology ROS (+)   Anesthesia Other Findings   Reproductive/Obstetrics negative OB ROS                             Anesthesia Physical Anesthesia Plan  ASA: 2  Anesthesia Plan: General   Post-op Pain Management: Minimal or no pain anticipated   Induction: Intravenous  PONV Risk Score and Plan: 3 and Propofol infusion and TIVA  Airway Management Planned: Natural Airway and Nasal Cannula  Additional Equipment:   Intra-op Plan:   Post-operative Plan:   Informed Consent: I have reviewed the patients History and Physical, chart, labs and discussed the procedure including the risks, benefits and alternatives for the proposed anesthesia with the patient or authorized representative who has indicated his/her understanding and acceptance.     Dental Advisory Given  Plan Discussed with: Anesthesiologist, CRNA and Surgeon  Anesthesia Plan Comments:  (Patient consented for risks of anesthesia including but not limited to:  - adverse reactions to medications - risk of airway placement if required - damage to eyes, teeth, lips or other oral mucosa - nerve damage due to positioning  - sore throat or hoarseness - Damage to heart, brain, nerves, lungs, other parts of body or loss of life  Patient voiced understanding.)        Anesthesia Quick Evaluation

## 2022-09-06 NOTE — Anesthesia Postprocedure Evaluation (Signed)
Anesthesia Post Note  Patient: Andrea Colon  Procedure(s) Performed: COLONOSCOPY WITH PROPOFOL  Patient location during evaluation: Endoscopy Anesthesia Type: General Level of consciousness: awake and alert Pain management: pain level controlled Vital Signs Assessment: post-procedure vital signs reviewed and stable Respiratory status: spontaneous breathing, nonlabored ventilation, respiratory function stable and patient connected to nasal cannula oxygen Cardiovascular status: blood pressure returned to baseline and stable Postop Assessment: no apparent nausea or vomiting Anesthetic complications: no   No notable events documented.   Last Vitals:  Vitals:   09/06/22 0837 09/06/22 0847  BP: (!) 101/59   Pulse:    Resp:    Temp: (!) 35.8 C   SpO2:  99%    Last Pain:  Vitals:   09/06/22 0857  TempSrc:   PainSc: 0-No pain                 Ilene Qua

## 2022-09-06 NOTE — H&P (Signed)
Andrea Darby, MD [address]  [place]  [place], [place] [address]  Main: [phone]  Fax: [phone] Pager: [phone]  Primary Care Physician:  Andrea Sanders, MD Primary Gastroenterologist:  Dr. Cephas Colon  Pre-Procedure History & Physical: HPI:  Andrea Colon is a 46 y.o. female is here for an colonoscopy.   Past Medical History:  Diagnosis Date   Acne    ADD (attention deficit disorder)    Anemia    H/O   Anxiety    B12 deficiency    Back pain    Bilateral swelling of feet and ankles    Chronic SI joint pain    Constipation    Depression    Edema of both lower extremities    Family history of adverse reaction to anesthesia    Gallbladder problem    GERD (gastroesophageal reflux disease)    High triglycerides    Hyperlipidemia    Hypertension    Insomnia    Joint pain    Neuromuscular disorder (HCC)    SI Joint inflamation   Other fatigue    Palpitations    PCOS (polycystic ovarian syndrome)    Pre-diabetes    Shortness of breath on exertion    SI (sacroiliac) joint dysfunction    Vitamin D deficiency     Past Surgical History:  Procedure Laterality Date   APPENDECTOMY     2005    CESAREAN SECTION  11/17/2012   CHOLECYSTECTOMY  03/31/2014   DILATION AND CURETTAGE OF UTERUS  2010, 07/2010   Polyps   HYSTEROSALPINGOGRAM  07/23/2010   [facility]   TONSILLECTOMY N/A 04/09/2017   Procedure: TONSILLECTOMY;  Surgeon: Andrea Canterbury, MD;  Location: [facility] ORS;  Service: ENT;  Laterality: N/A;    Prior to Admission medications   Medication Sig Start Date End Date Taking? Authorizing Provider  amphetamine-dextroamphetamine (ADDERALL) 10 MG tablet Take 10 mg by mouth 2 (two) times daily. 06/07/22  Yes [provider]  clonazePAM (KLONOPIN) 0.5 MG tablet Take 1 tablet (0.5 mg total) by mouth at bedtime. 08/14/22  Yes Colon, Andrea E, MD  escitalopram (LEXAPRO) 5 MG tablet Take 10 mg by mouth daily. 07/11/22  Yes [provider]  norethindrone-ethinyl estradiol-FE (LOESTRIN FE) 1-20 MG-MCG tablet Take 1 tablet by mouth daily. 08/29/22  Yes Andrea Dom, MD  spironolactone (ALDACTONE) 50 MG tablet Take 50 mg by mouth daily.   Yes [provider]  amphetamine-dextroamphetamine (ADDERALL XR) 20 MG 24 hr capsule Take 20 mg by mouth daily.    [provider]  tretinoin (RETIN-A) 0.025 % cream as needed. 05/27/22   [provider]  Vitamin D, Cholecalciferol, 25 MCG (1000 UT) TABS Take by mouth.    [provider]  zaleplon (SONATA) 5 MG capsule Take 1 capsule (5 mg total) by mouth at bedtime. 08/14/22   Andrea Sanders, MD    Allergies as of 06/19/2022 - Review Complete 06/19/2022  Allergen Reaction Noted   Sulfa antibiotics Hives 01/16/2012    Family History  Problem Relation Age of Onset   Anxiety disorder Mother    Hypertension Mother    Cancer Mother 51       Non-sm cell neuro endocrine tumors   Alcoholism Father    Anxiety disorder Father    Aneurysm Father    Breast cancer Paternal Aunt 42   Anxiety disorder Other    Alcoholism Other     Social History  Socioeconomic History   Marital status: Married    Spouse name: Andrea Colon   Number of children: 1   Years of education: Not on file   Highest education level: Not on file  Occupational History   Occupation: Product/process development scientist: [employer]  Tobacco Use   Smoking status: Former    Packs/day: 1.00    Years: 10.00    Total pack years: 10.00    Types: Cigarettes    Quit date: 02/08/2015    Years since quitting: 7.5   Smokeless tobacco: Never  Vaping Use   Vaping Use: Never used  Substance and Sexual Activity   Alcohol use: No    Alcohol/week: 0.0 standard drinks of alcohol   Drug use: No   Sexual activity: Yes    Partners: Male    Birth control/protection: Abstinence  Other Topics Concern   Not on file  Social History Narrative   Not on file   Social Determinants of  Health   Financial Resource Strain: Not on file  Food Insecurity: Not on file  Transportation Needs: Not on file  Physical Activity: Not on file  Stress: Not on file  Social Connections: Not on file  Intimate Partner Violence: Not on file    Review of Systems: See HPI, otherwise negative ROS  Physical Exam: BP 123/86   Pulse 83   Temp (!) 96.8 F (36 C) (Temporal)   Resp 18   Ht '5\' 4"'$  (1.626 m)   Wt 92.5 kg   SpO2 97%   BMI 35.02 kg/m  General:   Alert,  pleasant and cooperative in NAD Head:  Normocephalic and atraumatic. Neck:  Supple; no masses or thyromegaly. Lungs:  Clear throughout to auscultation.    Heart:  Regular rate and rhythm. Abdomen:  Soft, nontender and nondistended. Normal bowel sounds, without guarding, and without rebound.   Neurologic:  Alert and  oriented x4;  grossly normal neurologically.  Impression/Plan: Andrea Colon is here for an colonoscopy to be performed for colon cancer screening  Risks, benefits, limitations, and alternatives regarding  colonoscopy have been reviewed with the patient.  Questions have been answered.  All parties agreeable.   Andrea Sear, MD  09/06/2022, 8:08 AM

## 2022-09-09 ENCOUNTER — Encounter: Payer: Self-pay | Admitting: Gastroenterology

## 2022-09-09 LAB — SURGICAL PATHOLOGY

## 2022-09-12 ENCOUNTER — Encounter: Payer: Self-pay | Admitting: Obstetrics and Gynecology

## 2022-09-12 ENCOUNTER — Ambulatory Visit (INDEPENDENT_AMBULATORY_CARE_PROVIDER_SITE_OTHER): Payer: 59 | Admitting: Clinical

## 2022-09-12 DIAGNOSIS — F902 Attention-deficit hyperactivity disorder, combined type: Secondary | ICD-10-CM | POA: Diagnosis not present

## 2022-09-12 DIAGNOSIS — F331 Major depressive disorder, recurrent, moderate: Secondary | ICD-10-CM

## 2022-09-12 DIAGNOSIS — F411 Generalized anxiety disorder: Secondary | ICD-10-CM

## 2022-09-12 NOTE — Progress Notes (Signed)
Time: 9:01am-10:00am CPT Code: 24235T (IN PERSON) Diagnosis Code: F90.2, F41.1, F33.2  Andrea Colon was seen in person for therapy. Session began by processing anxiety Andrea Colon had experienced as a result of going two weeks between sessions. Therapist processed this with her and provided validation and support, as well as psychoeducation. She then shared further information about traumatic events from her childhood, and was able to read paragraphs she had written prior to session aloud, rather than handing them to the therapist. She is scheduled to be seen again in one week.    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 She expresses herself well in writing.  Client Treatment Preferences  Andrea Colon prefers in-person sessions, during school hours  Client Statement of Needs  Andrea Colon shared that she is seeking parenting skills, as well as healthier coping skills.  Treatment Level  Weekly  Symptoms  Anxiety : restlessness, difficulty relaxing, racing thoughts, difficulty concentrating, difficulty with creating and adhering to plans and routines (Status: maintained). Depression: tearfulness (Status: maintained).  Problems Addressed  New Description, New Description  Goals 1. Andrea Colon experiences difficulty with executive functioning Andrea Katy will develop and implement organizational strategies Target Date: 2023-03-27 Frequency: Weekly  Progress: 0 Modality: individual  Related Interventions Therapist will help Abigaelle to consider organizational strategies in her day-to-day life, incorporating manualized treatments such as [name] Therapist will provide opportunities to process parenting experiences in session and incorporate strategies from manualized parent training programs 2. Latora frequently experiences feelings of overwhelm that are difficult to cope with Andrea Colon would like to develop coping  strategies and reduce the frequency of meltdowns  Target Date: 2023-03-27 Frequency: Weekly  Progress: 0 Modality: individual  Related Interventions Andrea Colon will be provided opportunities to process experiences in sessions Therapist will provide referrals for additional resources as appropriate  Therapist will help Andrea Colon to identify and disengage from maladaptive thoughts and behaviors using CBT-based strategies Therapist will provide emotion regulation strategies including mindfulness, relaxation, and self care techniques Diagnosis Axis none 300.02 (Generalized anxiety disorder) - Open - [Signifier: n/a]    Axis none 314.01 (Attention deficit disorder with hyperactivity) - Open - [Signifier: n/a]    Conditions For Discharge Achievement of treatment goals and objectives          [name], PhD               [name], PhD

## 2022-09-19 ENCOUNTER — Ambulatory Visit (INDEPENDENT_AMBULATORY_CARE_PROVIDER_SITE_OTHER): Payer: 59 | Admitting: Clinical

## 2022-09-19 DIAGNOSIS — F331 Major depressive disorder, recurrent, moderate: Secondary | ICD-10-CM

## 2022-09-19 DIAGNOSIS — F902 Attention-deficit hyperactivity disorder, combined type: Secondary | ICD-10-CM

## 2022-09-19 DIAGNOSIS — F411 Generalized anxiety disorder: Secondary | ICD-10-CM | POA: Diagnosis not present

## 2022-09-19 NOTE — Progress Notes (Signed)
Time: 9:01am-10:00am CPT Code: 29924Q (IN PERSON) Diagnosis Code: F90.2, F41.1, F33.2  Andrea Colon was seen in person for therapy. Session focused on her experience of taking her son for an evaluation. Therapist processed this with her, providing validation and support. Toward the end of session, she disclosed an incident of self-harm that had taken place since her last session by revealing a series of shallow cuts just above the knee of her left leg. She assured the therapist that she is safe. Therapist indicated a plan to check in on self-harm at the beginning of each session to ensure enough time to process and devise coping strategies. Suicidal ideation and intent were denied. She is scheduled to be seen again in one week.    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 She expresses herself well in writing.  Client Treatment Preferences  Andrea Colon prefers in-person sessions, during school hours  Client Statement of Needs  Andrea Colon shared that she is seeking parenting skills, as well as healthier coping skills.  Treatment Level  Weekly  Symptoms  Anxiety : restlessness, difficulty relaxing, racing thoughts, difficulty concentrating, difficulty with creating and adhering to plans and routines (Status: maintained). Depression: tearfulness (Status: maintained).  Problems Addressed  New Description, New Description  Goals 1. Andrea Colon experiences difficulty with executive functioning Objective Andrea Colon will develop and implement organizational strategies Target Date: 2023-03-27 Frequency: Weekly  Progress: 0 Modality: individual  Related Interventions Therapist will help Bralynn to consider organizational strategies in her day-to-day life, incorporating manualized treatments such as [name] Therapist will provide opportunities to process parenting experiences in session and incorporate strategies from manualized parent  training programs 2. Andrea Colon frequently experiences feelings of overwhelm that are difficult to cope with Objective Andrea Colon would like to develop coping strategies and reduce the frequency of meltdowns  Target Date: 2023-03-27 Frequency: Weekly  Progress: 0 Modality: individual  Related Interventions Andrea Colon will be provided opportunities to process experiences in sessions Therapist will provide referrals for additional resources as appropriate  Therapist will help Andrea Colon to identify and disengage from maladaptive thoughts and behaviors using CBT-based strategies Therapist will provide emotion regulation strategies including mindfulness, relaxation, and self care techniques Diagnosis Axis none 300.02 (Generalized anxiety disorder) - Open - [Signifier: n/a]    Axis none 314.01 (Attention deficit disorder with hyperactivity) - Open - [Signifier: n/a]    Conditions For Discharge Achievement of treatment goals and objectives           [name], PhD               [name], PhD

## 2022-09-20 ENCOUNTER — Encounter: Payer: Self-pay | Admitting: Family Medicine

## 2022-09-20 ENCOUNTER — Ambulatory Visit (INDEPENDENT_AMBULATORY_CARE_PROVIDER_SITE_OTHER): Payer: 59 | Admitting: Family Medicine

## 2022-09-20 VITALS — BP 104/72 | HR 98 | Temp 98.0°F | Ht 64.0 in | Wt 205.4 lb

## 2022-09-20 DIAGNOSIS — Z23 Encounter for immunization: Secondary | ICD-10-CM

## 2022-09-20 DIAGNOSIS — F330 Major depressive disorder, recurrent, mild: Secondary | ICD-10-CM | POA: Diagnosis not present

## 2022-09-20 DIAGNOSIS — I10 Essential (primary) hypertension: Secondary | ICD-10-CM | POA: Diagnosis not present

## 2022-09-20 DIAGNOSIS — Z6835 Body mass index (BMI) 35.0-35.9, adult: Secondary | ICD-10-CM

## 2022-09-20 NOTE — Progress Notes (Signed)
Patient ID: Andrea Colon, female    DOB: 1976/10/31, 46 y.o.   MRN: [id number]  This visit was conducted in person.  BP 104/72   Pulse 98   Temp 98 F (36.7 C) (Oral)   Ht '5\' 4"'$  (1.626 m)   Wt 205 lb 6 oz (93.2 kg)   LMP 09/14/2022   SpO2 99%   BMI 35.25 kg/m    CC:  Chief Complaint  Patient presents with   Follow-up    Mood      Subjective:   HPI: Andrea Colon is a 46 y.o. female presenting on 09/20/2022 for Follow-up (Mood)  MDD, GAD, chronic insomnia ADD:  Good control on lexapro 10 mg daily, vyvanse 50 mg daily/  occ adderall 10 mg later in day if needed.  Clonazepam 0.5 mg  at night, using sonata 5 mg prn insomnia (she is trying to wean off) occ taking old Ambein she used to take.   Seeing therapist weekly.     09/20/2022    3:02 PM 06/18/2022    2:21 PM 03/12/2022   11:41 AM 01/29/2022    9:42 AM 01/01/2022   10:36 AM  Depression screen PHQ 2/9  Decreased Interest '2 3 1 1 '$ 0  Down, Depressed, Hopeless '1 1 1 2 1  '$ PHQ - 2 Score '3 4 2 3 1  '$ Altered sleeping '3 1 1 2 2  '$ Tired, decreased energy 1 2 0 2 0  Change in appetite 0 2 0 1 1  Feeling bad or failure about yourself  '2 1 2 3 2  '$ Trouble concentrating '1 2 2 2 1  '$ Moving slowly or fidgety/restless 0 1 0 1 1  Suicidal thoughts '2 2 2 2 2  '$ PHQ-9 Score '12 15 9 16 10  '$ Difficult doing work/chores Somewhat difficult Somewhat difficult Somewhat difficult Somewhat difficult Somewhat difficult      09/20/2022    3:02 PM 06/18/2022    2:22 PM 03/12/2022   11:42 AM 01/29/2022    9:43 AM  GAD 7 : Generalized Anxiety Score  Nervous, Anxious, on Edge '1 2 2 2  '$ Control/stop worrying '1 1 1 2  '$ Worry too much - different things '2 1 1 2  '$ Trouble relaxing '3 1 1 2  '$ Restless '1 1 1 1  '$ Easily annoyed or irritable '2 2 1 2  '$ Afraid - awful might happen 1 0 1 2  Total GAD 7 Score '11 8 8 13  '$ Anxiety Difficulty Somewhat difficult Somewhat difficult Somewhat difficult Somewhat difficult     She has lost 67.7 lbs in last 12  months. Body mass index is 35.25 kg/m. Wt Readings from Last 3 Encounters:  09/20/22 205 lb 6 oz (93.2 kg)  09/06/22 204 lb (92.5 kg)  08/29/22 207 lb (93.9 kg)    She did trial of OCPs for DUB... stopped due to itching. Menses has improved.. not as heavy.     Relevant past medical, surgical, family and social history reviewed and updated as indicated. Interim medical history since our last visit reviewed. Allergies and medications reviewed and updated. Outpatient Medications Prior to Visit  Medication Sig Dispense Refill   ascorbic acid (VITAMIN C) 500 MG tablet Take 500 mg by mouth at bedtime.     clonazePAM (KLONOPIN) 0.5 MG tablet Take 1 tablet (0.5 mg total) by mouth at bedtime. 30 tablet 0   escitalopram (LEXAPRO) 5 MG tablet Take 10 mg by mouth daily.     MAGNESIUM PO  Take 1 tablet by mouth in the morning and at bedtime.     norethindrone-ethinyl estradiol-FE (LOESTRIN FE) 1-20 MG-MCG tablet Take 1 tablet by mouth daily. 84 tablet 0   Omega-3 Fatty Acids (FISH OIL PO) Take 2,200 mg by mouth daily.     spironolactone (ALDACTONE) 50 MG tablet Take 50 mg by mouth 2 (two) times daily.     tretinoin (RETIN-A) 0.025 % cream as needed.     Vitamin D, Cholecalciferol, 25 MCG (1000 UT) TABS Take 2 tablets by mouth daily.     VYVANSE 50 MG capsule Take 50 mg by mouth every morning.     zaleplon (SONATA) 5 MG capsule Take 1 capsule (5 mg total) by mouth at bedtime. 30 capsule 0   amphetamine-dextroamphetamine (ADDERALL XR) 20 MG 24 hr capsule Take 20 mg by mouth daily.     amphetamine-dextroamphetamine (ADDERALL) 10 MG tablet Take 10 mg by mouth 2 (two) times daily.     No facility-administered medications prior to visit.     Per HPI unless specifically indicated in ROS section below Review of Systems  Constitutional:  Negative for fatigue and fever.  HENT:  Negative for congestion.   Eyes:  Negative for pain.  Respiratory:  Negative for cough and shortness of breath.    Cardiovascular:  Negative for chest pain, palpitations and leg swelling.  Gastrointestinal:  Negative for abdominal pain.  Genitourinary:  Negative for dysuria and vaginal bleeding.  Musculoskeletal:  Negative for back pain.  Neurological:  Negative for syncope, light-headedness and headaches.  Psychiatric/Behavioral:  Negative for dysphoric mood.    Objective:  BP 104/72   Pulse 98   Temp 98 F (36.7 C) (Oral)   Ht '5\' 4"'$  (1.626 m)   Wt 205 lb 6 oz (93.2 kg)   LMP 09/14/2022   SpO2 99%   BMI 35.25 kg/m   Wt Readings from Last 3 Encounters:  09/20/22 205 lb 6 oz (93.2 kg)  09/06/22 204 lb (92.5 kg)  08/29/22 207 lb (93.9 kg)      Physical Exam Vitals and nursing note reviewed.  Constitutional:      General: She is not in acute distress.    Appearance: Normal appearance. She is well-developed. She is obese. She is not ill-appearing or toxic-appearing.  HENT:     Head: Normocephalic.     Right Ear: Hearing, tympanic membrane, ear canal and external ear normal.     Left Ear: Hearing, tympanic membrane, ear canal and external ear normal.     Nose: Nose normal.  Eyes:     General: Lids are normal. Lids are everted, no foreign bodies appreciated.     Conjunctiva/sclera: Conjunctivae normal.     Pupils: Pupils are equal, round, and reactive to light.  Neck:     Thyroid: No thyroid mass or thyromegaly.     Vascular: No carotid bruit.     Trachea: Trachea normal.  Cardiovascular:     Rate and Rhythm: Normal rate and regular rhythm.     Heart sounds: Normal heart sounds, S1 normal and S2 normal. No murmur heard.    No gallop.  Pulmonary:     Effort: Pulmonary effort is normal. No respiratory distress.     Breath sounds: Normal breath sounds. No wheezing, rhonchi or rales.  Abdominal:     General: Bowel sounds are normal. There is no distension or abdominal bruit.     Palpations: Abdomen is soft. There is no fluid wave or mass.  Tenderness: There is no abdominal  tenderness. There is no guarding or rebound.     Hernia: No hernia is present.  Musculoskeletal:     Cervical back: Normal range of motion and neck supple.  Lymphadenopathy:     Cervical: No cervical adenopathy.  Skin:    General: Skin is warm and dry.     Findings: No rash.  Neurological:     Mental Status: She is alert.     Cranial Nerves: No cranial nerve deficit.     Sensory: No sensory deficit.  Psychiatric:        Mood and Affect: Mood is not anxious or depressed.        Speech: Speech normal.        Behavior: Behavior normal. Behavior is cooperative.        Judgment: Judgment normal.       Results for orders placed or performed during the hospital encounter of 09/06/22  Pregnancy, urine POC  Result Value Ref Range   Preg Test, Ur NEGATIVE NEGATIVE  Surgical pathology  Result Value Ref Range   SURGICAL PATHOLOGY      SURGICAL PATHOLOGY CASE: [phone] PATIENT: Henri Medal Surgical Pathology Report     Specimen Submitted: A. Colon polyp, sigmoid; cold snare  Clinical History: Screening colonoscopy.  Colon polyp, external hemorrhoids    DIAGNOSIS: A. COLON POLYP, SIGMOID; COLD SNARE: - SESSILE SERRATED POLYP. - NEGATIVE FOR DYSPLASIA AND MALIGNANCY.  GROSS DESCRIPTION: A. Labeled: Sigmoid colon polyp cold snare Received: Formalin Collection time: 8:32 AM on 09/06/2022 Placed into formalin time: 8:32 AM on 09/06/2022 Tissue fragment(s): 2 Size: Range from 1-1.6 cm Description: Received are 2 fragments of tan-white translucent soft tissue.  The resection margins are differentially inked and the fragments are serially sectioned. Entirely submitted in cassettes 1-2 with 1 serially sectioned fragment per cassette.  RB 09/06/2022  Final Diagnosis performed by Allena Napoleon, MD.   Electronically signed 09/09/2022 10:45:40AM The electronic signature indicates that the  named Attending Pathologist has evaluated the specimen Technical component  performed at [facility], [address] [place], [place] [address] Lab: [phone] Dir: Rush Farmer, MD, MMM  Professional component performed at [facility], [facility], [place], [place], [place] [address] Lab: [phone] Dir: Kathi Simpers, MD      COVID 19 screen:  No recent travel or known exposure to [place] The patient denies respiratory symptoms of COVID 19 at this time. The importance of social distancing was discussed today.   Assessment and Plan Problem List Items Addressed This Visit     Depression, major, recurrent, mild (HCC) (Chronic)    Chronic, good control on current regimen of Lexapro 10 mg daily and clonazepam 0.5 mg at night if needed for anxiety.  She occasionally is using Sonata 5 mg as needed insomnia but is trying to wean off.  She will continue seeing therapy once a week.      Class 2 severe obesity due to excess calories with serious comorbidity and body mass index (BMI) of 35.0 to 35.9 in adult [facility])    Chronic, improving She is continuing to do wonderfully with lifestyle changes and weight management she continues to be followed by Dr[facility].      Relevant Medications   VYVANSE 50 MG capsule   Essential hypertension, benign    Chronic, improving control with weight loss using spironolactone for alternate reason ( acne) but is likely helping with blood pressure control. BP Readings  from Last 3 Encounters:  09/20/22 104/72  09/06/22 (!) 101/59  08/29/22 110/72         Other Visit Diagnoses     Need for Td vaccine    -  Primary   Relevant Orders   Td vaccine greater than or equal to 7yo preservative free IM (Completed)   Need for influenza vaccination       Relevant Orders   Flu Vaccine QUAD 24moIM (Fluarix, Fluzone & Alfiuria Quad PF) (Completed)          AEliezer Lofts MD

## 2022-09-20 NOTE — Assessment & Plan Note (Signed)
Chronic, improving She is continuing to do wonderfully with lifestyle changes and weight management she continues to be followed by Dr[facility].

## 2022-09-20 NOTE — Assessment & Plan Note (Signed)
Chronic, improving control with weight loss using spironolactone for alternate reason ( acne) but is likely helping with blood pressure control. BP Readings from Last 3 Encounters:  09/20/22 104/72  09/06/22 (!) 101/59  08/29/22 110/72

## 2022-09-20 NOTE — Assessment & Plan Note (Signed)
Chronic, good control on current regimen of Lexapro 10 mg daily and clonazepam 0.5 mg at night if needed for anxiety.  She occasionally is using Sonata 5 mg as needed insomnia but is trying to wean off.  She will continue seeing therapy once a week.

## 2022-09-22 ENCOUNTER — Other Ambulatory Visit: Payer: Self-pay | Admitting: Family Medicine

## 2022-09-23 NOTE — Telephone Encounter (Signed)
Last office visit 09/20/22 for follow up mood.  Last refilled Sonata 08/14/22 for #30 with no refills.  Clonazepam 08/14/22 for #30 with no refills.  CPE scheduled for 06/20/23.

## 2022-09-24 MED ORDER — ZALEPLON 5 MG PO CAPS: 5.0000 mg | ORAL_CAPSULE | Freq: Every day | ORAL | 0 refills | Status: DC

## 2022-09-24 MED ORDER — CLONAZEPAM 0.5 MG PO TABS: 0.5000 mg | ORAL_TABLET | Freq: Every day | ORAL | 0 refills | Status: DC

## 2022-09-26 ENCOUNTER — Ambulatory Visit (INDEPENDENT_AMBULATORY_CARE_PROVIDER_SITE_OTHER): Payer: 59 | Admitting: Clinical

## 2022-09-26 DIAGNOSIS — F331 Major depressive disorder, recurrent, moderate: Secondary | ICD-10-CM | POA: Diagnosis not present

## 2022-09-26 DIAGNOSIS — F902 Attention-deficit hyperactivity disorder, combined type: Secondary | ICD-10-CM | POA: Diagnosis not present

## 2022-09-26 DIAGNOSIS — F411 Generalized anxiety disorder: Secondary | ICD-10-CM

## 2022-09-26 NOTE — Progress Notes (Signed)
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 Session focused on processing her disclosure of self-harm at the end of the previous session. She connected it to increasing tension in her marriage. Therapist encouraged her to consider next steps, considering how to model healthy boundary setting for her son. She is scheduled to be seen again in one week, and agreed not to engage in self-harm in the coming week. Suicidal ideation and intent were denied.    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 She expresses herself well in writing.  Client Treatment Preferences  Andrea Colon prefers in-person sessions, during school hours  Client Statement of Needs  Andrea Colon shared that she is seeking parenting skills, as well as healthier coping skills.  Treatment Level  Weekly  Symptoms  Anxiety : restlessness, difficulty relaxing, racing thoughts, difficulty concentrating, difficulty with creating and adhering to plans and routines (Status: maintained). Depression: tearfulness (Status: maintained).  Problems Addressed  New Description, New Description  Goals 1. Andrea Colon experiences difficulty with executive functioning Objective Sister will develop and implement organizational strategies Target Date: 2023-03-27 Frequency: Weekly  Progress: 0 Modality: individual  Related Interventions Therapist will help Vincy to consider organizational strategies in her day-to-day life, incorporating manualized treatments such as [name] Therapist will provide opportunities to process parenting experiences in session and incorporate strategies from manualized parent training programs 2. Andrea Colon frequently experiences feelings of overwhelm that are difficult to cope with Objective Andrea Colon would like to develop coping strategies and reduce the frequency of  meltdowns  Target Date: 2023-03-27 Frequency: Weekly  Progress: 0 Modality: individual  Related Interventions Andrea Colon will be provided opportunities to process experiences in sessions Therapist will provide referrals for additional resources as appropriate  Therapist will help Andrea Colon to identify and disengage from maladaptive thoughts and behaviors using CBT-based strategies Therapist will provide emotion regulation strategies including mindfulness, relaxation, and self care techniques Diagnosis Axis none 300.02 (Generalized anxiety disorder) - Open - [Signifier: n/a]    Axis none 314.01 (Attention deficit disorder with hyperactivity) - Open - [Signifier: n/a]    Conditions For Discharge Achievement of treatment goals and objectives             [name], PhD               [name], PhD

## 2022-09-30 ENCOUNTER — Encounter: Payer: Self-pay | Admitting: Family Medicine

## 2022-09-30 NOTE — Addendum Note (Signed)
Addended by: Carter Kitten on: 09/30/2022 11:41 AM   Modules accepted: Orders

## 2022-09-30 NOTE — Telephone Encounter (Signed)
Patient called in and stated that Clonazepam is on back order at [facility]. She would like for a script to be sent in to [place], [place]. She stated she only has half a tablet left. Thank you!

## 2022-10-01 MED ORDER — CLONAZEPAM 0.5 MG PO TABS: 0.5000 mg | ORAL_TABLET | Freq: Every day | ORAL | 0 refills | Status: DC

## 2022-10-01 NOTE — Telephone Encounter (Signed)
Patient called back in to follow up on this request. She is completed out and getting concerned.

## 2022-10-04 ENCOUNTER — Ambulatory Visit (INDEPENDENT_AMBULATORY_CARE_PROVIDER_SITE_OTHER): Payer: 59 | Admitting: Clinical

## 2022-10-04 DIAGNOSIS — F331 Major depressive disorder, recurrent, moderate: Secondary | ICD-10-CM

## 2022-10-04 DIAGNOSIS — F900 Attention-deficit hyperactivity disorder, predominantly inattentive type: Secondary | ICD-10-CM

## 2022-10-04 DIAGNOSIS — F411 Generalized anxiety disorder: Secondary | ICD-10-CM

## 2022-10-04 NOTE — Progress Notes (Signed)
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 Session focused on dynamics in her family over the past week. Therapist suggested strategies in response to parenting challenges she reported experiencing. She has not engaged in self harm or experienced suicidal ideation in the last week, and agreed to not engage in self-harm over the coming week.    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 She expresses herself well in writing.  Client Treatment Preferences  Andrea Colon prefers in-person sessions, during school hours  Client Statement of Needs  Andrea Colon shared that she is seeking parenting skills, as well as healthier coping skills.  Treatment Level  Weekly  Symptoms  Anxiety : restlessness, difficulty relaxing, racing thoughts, difficulty concentrating, difficulty with creating and adhering to plans and routines (Status: maintained). Depression: tearfulness (Status: maintained).  Problems Addressed  New Description, New Description  Goals 1. Andrea Colon experiences difficulty with executive functioning Objective Andrea Colon will develop and implement organizational strategies Target Date: 2023-03-27 Frequency: Weekly  Progress: 0 Modality: individual  Related Interventions Therapist will help Andrea Colon to consider organizational strategies in her day-to-day life, incorporating manualized treatments such as [name] Therapist will provide opportunities to process parenting experiences in session and incorporate strategies from manualized parent training programs 2. Andrea Colon frequently experiences feelings of overwhelm that are difficult to cope with Objective Andrea Colon would like to develop coping strategies and reduce the frequency of meltdowns  Target Date: 2023-03-27 Frequency: Weekly  Progress: 0 Modality: individual  Related  Interventions Andrea Colon will be provided opportunities to process experiences in sessions Therapist will provide referrals for additional resources as appropriate  Therapist will help [name] to identify and disengage from maladaptive thoughts and behaviors using CBT-based strategies Therapist will provide emotion regulation strategies including mindfulness, relaxation, and self care techniques Diagnosis Axis none 300.02 (Generalized anxiety disorder) - Open - [Signifier: n/a]    Axis none 314.01 (Attention deficit disorder with hyperactivity) - Open - [Signifier: n/a]    Conditions For Discharge Achievement of treatment goals and objectives                  [name], PhD               [name], PhD

## 2022-10-10 ENCOUNTER — Ambulatory Visit (INDEPENDENT_AMBULATORY_CARE_PROVIDER_SITE_OTHER): Payer: 59 | Admitting: Clinical

## 2022-10-10 DIAGNOSIS — F331 Major depressive disorder, recurrent, moderate: Secondary | ICD-10-CM | POA: Diagnosis not present

## 2022-10-10 DIAGNOSIS — F902 Attention-deficit hyperactivity disorder, combined type: Secondary | ICD-10-CM | POA: Diagnosis not present

## 2022-10-10 DIAGNOSIS — F411 Generalized anxiety disorder: Secondary | ICD-10-CM

## 2022-10-10 NOTE — Progress Notes (Signed)
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 Session focused on dynamics in her relationship, as well as processing her son's recent autism diagnosis. She did not engage in self-harm over the past week, and agreed not to in the coming week. She is scheduled to be seen again in one week.    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 She expresses herself well in writing.  Client Treatment Preferences  Andrea Colon prefers in-person sessions, during school hours  Client Statement of Needs  Andrea Colon shared that she is seeking parenting skills, as well as healthier coping skills.  Treatment Level  Weekly  Symptoms  Anxiety : restlessness, difficulty relaxing, racing thoughts, difficulty concentrating, difficulty with creating and adhering to plans and routines (Status: maintained). Depression: tearfulness (Status: maintained).  Problems Addressed  New Description, New Description  Goals 1. Andrea Colon experiences difficulty with executive functioning Objective Andrea Colon will develop and implement organizational strategies Target Date: 2023-03-27 Frequency: Weekly  Progress: 0 Modality: individual  Related Interventions Therapist will help Andrea Colon to consider organizational strategies in her day-to-day life, incorporating manualized treatments such as [name] Therapist will provide opportunities to process parenting experiences in session and incorporate strategies from manualized parent training programs 2. Andrea Colon frequently experiences feelings of overwhelm that are difficult to cope with Objective Andrea Colon would like to develop coping strategies and reduce the frequency of meltdowns  Target Date: 2023-03-27 Frequency: Weekly  Progress: 0 Modality: individual  Related Interventions Andrea Colon will be provided opportunities to process  experiences in sessions Therapist will provide referrals for additional resources as appropriate  Therapist will help Andrea Colon to identify and disengage from maladaptive thoughts and behaviors using CBT-based strategies Therapist will provide emotion regulation strategies including mindfulness, relaxation, and self care techniques Diagnosis Axis none 300.02 (Generalized anxiety disorder) - Open - [Signifier: n/a]    Axis none 314.01 (Attention deficit disorder with hyperactivity) - Open - [Signifier: n/a]    Conditions For Discharge Achievement of treatment goals and objectives                  Andrea Cruise, PhD               Andrea Cruise, PhD               Andrea Cruise, PhD

## 2022-10-17 ENCOUNTER — Ambulatory Visit (INDEPENDENT_AMBULATORY_CARE_PROVIDER_SITE_OTHER): Payer: 59 | Admitting: Clinical

## 2022-10-17 DIAGNOSIS — F331 Major depressive disorder, recurrent, moderate: Secondary | ICD-10-CM | POA: Diagnosis not present

## 2022-10-17 DIAGNOSIS — F411 Generalized anxiety disorder: Secondary | ICD-10-CM

## 2022-10-17 DIAGNOSIS — F900 Attention-deficit hyperactivity disorder, predominantly inattentive type: Secondary | ICD-10-CM | POA: Diagnosis not present

## 2022-10-17 NOTE — Progress Notes (Signed)
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 Session focused on her son's recent diagnosis, and how to share it with him. Therapist engaged her in a discussion of strategies she can use to share the diagnosis with him, including engaging him in a discussion of strengths that align with his diagnosis, as well as things that he has described as challenging that are attributable to it, and being prepared with role models that she can share. She is scheduled to be seen again in one week.  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 She expresses herself well in writing.  Client Treatment Preferences  Jody prefers in-person sessions, during school hours  Client Statement of Needs  Oluwatoni shared that she is seeking parenting skills, as well as healthier coping skills.  Treatment Level  Weekly  Symptoms  Anxiety : restlessness, difficulty relaxing, racing thoughts, difficulty concentrating, difficulty with creating and adhering to plans and routines (Status: maintained). Depression: tearfulness (Status: maintained).  Problems Addressed  New Description, New Description  Goals 1. [name] experiences difficulty with executive functioning Objective Sylvester will develop and implement organizational strategies Target Date: 2023-03-27 Frequency: Weekly  Progress: 0 Modality: individual  Related Interventions Therapist will help Jullianna to consider organizational strategies in her day-to-day life, incorporating manualized treatments such as [name] Therapist will provide opportunities to process parenting experiences in session and incorporate strategies from manualized parent training programs 2. Nitza frequently experiences feelings of overwhelm that are difficult to cope with Objective Kinya would like to develop coping strategies  and reduce the frequency of meltdowns  Target Date: 2023-03-27 Frequency: Weekly  Progress: 0 Modality: individual  Related Interventions Bao will be provided opportunities to process experiences in sessions Therapist will provide referrals for additional resources as appropriate  Therapist will help Kristilyn to identify and disengage from maladaptive thoughts and behaviors using CBT-based strategies Therapist will provide emotion regulation strategies including mindfulness, relaxation, and self care techniques Diagnosis Axis none 300.02 (Generalized anxiety disorder) - Open - [Signifier: n/a]    Axis none 314.01 (Attention deficit disorder with hyperactivity) - Open - [Signifier: n/a]    Conditions For Discharge Achievement of treatment goals and objectives                  [name], PhD               [name], PhD               [name], PhD               [name], PhD

## 2022-10-24 ENCOUNTER — Ambulatory Visit (INDEPENDENT_AMBULATORY_CARE_PROVIDER_SITE_OTHER): Payer: 59 | Admitting: Clinical

## 2022-10-24 DIAGNOSIS — F411 Generalized anxiety disorder: Secondary | ICD-10-CM

## 2022-10-24 DIAGNOSIS — F902 Attention-deficit hyperactivity disorder, combined type: Secondary | ICD-10-CM | POA: Diagnosis not present

## 2022-10-24 DIAGNOSIS — F332 Major depressive disorder, recurrent severe without psychotic features: Secondary | ICD-10-CM

## 2022-10-24 NOTE — Progress Notes (Signed)
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 When therapist checked in about self-harm, she disclosed that she had engaged in self harm, and provided a written account of the incident. She also showed the therapist the cuts on her mid-thigh, which appeared relatively shallow. Session focused on processing her feelings around this. Therapist encouraged her to connect with her anger and direct it outward. Suicidal ideation and intent were denied. For homework, she will try an alternate coping strategy when she has the urge to self-harm, such as screaming into a pillow. She is scheduled to be seen again in one week.  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 She expresses herself well in writing.  Client Treatment Preferences  Andrea Colon prefers in-person sessions, during school hours  Client Statement of Needs  Andrea Colon shared that she is seeking parenting skills, as well as healthier coping skills.  Treatment Level  Weekly  Symptoms  Anxiety : restlessness, difficulty relaxing, racing thoughts, difficulty concentrating, difficulty with creating and adhering to plans and routines (Status: maintained). Depression: tearfulness (Status: maintained).  Problems Addressed  New Description, New Description  Goals 1. Andrea Colon experiences difficulty with executive functioning Objective Andrea Colon will develop and implement organizational strategies Target Date: 2023-03-27 Frequency: Weekly  Progress: 0 Modality: individual  Related Interventions Therapist will help Andrea Colon to consider organizational strategies in her day-to-day life, incorporating manualized treatments such as [name] Therapist will provide opportunities to process parenting experiences in session and incorporate strategies from manualized parent training programs 2. Andrea Colon  frequently experiences feelings of overwhelm that are difficult to cope with Objective Andrea Colon would like to develop coping strategies and reduce the frequency of meltdowns  Target Date: 2023-03-27 Frequency: Weekly  Progress: 0 Modality: individual  Related Interventions Andrea Colon will be provided opportunities to process experiences in sessions Therapist will provide referrals for additional resources as appropriate  Therapist will help Andrea Colon to identify and disengage from maladaptive thoughts and behaviors using CBT-based strategies Therapist will provide emotion regulation strategies including mindfulness, relaxation, and self care techniques Diagnosis Axis none 300.02 (Generalized anxiety disorder) - Open - [Signifier: n/a]    Axis none 314.01 (Attention deficit disorder with hyperactivity) - Open - [Signifier: n/a]    Conditions For Discharge Achievement of treatment goals and objectives                  [name], PhD               [name], PhD               [name], PhD               [name], PhD               [name], PhD

## 2022-10-27 ENCOUNTER — Other Ambulatory Visit: Payer: Self-pay | Admitting: Family Medicine

## 2022-10-27 NOTE — Telephone Encounter (Signed)
Last office visit 09/20/22 for follow up mood.  Last refilled 10/01/22 for #30 with no refills.  CPE scheduled for 06/19/22.

## 2022-10-28 MED ORDER — CLONAZEPAM 0.5 MG PO TABS: 0.5000 mg | ORAL_TABLET | Freq: Every day | ORAL | 0 refills | Status: DC

## 2022-10-28 NOTE — Addendum Note (Signed)
Addended by: Carter Kitten on: 10/28/2022 12:49 PM   Modules accepted: Orders

## 2022-10-28 NOTE — Telephone Encounter (Signed)
Please try and resend.  Refill did not go through the first time.

## 2022-10-29 MED ORDER — CLONAZEPAM 0.5 MG PO TABS: 0.5000 mg | ORAL_TABLET | Freq: Every day | ORAL | 0 refills | Status: DC

## 2022-10-30 ENCOUNTER — Other Ambulatory Visit: Payer: Self-pay | Admitting: Family Medicine

## 2022-10-30 ENCOUNTER — Ambulatory Visit (INDEPENDENT_AMBULATORY_CARE_PROVIDER_SITE_OTHER): Payer: 59 | Admitting: Clinical

## 2022-10-30 DIAGNOSIS — F411 Generalized anxiety disorder: Secondary | ICD-10-CM

## 2022-10-30 DIAGNOSIS — F902 Attention-deficit hyperactivity disorder, combined type: Secondary | ICD-10-CM | POA: Diagnosis not present

## 2022-10-30 DIAGNOSIS — F331 Major depressive disorder, recurrent, moderate: Secondary | ICD-10-CM
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
## 2022-10-31 ENCOUNTER — Encounter: Payer: Self-pay | Admitting: Family Medicine

## 2022-10-31 MED ORDER — ZALEPLON 5 MG PO CAPS: 5.0000 mg | ORAL_CAPSULE | Freq: Every day | ORAL | 0 refills | Status: DC

## 2022-10-31 NOTE — Telephone Encounter (Signed)
Last office visit 09/20/22 for follow up mood.  Last refilled 09/24/22 for #30 with no refills.  CPE scheduled 06/20/23.

## 2022-11-07 ENCOUNTER — Ambulatory Visit: Payer: 59 | Admitting: Clinical

## 2022-11-21 ENCOUNTER — Ambulatory Visit (INDEPENDENT_AMBULATORY_CARE_PROVIDER_SITE_OTHER): Payer: 59 | Admitting: Clinical

## 2022-11-21 DIAGNOSIS — F331 Major depressive disorder, recurrent, moderate: Secondary | ICD-10-CM | POA: Diagnosis not present

## 2022-11-21 DIAGNOSIS — F902 Attention-deficit hyperactivity disorder, combined type: Secondary | ICD-10-CM

## 2022-11-21 DIAGNOSIS — F411 Generalized anxiety disorder: Secondary | ICD-10-CM

## 2022-11-21 NOTE — Progress Notes (Signed)
Time: 9:00 am-10:00am CPT Code: 26712W (IN PERSON) Diagnosis Code: F90.2, F41.1, F33.1  Andrea Colon was seen in person for therapy. She denied having engaged in self-harm since her last session and has been using the rubberband technique to manage the urge to self harm. She reported that she had found this effective. Session focused on dynamics in her marriage, including discussion of a recent argument.  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 She expresses herself well in writing.  Client Treatment Preferences  Ysabelle prefers in-person sessions, during school hours  Client Statement of Needs  Yulieth shared that she is seeking parenting skills, as well as healthier coping skills.  Treatment Level  Weekly  Symptoms  Anxiety : restlessness, difficulty relaxing, racing thoughts, difficulty concentrating, difficulty with creating and adhering to plans and routines (Status: maintained). Depression: tearfulness (Status: maintained).  Problems Addressed  New Description, New Description  Goals 1. [name] experiences difficulty with executive functioning Objective Fawne will develop and implement organizational strategies Target Date: 2023-03-27 Frequency: Weekly  Progress: 0 Modality: individual  Related Interventions Therapist will help Teylor to consider organizational strategies in her day-to-day life, incorporating manualized treatments such as [name] Therapist will provide opportunities to process parenting experiences in session and incorporate strategies from manualized parent training programs 2. Kasha frequently experiences feelings of overwhelm that are difficult to cope with Objective Trudie would like to develop coping strategies and reduce the frequency of meltdowns  Target Date: 2023-03-27 Frequency: Weekly  Progress: 0 Modality: individual  Related Interventions Yanis will be provided  opportunities to process experiences in sessions Therapist will provide referrals for additional resources as appropriate  Therapist will help Maebry to identify and disengage from maladaptive thoughts and behaviors using CBT-based strategies Therapist will provide emotion regulation strategies including mindfulness, relaxation, and self care techniques Diagnosis Axis none 300.02 (Generalized anxiety disorder) - Open - [Signifier: n/a]    Axis none 314.01 (Attention deficit disorder with hyperactivity) - Open - [Signifier: n/a]    Conditions For Discharge Achievement of treatment goals and objectives    Laroy Apple PhD               [name], PhD

## 2022-11-25 ENCOUNTER — Other Ambulatory Visit: Payer: Self-pay | Admitting: Family Medicine

## 2022-11-25 NOTE — Telephone Encounter (Signed)
Last office visit 09/20/2022 for follow up mood.  Last refilled clonazepam 10/29/22 for #30 with no refills.  Sonata 10/31/22 for #30 with no refills.  CPE scheduled 06/20/23.

## 2022-11-26 MED ORDER — CLONAZEPAM 0.5 MG PO TABS: 0.5000 mg | ORAL_TABLET | Freq: Every day | ORAL | 0 refills | Status: DC

## 2022-11-26 MED ORDER — ZALEPLON 5 MG PO CAPS: 5.0000 mg | ORAL_CAPSULE | Freq: Every day | ORAL | 0 refills | Status: DC

## 2022-11-28 ENCOUNTER — Ambulatory Visit (INDEPENDENT_AMBULATORY_CARE_PROVIDER_SITE_OTHER): Payer: 59 | Admitting: Obstetrics and Gynecology

## 2022-11-28 ENCOUNTER — Ambulatory Visit (INDEPENDENT_AMBULATORY_CARE_PROVIDER_SITE_OTHER): Payer: 59 | Admitting: Clinical

## 2022-11-28 ENCOUNTER — Encounter: Payer: Self-pay | Admitting: Obstetrics and Gynecology

## 2022-11-28 VITALS — BP 130/85 | HR 85 | Wt 210.0 lb

## 2022-11-28 DIAGNOSIS — F411 Generalized anxiety disorder: Secondary | ICD-10-CM | POA: Diagnosis not present

## 2022-11-28 DIAGNOSIS — F331 Major depressive disorder, recurrent, moderate: Secondary | ICD-10-CM | POA: Diagnosis not present

## 2022-11-28 DIAGNOSIS — F902 Attention-deficit hyperactivity disorder, combined type: Secondary | ICD-10-CM | POA: Diagnosis not present

## 2022-11-28 DIAGNOSIS — N939 Abnormal uterine and vaginal bleeding, unspecified: Secondary | ICD-10-CM

## 2022-11-28 NOTE — Progress Notes (Signed)
Time: 9:00 am-10:00am CPT Code: 63149F (IN PERSON) Diagnosis Code: F90.2, F41.1, F33.1  Hildreth was seen in person for therapy. She denied having engaged in self-harm since her last session and continues to use the rubberband technique to manage the urge to self harm. She reflected upon several psychology books she has been reading and insights they have triggered into her own experience, which the therapist processed this with her. She also shared a conflict that had arisen in her marriage, and therapist encouraged the idea of couple's therapy. She started lamigdal roughly one month prior, and reported feeling good on it so far. She is scheduled to be seen again in one week.  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 She expresses herself well in writing.  Client Treatment Preferences  Lallie prefers in-person sessions, during school hours  Client Statement of Needs  Sheniqua shared that she is seeking parenting skills, as well as healthier coping skills.  Treatment Level  Weekly  Symptoms  Anxiety : restlessness, difficulty relaxing, racing thoughts, difficulty concentrating, difficulty with creating and adhering to plans and routines (Status: maintained). Depression: tearfulness (Status: maintained).  Problems Addressed  New Description, New Description  Goals 1. [name] experiences difficulty with executive functioning Objective Sidnee will develop and implement organizational strategies Target Date: 2023-03-27 Frequency: Weekly  Progress: 0 Modality: individual  Related Interventions Therapist will help Phillip to consider organizational strategies in her day-to-day life, incorporating manualized treatments such as [name] Therapist will provide opportunities to process parenting experiences in session and incorporate strategies from manualized parent training programs 2. Zahriah frequently experiences  feelings of overwhelm that are difficult to cope with Objective Tiffney would like to develop coping strategies and reduce the frequency of meltdowns  Target Date: 2023-03-27 Frequency: Weekly  Progress: 0 Modality: individual  Related Interventions Haelie will be provided opportunities to process experiences in sessions Therapist will provide referrals for additional resources as appropriate  Therapist will help Yalonda to identify and disengage from maladaptive thoughts and behaviors using CBT-based strategies Therapist will provide emotion regulation strategies including mindfulness, relaxation, and self care techniques Diagnosis Axis none 300.02 (Generalized anxiety disorder) - Open - [Signifier: n/a]    Axis none 314.01 (Attention deficit disorder with hyperactivity) - Open - [Signifier: n/a]    Conditions For Discharge Achievement of treatment goals and objectives    [name], PhD               [name], PhD

## 2022-11-28 NOTE — Progress Notes (Signed)
GYNECOLOGY  VISIT   HPI: 46 y.o.   Married White or Caucasian Not Hispanic or Latino  female   [id number] with Patient's last menstrual period was 11/25/2022.   here for follow up on abnormal bleeding. The patient was seen in 9/23 with c/o AUB. U/s showed possible adenomyosis. She had had her paragard IUD removed in 8/23.  08/29/22 endometrial biopsy with benign inactive endometrium with IUD effect. 08/29/22 Pap was normal, neg HPV.  She was started on OCP's, but developed itching with the pill and stopped it.   Since 9/23, cycles have been ~q25 days, 4-9 days (only one cycle was 9 days, soon after stopping OCP's). At most she has saturated a super tampon in 2-3 hours (down from 1 hour), less clots. Less cramping.   Not currently sexually active.   GYNECOLOGIC HISTORY: Patient's last menstrual period was 11/25/2022. Contraception: abstinence  Menopausal hormone therapy: none         OB History     Gravida  1   Para  1   Term  1   Preterm      AB      Living  1      SAB      IAB      Ectopic      Multiple      Live Births  1        Obstetric Comments  1st Menstrual Cycle:  11 1st Pregnancy:  36            Patient Active Problem List   Diagnosis Date Noted   Encounter for screening colonoscopy    Polyp of sigmoid colon    DUB (dysfunctional uterine bleeding) 07/21/2022   Acute cystitis without hematuria 07/11/2022   Low iron 01/29/2022   Vitamin D deficiency 05/01/2020   Low back pain without sciatica 04/16/2016   PVC's (premature ventricular contractions) 08/22/2015   Chronic insomnia 07/07/2014   Eczema, dyshidrotic 07/07/2014   Generalized anxiety disorder 04/26/2014   History of hepatic disease 01/21/2014   Class 2 severe obesity due to excess calories with serious comorbidity and body mass index (BMI) of 35.0 to 35.9 in adult ([place]) 10/19/2013   Depression, major, recurrent, 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   High triglycerides 05/21/2011   Prediabetes  05/21/2011   Essential hypertension, benign 02/05/2011   PALPITATIONS, RECURRENT 10/31/2008    Past Medical History:  Diagnosis Date   Acne    ADD (attention deficit disorder)    Anemia    H/O   Anxiety    B12 deficiency    Back pain    Bilateral swelling of feet and ankles    Chronic SI joint pain    Constipation    Depression    Edema of both lower extremities    Family history of adverse reaction to anesthesia    Gallbladder problem    GERD (gastroesophageal reflux disease)    High triglycerides    Hyperlipidemia    Hypertension    Insomnia    Joint pain    Neuromuscular disorder ([place])    SI Joint inflamation   Other fatigue    Palpitations    PCOS (polycystic ovarian syndrome)    Pre-diabetes    Shortness of breath on exertion    SI (sacroiliac) joint dysfunction    Vitamin D deficiency     Past Surgical History:  Procedure Laterality Date   APPENDECTOMY     2005    CESAREAN SECTION  11/17/2012  CHOLECYSTECTOMY  03/31/2014   COLONOSCOPY WITH PROPOFOL N/A 09/06/2022   Procedure: COLONOSCOPY WITH PROPOFOL;  Surgeon: Lin Landsman, MD;  Location[facility] ENDOSCOPY;  Service: Gastroenterology;  Laterality: N/A;   DILATION AND CURETTAGE OF UTERUS  2010, 07/2010   Polyps   HYSTEROSALPINGOGRAM  07/23/2010   [facility] ([facility])   TONSILLECTOMY N/A 04/09/2017   Procedure: TONSILLECTOMY;  Surgeon: Clyde Canterbury, MD;  Location: [facility] ORS;  Service: ENT;  Laterality: N/A;    Current Outpatient Medications  Medication Sig Dispense Refill   amphetamine-dextroamphetamine (ADDERALL) 10 MG tablet Take 10 mg by mouth 2 (two) times daily.     ascorbic acid (VITAMIN C) 500 MG tablet Take 500 mg by mouth at bedtime.     clonazePAM (KLONOPIN) 0.5 MG tablet Take 1 tablet (0.5 mg total) by mouth at bedtime. 30 tablet 0   escitalopram (LEXAPRO) 10 MG tablet Take 10 mg by mouth daily.     lamoTRIgine (LAMICTAL) 25 MG tablet Take 50 mg by mouth daily.     LINZESS 72 MCG  capsule Take 72 mcg by mouth every morning.     MAGNESIUM PO Take 1 tablet by mouth in the morning and at bedtime.     meloxicam (MOBIC) 15 MG tablet Take 15 mg by mouth daily.     Omega-3 Fatty Acids (FISH OIL PO) Take 2,200 mg by mouth daily.     spironolactone (ALDACTONE) 50 MG tablet Take 50 mg by mouth 2 (two) times daily.     tretinoin (RETIN-A) 0.025 % cream as needed.     Vitamin D, Cholecalciferol, 25 MCG (1000 UT) TABS Take 2 tablets by mouth daily.     VYVANSE 50 MG capsule Take 50 mg by mouth every morning.     zaleplon (SONATA) 5 MG capsule Take 1 capsule (5 mg total) by mouth at bedtime. 30 capsule 0   No current facility-administered medications for this visit.     ALLERGIES: Sulfa antibiotics  Family History  Problem Relation Age of Onset   Anxiety disorder Mother    Hypertension Mother    Cancer Mother 83       Non-sm cell neuro endocrine tumors   Alcoholism Father    Anxiety disorder Father    Aneurysm Father    Breast cancer Paternal Aunt 38   Anxiety disorder Other    Alcoholism Other     Social History   Socioeconomic History   Marital status: Married    Spouse name: Elta [place]   Number of children: 1   Years of education: Not on file   Highest education level: Not on file  Occupational History   Occupation: Product/process development scientist: [employer]  Tobacco Use   Smoking status: Former    Packs/day: 1.00    Years: 10.00    Total pack years: 10.00    Types: Cigarettes    Quit date: 02/08/2015    Years since quitting: 7.8   Smokeless tobacco: Never  Vaping Use   Vaping Use: Never used  Substance and Sexual Activity   Alcohol use: No    Alcohol/week: 0.0 standard drinks of alcohol   Drug use: No   Sexual activity: Not Currently    Partners: Male    Birth control/protection: Abstinence  Other Topics Concern   Not on file  Social History Narrative   Not on file   Social Determinants of Health   Financial Resource Strain: Not on file   Food Insecurity: Not on  file  Transportation Needs: Not on file  Physical Activity: Not on file  Stress: Not on file  Social Connections: Not on file  Intimate Partner Violence: Not on file    Review of Systems  All other systems reviewed and are negative.   PHYSICAL EXAMINATION:    LMP 11/25/2022     General appearance: alert, cooperative and appears stated age  9. Abnormal uterine bleeding Cycles are improving since her paragard was removed. She is only had one long cycle (lasted 9 days) in the last 3 months.  -she will continue to calendar her cycles, we reviewed the parameters for which she should reach out -F/U for an annual exam in 9/24

## 2022-12-04 ENCOUNTER — Ambulatory Visit (INDEPENDENT_AMBULATORY_CARE_PROVIDER_SITE_OTHER): Payer: 59 | Admitting: Clinical

## 2022-12-04 DIAGNOSIS — F331 Major depressive disorder, recurrent, moderate: Secondary | ICD-10-CM

## 2022-12-04 DIAGNOSIS — F902 Attention-deficit hyperactivity disorder, combined type: Secondary | ICD-10-CM

## 2022-12-04 DIAGNOSIS — F411 Generalized anxiety disorder: Secondary | ICD-10-CM

## 2022-12-04 NOTE — Progress Notes (Signed)
Time: 9:00 am-10:00am CPT Code: 89211H (IN PERSON) Diagnosis Code: F90.2, F41.1, F33.1  Andrea Colon was seen in person for therapy. She denied having engaged in self-harm since her last session and continues to use the rubberband technique to manage the urge to self harm. Session focused on dynamics in her family. Therapist provided referrals for several couples' counselors, and worked with her to consider how to broach a conversation with her husband. She is scheduled to be seen again in three weeks.  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 She expresses herself well in writing.  Client Treatment Preferences  Joelly prefers in-person sessions, during school hours  Client Statement of Needs  Renesmee shared that she is seeking parenting skills, as well as healthier coping skills.  Treatment Level  Weekly  Symptoms  Anxiety : restlessness, difficulty relaxing, racing thoughts, difficulty concentrating, difficulty with creating and adhering to plans and routines (Status: maintained). Depression: tearfulness (Status: maintained).  Problems Addressed  New Description, New Description  Goals 1. [name] experiences difficulty with executive functioning Objective Bentlee will develop and implement organizational strategies Target Date: 2023-03-27 Frequency: Weekly  Progress: 0 Modality: individual  Related Interventions Therapist will help Orie to consider organizational strategies in her day-to-day life, incorporating manualized treatments such as [name] Therapist will provide opportunities to process parenting experiences in session and incorporate strategies from manualized parent training programs 2. Jacqui frequently experiences feelings of overwhelm that are difficult to cope with Objective Meriel would like to develop coping strategies and reduce the frequency of meltdowns  Target Date: 2023-03-27  Frequency: Weekly  Progress: 0 Modality: individual  Related Interventions Kayliee will be provided opportunities to process experiences in sessions Therapist will provide referrals for additional resources as appropriate  Therapist will help Talulah to identify and disengage from maladaptive thoughts and behaviors using CBT-based strategies Therapist will provide emotion regulation strategies including mindfulness, relaxation, and self care techniques Diagnosis Axis none 300.02 (Generalized anxiety disorder) - Open - [Signifier: n/a]    Axis none 314.01 (Attention deficit disorder with hyperactivity) - Open - [Signifier: n/a]    Conditions For Discharge Achievement of treatment goals and objectives          [name], PhD               [name], PhD

## 2022-12-07 ENCOUNTER — Telehealth: Payer: 59 | Admitting: Nurse Practitioner

## 2022-12-07 DIAGNOSIS — J00 Acute nasopharyngitis [common cold]: Secondary | ICD-10-CM

## 2022-12-07 MED ORDER — BENZONATATE 100 MG PO CAPS: 100.0000 mg | ORAL_CAPSULE | Freq: Three times a day (TID) | ORAL | 0 refills | Status: DC | PRN

## 2022-12-07 MED ORDER — FLUTICASONE PROPIONATE 50 MCG/ACT NA SUSP: 2.0000 | Freq: Every day | NASAL | 6 refills | Status: DC

## 2022-12-07 NOTE — Progress Notes (Signed)

## 2022-12-20 ENCOUNTER — Encounter: Payer: Self-pay | Admitting: Family Medicine

## 2022-12-24 ENCOUNTER — Other Ambulatory Visit: Payer: Self-pay | Admitting: Family Medicine

## 2022-12-24 MED ORDER — ZALEPLON 5 MG PO CAPS: 5.0000 mg | ORAL_CAPSULE | Freq: Every day | ORAL | 0 refills | Status: DC

## 2022-12-24 MED ORDER — CLONAZEPAM 0.5 MG PO TABS: 0.5000 mg | ORAL_TABLET | Freq: Every day | ORAL | 0 refills | Status: DC

## 2022-12-25 ENCOUNTER — Encounter: Payer: Self-pay | Admitting: Obstetrics and Gynecology

## 2022-12-26 ENCOUNTER — Ambulatory Visit (INDEPENDENT_AMBULATORY_CARE_PROVIDER_SITE_OTHER): Payer: 59 | Admitting: Clinical

## 2022-12-26 DIAGNOSIS — F331 Major depressive disorder, recurrent, moderate: Secondary | ICD-10-CM | POA: Diagnosis not present

## 2022-12-26 DIAGNOSIS — F902 Attention-deficit hyperactivity disorder, combined type: Secondary | ICD-10-CM | POA: Diagnosis not present

## 2022-12-26 DIAGNOSIS — F411 Generalized anxiety disorder: Secondary | ICD-10-CM | POA: Diagnosis not present

## 2022-12-26 NOTE — Progress Notes (Signed)
Time: 9:00 am-10:00am CPT Code: 62831D (IN PERSON) Diagnosis Code: F90.2, F41.1, F33.1  Andrea Colon was seen in person for therapy. She had engaged in self-harm since her last session, and showed the therapist a series of shallow cuts on her upper thigh that appeared recent. Suicidal ideation and intent were denied. Therapist processed this with her, strongly encouraging her to explore additional trauma therapy. For homework, she will consider which modality may suit her best. She is scheduled to be seen again in one week.  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 She expresses herself well in writing.  Client Treatment Preferences  Andrea Colon prefers in-person sessions, during school hours  Client Statement of Needs  Andrea Colon shared that she is seeking parenting skills, as well as healthier coping skills.  Treatment Level  Weekly  Symptoms  Anxiety : restlessness, difficulty relaxing, racing thoughts, difficulty concentrating, difficulty with creating and adhering to plans and routines (Status: maintained). Depression: tearfulness (Status: maintained).  Problems Addressed  New Description, New Description  Goals 1. Andrea Colon experiences difficulty with executive functioning Objective Andrea Colon will develop and implement organizational strategies Target Date: 2023-03-27 Frequency: Weekly  Progress: 0 Modality: individual  Related Interventions Therapist will help [name] to consider organizational strategies in her day-to-day life, incorporating manualized treatments such as [name] Therapist will provide opportunities to process parenting experiences in session and incorporate strategies from manualized parent training programs 2. Andrea Colon frequently experiences feelings of overwhelm that are difficult to cope with Objective Andrea Colon would like to develop coping strategies and reduce the frequency of meltdowns  Target  Date: 2023-03-27 Frequency: Weekly  Progress: 0 Modality: individual  Related Interventions Andrea Colon will be provided opportunities to process experiences in sessions Therapist will provide referrals for additional resources as appropriate  Therapist will help [name] to identify and disengage from maladaptive thoughts and behaviors using CBT-based strategies Therapist will provide emotion regulation strategies including mindfulness, relaxation, and self care techniques Diagnosis Axis none 300.02 (Generalized anxiety disorder) - Open - [Signifier: n/a]    Axis none 314.01 (Attention deficit disorder with hyperactivity) - Open - [Signifier: n/a]    Conditions For Discharge Achievement of treatment goals and objectives                [name], PhD               [name], PhD

## 2023-01-01 ENCOUNTER — Ambulatory Visit (INDEPENDENT_AMBULATORY_CARE_PROVIDER_SITE_OTHER): Payer: 59 | Admitting: Clinical

## 2023-01-01 DIAGNOSIS — F902 Attention-deficit hyperactivity disorder, combined type: Secondary | ICD-10-CM | POA: Diagnosis not present

## 2023-01-01 DIAGNOSIS — F411 Generalized anxiety disorder: Secondary | ICD-10-CM

## 2023-01-01 DIAGNOSIS — F331 Major depressive disorder, recurrent, moderate: Secondary | ICD-10-CM

## 2023-01-01 NOTE — Progress Notes (Signed)
Time: 1:00 pm-2:00 pm CPT Code: 34742V (IN PERSON) Diagnosis Code: F90.2, F41.1, F33.1  Andrea Colon was seen in person for therapy. She shared that she had not engaged in self harm in the past week. Session focused on processing an exchange that had taken place in the last few days during which [facility] had sent an email to the therapist that was longer than the therapist was able to read an process prior to session. Therapist engaged her in a discussion of boundaries around email, and suggested a boundary of writing what she plans to write in an email in her notebook, to discuss in session. She affirmed that she will go to the emergency room or call 911 in a true emergency. She reflected upon feelings of rejection that had arisen when the therapist had suggested seeking additional trauma therapy, and shared more about trauma she had experienced while growing up. She shared a manual that she is interested in working through in session (the Complex PTSD Workbook by Erling Conte) and therapist expressed openness to the idea but again clarified the benefit of seeing a therapist with specific trauma training. Andrea Colon is scheduled to be seen again in one week.  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 She expresses herself well in writing.  Client Treatment Preferences  Andrea Colon prefers in-person sessions, during school hours  Client Statement of Needs  Andrea Colon shared that she is seeking parenting skills, as well as healthier coping skills.  Treatment Level  Weekly  Symptoms  Anxiety : restlessness, difficulty relaxing, racing thoughts, difficulty concentrating, difficulty with creating and adhering to plans and routines (Status: maintained). Depression: tearfulness (Status: maintained).  Problems Addressed  New Description, New Description  Goals 1. Andrea Colon experiences difficulty with executive functioning Objective Andrea Colon will develop  and implement organizational strategies Target Date: 2023-03-27 Frequency: Weekly  Progress: 0 Modality: individual  Related Interventions Therapist will help Andrea Colon to consider organizational strategies in her day-to-day life, incorporating manualized treatments such as [name] Therapist will provide opportunities to process parenting experiences in session and incorporate strategies from manualized parent training programs 2. Andrea Colon frequently experiences feelings of overwhelm that are difficult to cope with Objective Andrea Colon would like to develop coping strategies and reduce the frequency of meltdowns  Target Date: 2023-03-27 Frequency: Weekly  Progress: 0 Modality: individual  Related Interventions Andrea Colon will be provided opportunities to process experiences in sessions Therapist will provide referrals for additional resources as appropriate  Therapist will help Andrea Colon to identify and disengage from maladaptive thoughts and behaviors using CBT-based strategies Therapist will provide emotion regulation strategies including mindfulness, relaxation, and self care techniques Diagnosis Axis none 300.02 (Generalized anxiety disorder) - Open - [Signifier: n/a]    Axis none 314.01 (Attention deficit disorder with hyperactivity) - Open - [Signifier: n/a]    Conditions For Discharge Achievement of treatment goals and objectives    [name], PhD               [name], PhD

## 2023-01-09 ENCOUNTER — Ambulatory Visit (INDEPENDENT_AMBULATORY_CARE_PROVIDER_SITE_OTHER): Payer: 59 | Admitting: Clinical

## 2023-01-09 DIAGNOSIS — F902 Attention-deficit hyperactivity disorder, combined type: Secondary | ICD-10-CM

## 2023-01-09 DIAGNOSIS — F331 Major depressive disorder, recurrent, moderate: Secondary | ICD-10-CM

## 2023-01-09 DIAGNOSIS — F411 Generalized anxiety disorder: Secondary | ICD-10-CM

## 2023-01-09 NOTE — Progress Notes (Signed)
Time: 9:00 am-10:00 am CPT Code: 07622Q (IN PERSON) Diagnosis Code: F90.2, F41.1, F33.1  Andrea Colon was seen in person for therapy. She shared that she had not engaged in self harm in the past week. She had further researched trauma therapy, and therapist provided an additional referral for a TF-CBT therapist, whom she agreed to contact for homework. [name] expressed a desire to share her trauma narrative, and therapist engaged her in a grounding and relaxation exercise prior to reading her written account of childhood trauma. Therapist also worked with her to create a plan for self-care following the session. Suicidal ideation and intent were denied. She is scheduled to be seen again in one week.  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 She expresses herself well in writing.  Client Treatment Preferences  [name] prefers in-person sessions, during school hours  Client Statement of Needs  [name] shared that she is seeking parenting skills, as well as healthier coping skills.  Treatment Level  Weekly  Symptoms  Anxiety : restlessness, difficulty relaxing, racing thoughts, difficulty concentrating, difficulty with creating and adhering to plans and routines (Status: maintained). Depression: tearfulness (Status: maintained).  Problems Addressed  New Description, New Description  Goals 1. [name] experiences difficulty with executive functioning Objective [name] will develop and implement organizational strategies Target Date: 2023-03-27 Frequency: Weekly  Progress: 0 Modality: individual  Related Interventions Therapist will help [name] to consider organizational strategies in her day-to-day life, incorporating manualized treatments such as [name] Therapist will provide opportunities to process parenting experiences in session and incorporate strategies from manualized parent training programs 2. [name]  frequently experiences feelings of overwhelm that are difficult to cope with Objective [name] would like to develop coping strategies and reduce the frequency of meltdowns  Target Date: 2023-03-27 Frequency: Weekly  Progress: 0 Modality: individual  Related Interventions [name] will be provided opportunities to process experiences in sessions Therapist will provide referrals for additional resources as appropriate  Therapist will help [name] to identify and disengage from maladaptive thoughts and behaviors using CBT-based strategies Therapist will provide emotion regulation strategies including mindfulness, relaxation, and self care techniques Diagnosis Axis none 300.02 (Generalized anxiety disorder) - Open - [Signifier: n/a]    Axis none 314.01 (Attention deficit disorder with hyperactivity) - Open - [Signifier: n/a]    Conditions For Discharge Achievement of treatment goals and objectives     [name], PhD               [name], PhD

## 2023-01-16 ENCOUNTER — Ambulatory Visit (INDEPENDENT_AMBULATORY_CARE_PROVIDER_SITE_OTHER): Payer: 59 | Admitting: Clinical

## 2023-01-16 DIAGNOSIS — F331 Major depressive disorder, recurrent, moderate: Secondary | ICD-10-CM | POA: Diagnosis not present

## 2023-01-16 DIAGNOSIS — F411 Generalized anxiety disorder: Secondary | ICD-10-CM

## 2023-01-16 DIAGNOSIS — F902 Attention-deficit hyperactivity disorder, combined type: Secondary | ICD-10-CM

## 2023-01-16 NOTE — Progress Notes (Signed)
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 She shared that she had not engaged in self harm in the past week. Session began by discussing her joke at the end of her previous session about walking in front of a bus. Therapist set a clear boundary of no jokes about self-harm. Therapist also set a boundary of no more emails between sessions, pointing out that Amey is likely using emails as part of seeking assurance from the therapist due to fears of abandonment, and the compulsive emailing may in fact may the anxiety worse. Therapist also provided a clear description of what discontinuation might look like, letting Ronan know that she would most likely have 30 days to find a new therapist and would be informed in advance. She processed her feelings about her previous session, and had scheduled an appointment with a trauma therapist, with plans to contact an additional one. Suicidal ideation and intent were denied. She is scheduled to be seen again in one week.  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 She expresses herself well in writing.  Client Treatment Preferences  [name] prefers in-person sessions, during school hours  Client Statement of Needs  [name] shared that she is seeking parenting skills, as well as healthier coping skills.  Treatment Level  Weekly  Symptoms  Anxiety : restlessness, difficulty relaxing, racing thoughts, difficulty concentrating, difficulty with creating and adhering to plans and routines (Status: maintained). Depression: tearfulness (Status: maintained).  Problems Addressed  New Description, New Description  Goals 1. [name] experiences difficulty with executive functioning Objective [name] will develop and implement organizational strategies Target Date: 2023-03-27 Frequency: Weekly  Progress: 0 Modality:  individual  Related Interventions Therapist will help [name] to consider organizational strategies in her day-to-day life, incorporating manualized treatments such as [name] Therapist will provide opportunities to process parenting experiences in session and incorporate strategies from manualized parent training programs 2. [name] frequently experiences feelings of overwhelm that are difficult to cope with Objective [name] would like to develop coping strategies and reduce the frequency of meltdowns  Target Date: 2023-03-27 Frequency: Weekly  Progress: 0 Modality: individual  Related Interventions [name] will be provided opportunities to process experiences in sessions Therapist will provide referrals for additional resources as appropriate  Therapist will help [name] to identify and disengage from maladaptive thoughts and behaviors using CBT-based strategies Therapist will provide emotion regulation strategies including mindfulness, relaxation, and self care techniques Diagnosis Axis none 300.02 (Generalized anxiety disorder) - Open - [Signifier: n/a]    Axis none 314.01 (Attention deficit disorder with hyperactivity) - Open - [Signifier: n/a]    Conditions For Discharge Achievement of treatment goals and objectives     [name], PhD               [name], PhD               [name], PhD

## 2023-01-17 ENCOUNTER — Encounter: Payer: Self-pay | Admitting: Family Medicine

## 2023-01-17 ENCOUNTER — Ambulatory Visit (INDEPENDENT_AMBULATORY_CARE_PROVIDER_SITE_OTHER): Payer: 59 | Admitting: Family Medicine

## 2023-01-17 VITALS — BP 110/74 | HR 81 | Temp 97.6°F | Ht 64.0 in | Wt 214.0 lb

## 2023-01-17 DIAGNOSIS — N6322 Unspecified lump in the left breast, upper inner quadrant: Secondary | ICD-10-CM | POA: Diagnosis not present

## 2023-01-17 NOTE — Progress Notes (Signed)
Patient ID: Andrea Colon, female    DOB: 08/25/1976, 47 y.o.   MRN: [id number]  This visit was conducted in person.  BP 110/74   Pulse 81   Temp 97.6 F (36.4 C) (Oral)   Ht '5\' 4"'$  (1.626 m)   Wt 214 lb (97.1 kg)   LMP 01/16/2023   SpO2 96%   BMI 36.73 kg/m    CC:  Chief Complaint  Patient presents with   Breast Mass    Left    Subjective:   HPI: Andrea Colon is a 47 y.o. female presenting on 01/17/2023 for Breast Mass (Left)  She has noted a mass in her left breast in the last 24 hours... noted at [place] while lotioning. No pain, no redness, no nipple change, no nipple discharge.   Menses started yesterday.  No change in caffeine.   She reports a history of fibrocystic breast disease.  Reviewed last mammogram from July 16, 2022: No mammographic evidence of malignancy.     Relevant past medical, surgical, family and social history reviewed and updated as indicated. Interim medical history since our last visit reviewed. Allergies and medications reviewed and updated. Outpatient Medications Prior to Visit  Medication Sig Dispense Refill   amphetamine-dextroamphetamine (ADDERALL XR) 20 MG 24 hr capsule Take 20 mg by mouth daily.     Bioflavonoid Products (ESTER-C PO) Take 1,000 mg by mouth daily.     clonazePAM (KLONOPIN) 0.5 MG tablet Take 1 tablet (0.5 mg total) by mouth at bedtime. 30 tablet 0   escitalopram (LEXAPRO) 10 MG tablet Take 10 mg by mouth daily.     lamoTRIgine (LAMICTAL) 25 MG tablet Take 75 mg by mouth daily.     LINZESS 290 MCG CAPS capsule Take 290 mcg by mouth every morning.     MAGNESIUM PO Take 1 tablet by mouth in the morning and at bedtime.     meloxicam (MOBIC) 15 MG tablet Take 15 mg by mouth daily.     Omega-3 Fatty Acids (FISH OIL PO) Take 2,200 mg by mouth daily.     spironolactone (ALDACTONE) 50 MG tablet Take 50 mg by mouth 2 (two) times daily.     tretinoin (RETIN-A) 0.025 % cream as needed.     Vitamin D,  Cholecalciferol, 25 MCG (1000 UT) TABS Take 4 tablets by mouth daily.     VYVANSE 50 MG capsule Take 50 mg by mouth every morning.     zaleplon (SONATA) 5 MG capsule Take 1 capsule (5 mg total) by mouth at bedtime. 30 capsule 0   amphetamine-dextroamphetamine (ADDERALL) 10 MG tablet Take 10 mg by mouth 2 (two) times daily.     ascorbic acid (VITAMIN C) 500 MG tablet Take 500 mg by mouth at bedtime.     benzonatate (TESSALON PERLES) 100 MG capsule Take 1 capsule (100 mg total) by mouth 3 (three) times daily as needed. 20 capsule 0   fluticasone (FLONASE) 50 MCG/ACT nasal spray Place 2 sprays into both nostrils daily. 16 g 6   LINZESS 72 MCG capsule Take 72 mcg by mouth every morning.     No facility-administered medications prior to visit.     Per HPI unless specifically indicated in ROS section below Review of Systems  Constitutional:  Negative for fatigue and fever.  HENT:  Negative for congestion.   Eyes:  Negative for pain.  Respiratory:  Negative for cough and shortness of breath.   Cardiovascular:  Negative for  chest pain, palpitations and leg swelling.  Gastrointestinal:  Negative for abdominal pain.  Genitourinary:  Negative for dysuria and vaginal bleeding.  Musculoskeletal:  Negative for back pain.  Neurological:  Negative for syncope, light-headedness and headaches.  Psychiatric/Behavioral:  Negative for dysphoric mood.    Objective:  BP 110/74   Pulse 81   Temp 97.6 F (36.4 C) (Oral)   Ht '5\' 4"'$  (1.626 m)   Wt 214 lb (97.1 kg)   LMP 01/16/2023   SpO2 96%   BMI 36.73 kg/m   Wt Readings from Last 3 Encounters:  01/17/23 214 lb (97.1 kg)  11/28/22 210 lb (95.3 kg)  09/20/22 205 lb 6 oz (93.2 kg)      Physical Exam Constitutional:      General: She is not in acute distress.    Appearance: Normal appearance. She is well-developed. She is not ill-appearing or toxic-appearing.  HENT:     Head: Normocephalic.     Right Ear: Hearing, tympanic membrane, ear canal and  external ear normal. Tympanic membrane is not erythematous, retracted or bulging.     Left Ear: Hearing, tympanic membrane, ear canal and external ear normal. Tympanic membrane is not erythematous, retracted or bulging.     Nose: No mucosal edema or rhinorrhea.     Right Sinus: No maxillary sinus tenderness or frontal sinus tenderness.     Left Sinus: No maxillary sinus tenderness or frontal sinus tenderness.     Mouth/Throat:     Pharynx: Uvula midline.  Eyes:     General: Lids are normal. Lids are everted, no foreign bodies appreciated.     Conjunctiva/sclera: Conjunctivae normal.     Pupils: Pupils are equal, round, and reactive to light.  Neck:     Thyroid: No thyroid mass or thyromegaly.     Vascular: No carotid bruit.     Trachea: Trachea normal.  Cardiovascular:     Rate and Rhythm: Normal rate and regular rhythm.     Pulses: Normal pulses.     Heart sounds: Normal heart sounds, S1 normal and S2 normal. No murmur heard.    No friction rub. No gallop.  Pulmonary:     Effort: Pulmonary effort is normal. No tachypnea or respiratory distress.     Breath sounds: Normal breath sounds. No decreased breath sounds, wheezing, rhonchi or rales.  Chest:  Breasts:    Breasts are symmetrical.     Right: Normal. No inverted nipple, mass, nipple discharge, skin change or tenderness.     Left: Mass present. No swelling, bleeding, inverted nipple, nipple discharge, skin change or tenderness.    Abdominal:     General: Bowel sounds are normal.     Palpations: Abdomen is soft.     Tenderness: There is no abdominal tenderness.  Musculoskeletal:     Cervical back: Normal range of motion and neck supple.  Lymphadenopathy:     Upper Body:     Right upper body: No supraclavicular or axillary adenopathy.     Left upper body: No supraclavicular or axillary adenopathy.  Skin:    General: Skin is warm and dry.     Findings: No rash.  Neurological:     Mental Status: She is alert.  Psychiatric:         Mood and Affect: Mood is not anxious or depressed.        Speech: Speech normal.        Behavior: Behavior normal. Behavior is cooperative.  Thought Content: Thought content normal.        Judgment: Judgment normal.       Results for orders placed or performed during the hospital encounter of 09/06/22  Pregnancy, urine POC  Result Value Ref Range   Preg Test, Ur NEGATIVE NEGATIVE  Surgical pathology  Result Value Ref Range   SURGICAL PATHOLOGY      SURGICAL PATHOLOGY CASE: [id number] PATIENT: Henri Medal Surgical Pathology Report     Specimen Submitted: A. Colon polyp, sigmoid; cold snare  Clinical History: Screening colonoscopy.  Colon polyp, external hemorrhoids    DIAGNOSIS: A. COLON POLYP, SIGMOID; COLD SNARE: - SESSILE SERRATED POLYP. - NEGATIVE FOR DYSPLASIA AND MALIGNANCY.  GROSS DESCRIPTION: A. Labeled: Sigmoid colon polyp cold snare Received: Formalin Collection time: 8:32 AM on 09/06/2022 Placed into formalin time: 8:32 AM on 09/06/2022 Tissue fragment(s): 2 Size: Range from 1-1.6 cm Description: Received are 2 fragments of tan-white translucent soft tissue.  The resection margins are differentially inked and the fragments are serially sectioned. Entirely submitted in cassettes 1-2 with 1 serially sectioned fragment per cassette.  RB 09/06/2022  Final Diagnosis performed by Allena Napoleon, MD.   Electronically signed 09/09/2022 10:45:40AM The electronic signature indicates that the  named Attending Pathologist has evaluated the specimen Technical component performed at [facility], [address] [place], [place] [address] Lab: [phone] Dir: Rush Farmer, MD, MMM  Professional component performed at [facility], [place], [place], [place] [address] Lab: [phone] Dir: Kathi Simpers, MD     Assessment and Plan  Mass of upper inner quadrant of left breast Assessment & Plan: Acute, given she  is on her menstrual cycle currently lesion in left breast likely secondary to fibrocystic breast disease, cyst versus adenoma.  Will send for ultrasound and mammogram evaluation.  Return and ER precautions provided.  Orders: -     US BREAST LTD UNI RIGHT INC AXILLA; Future -     US BREAST LTD UNI LEFT INC AXILLA; Future -     MM DIAG BREAST TOMO BILATERAL; Future    No follow-ups on file.   Eliezer Lofts, MD

## 2023-01-17 NOTE — Assessment & Plan Note (Signed)
Acute, given she is on her menstrual cycle currently lesion in left breast likely secondary to fibrocystic breast disease, cyst versus adenoma.  Will send for ultrasound and mammogram evaluation.  Return and ER precautions provided.

## 2023-01-19 ENCOUNTER — Other Ambulatory Visit: Payer: Self-pay | Admitting: Family Medicine

## 2023-01-20 MED ORDER — CLONAZEPAM 0.5 MG PO TABS: 0.5000 mg | ORAL_TABLET | Freq: Every day | ORAL | 0 refills | Status: DC

## 2023-01-20 MED ORDER — ZALEPLON 5 MG PO CAPS: 5.0000 mg | ORAL_CAPSULE | Freq: Every day | ORAL | 0 refills | Status: DC

## 2023-01-20 NOTE — Telephone Encounter (Signed)
Last office visit 01/17/23 for breast mass.  Last refilled clonazepam 12/24/2022 for #30 with no refills. Zaleplon 12/24/22 for #30 with no refills.  CPE scheduled for 06/20/23.

## 2023-01-22 ENCOUNTER — Ambulatory Visit
Admission: RE | Admit: 2023-01-22 | Discharge: 2023-01-22 | Disposition: A | Discharge status: Home / Self Care | Payer: 59 | Source: Ambulatory Visit | Attending: Family Medicine | Admitting: Family Medicine

## 2023-01-22 DIAGNOSIS — N6322 Unspecified lump in the left breast, upper inner quadrant: Secondary | ICD-10-CM | POA: Diagnosis not present

## 2023-01-23 ENCOUNTER — Ambulatory Visit (INDEPENDENT_AMBULATORY_CARE_PROVIDER_SITE_OTHER): Payer: 59 | Admitting: Clinical

## 2023-01-23 DIAGNOSIS — F331 Major depressive disorder, recurrent, moderate: Secondary | ICD-10-CM

## 2023-01-23 DIAGNOSIS — F902 Attention-deficit hyperactivity disorder, combined type: Secondary | ICD-10-CM

## 2023-01-23 DIAGNOSIS — F411 Generalized anxiety disorder: Secondary | ICD-10-CM

## 2023-01-23 NOTE — Progress Notes (Signed)
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 She had engaged in self-harm once in the past week. Sucidal ideation and intent were denied. She connected self harm to feelings of anger at recent developments in her household, and in her relationship with the therapist. Therapist processed this with her, inviting her to express anger. She had completed her homework of meeting with a trauma therapist, and the appointment had gone well, with another scheduled for 2/14. She plans to authorize both therapists to communicate to facilitate coordination of care. She is scheduled to be seen again in one week.  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 She expresses herself well in writing.  Client Treatment Preferences  Andrea Colon prefers in-person sessions, during school hours  Client Statement of Needs  Andrea Colon shared that she is seeking parenting skills, as well as healthier coping skills.  Treatment Level  Weekly  Symptoms  Anxiety : restlessness, difficulty relaxing, racing thoughts, difficulty concentrating, difficulty with creating and adhering to plans and routines (Status: maintained). Depression: tearfulness (Status: maintained).  Problems Addressed  New Description, New Description  Goals 1. Andrea Colon experiences difficulty with executive functioning Objective Andrea Colon will develop and implement organizational strategies Target Date: 2023-03-27 Frequency: Weekly  Progress: 0 Modality: individual  Related Interventions Therapist will help Andrea Colon to consider organizational strategies in her day-to-day life, incorporating manualized treatments such as [name] Therapist will provide opportunities to process parenting experiences in session and incorporate strategies from manualized parent training programs 2. Andrea Colon frequently  experiences feelings of overwhelm that are difficult to cope with Objective Andrea Colon would like to develop coping strategies and reduce the frequency of meltdowns  Target Date: 2023-03-27 Frequency: Weekly  Progress: 0 Modality: individual  Related Interventions Andrea Colon will be provided opportunities to process experiences in sessions Therapist will provide referrals for additional resources as appropriate  Therapist will help Andrea Colon to identify and disengage from maladaptive thoughts and behaviors using CBT-based strategies Therapist will provide emotion regulation strategies including mindfulness, relaxation, and self care techniques Diagnosis Axis none 300.02 (Generalized anxiety disorder) - Open - [Signifier: n/a]    Axis none 314.01 (Attention deficit disorder with hyperactivity) - Open - [Signifier: n/a]    Conditions For Discharge Achievement of treatment goals and objectives     [name], PhD                    [name], PhD               [name], PhD

## 2023-01-27 ENCOUNTER — Other Ambulatory Visit: Payer: Self-pay | Admitting: Family Medicine

## 2023-01-29 ENCOUNTER — Ambulatory Visit (INDEPENDENT_AMBULATORY_CARE_PROVIDER_SITE_OTHER): Payer: 59 | Admitting: Clinical

## 2023-01-29 DIAGNOSIS — F331 Major depressive disorder, recurrent, moderate: Secondary | ICD-10-CM | POA: Diagnosis not present

## 2023-01-29 DIAGNOSIS — F902 Attention-deficit hyperactivity disorder, combined type: Secondary | ICD-10-CM | POA: Diagnosis not present

## 2023-01-29 DIAGNOSIS — F411 Generalized anxiety disorder: Secondary | ICD-10-CM

## 2023-01-29 NOTE — Progress Notes (Signed)
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 She had not engaged in self harm over the past week. Session focused on stressors related to her son. Therapist offered parenting suggestions to discuss with his therapist, as well as worked with her to navigate setting up [place] for her son. She is scheduled to be seen again in one week.  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 She expresses herself well in writing.  Client Treatment Preferences  Andrea Colon prefers in-person sessions, during school hours  Client Statement of Needs  Andrea Colon shared that she is seeking parenting skills, as well as healthier coping skills.  Treatment Level  Weekly  Symptoms  Anxiety : restlessness, difficulty relaxing, racing thoughts, difficulty concentrating, difficulty with creating and adhering to plans and routines (Status: maintained). Depression: tearfulness (Status: maintained).  Problems Addressed  New Description, New Description  Goals 1. Andrea Colon experiences difficulty with executive functioning Objective Andrea Colon will develop and implement organizational strategies Target Date: 2023-03-27 Frequency: Weekly  Progress: 0 Modality: individual  Related Interventions Therapist will help Nigel to consider organizational strategies in her day-to-day life, incorporating manualized treatments such as [name] Therapist will provide opportunities to process parenting experiences in session and incorporate strategies from manualized parent training programs 2. Andrea Colon frequently experiences feelings of overwhelm that are difficult to cope with Objective Andrea Colon would like to develop coping strategies and reduce the frequency of meltdowns  Target Date: 2023-03-27 Frequency: Weekly  Progress: 0 Modality: individual  Related Interventions Andrea Colon will be  provided opportunities to process experiences in sessions Therapist will provide referrals for additional resources as appropriate  Therapist will help Andrea Colon to identify and disengage from maladaptive thoughts and behaviors using CBT-based strategies Therapist will provide emotion regulation strategies including mindfulness, relaxation, and self care techniques Diagnosis Axis none 300.02 (Generalized anxiety disorder) - Open - [Signifier: n/a]    Axis none 314.01 (Attention deficit disorder with hyperactivity) - Open - [Signifier: n/a]    Conditions For Discharge Achievement of treatment goals and objectives     Andrea Cruise, PhD                      Andrea Cruise, PhD               Andrea Cruise, PhD

## 2023-02-03 IMAGING — MG MM DIGITAL SCREENING BILAT W/ TOMO AND CAD
6 of 12 series · 6 of 36 positions shown · non-contrast
Comparison: Previous exam(s).

CLINICAL DATA: Screening.

EXAM:
DIGITAL SCREENING BILATERAL MAMMOGRAM WITH TOMOSYNTHESIS AND CAD
TECHNIQUE: Bilateral screening digital craniocaudal and mediolateral oblique
mammograms were obtained. Bilateral screening digital breast
tomosynthesis was performed. The images were evaluated with
computer-aided detection.

[L MLO synth-2D (1 of 2)]
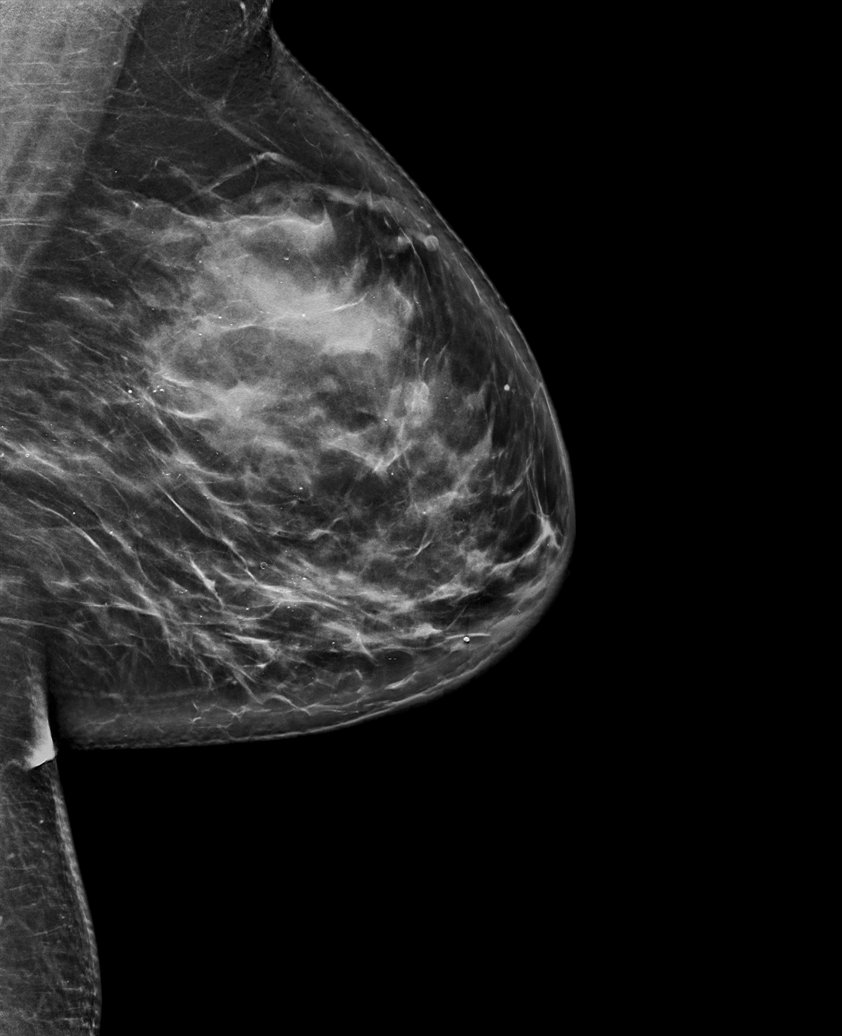

[R CC synth-2D]
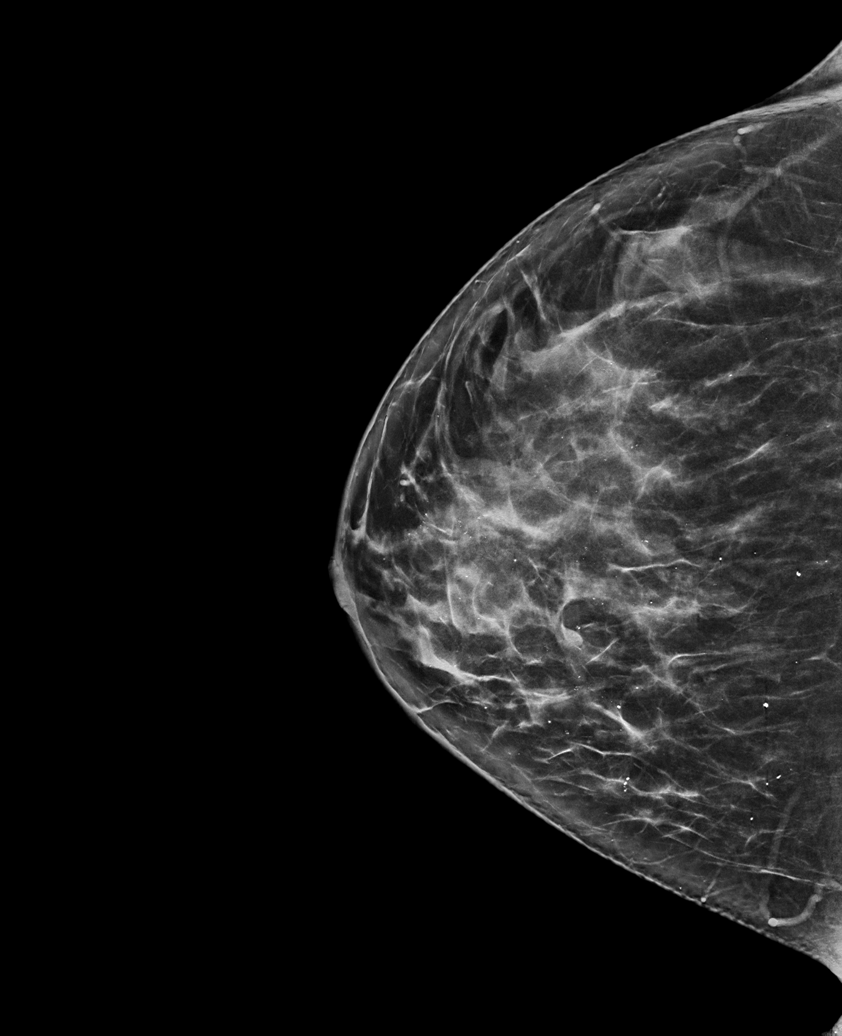

[R MLO synth-2D (1 of 2)]
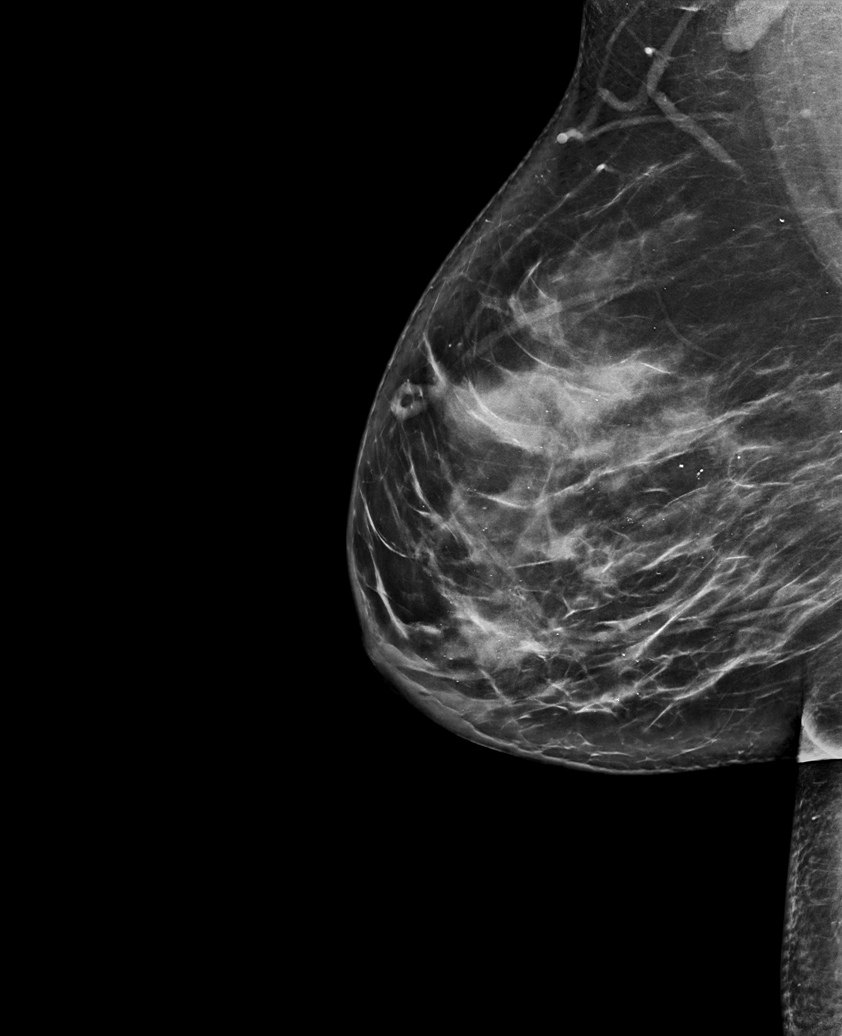

[L MLO synth-2D (2 of 2)]
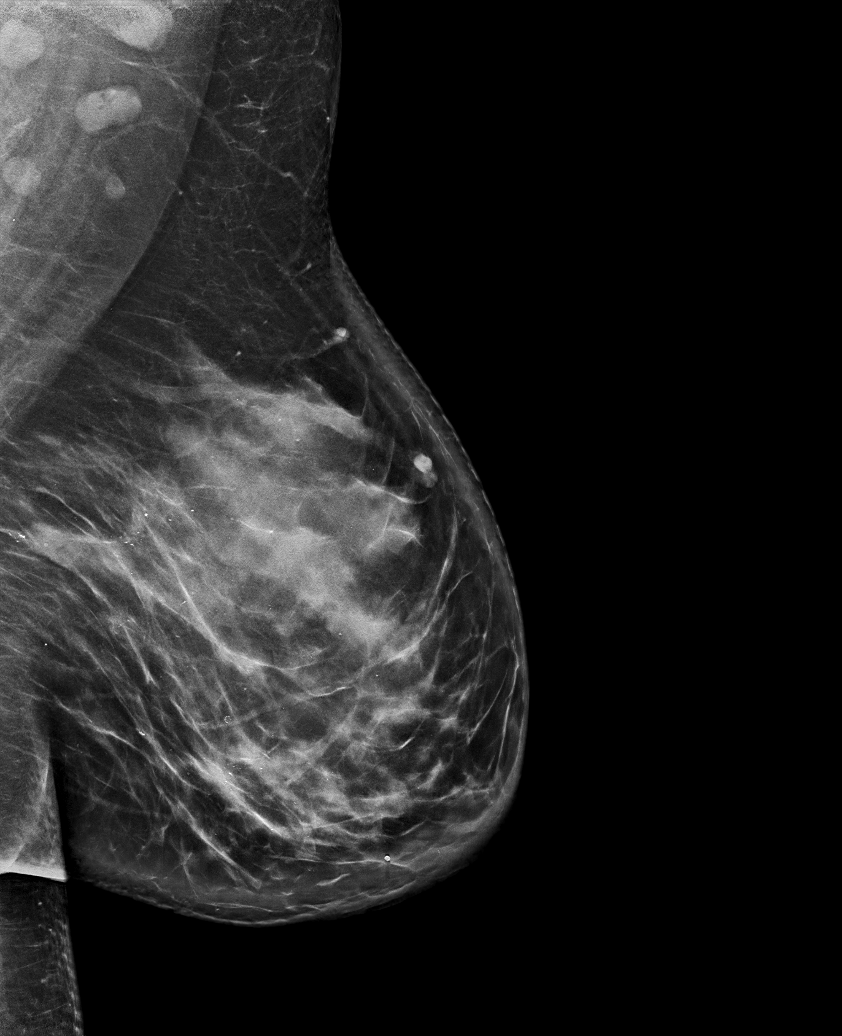

[L CC synth-2D]
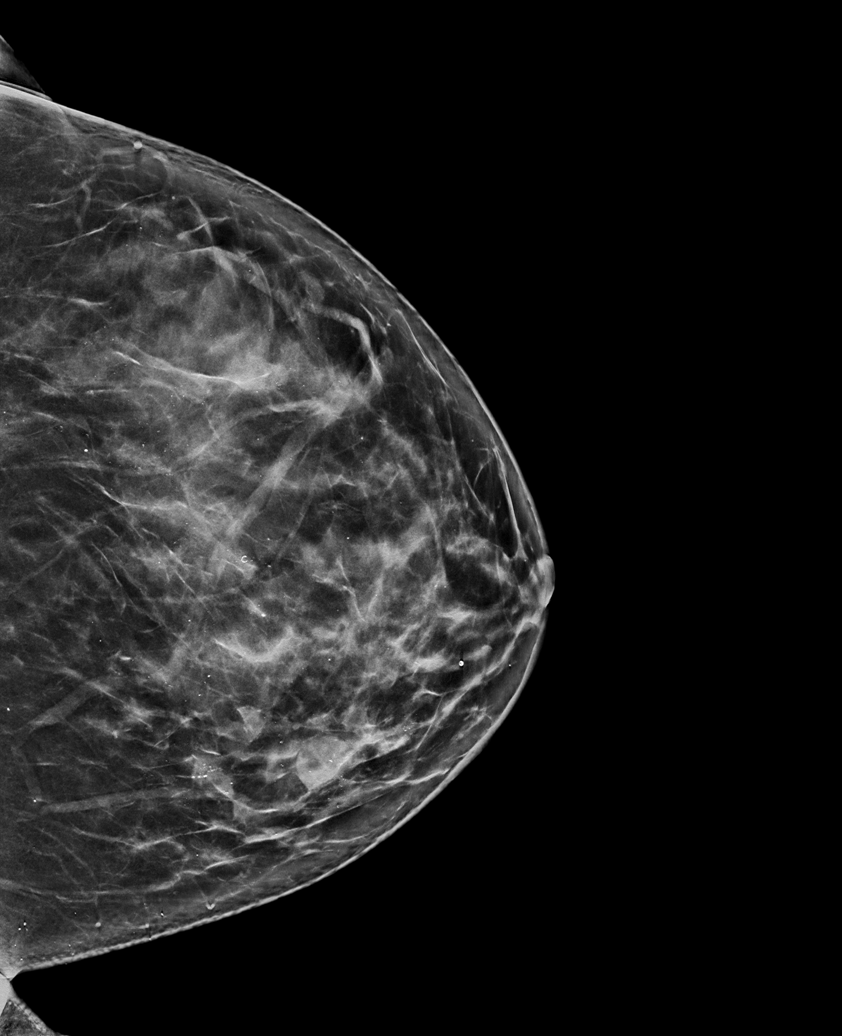

[R MLO synth-2D (2 of 2)]
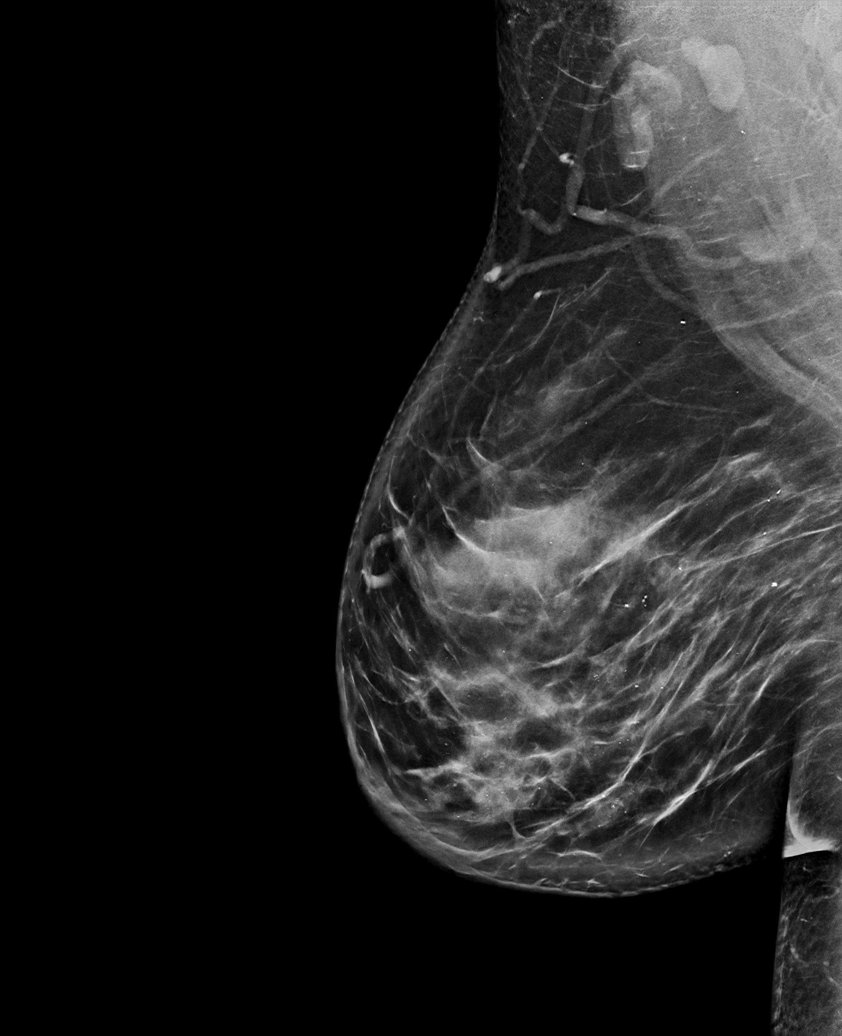

[6 of 36 positions shown; findings below may reference images not displayed]

ACR Breast Density Category c: The breast tissue is heterogeneously
dense, which may obscure small masses.
FINDINGS: In the left breast, a possible mass warrants further evaluation. In
the right breast, no findings suspicious for malignancy.
IMPRESSION: Further evaluation is suggested for a possible mass in the left
breast.

RECOMMENDATION:
Diagnostic mammogram and possibly ultrasound of the left breast.
(Code:[id number]-P-XXB)

The patient will be contacted regarding the findings, and additional
imaging will be scheduled.

BI-RADS CATEGORY  0: Incomplete. Need additional imaging evaluation
and/or prior mammograms for comparison.

## 2023-02-06 ENCOUNTER — Ambulatory Visit: Payer: 59 | Admitting: Clinical

## 2023-02-09 ENCOUNTER — Telehealth: Payer: 59 | Admitting: Family

## 2023-02-09 DIAGNOSIS — J019 Acute sinusitis, unspecified: Secondary | ICD-10-CM

## 2023-02-09 MED ORDER — AMOXICILLIN-POT CLAVULANATE 875-125 MG PO TABS: 1.0000 | ORAL_TABLET | Freq: Two times a day (BID) | ORAL | 0 refills | Status: DC

## 2023-02-09 NOTE — Progress Notes (Signed)

## 2023-02-12 ENCOUNTER — Other Ambulatory Visit: Payer: Self-pay | Admitting: Family Medicine

## 2023-02-12 MED ORDER — ZALEPLON 5 MG PO CAPS: 5.0000 mg | ORAL_CAPSULE | Freq: Every day | ORAL | 0 refills | Status: DC

## 2023-02-12 MED ORDER — CLONAZEPAM 0.5 MG PO TABS: 0.5000 mg | ORAL_TABLET | Freq: Every day | ORAL | 0 refills | Status: DC

## 2023-02-12 NOTE — Telephone Encounter (Signed)
Last office visit 01/17/23 for breast mass.  Last refilled clonazepam 01/20/2023 for #30 with no refills.  Zaleplon 01/20/2023 for #30 with no refills.  Next Appt: 06/20/2023 for CPE.

## 2023-02-13 ENCOUNTER — Ambulatory Visit (INDEPENDENT_AMBULATORY_CARE_PROVIDER_SITE_OTHER): Payer: 59 | Admitting: Clinical

## 2023-02-13 DIAGNOSIS — F411 Generalized anxiety disorder: Secondary | ICD-10-CM

## 2023-02-13 DIAGNOSIS — F331 Major depressive disorder, recurrent, moderate: Secondary | ICD-10-CM

## 2023-02-13 DIAGNOSIS — F902 Attention-deficit hyperactivity disorder, combined type: Secondary | ICD-10-CM

## 2023-02-13 NOTE — Progress Notes (Signed)
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 She had not engaged in self harm over the past two weeks. She had attended a second session with her trauma therapist, and reported that it had gone well. Session focused on continuing to understand the relation between attachment disruptions and trauma. Andrea Colon also reflected upon challenges that had arisen in parenting her son, and therapist offered validation, support, and parenting strategies. She is scheduled to be seen again in one week.  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 She expresses herself well in writing.  Client Treatment Preferences  Andrea Colon prefers in-person sessions, during school hours  Client Statement of Needs  Andrea Colon shared that she is seeking parenting skills, as well as healthier coping skills.  Treatment Level  Weekly  Symptoms  Anxiety : restlessness, difficulty relaxing, racing thoughts, difficulty concentrating, difficulty with creating and adhering to plans and routines (Status: maintained). Depression: tearfulness (Status: maintained).  Problems Addressed  New Description, New Description  Goals 1. Andrea Colon experiences difficulty with executive functioning Objective Andrea Colon will develop and implement organizational strategies Target Date: 2023-03-27 Frequency: Weekly  Progress: 0 Modality: individual  Related Interventions Therapist will help Andrea Colon to consider organizational strategies in her day-to-day life, incorporating manualized treatments such as [name] Therapist will provide opportunities to process parenting experiences in session and incorporate strategies from manualized parent training programs 2. Andrea Colon frequently experiences feelings of overwhelm that are difficult to cope with Objective Andrea Colon would like to develop coping strategies  and reduce the frequency of meltdowns  Target Date: 2023-03-27 Frequency: Weekly  Progress: 0 Modality: individual  Related Interventions Andrea Colon will be provided opportunities to process experiences in sessions Therapist will provide referrals for additional resources as appropriate  Therapist will help Andrea Colon to identify and disengage from maladaptive thoughts and behaviors using CBT-based strategies Therapist will provide emotion regulation strategies including mindfulness, relaxation, and self care techniques Diagnosis Axis none 300.02 (Generalized anxiety disorder) - Open - [Signifier: n/a]    Axis none 314.01 (Attention deficit disorder with hyperactivity) - Open - [Signifier: n/a]    Conditions For Discharge Achievement of treatment goals and objectives  Andrea Cruise, PhD               Andrea Cruise, PhD

## 2023-02-20 ENCOUNTER — Ambulatory Visit (INDEPENDENT_AMBULATORY_CARE_PROVIDER_SITE_OTHER): Payer: 59 | Admitting: Clinical

## 2023-02-20 DIAGNOSIS — F331 Major depressive disorder, recurrent, moderate: Secondary | ICD-10-CM | POA: Diagnosis not present

## 2023-02-20 DIAGNOSIS — F411 Generalized anxiety disorder: Secondary | ICD-10-CM

## 2023-02-20 DIAGNOSIS — F902 Attention-deficit hyperactivity disorder, combined type: Secondary | ICD-10-CM | POA: Diagnosis not present

## 2023-02-20 NOTE — Progress Notes (Signed)
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 She had not engaged in self harm over the past two weeks. She has continued to attend sessions with her trauma therapist, and reported that this is going well. Session focused on issues related to parenting her son. Therapist offered parenting strategies, psychoeducation, and local resources. She is scheduled to be seen again in one week.  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 She expresses herself well in writing.  Client Treatment Preferences  Andrea Colon prefers in-person sessions, during school hours  Client Statement of Needs  Andrea Colon shared that she is seeking parenting skills, as well as healthier coping skills.  Treatment Level  Weekly  Symptoms  Anxiety : restlessness, difficulty relaxing, racing thoughts, difficulty concentrating, difficulty with creating and adhering to plans and routines (Status: maintained). Depression: tearfulness (Status: maintained).  Problems Addressed  New Description, New Description  Goals 1. Andrea Colon experiences difficulty with executive functioning Objective Cerra will develop and implement organizational strategies Target Date: 2023-03-27 Frequency: Weekly  Progress: 0 Modality: individual  Related Interventions Therapist will help Taiba to consider organizational strategies in her day-to-day life, incorporating manualized treatments such as [name] Therapist will provide opportunities to process parenting experiences in session and incorporate strategies from manualized parent training programs 2. Andrea Colon frequently experiences feelings of overwhelm that are difficult to cope with Objective Andrea Colon would like to develop coping strategies and reduce the frequency of meltdowns  Target Date: 2023-03-27 Frequency: Weekly  Progress: 0 Modality:  individual  Related Interventions Andrea Colon will be provided opportunities to process experiences in sessions Therapist will provide referrals for additional resources as appropriate  Therapist will help Andrea Colon to identify and disengage from maladaptive thoughts and behaviors using CBT-based strategies Therapist will provide emotion regulation strategies including mindfulness, relaxation, and self care techniques Diagnosis Axis none 300.02 (Generalized anxiety disorder) - Open - [Signifier: n/a]    Axis none 314.01 (Attention deficit disorder with hyperactivity) - Open - [Signifier: n/a]    Conditions For Discharge Achievement of treatment goals and objectives  [name], PhD               [name], PhD

## 2023-03-05 ENCOUNTER — Ambulatory Visit (INDEPENDENT_AMBULATORY_CARE_PROVIDER_SITE_OTHER): Payer: 59 | Admitting: Clinical

## 2023-03-05 DIAGNOSIS — F902 Attention-deficit hyperactivity disorder, combined type: Secondary | ICD-10-CM | POA: Diagnosis not present

## 2023-03-05 DIAGNOSIS — F331 Major depressive disorder, recurrent, moderate: Secondary | ICD-10-CM

## 2023-03-05 DIAGNOSIS — F411 Generalized anxiety disorder: Secondary | ICD-10-CM | POA: Diagnosis not present

## 2023-03-05 NOTE — Progress Notes (Signed)
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 Andrea Colon had not engaged in self harm over the past two weeks. Andrea Colon has continued to attend sessions with her trauma therapist, and reported that this is going well. Session focused on dynamics that had emerged in those sessions, and therapist worked with her to consider how to best communicate, as well as to explore how much concerns may relate to her own desire not to make others uncomfortable. Session also focused on healthy boundary setting. Andrea Colon is scheduled to be seen again in one week.  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 Andrea Colon expresses herself well in writing.  Client Treatment Preferences  Andrea Colon prefers in-person sessions, during school hours  Client Statement of Needs  Andrea Colon shared that Andrea Colon is seeking parenting skills, as well as healthier coping skills.  Treatment Level  Weekly  Symptoms  Anxiety : restlessness, difficulty relaxing, racing thoughts, difficulty concentrating, difficulty with creating and adhering to plans and routines (Status: maintained). Depression: tearfulness (Status: maintained).  Problems Addressed  New Description, New Description  Goals 1. Andrea Colon experiences difficulty with executive functioning Objective Andrea Colon will develop and implement organizational strategies Target Date: 2023-03-27 Frequency: Weekly  Progress: 0 Modality: individual  Related Interventions Therapist will help Andrea Colon to consider organizational strategies in her day-to-day life, incorporating manualized treatments such as [name] Therapist will provide opportunities to process parenting experiences in session and incorporate strategies from manualized parent training programs 2. Andrea Colon frequently experiences feelings of overwhelm that are difficult to cope  with Objective Andrea Colon would like to develop coping strategies and reduce the frequency of meltdowns  Target Date: 2023-03-27 Frequency: Weekly  Progress: 0 Modality: individual  Related Interventions Andrea Colon will be provided opportunities to process experiences in sessions Therapist will provide referrals for additional resources as appropriate  Therapist will help Andrea Colon to identify and disengage from maladaptive thoughts and behaviors using CBT-based strategies Therapist will provide emotion regulation strategies including mindfulness, relaxation, and self care techniques Diagnosis Axis none 300.02 (Generalized anxiety disorder) - Open - [Signifier: n/a]    Axis none 314.01 (Attention deficit disorder with hyperactivity) - Open - [Signifier: n/a]    Conditions For Discharge Achievement of treatment goals and objectives            Andrea Colon Cruise, PhD

## 2023-03-12 ENCOUNTER — Encounter: Payer: Self-pay | Admitting: Obstetrics and Gynecology

## 2023-03-13 ENCOUNTER — Other Ambulatory Visit: Payer: Self-pay | Admitting: Family Medicine

## 2023-03-13 ENCOUNTER — Ambulatory Visit: Payer: 59 | Admitting: Clinical

## 2023-03-14 MED ORDER — CLONAZEPAM 0.5 MG PO TABS: 0.5000 mg | ORAL_TABLET | Freq: Every day | ORAL | 0 refills | Status: DC

## 2023-03-14 MED ORDER — ZALEPLON 5 MG PO CAPS: 5.0000 mg | ORAL_CAPSULE | Freq: Every day | ORAL | 0 refills | Status: DC

## 2023-03-14 NOTE — Telephone Encounter (Signed)
Last office visit 01/17/2023 for breast mass.  Last refilled Sonata 02/12/2023 for #30 with no refills.  Clonazepam #30 with no refills.   Next appt: CPE 06/20/2023.

## 2023-03-20 ENCOUNTER — Ambulatory Visit (INDEPENDENT_AMBULATORY_CARE_PROVIDER_SITE_OTHER): Payer: 59 | Admitting: Clinical

## 2023-03-20 DIAGNOSIS — F411 Generalized anxiety disorder: Secondary | ICD-10-CM | POA: Diagnosis not present

## 2023-03-20 DIAGNOSIS — F331 Major depressive disorder, recurrent, moderate: Secondary | ICD-10-CM

## 2023-03-20 DIAGNOSIS — F902 Attention-deficit hyperactivity disorder, combined type: Secondary | ICD-10-CM

## 2023-03-20 NOTE — Progress Notes (Signed)
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 She had not engaged in self harm over the past two weeks. Session focused on continuing to process how events from her childhood impact her currently. Therapist offered validation and support, reframing cognitive distortions and offering alternate perspectives. She is scheduled to be seen again in one week.  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 She expresses herself well in writing.  Client Treatment Preferences  Andrea Colon prefers in-person sessions, during school hours  Client Statement of Needs  Andrea Colon shared that she is seeking parenting skills, as well as healthier coping skills.  Treatment Level  Weekly  Symptoms  Anxiety : restlessness, difficulty relaxing, racing thoughts, difficulty concentrating, difficulty with creating and adhering to plans and routines (Status: maintained). Depression: tearfulness (Status: maintained).  Problems Addressed  New Description, New Description  Goals 1. Andrea Colon experiences difficulty with executive functioning Objective Andrea Colon will develop and implement organizational strategies Target Date: 2023-03-27 Frequency: Weekly  Progress: 0 Modality: individual  Related Interventions Therapist will help [name] to consider organizational strategies in her day-to-day life, incorporating manualized treatments such as [name] Therapist will provide opportunities to process parenting experiences in session and incorporate strategies from manualized parent training programs 2. Andrea Colon frequently experiences feelings of overwhelm that are difficult to cope with Objective Andrea Colon would like to develop coping strategies and reduce the frequency of meltdowns  Target Date: 2023-03-27 Frequency: Weekly  Progress: 0 Modality: individual  Related  Interventions Andrea Colon will be provided opportunities to process experiences in sessions Therapist will provide referrals for additional resources as appropriate  Therapist will help Andrea Colon to identify and disengage from maladaptive thoughts and behaviors using CBT-based strategies Therapist will provide emotion regulation strategies including mindfulness, relaxation, and self care techniques Diagnosis Axis none 300.02 (Generalized anxiety disorder) - Open - [Signifier: n/a]    Axis none 314.01 (Attention deficit disorder with hyperactivity) - Open - [Signifier: n/a]    Conditions For Discharge Achievement of treatment goals and objectives            [name], PhD               [name], PhD

## 2023-03-27 ENCOUNTER — Ambulatory Visit (INDEPENDENT_AMBULATORY_CARE_PROVIDER_SITE_OTHER): Payer: 59 | Admitting: Clinical

## 2023-03-27 DIAGNOSIS — F331 Major depressive disorder, recurrent, moderate: Secondary | ICD-10-CM

## 2023-03-27 DIAGNOSIS — F902 Attention-deficit hyperactivity disorder, combined type: Secondary | ICD-10-CM

## 2023-03-27 DIAGNOSIS — F411 Generalized anxiety disorder: Secondary | ICD-10-CM

## 2023-03-27 NOTE — Progress Notes (Signed)
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 She had not engaged in self harm over the past week. She had begun EMDR with her trauma therapist and reported that it had not worked because she had not been able to access emotions. However, therapist pointed out deflection in the moment, and Darrah was able to access emotions about her coworker retiring, which she tied to her mother abandoning her. Therapist processed this with her, encouraging her to consider how she may be able to allow herself to access these emotions with her trauma therapist. She is scheduled to be seen again in one week.  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 She expresses herself well in writing.  Client Treatment Preferences  Elide prefers in-person sessions, during school hours  Client Statement of Needs  Koby shared that she is seeking parenting skills, as well as healthier coping skills.  Treatment Level  Weekly  Symptoms  Anxiety : restlessness, difficulty relaxing, racing thoughts, difficulty concentrating, difficulty with creating and adhering to plans and routines (Status: maintained). Depression: tearfulness (Status: maintained).  Problems Addressed  New Description, New Description  Goals 1. [name] experiences difficulty with executive functioning Objective Ehlana will develop and implement organizational strategies Target Date: 2024-03-26 Frequency: Weekly  Progress: 0 Modality: individual  Related Interventions Therapist will help Lakeicha to consider organizational strategies in her day-to-day life, incorporating manualized treatments such as [name] Therapist will provide opportunities to process parenting experiences in session and incorporate strategies from manualized parent training programs 2. Celeste frequently experiences  feelings of overwhelm that are difficult to cope with Objective Breklynn would like to develop coping strategies and reduce the frequency of meltdowns  Target Date: 2024-03-26 Frequency: Weekly  Progress: 0 Modality: individual  Related Interventions Alishba will be provided opportunities to process experiences in sessions Therapist will provide referrals for additional resources as appropriate  Therapist will help Aziya to identify and disengage from maladaptive thoughts and behaviors using CBT-based strategies Therapist will provide emotion regulation strategies including mindfulness, relaxation, and self care techniques Diagnosis Axis none 300.02 (Generalized anxiety disorder) - Open - [Signifier: n/a]    Axis none 314.01 (Attention deficit disorder with hyperactivity) - Open - [Signifier: n/a]    Conditions For Discharge Achievement of treatment goals and objectives           [name], PhD               [name], PhD

## 2023-04-04 ENCOUNTER — Ambulatory Visit (INDEPENDENT_AMBULATORY_CARE_PROVIDER_SITE_OTHER): Payer: 59 | Admitting: Clinical

## 2023-04-04 DIAGNOSIS — F331 Major depressive disorder, recurrent, moderate: Secondary | ICD-10-CM

## 2023-04-04 DIAGNOSIS — F902 Attention-deficit hyperactivity disorder, combined type: Secondary | ICD-10-CM | POA: Diagnosis not present

## 2023-04-04 DIAGNOSIS — F411 Generalized anxiety disorder: Secondary | ICD-10-CM | POA: Diagnosis not present

## 2023-04-04 NOTE — Progress Notes (Signed)
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 She was in her home and the therapist was in her home at the time of the appointment. She had had a difficult week during which she had struggled with her coworker's retirement. Session focused on unpacking the ways in which this related to abandonment trauma. Khristal had engaged in self-harm and experienced suicidal ideation without plan or intent. She assured the therapist that she is safe and will follow her coping plan and go to the emergency room if plan or intent emerge. Therapist encouraged her to consider how she can take care of herself in the coming week. She is scheduled to be seen again in one week.  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 She expresses herself well in writing.  Client Treatment Preferences  Tamirah prefers in-person sessions, during school hours  Client Statement of Needs  Zaylah shared that she is seeking parenting skills, as well as healthier coping skills.  Treatment Level  Weekly  Symptoms  Anxiety : restlessness, difficulty relaxing, racing thoughts, difficulty concentrating, difficulty with creating and adhering to plans and routines (Status: maintained). Depression: tearfulness (Status: maintained).  Problems Addressed  New Description, New Description  Goals 1. Lyla Son experiences difficulty with executive functioning Objective Aniessa will develop and implement organizational strategies Target Date: 2024-03-26 Frequency: Weekly  Progress: 0 Modality: individual  Related Interventions Therapist will help Gwen to consider organizational strategies in her day-to-day life, incorporating manualized treatments such as [name] Therapist will provide opportunities to process parenting experiences in session and incorporate  strategies from manualized parent training programs 2. Shaconna frequently experiences feelings of overwhelm that are difficult to cope with Objective Aileen would like to develop coping strategies and reduce the frequency of meltdowns  Target Date: 2024-03-26 Frequency: Weekly  Progress: 0 Modality: individual  Related Interventions Jenin will be provided opportunities to process experiences in sessions Therapist will provide referrals for additional resources as appropriate  Therapist will help Nakitta to identify and disengage from maladaptive thoughts and behaviors using CBT-based strategies Therapist will provide emotion regulation strategies including mindfulness, relaxation, and self care techniques Diagnosis Axis none 300.02 (Generalized anxiety disorder) - Open - [Signifier: n/a]    Axis none 314.01 (Attention deficit disorder with hyperactivity) - Open - [Signifier: n/a]    Conditions For Discharge Achievement of treatment goals and objectives               [name], PhD

## 2023-04-08 ENCOUNTER — Other Ambulatory Visit: Payer: Self-pay | Admitting: Family Medicine

## 2023-04-09 MED ORDER — ZALEPLON 5 MG PO CAPS: 5.0000 mg | ORAL_CAPSULE | Freq: Every day | ORAL | 0 refills | Status: DC

## 2023-04-09 MED ORDER — CLONAZEPAM 0.5 MG PO TABS: 0.5000 mg | ORAL_TABLET | Freq: Every day | ORAL | 0 refills | Status: DC

## 2023-04-09 NOTE — Telephone Encounter (Signed)
Last office visit 01/17/2023 for breast mass.  Last refilled Zaleplon 03/14/2023 for #30 with no refills.  Clonazepam 03/14/2023 for #30 with no refills.  Next Appt: CPE 06/20/2023.

## 2023-04-10 ENCOUNTER — Ambulatory Visit (INDEPENDENT_AMBULATORY_CARE_PROVIDER_SITE_OTHER): Payer: 59 | Admitting: Clinical

## 2023-04-10 DIAGNOSIS — F411 Generalized anxiety disorder: Secondary | ICD-10-CM | POA: Diagnosis not present

## 2023-04-10 DIAGNOSIS — F902 Attention-deficit hyperactivity disorder, combined type: Secondary | ICD-10-CM | POA: Diagnosis not present

## 2023-04-10 DIAGNOSIS — F331 Major depressive disorder, recurrent, moderate: Secondary | ICD-10-CM | POA: Diagnosis not present

## 2023-04-10 NOTE — Progress Notes (Signed)
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 She was in her home and the therapist was in her home at the time of the appointment. She reported upon another challenging week,  but noted that she had not engaged in self-harm. Session focused on processing developments with her son. Suicidal ideation and intent were denied. She is scheduled to be seen again in one week.  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 She expresses herself well in writing.  Client Treatment Preferences  Yoland prefers in-person sessions, during school hours  Client Statement of Needs  Jasani shared that she is seeking parenting skills, as well as healthier coping skills.  Treatment Level  Weekly  Symptoms  Anxiety : restlessness, difficulty relaxing, racing thoughts, difficulty concentrating, difficulty with creating and adhering to plans and routines (Status: maintained). Depression: tearfulness (Status: maintained).  Problems Addressed  New Description, New Description  Goals 1. Lyla Son experiences difficulty with executive functioning Objective Albina will develop and implement organizational strategies Target Date: 2024-03-26 Frequency: Weekly  Progress: 0 Modality: individual  Related Interventions Therapist will help Jamine to consider organizational strategies in her day-to-day life, incorporating manualized treatments such as [name] Therapist will provide opportunities to process parenting experiences in session and incorporate strategies from manualized parent training programs 2. Davida frequently experiences feelings of overwhelm that are difficult to cope with Objective [name] would like to develop coping strategies and reduce the frequency of meltdowns  Target Date: 2024-03-26 Frequency: Weekly  Progress: 0 Modality:  individual  Related Interventions Novi will be provided opportunities to process experiences in sessions Therapist will provide referrals for additional resources as appropriate  Therapist will help Jessamyn to identify and disengage from maladaptive thoughts and behaviors using CBT-based strategies Therapist will provide emotion regulation strategies including mindfulness, relaxation, and self care techniques Diagnosis Axis none 300.02 (Generalized anxiety disorder) - Open - [Signifier: n/a]    Axis none 314.01 (Attention deficit disorder with hyperactivity) - Open - [Signifier: n/a]    Conditions For Discharge Achievement of treatment goals and objectives               [name], PhD               [name], PhD

## 2023-04-15 ENCOUNTER — Encounter: Payer: Self-pay | Admitting: Family Medicine

## 2023-04-15 ENCOUNTER — Ambulatory Visit (INDEPENDENT_AMBULATORY_CARE_PROVIDER_SITE_OTHER): Payer: 59 | Admitting: Family Medicine

## 2023-04-15 VITALS — BP 110/76 | HR 94 | Temp 97.9°F | Ht 64.0 in | Wt 222.2 lb

## 2023-04-15 DIAGNOSIS — F330 Major depressive disorder, recurrent, mild: Secondary | ICD-10-CM

## 2023-04-15 DIAGNOSIS — F411 Generalized anxiety disorder: Secondary | ICD-10-CM

## 2023-04-15 DIAGNOSIS — F5104 Psychophysiologic insomnia: Secondary | ICD-10-CM | POA: Diagnosis not present

## 2023-04-15 MED ORDER — ZALEPLON 5 MG PO CAPS: 5.0000 mg | ORAL_CAPSULE | Freq: Every day | ORAL | 0 refills | Status: DC

## 2023-04-15 MED ORDER — CLONAZEPAM 0.5 MG PO TABS: 0.5000 mg | ORAL_TABLET | Freq: Every day | ORAL | 0 refills | Status: DC | PRN

## 2023-04-15 NOTE — Assessment & Plan Note (Addendum)
Chronic, improved  She has been able to wean down to 1 tablet of Sonata at bedtime most nights, still occ requ willires an additional dose.

## 2023-04-15 NOTE — Assessment & Plan Note (Addendum)
Chronic, moderate control on current regimen of Lexapro 10 mg daily . Lamictal 74 mg weekly. Discuss with Dr. Earlene Plater medication adjustment.

## 2023-04-15 NOTE — Progress Notes (Signed)
Patient ID: [name], female    DOB: 03/11/1976, 47 y.o.   MRN: [id number]  This visit was conducted in person.  Ht  (1.626 m)   Wt 222 lb 4 oz (100.8 kg)   BMI 38.15 kg/m    CC:  Chief Complaint  Patient presents with   Medication Management    Subjective:   HPI: MERARI PION is a 47 y.o. female presenting on 04/15/2023 for Medication Management  MDD, GAD, Chronic insomnia: She had been able to wean down to 1 tablet of Sonata at bedtime intermittent nights but occassionaly uses additonal tablet.  She does still use Klonopin 0.5 mg  daily as needed anxiety, occasionally use additional one. She states this is not a change for her but she does not have a buffer of extra any more.   For example using extra for GYN appts etc.   She is having trouble at pharmacy with running out early... she wants to discuss changing prescription to be able to take an additional tablet of both these meds if needed.  Lexapro 10 mg daily Lamictal 75 mg daily On Adderal XR 20 mg daily.. prescribed By Helane Rima for weight managment Seeing counselor regularly. PDMP reviewed during this encounter. No red flags.     04/15/2023   10:30 AM 01/17/2023    4:00 PM 09/20/2022    3:02 PM 06/18/2022    2:22 PM  GAD 7 : Generalized Anxiety Score  Nervous, Anxious, on Edge Control/stop worrying Worry too much - different things 2 0 2 1  Trouble relaxing Restless Easily annoyed or irritable Afraid - awful might happen 0  Total GAD 7 Score Anxiety Difficulty  Somewhat difficult Somewhat difficult Somewhat difficult       04/15/2023   10:29 AM 01/17/2023    4:00 PM 09/20/2022    3:02 PM  Depression screen PHQ 2/9  Decreased Interest 3 0 2  Down, Depressed, Hopeless PHQ - 2 Score Altered sleeping Tired, decreased energy Change in appetite 3 0 0  Feeling bad or failure about yourself  Trouble concentrating Moving slowly or fidgety/restless 1 0 0  Suicidal thoughts PHQ-9 Score Difficult doing work/chores Somewhat difficult Somewhat difficult Somewhat difficult         Relevant past medical, surgical, family and social history reviewed and updated as indicated. Interim medical history since our last visit reviewed. Allergies and medications reviewed and updated. Outpatient Medications Prior to Visit  Medication Sig Dispense Refill   amoxicillin-clavulanate (AUGMENTIN) 875-125 MG tablet Take 1 tablet by mouth 2 (two) times daily. 14 tablet 0   amphetamine-dextroamphetamine (ADDERALL XR) 20 MG 24 hr capsule Take 20 mg by mouth daily.     Bioflavonoid Products (ESTER-C PO) Take 1,000 mg by mouth daily.     clonazePAM (KLONOPIN) 0.5 MG tablet Take 1 tablet (0.5 mg total) by mouth at bedtime. 30 tablet 0   escitalopram (LEXAPRO) 10 MG tablet Take 10 mg by mouth daily.     lamoTRIgine (LAMICTAL) 25 MG tablet Take 75 mg by mouth daily.     LINZESS 290 MCG  CAPS capsule Take 290 mcg by mouth every morning.     MAGNESIUM PO Take 1 tablet by mouth in the morning and at bedtime.     meloxicam (MOBIC) 15 MG tablet Take 15 mg by mouth daily.     Omega-3 Fatty Acids (FISH OIL PO) Take 2,200 mg by mouth daily.     spironolactone (ALDACTONE) 50 MG tablet Take 50 mg by mouth 2 (two) times daily.     tretinoin (RETIN-A) 0.025 % cream as needed.     Vitamin D, Cholecalciferol, 25 MCG (1000 UT) TABS Take 4 tablets by mouth daily.     VYVANSE 50 MG capsule Take 50 mg by mouth every morning.     zaleplon (SONATA) 5 MG capsule Take 1 capsule (5 mg total) by mouth at bedtime. 30 capsule 0   No facility-administered medications prior to visit.     Per HPI unless specifically indicated in ROS section below Review of Systems  Constitutional:  Negative for fatigue and fever.  HENT:  Negative for congestion.   Eyes:  Negative for pain.  Respiratory:  Negative for  cough and shortness of breath.   Cardiovascular:  Negative for chest pain, palpitations and leg swelling.  Gastrointestinal:  Negative for abdominal pain.  Genitourinary:  Negative for dysuria and vaginal bleeding.  Musculoskeletal:  Negative for back pain.  Neurological:  Negative for syncope, light-headedness and headaches.  Psychiatric/Behavioral:  Negative for dysphoric mood.    Objective:  Ht  (1.626 m)   Wt 222 lb 4 oz (100.8 kg)   BMI 38.15 kg/m   Wt Readings from Last 3 Encounters:  04/15/23 222 lb 4 oz (100.8 kg)  01/17/23 214 lb (97.1 kg)  11/28/22 210 lb (95.3 kg)      Physical Exam Constitutional:      General: She is not in acute distress.    Appearance: Normal appearance. She is well-developed. She is not ill-appearing or toxic-appearing.  HENT:     Head: Normocephalic.     Right Ear: Hearing, tympanic membrane, ear canal and external ear normal. Tympanic membrane is not erythematous, retracted or bulging.     Left Ear: Hearing, tympanic membrane, ear canal and external ear normal. Tympanic membrane is not erythematous, retracted or bulging.     Nose: No mucosal edema or rhinorrhea.     Right Sinus: No maxillary sinus tenderness or frontal sinus tenderness.     Left Sinus: No maxillary sinus tenderness or frontal sinus tenderness.     Mouth/Throat:     Pharynx: Uvula midline.  Eyes:     General: Lids are normal. Lids are everted, no foreign bodies appreciated.     Conjunctiva/sclera: Conjunctivae normal.     Pupils: Pupils are equal, round, and reactive to light.  Neck:     Thyroid: No thyroid mass or thyromegaly.     Vascular: No carotid bruit.     Trachea: Trachea normal.  Cardiovascular:     Rate and Rhythm: Normal rate and regular rhythm.     Pulses: Normal pulses.     Heart sounds: Normal heart sounds, S1 normal and S2 normal. No murmur heard.    No friction rub. No gallop.  Pulmonary:     Effort: Pulmonary effort is normal. No tachypnea or  respiratory distress.     Breath sounds: Normal breath sounds. No decreased breath sounds, wheezing, rhonchi or rales.  Musculoskeletal:     Cervical back: Normal range of motion and neck supple.  Skin:  General: Skin is warm and dry.     Findings: No rash.  Neurological:     Mental Status: She is alert.  Psychiatric:        Mood and Affect: Mood is not anxious or depressed.        Speech: Speech normal.        Behavior: Behavior normal. Behavior is cooperative.        Thought Content: Thought content normal.        Judgment: Judgment normal.       Results for orders placed or performed during the hospital encounter of 09/06/22  Pregnancy, urine POC  Result Value Ref Range   Preg Test, Ur NEGATIVE NEGATIVE  Surgical pathology  Result Value Ref Range   SURGICAL PATHOLOGY      SURGICAL PATHOLOGY CASE: [id number] PATIENT: Lottie Rater Surgical Pathology Report     Specimen Submitted: A. Colon polyp, sigmoid; cold snare  Clinical History: Screening colonoscopy.  Colon polyp, external hemorrhoids    DIAGNOSIS: A. COLON POLYP, SIGMOID; COLD SNARE: - SESSILE SERRATED POLYP. - NEGATIVE FOR DYSPLASIA AND MALIGNANCY.  GROSS DESCRIPTION: A. Labeled: Sigmoid colon polyp cold snare Received: Formalin Collection time: 8:32 AM on 09/06/2022 Placed into formalin time: 8:32 AM on 09/06/2022 Tissue fragment(s): 2 Size: Range from 1-1.6 cm Description: Received are 2 fragments of tan-white translucent soft tissue.  The resection margins are differentially inked and the fragments are serially sectioned. Entirely submitted in cassettes 1-2 with 1 serially sectioned fragment per cassette.  RB 09/06/2022  Final Diagnosis performed by Katherine Mantle, MD.   Electronically signed 09/09/2022 10:45:40AM The electronic signature indicates that the  named Attending Pathologist has evaluated the specimen Technical component performed at [facility], [address], [place], [place]  [address] Lab: [phone] Dir: Jolene Schimke, MD, MMM  Professional component performed at [facility], [facility], [address] [place], [place], [place] [address] Lab: [phone] Dir: Beryle Quant, MD     Assessment and Plan  There are no diagnoses linked to this encounter.  No follow-ups on file.   Kerby Nora, MD

## 2023-04-15 NOTE — Patient Instructions (Signed)
Consider increasing the lexapro.  Continue with counselor.

## 2023-04-15 NOTE — Assessment & Plan Note (Addendum)
Chronic,  moderate control...    Discuss increasing dose of lexapro or lamictal with Dr. Earlene Plater to facilitate  work on reducing clonazepam use. Continue twice weekly counseling and self-care, healthy lifestyle habits.

## 2023-04-17 ENCOUNTER — Ambulatory Visit (INDEPENDENT_AMBULATORY_CARE_PROVIDER_SITE_OTHER): Payer: 59 | Admitting: Clinical

## 2023-04-17 DIAGNOSIS — F902 Attention-deficit hyperactivity disorder, combined type: Secondary | ICD-10-CM | POA: Diagnosis not present

## 2023-04-17 DIAGNOSIS — F331 Major depressive disorder, recurrent, moderate: Secondary | ICD-10-CM | POA: Diagnosis not present

## 2023-04-17 DIAGNOSIS — F411 Generalized anxiety disorder: Secondary | ICD-10-CM | POA: Diagnosis not present

## 2023-04-17 NOTE — Progress Notes (Signed)
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 She reported upon a challenging few weeks, including having felt abandoned by several figures in her life. She shared with the therapist that she had consumed alcohol while taking her sleeping medications as prescribed. Suicidal ideation and intent were denied. Therapist shared concerns for her safety, and Andrea Colon agreed to pour out alcohol and refrain from drinking while taking her sleep medication. She is scheduled to be seen again in one week.  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 She expresses herself well in writing.  Client Treatment Preferences  Andrea Colon prefers in-person sessions, during school hours  Client Statement of Needs  Andrea Colon shared that she is seeking parenting skills, as well as healthier coping skills.  Treatment Level  Weekly  Symptoms  Anxiety : restlessness, difficulty relaxing, racing thoughts, difficulty concentrating, difficulty with creating and adhering to plans and routines (Status: maintained). Depression: tearfulness (Status: maintained).  Problems Addressed  New Description, New Description  Goals 1. Andrea Colon experiences difficulty with executive functioning Objective Andrea Colon will develop and implement organizational strategies Target Date: 2024-03-26 Frequency: Weekly  Progress: 0 Modality: individual  Related Interventions Therapist will help Andrea Colon to consider organizational strategies in her day-to-day life, incorporating manualized treatments such as [name] Therapist will provide opportunities to process parenting experiences in session and incorporate strategies from manualized parent training programs 2. Andrea Colon frequently experiences feelings of overwhelm that are difficult to cope with Objective Andrea Colon would like to develop coping  strategies and reduce the frequency of meltdowns  Target Date: 2024-03-26 Frequency: Weekly  Progress: 0 Modality: individual  Related Interventions Andrea Colon will be provided opportunities to process experiences in sessions Therapist will provide referrals for additional resources as appropriate  Therapist will help Andrea Colon to identify and disengage from maladaptive thoughts and behaviors using CBT-based strategies Therapist will provide emotion regulation strategies including mindfulness, relaxation, and self care techniques Diagnosis Axis none 300.02 (Generalized anxiety disorder) - Open - [Signifier: n/a]    Axis none 314.01 (Attention deficit disorder with hyperactivity) - Open - [Signifier: n/a]    Conditions For Discharge Achievement of treatment goals and objectives                   Andrea [name], PhD               Andrea [name], PhD

## 2023-04-24 ENCOUNTER — Ambulatory Visit (INDEPENDENT_AMBULATORY_CARE_PROVIDER_SITE_OTHER): Payer: 59 | Admitting: Clinical

## 2023-04-24 DIAGNOSIS — F411 Generalized anxiety disorder: Secondary | ICD-10-CM

## 2023-04-24 DIAGNOSIS — F902 Attention-deficit hyperactivity disorder, combined type: Secondary | ICD-10-CM | POA: Diagnosis not present

## 2023-04-24 DIAGNOSIS — F331 Major depressive disorder, recurrent, moderate: Secondary | ICD-10-CM | POA: Diagnosis not present

## 2023-04-24 NOTE — Progress Notes (Signed)
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 She reported that she had not had any alcohol with her last visit, and had experienced an improvement in mood that included no further suicidal ideation. She had disclosed her mixing of alcohol with sleep medications to her weight management doctor as discussed. Therapist engaged her in a discussion of her progress in therapy and review of treatment plan. She provided verbal input into and consent for all aspects of her treatment plan. She is scheduled to be seen again in one week.  General Behavior: WNL Attire: WNL Gait: WNL Motor Activity: WNL Stream of Thought - Productivity: WNL Stream of thought - Progression: WNL Stream of thought - Language:  WNL Emotional tone and reactions - Mood: WNL  Emotional tone and reactions - Affect: WNL Mental trend/Content of thoughts - Perception: WNL  Mental trend/Content of thoughts - Orientation: WNL Mental trend/Content of thoughts - Memory: WNL Mental trend/Content of thoughts - General knowledge: WNL  Insight: WNL Judgment: WNL Intelligence: WNL Mental Status Comment: Client appears oriented to time and space. 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  Client Treatment Preferences  Andrea Colon prefers in-person sessions, during school hours  Client Statement of Needs  Andrea Colon shared that she is seeking parenting skills, as well as healthier coping skills.  Treatment Level  Weekly  Symptoms  Anxiety : restlessness, difficulty relaxing, racing thoughts, difficulty concentrating, difficulty with creating and adhering to plans and routines (Status: maintained). Depression: tearfulness (Status: maintained).  Problems Addressed  New Description, New  Description  Goals 1. Andrea Colon would like to process past experiences in order to better understand how they impact her at present Objective Andrea Colon will increase ability to connect to and understand emotional experiences, especially those related to past traumas  Target Date: 2024-03-26 Frequency: Weekly  Progress: 0 Modality: individual  Related Interventions Therapist will help Andrea Colon to consider organizational strategies in her day-to-day life, incorporating manualized treatments such as [name] Therapist will provide opportunities to process parenting experiences in session and incorporate strategies from manualized parent training programs 2. Andrea Colon frequently experiences feelings of overwhelm that are difficult to cope with Objective Andrea Colon would like to develop coping strategies and reduce the frequency of meltdowns  Target Date: 2024-03-26 Frequency: Weekly  Progress: 0 Modality: individual  Related Interventions Andrea Colon will be provided opportunities to process experiences in sessions Therapist will provide referrals for additional resources as appropriate  Therapist will help Andrea Colon to identify and disengage from maladaptive thoughts and behaviors using CBT-based strategies Therapist will provide emotion regulation strategies including mindfulness, relaxation, and self care techniques Diagnosis Axis none 300.02 (Generalized anxiety disorder) - Open - [Signifier: n/a]    Axis none 314.01 (Attention deficit disorder with hyperactivity) - Open - [Signifier: n/a]    Conditions For Discharge Achievement of treatment goals and objectives                   Andrea [name], PhD               Andrea [name], PhD               Andrea [name], PhD

## 2023-05-01 ENCOUNTER — Ambulatory Visit (INDEPENDENT_AMBULATORY_CARE_PROVIDER_SITE_OTHER): Payer: 59 | Admitting: Clinical

## 2023-05-01 DIAGNOSIS — F331 Major depressive disorder, recurrent, moderate: Secondary | ICD-10-CM | POA: Diagnosis not present

## 2023-05-01 DIAGNOSIS — F902 Attention-deficit hyperactivity disorder, combined type: Secondary | ICD-10-CM | POA: Diagnosis not present

## 2023-05-01 DIAGNOSIS — F411 Generalized anxiety disorder: Secondary | ICD-10-CM | POA: Diagnosis not present

## 2023-05-01 NOTE — Progress Notes (Signed)
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 She reported a continued improvement in mood and no self harm since her last session. She reflected upon several stressful experiences with her son. Therapist provided psychoeducation and practice of diaphragmatic breathing. She agreed to practice this for homework. She is scheduled to be seen again in one week.  General Behavior: WNL Attire: WNL Gait: WNL Motor Activity: WNL Stream of Thought - Productivity: WNL Stream of thought - Progression: WNL Stream of thought - Language:  WNL Emotional tone and reactions - Mood: WNL  Emotional tone and reactions - Affect: WNL Mental trend/Content of thoughts - Perception: WNL  Mental trend/Content of thoughts - Orientation: WNL Mental trend/Content of thoughts - Memory: WNL Mental trend/Content of thoughts - General knowledge: WNL  Insight: WNL Judgment: WNL Intelligence: WNL Mental Status Comment: Client appears oriented to time and space. 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  Client Treatment Preferences  Andrea Colon prefers in-person sessions, during school hours  Client Statement of Needs  Andrea Colon shared that she is seeking parenting skills, as well as healthier coping skills.  Treatment Level  Weekly  Symptoms  Anxiety : restlessness, difficulty relaxing, racing thoughts, difficulty concentrating, difficulty with creating and adhering to plans and routines (Status: maintained). Depression: tearfulness (Status: maintained).  Problems Addressed  New Description, New Description  Goals 1. Andrea Colon would like to process past experiences in order to better understand how they impact her at present Objective Andrea Colon will increase ability to  connect to and understand emotional experiences, especially those related to past traumas  Target Date: 2024-03-26 Frequency: Weekly  Progress: 0 Modality: individual  Related Interventions Therapist will help [name] to consider organizational strategies in her day-to-day life, incorporating manualized treatments such as [name] Therapist will provide opportunities to process parenting experiences in session and incorporate strategies from manualized parent training programs 2. Andrea Colon frequently experiences feelings of overwhelm that are difficult to cope with Objective Andrea Colon would like to develop coping strategies and reduce the frequency of meltdowns  Target Date: 2024-03-26 Frequency: Weekly  Progress: 0 Modality: individual  Related Interventions Andrea Colon will be provided opportunities to process experiences in sessions Therapist will provide referrals for additional resources as appropriate  Therapist will help Andrea Colon to identify and disengage from maladaptive thoughts and behaviors using CBT-based strategies Therapist will provide emotion regulation strategies including mindfulness, relaxation, and self care techniques Diagnosis Axis none 300.02 (Generalized anxiety disorder) - Open - [Signifier: n/a]    Axis none 314.01 (Attention deficit disorder with hyperactivity) - Open - [Signifier: n/a]    Conditions For Discharge Achievement of treatment goals and objectives                  Andrea [name], PhD               Andrea [name], PhD

## 2023-05-08 ENCOUNTER — Ambulatory Visit (INDEPENDENT_AMBULATORY_CARE_PROVIDER_SITE_OTHER): Payer: 59 | Admitting: Clinical

## 2023-05-08 DIAGNOSIS — F331 Major depressive disorder, recurrent, moderate: Secondary | ICD-10-CM

## 2023-05-08 DIAGNOSIS — F411 Generalized anxiety disorder: Secondary | ICD-10-CM

## 2023-05-08 DIAGNOSIS — F902 Attention-deficit hyperactivity disorder, combined type: Secondary | ICD-10-CM

## 2023-05-08 NOTE — Progress Notes (Signed)
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 She reported no self harm in the past week, and shared that her mood has continued to be stable. She had practiced deep breathing and reported that she found it helpful. Session focused on processing dynamics in her marriage. She agreed to continue practicing deep breathing for homework. She is scheduled to be seen again in one week.  General Behavior: WNL Attire: WNL Gait: WNL Motor Activity: WNL Stream of Thought - Productivity: WNL Stream of thought - Progression: WNL Stream of thought - Language:  WNL Emotional tone and reactions - Mood: WNL  Emotional tone and reactions - Affect: WNL Mental trend/Content of thoughts - Perception: WNL  Mental trend/Content of thoughts - Orientation: WNL Mental trend/Content of thoughts - Memory: WNL Mental trend/Content of thoughts - General knowledge: WNL  Insight: WNL Judgment: WNL Intelligence: WNL Mental Status Comment: Client appears oriented to time and space. 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  Client Treatment Preferences  Andrea Colon prefers in-person sessions, during school hours  Client Statement of Needs  Andrea Colon shared that she is seeking parenting skills, as well as healthier coping skills.  Treatment Level  Weekly  Symptoms  Anxiety : restlessness, difficulty relaxing, racing thoughts, difficulty concentrating, difficulty with creating and adhering to plans and routines (Status: maintained). Depression: tearfulness (Status: maintained).  Problems Addressed  New Description, New Description  Goals 1. Andrea Colon would like to process past experiences in order to better understand how they impact her at present Objective Andrea Colon will  increase ability to connect to and understand emotional experiences, especially those related to past traumas  Target Date: 2024-03-26 Frequency: Weekly  Progress: 0 Modality: individual  Related Interventions Therapist will help Andrea Colon to consider organizational strategies in her day-to-day life, incorporating manualized treatments such as [name] Therapist will provide opportunities to process parenting experiences in session and incorporate strategies from manualized parent training programs 2. Andrea Colon frequently experiences feelings of overwhelm that are difficult to cope with Objective Andrea Colon would like to develop coping strategies and reduce the frequency of meltdowns  Target Date: 2024-03-26 Frequency: Weekly  Progress: 0 Modality: individual  Related Interventions Andrea Colon will be provided opportunities to process experiences in sessions Therapist will provide referrals for additional resources as appropriate  Therapist will help Andrea Colon to identify and disengage from maladaptive thoughts and behaviors using CBT-based strategies Therapist will provide emotion regulation strategies including mindfulness, relaxation, and self care techniques Diagnosis Axis none 300.02 (Generalized anxiety disorder) - Open - [Signifier: n/a]    Axis none 314.01 (Attention deficit disorder with hyperactivity) - Open - [Signifier: n/a]    Conditions For Discharge Achievement of treatment goals and objectives     [name], PhD               [name], PhD

## 2023-05-15 ENCOUNTER — Ambulatory Visit (INDEPENDENT_AMBULATORY_CARE_PROVIDER_SITE_OTHER): Payer: 59 | Admitting: Clinical

## 2023-05-15 DIAGNOSIS — F411 Generalized anxiety disorder: Secondary | ICD-10-CM

## 2023-05-15 DIAGNOSIS — F902 Attention-deficit hyperactivity disorder, combined type: Secondary | ICD-10-CM

## 2023-05-15 DIAGNOSIS — F331 Major depressive disorder, recurrent, moderate: Secondary | ICD-10-CM | POA: Diagnosis not present

## 2023-05-15 NOTE — Progress Notes (Signed)
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 She reported no self harm in the past week, and readily engaged in a breathing exercise at the start of session. She reported that she has been doing breathing exercises regularly. Session focused on further processing traumatic events from her past, and considering how these experiences continue to influence her current relationships. She is scheduled to be seen again in one week.  General Behavior: WNL Attire: WNL Gait: WNL Motor Activity: WNL Stream of Thought - Productivity: WNL Stream of thought - Progression: WNL Stream of thought - Language:  WNL Emotional tone and reactions - Mood: WNL  Emotional tone and reactions - Affect: WNL Mental trend/Content of thoughts - Perception: WNL  Mental trend/Content of thoughts - Orientation: WNL Mental trend/Content of thoughts - Memory: WNL Mental trend/Content of thoughts - General knowledge: WNL  Insight: WNL Judgment: WNL Intelligence: WNL Mental Status Comment: Client appears oriented to time and space. 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  Client Treatment Preferences  Shanyah prefers in-person sessions, during school hours  Client Statement of Needs  Andrea Colon shared that she is seeking parenting skills, as well as healthier coping skills.  Treatment Level  Weekly  Symptoms  Anxiety : restlessness, difficulty relaxing, racing thoughts, difficulty concentrating, difficulty with creating and adhering to plans and routines (Status: maintained). Depression: tearfulness (Status: maintained).  Problems Addressed  New Description, New Description  Goals 1. Andrea Colon would like to process past experiences in order to better understand how they  impact her at present Objective Andrea Colon will increase ability to connect to and understand emotional experiences, especially those related to past traumas  Target Date: 2024-03-26 Frequency: Weekly  Progress: 0 Modality: individual  Related Interventions Therapist will help [name] to consider organizational strategies in her day-to-day life, incorporating manualized treatments such as [name] Therapist will provide opportunities to process parenting experiences in session and incorporate strategies from manualized parent training programs 2. Lianet frequently experiences feelings of overwhelm that are difficult to cope with Objective [name] would like to develop coping strategies and reduce the frequency of meltdowns  Target Date: 2024-03-26 Frequency: Weekly  Progress: 0 Modality: individual  Related Interventions [name] will be provided opportunities to process experiences in sessions Therapist will provide referrals for additional resources as appropriate  Therapist will help [name] to identify and disengage from maladaptive thoughts and behaviors using CBT-based strategies Therapist will provide emotion regulation strategies including mindfulness, relaxation, and self care techniques Diagnosis Axis none 300.02 (Generalized anxiety disorder) - Open - [Signifier: n/a]    Axis none 314.01 (Attention deficit disorder with hyperactivity) - Open - [Signifier: n/a]    Conditions For Discharge Achievement of treatment goals and objectives     Andrea [name], PhD               Andrea Colon, PhDTime: 9:00 am-9:55 am CPT Code: [id number] 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 She reported no self harm in the past week, and shared that her mood has continued to be stable. She had practiced deep breathing and reported that she found it helpful. Session focused on processing dynamics in her marriage. She  agreed to continue practicing deep breathing for homework. She is scheduled to  be seen again in one week.  General Behavior: WNL Attire: WNL Gait: WNL Motor Activity: WNL Stream of Thought - Productivity: WNL Stream of thought - Progression: WNL Stream of thought - Language:  WNL Emotional tone and reactions - Mood: WNL  Emotional tone and reactions - Affect: WNL Mental trend/Content of thoughts - Perception: WNL  Mental trend/Content of thoughts - Orientation: WNL Mental trend/Content of thoughts - Memory: WNL Mental trend/Content of thoughts - General knowledge: WNL  Insight: WNL Judgment: WNL Intelligence: WNL Mental Status Comment: Client appears oriented to time and space. 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  Client Treatment Preferences  [name] prefers in-person sessions, during school hours  Client Statement of Needs  Andrea Colon shared that she is seeking parenting skills, as well as healthier coping skills.  Treatment Level  Weekly  Symptoms  Anxiety : restlessness, difficulty relaxing, racing thoughts, difficulty concentrating, difficulty with creating and adhering to plans and routines (Status: maintained). Depression: tearfulness (Status: maintained).  Problems Addressed  New Description, New Description  Goals 1. Andrea Colon would like to process past experiences in order to better understand how they impact her at present Objective [name] will increase ability to connect to and understand emotional experiences, especially those related to past traumas  Target Date: 2024-03-26 Frequency: Weekly  Progress: 0 Modality: individual  Related Interventions Therapist will help Andrea Colon to consider organizational strategies in her day-to-day life, incorporating manualized treatments such as Mastering Your  Adult ADHD Therapist will provide opportunities to process parenting experiences in session and incorporate strategies from manualized parent training programs 2. Andrea Colon frequently experiences feelings of overwhelm that are difficult to cope with Objective Emony would like to develop coping strategies and reduce the frequency of meltdowns  Target Date: 2024-03-26 Frequency: Weekly  Progress: 0 Modality: individual  Related Interventions Leonardo will be provided opportunities to process experiences in sessions Therapist will provide referrals for additional resources as appropriate  Therapist will help Ermine to identify and disengage from maladaptive thoughts and behaviors using CBT-based strategies Therapist will provide emotion regulation strategies including mindfulness, relaxation, and self care techniques Diagnosis Axis none 300.02 (Generalized anxiety disorder) - Open - [Signifier: n/a]    Axis none 314.01 (Attention deficit disorder with hyperactivity) - Open - [Signifier: n/a]    Conditions For Discharge Achievement of treatment goals and objectives      Andrea [name], PhD               Andrea [name], PhD

## 2023-05-22 ENCOUNTER — Ambulatory Visit (INDEPENDENT_AMBULATORY_CARE_PROVIDER_SITE_OTHER): Payer: 59 | Admitting: Clinical

## 2023-05-22 DIAGNOSIS — F331 Major depressive disorder, recurrent, moderate: Secondary | ICD-10-CM

## 2023-05-22 DIAGNOSIS — F411 Generalized anxiety disorder: Secondary | ICD-10-CM | POA: Diagnosis not present

## 2023-05-22 DIAGNOSIS — F902 Attention-deficit hyperactivity disorder, combined type: Secondary | ICD-10-CM

## 2023-05-22 NOTE — Progress Notes (Signed)
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 She reported no self harm Andrea Colon the past week, as well as no urges to self-harm. Session focused on continuing to process dynamics Andrea Colon her relationship, including looking back toward factors present at the start of the relationship. She is scheduled to be seen again Andrea Colon one week.  General Behavior: WNL Attire: WNL Gait: WNL Motor Activity: WNL Stream of Thought - Productivity: WNL Stream of thought - Progression: WNL Stream of thought - Language:  WNL Emotional tone and reactions - Mood: WNL  Emotional tone and reactions - Affect: WNL Mental trend/Content of thoughts - Perception: WNL  Mental trend/Content of thoughts - Orientation: WNL Mental trend/Content of thoughts - Memory: WNL Mental trend/Content of thoughts - General knowledge: WNL  Insight: WNL Judgment: WNL Intelligence: WNL Mental Status Comment: Client appears oriented to time and space. 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  Client Treatment Preferences  Andrea Colon prefers Andrea Colon-person sessions, during school hours  Client Statement of Needs  Andrea Colon shared that she is seeking parenting skills, as well as healthier coping skills.  Treatment Level  Weekly  Symptoms  Anxiety : restlessness, difficulty relaxing, racing thoughts, difficulty concentrating, difficulty with creating and adhering to plans and routines (Status: maintained). Depression: tearfulness (Status: maintained).  Problems Addressed  New Description, New Description  Goals 1. Andrea Colon would like to process past experiences Andrea Colon order to better understand how they impact her at present Objective Andrea Colon will increase ability to connect to and understand emotional  experiences, especially those related to past traumas  Target Date: 2024-03-26 Frequency: Weekly  Progress: 0 Modality: individual  Related Interventions Therapist will help Vernika to consider organizational strategies Andrea Colon her day-to-day life, incorporating manualized treatments such as [name] Therapist will provide opportunities to process parenting experiences Andrea Colon session and incorporate strategies from manualized parent training programs 2. Andrea Colon frequently experiences feelings of overwhelm that are difficult to cope with Objective Andrea Colon would like to develop coping strategies and reduce the frequency of meltdowns  Target Date: 2024-03-26 Frequency: Weekly  Progress: 0 Modality: individual  Related Interventions Andrea Colon will be provided opportunities to process experiences Andrea Colon sessions Therapist will provide referrals for additional resources as appropriate  Therapist will help Andrea Colon to identify and disengage from maladaptive thoughts and behaviors using CBT-based strategies Therapist will provide emotion regulation strategies including mindfulness, relaxation, and self care techniques Diagnosis Axis none 300.02 (Generalized anxiety disorder) - Open - [Signifier: n/a]    Axis none 314.01 (Attention deficit disorder with hyperactivity) - Open - [Signifier: n/a]    Conditions For Discharge Achievement of treatment goals and objectives     Andrea [name], PhD               Andrea Colon, PhDTime: 9:00 am-9:55 am CPT Code: [id number] 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 She reported no self harm Andrea Colon the past week, and shared that her mood has continued to be stable. She had practiced deep breathing and reported that she found it helpful. Session focused on processing dynamics Andrea Colon her marriage. She agreed to continue practicing deep breathing for homework. She is scheduled to be seen again Andrea Colon one  week.  General Behavior: WNL Attire: WNL Gait: WNL  Motor Activity: WNL Stream of Thought - Productivity: WNL Stream of thought - Progression: WNL Stream of thought - Language:  WNL Emotional tone and reactions - Mood: WNL  Emotional tone and reactions - Affect: WNL Mental trend/Content of thoughts - Perception: WNL  Mental trend/Content of thoughts - Orientation: WNL Mental trend/Content of thoughts - Memory: WNL Mental trend/Content of thoughts - General knowledge: WNL  Insight: WNL Judgment: WNL Intelligence: WNL Mental Status Comment: Client appears oriented to time and space. 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  Client Treatment Preferences  Andrea Colon prefers Andrea Colon-person sessions, during school hours  Client Statement of Needs  Andrea Colon shared that she is seeking parenting skills, as well as healthier coping skills.  Treatment Level  Weekly  Symptoms  Anxiety : restlessness, difficulty relaxing, racing thoughts, difficulty concentrating, difficulty with creating and adhering to plans and routines (Status: maintained). Depression: tearfulness (Status: maintained).  Problems Addressed  New Description, New Description  Goals 1. Andrea Colon would like to process past experiences Andrea Colon order to better understand how they impact her at present Objective Andrea Colon will increase ability to connect to and understand emotional experiences, especially those related to past traumas  Target Date: 2024-03-26 Frequency: Weekly  Progress: 0 Modality: individual  Related Interventions Therapist will help Andrea Colon to consider organizational strategies Andrea Colon her day-to-day life, incorporating manualized treatments such as [name] Therapist will provide opportunities to process parenting experiences Andrea Colon session and  incorporate strategies from manualized parent training programs 2. Andrea Colon frequently experiences feelings of overwhelm that are difficult to cope with Objective Andrea Colon would like to develop coping strategies and reduce the frequency of meltdowns  Target Date: 2024-03-26 Frequency: Weekly  Progress: 0 Modality: individual  Related Interventions Andrea Colon will be provided opportunities to process experiences Andrea Colon sessions Therapist will provide referrals for additional resources as appropriate  Therapist will help Andrea Colon to identify and disengage from maladaptive thoughts and behaviors using CBT-based strategies Therapist will provide emotion regulation strategies including mindfulness, relaxation, and self care techniques Diagnosis Axis none 300.02 (Generalized anxiety disorder) - Open - [Signifier: n/a]    Axis none 314.01 (Attention deficit disorder with hyperactivity) - Open - [Signifier: n/a]    Conditions For Discharge Achievement of treatment goals and objectives       Andrea [name], PhD               Andrea [name], PhD

## 2023-05-29 ENCOUNTER — Ambulatory Visit (INDEPENDENT_AMBULATORY_CARE_PROVIDER_SITE_OTHER): Payer: 59 | Admitting: Clinical

## 2023-05-29 DIAGNOSIS — F902 Attention-deficit hyperactivity disorder, combined type: Secondary | ICD-10-CM

## 2023-05-29 DIAGNOSIS — F411 Generalized anxiety disorder: Secondary | ICD-10-CM

## 2023-05-29 DIAGNOSIS — F331 Major depressive disorder, recurrent, moderate: Secondary | ICD-10-CM

## 2023-05-29 NOTE — Progress Notes (Signed)
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 She reported no self harm in the past week, as well as no urges to self-harm. Session continued to focus on dynamics in her relationship. She is scheduled to be seen again in one week.  General Behavior: WNL Attire: WNL Gait: WNL Motor Activity: WNL Stream of Thought - Productivity: WNL Stream of thought - Progression: WNL Stream of thought - Language:  WNL Emotional tone and reactions - Mood: WNL  Emotional tone and reactions - Affect: WNL Mental trend/Content of thoughts - Perception: WNL  Mental trend/Content of thoughts - Orientation: WNL Mental trend/Content of thoughts - Memory: WNL Mental trend/Content of thoughts - General knowledge: WNL  Insight: WNL Judgment: WNL Intelligence: WNL Mental Status Comment: Client appears oriented to time and space. 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  Client Treatment Preferences  Rhapsody prefers in-person sessions, during school hours  Client Statement of Needs  Andrea Colon shared that she is seeking parenting skills, as well as healthier coping skills.  Treatment Level  Weekly  Symptoms  Anxiety : restlessness, difficulty relaxing, racing thoughts, difficulty concentrating, difficulty with creating and adhering to plans and routines (Status: maintained). Depression: tearfulness (Status: maintained).  Problems Addressed  New Description, New Description  Goals 1. Andrea Colon would like to process past experiences in order to better understand how they impact her at present Objective Andrea Colon will increase ability to connect to and understand emotional experiences, especially those related to past traumas  Target Date: 2024-03-26 Frequency: Weekly   Progress: 0 Modality: individual  Related Interventions Therapist will help Andrea Colon to consider organizational strategies in her day-to-day life, incorporating manualized treatments such as [name] Therapist will provide opportunities to process parenting experiences in session and incorporate strategies from manualized parent training programs 2. Andrea Colon frequently experiences feelings of overwhelm that are difficult to cope with Objective Andrea Colon would like to develop coping strategies and reduce the frequency of meltdowns  Target Date: 2024-03-26 Frequency: Weekly  Progress: 0 Modality: individual  Related Interventions Normandie will be provided opportunities to process experiences in sessions Therapist will provide referrals for additional resources as appropriate  Therapist will help Andrea Colon to identify and disengage from maladaptive thoughts and behaviors using CBT-based strategies Therapist will provide emotion regulation strategies including mindfulness, relaxation, and self care techniques Diagnosis Axis none 300.02 (Generalized anxiety disorder) - Open - [Signifier: n/a]    Axis none 314.01 (Attention deficit disorder with hyperactivity) - Open - [Signifier: n/a]    Conditions For Discharge Achievement of treatment goals and objectives     Andrea [name], Andrea Colon               Andrea Colon, PhDTime: 9:00 am-9:55 am CPT Code: [id number] 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 She reported no self harm in the past week, and shared that her mood has continued to be stable. She had practiced deep breathing and reported that she found it helpful. Session focused on processing dynamics in her marriage. She agreed to continue practicing deep breathing for homework. She is scheduled to be seen again in one week.  General Behavior: WNL Attire: WNL Gait: WNL Motor Activity: WNL Stream of Thought -  Productivity: WNL Stream of thought -  Progression: WNL Stream of thought - Language:  WNL Emotional tone and reactions - Mood: WNL  Emotional tone and reactions - Affect: WNL Mental trend/Content of thoughts - Perception: WNL  Mental trend/Content of thoughts - Orientation: WNL Mental trend/Content of thoughts - Memory: WNL Mental trend/Content of thoughts - General knowledge: WNL  Insight: WNL Judgment: WNL Intelligence: WNL Mental Status Comment: Client appears oriented to time and space. 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  Client Treatment Preferences  Andrea Colon prefers in-person sessions, during school hours  Client Statement of Needs  Andrea Colon shared that she is seeking parenting skills, as well as healthier coping skills.  Treatment Level  Weekly  Symptoms  Anxiety : restlessness, difficulty relaxing, racing thoughts, difficulty concentrating, difficulty with creating and adhering to plans and routines (Status: maintained). Depression: tearfulness (Status: maintained).  Problems Addressed  New Description, New Description  Goals 1. Andrea Colon would like to process past experiences in order to better understand how they impact her at present Objective Andrea Colon will increase ability to connect to and understand emotional experiences, especially those related to past traumas  Target Date: 2024-03-26 Frequency: Weekly  Progress: 0 Modality: individual  Related Interventions Therapist will help Andrea Colon to consider organizational strategies in her day-to-day life, incorporating manualized treatments such as [name] Therapist will provide opportunities to process parenting experiences in session and incorporate strategies from manualized parent training programs 2. Andrea Colon frequently experiences  feelings of overwhelm that are difficult to cope with Objective Andrea Colon would like to develop coping strategies and reduce the frequency of meltdowns  Target Date: 2024-03-26 Frequency: Weekly  Progress: 0 Modality: individual  Related Interventions Andrea Colon will be provided opportunities to process experiences in sessions Therapist will provide referrals for additional resources as appropriate  Therapist will help Andrea Colon to identify and disengage from maladaptive thoughts and behaviors using CBT-based strategies Therapist will provide emotion regulation strategies including mindfulness, relaxation, and self care techniques Diagnosis Axis none 300.02 (Generalized anxiety disorder) - Open - [Signifier: n/a]    Axis none 314.01 (Attention deficit disorder with hyperactivity) - Open - [Signifier: n/a]    Conditions For Discharge Achievement of treatment goals and objectives       Andrea [name], Andrea Colon               Andrea [name], Andrea Colon               Andrea [name], Andrea Colon

## 2023-06-02 ENCOUNTER — Encounter: Payer: Self-pay | Admitting: Family Medicine

## 2023-06-02 DIAGNOSIS — R5383 Other fatigue: Secondary | ICD-10-CM

## 2023-06-04 ENCOUNTER — Telehealth: Payer: Self-pay | Admitting: *Deleted

## 2023-06-04 DIAGNOSIS — E538 Deficiency of other specified B group vitamins: Secondary | ICD-10-CM

## 2023-06-04 DIAGNOSIS — E781 Pure hyperglyceridemia: Secondary | ICD-10-CM

## 2023-06-04 DIAGNOSIS — Z1159 Encounter for screening for other viral diseases: Secondary | ICD-10-CM

## 2023-06-04 DIAGNOSIS — Z862 Personal history of diseases of the blood and blood-forming organs and certain disorders involving the immune mechanism: Secondary | ICD-10-CM

## 2023-06-04 DIAGNOSIS — R7303 Prediabetes: Secondary | ICD-10-CM

## 2023-06-04 DIAGNOSIS — E559 Vitamin D deficiency, unspecified: Secondary | ICD-10-CM

## 2023-06-04 NOTE — Telephone Encounter (Signed)
-----   Message from Alvina Chou sent at 06/02/2023 10:29 AM EDT ----- Regarding: Lab orders for Friday, 6.21.24 Patient is scheduled for CPX labs, please order future labs, Thanks , Camelia Eng

## 2023-06-05 ENCOUNTER — Ambulatory Visit (INDEPENDENT_AMBULATORY_CARE_PROVIDER_SITE_OTHER): Payer: 59 | Admitting: Clinical

## 2023-06-05 DIAGNOSIS — F411 Generalized anxiety disorder: Secondary | ICD-10-CM

## 2023-06-05 DIAGNOSIS — F902 Attention-deficit hyperactivity disorder, combined type: Secondary | ICD-10-CM | POA: Diagnosis not present

## 2023-06-05 DIAGNOSIS — F331 Major depressive disorder, recurrent, moderate: Secondary | ICD-10-CM | POA: Diagnosis not present

## 2023-06-05 MED ORDER — ZOLPIDEM TARTRATE 10 MG PO TABS: 5.0000 mg | ORAL_TABLET | Freq: Every evening | ORAL | 1 refills | Status: DC | PRN
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
## 2023-06-08 ENCOUNTER — Other Ambulatory Visit: Payer: Self-pay | Admitting: Family Medicine

## 2023-06-09 ENCOUNTER — Ambulatory Visit (INDEPENDENT_AMBULATORY_CARE_PROVIDER_SITE_OTHER): Payer: 59 | Admitting: Clinical

## 2023-06-09 DIAGNOSIS — F411 Generalized anxiety disorder: Secondary | ICD-10-CM

## 2023-06-09 DIAGNOSIS — F331 Major depressive disorder, recurrent, moderate: Secondary | ICD-10-CM | POA: Diagnosis not present

## 2023-06-09 DIAGNOSIS — F902 Attention-deficit hyperactivity disorder, combined type: Secondary | ICD-10-CM

## 2023-06-09 NOTE — Progress Notes (Signed)
Time: 8:00 am-9:02 am CPT Code: 16109U 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  Session focused on continuing to explore her concept of herself as broken. She shared details of her traumatic experiences and therapist explored her narrative with her, offering reframes and alternate perspectives. Session concluded with a deep breathing exercise, and therapist encouraged her to use coping strategies regularly in the coming week. She is scheduled to be seen again in one week.  General Behavior: WNL Attire: WNL Gait: WNL Motor Activity: WNL Stream of Thought - Productivity: WNL Stream of thought - Progression: WNL Stream of thought - Language:  WNL Emotional tone and reactions - Mood: WNL  Emotional tone and reactions - Affect: WNL Mental trend/Content of thoughts - Perception: WNL  Mental trend/Content of thoughts - Orientation: WNL Mental trend/Content of thoughts - Memory: WNL Mental trend/Content of thoughts - General knowledge: WNL  Insight: WNL Judgment: WNL Intelligence: WNL Mental Status Comment: Client appears oriented to time and space. 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 She expresses herself well in writing.  Client Treatment Preferences  [name] prefers in-person sessions, during school hours  Client Statement of Needs  [name] shared that she is seeking parenting skills, as well as healthier coping skills.  Treatment Level  Weekly  Symptoms  Anxiety : restlessness, difficulty relaxing, racing thoughts, difficulty concentrating, difficulty with creating and adhering to plans and routines (Status: maintained). Depression: tearfulness (Status: maintained).  Problems Addressed  New Description, New Description  Goals 1. [name] would like to process past experiences in order to better  understand how they impact her at present Objective [name] will increase ability to connect to and understand emotional experiences, especially those related to past traumas  Target Date: 2024-03-26 Frequency: Weekly  Progress: 0 Modality: individual  Related Interventions Therapist will help [name] to consider organizational strategies in her day-to-day life, incorporating manualized treatments such as [name] Therapist will provide opportunities to process parenting experiences in session and incorporate strategies from manualized parent training programs 2. [name] frequently experiences feelings of overwhelm that are difficult to cope with Objective [name] would like to develop coping strategies and reduce the frequency of meltdowns  Target Date: 2024-03-26 Frequency: Weekly  Progress: 0 Modality: individual  Related Interventions Hollister will be provided opportunities to process experiences in sessions Therapist will provide referrals for additional resources as appropriate  Therapist will help [name] to identify and disengage from maladaptive thoughts and behaviors using CBT-based strategies Therapist will provide emotion regulation strategies including mindfulness, relaxation, and self care techniques Diagnosis Axis none 300.02 (Generalized anxiety disorder) - Open - [Signifier: n/a]    Axis none 314.01 (Attention deficit disorder with hyperactivity) - Open - [Signifier: n/a]    Conditions For Discharge Achievement of treatment goals and objectives     [name], PhD               [name], PhD               [name], PhD

## 2023-06-09 NOTE — Telephone Encounter (Signed)
Last office visit 04/15/23 for Depression, GAD and Chronic Insomnia.  Last refilled 04/15/2023 for #35 with no refills.  Next appt: 06/20/2023 for CPE.

## 2023-06-10 ENCOUNTER — Ambulatory Visit: Payer: 59 | Admitting: Clinical

## 2023-06-11 MED ORDER — CLONAZEPAM 0.5 MG PO TABS: 0.5000 mg | ORAL_TABLET | Freq: Every day | ORAL | 0 refills | Status: DC | PRN

## 2023-06-12 ENCOUNTER — Ambulatory Visit: Payer: 59 | Admitting: Clinical

## 2023-06-13 ENCOUNTER — Other Ambulatory Visit (INDEPENDENT_AMBULATORY_CARE_PROVIDER_SITE_OTHER): Payer: 59

## 2023-06-13 DIAGNOSIS — Z862 Personal history of diseases of the blood and blood-forming organs and certain disorders involving the immune mechanism: Secondary | ICD-10-CM | POA: Diagnosis not present

## 2023-06-13 DIAGNOSIS — R7303 Prediabetes: Secondary | ICD-10-CM

## 2023-06-13 DIAGNOSIS — Z1159 Encounter for screening for other viral diseases: Secondary | ICD-10-CM

## 2023-06-13 DIAGNOSIS — E559 Vitamin D deficiency, unspecified: Secondary | ICD-10-CM

## 2023-06-13 DIAGNOSIS — E781 Pure hyperglyceridemia: Secondary | ICD-10-CM | POA: Diagnosis not present

## 2023-06-13 DIAGNOSIS — R5383 Other fatigue: Secondary | ICD-10-CM | POA: Diagnosis not present

## 2023-06-13 LAB — COMPREHENSIVE METABOLIC PANEL
ALT: 9 U/L (ref 0–35)
AST: 11 U/L (ref 0–37)
Albumin: 4.1 g/dL (ref 3.5–5.2)
Alkaline Phosphatase: 43 U/L (ref 39–117)
BUN: 12 mg/dL (ref 6–23)
CO2: 32 mEq/L (ref 19–32)
Calcium: 8.9 mg/dL (ref 8.4–10.5)
Chloride: 99 mEq/L (ref 96–112)
Creatinine, Ser: 0.82 mg/dL (ref 0.40–1.20)
GFR: 85.02 mL/min (ref >60.00)
Glucose, Bld: 90 mg/dL (ref 70–99)
Potassium: 3.9 mEq/L (ref 3.5–5.1)
Sodium: 138 mEq/L (ref 135–145)
Total Bilirubin: 0.6 mg/dL (ref 0.2–1.2)
Total Protein: 6.8 g/dL (ref 6.0–8.3)

## 2023-06-13 LAB — LIPID PANEL
Cholesterol: 170 mg/dL (ref 0–200)
HDL: 40.1 mg/dL (ref >39.00)
LDL Cholesterol: 100 mg/dL — ABNORMAL HIGH (ref 0–99)
NonHDL: 130.28
Total CHOL/HDL Ratio: 4
Triglycerides: 149 mg/dL (ref 0.0–149.0)
VLDL: 29.8 mg/dL (ref 0.0–40.0)

## 2023-06-13 LAB — CBC WITH DIFFERENTIAL/PLATELET
Basophils Absolute: 0 10*3/uL (ref 0.0–0.1)
Basophils Relative: 0.4 % (ref 0.0–3.0)
Eosinophils Absolute: 0.2 10*3/uL (ref 0.0–0.7)
Eosinophils Relative: 2.4 % (ref 0.0–5.0)
HCT: 39.7 % (ref 36.0–46.0)
Hemoglobin: 13.4 g/dL (ref 12.0–15.0)
Lymphocytes Relative: 17.9 % (ref 12.0–46.0)
Lymphs Abs: 1.7 10*3/uL (ref 0.7–4.0)
MCHC: 33.7 g/dL (ref 30.0–36.0)
MCV: 88.3 fl (ref 78.0–100.0)
Monocytes Absolute: 0.5 10*3/uL (ref 0.1–1.0)
Monocytes Relative: 5.8 % (ref 3.0–12.0)
Neutro Abs: 6.9 10*3/uL (ref 1.4–7.7)
Neutrophils Relative %: 73.5 % (ref 43.0–77.0)
Platelets: 348 10*3/uL (ref 150.0–400.0)
RBC: 4.5 Mil/uL (ref 3.87–5.11)
RDW: 12.9 % (ref 11.5–15.5)
WBC: 9.4 10*3/uL (ref 4.0–10.5)

## 2023-06-13 LAB — TSH: TSH: 2.48 u[IU]/mL (ref 0.35–5.50)

## 2023-06-13 LAB — IBC + FERRITIN
Ferritin: 24.5 ng/mL (ref 10.0–291.0)
Iron: 135 ug/dL (ref 42–145)
Saturation Ratios: 37.1 % (ref 20.0–50.0)
TIBC: 364 ug/dL (ref 250.0–450.0)
Transferrin: 260 mg/dL (ref 212.0–360.0)

## 2023-06-13 LAB — T3, FREE: T3, Free: 3.1 pg/mL (ref 2.3–4.2)

## 2023-06-13 LAB — FOLATE: Folate: 23.6 ng/mL (ref >5.9)

## 2023-06-13 LAB — VITAMIN B12: Vitamin B-12: 516 pg/mL (ref 211–911)

## 2023-06-13 LAB — T4, FREE: Free T4: 0.75 ng/dL (ref 0.60–1.60)

## 2023-06-13 LAB — HEMOGLOBIN A1C: Hgb A1c MFr Bld: 5.3 % (ref 4.6–6.5)

## 2023-06-13 LAB — VITAMIN D 25 HYDROXY (VIT D DEFICIENCY, FRACTURES): VITD: 26.85 ng/mL — ABNORMAL LOW (ref 30.00–100.00)

## 2023-06-14 LAB — HEPATITIS C ANTIBODY: Hepatitis C Ab: NONREACTIVE

## 2023-06-16 NOTE — Progress Notes (Signed)
No critical labs need to be addressed urgently. We will discuss labs in detail at upcoming office visit.   

## 2023-06-19 ENCOUNTER — Ambulatory Visit (INDEPENDENT_AMBULATORY_CARE_PROVIDER_SITE_OTHER): Payer: 59 | Admitting: Clinical

## 2023-06-19 DIAGNOSIS — F331 Major depressive disorder, recurrent, moderate: Secondary | ICD-10-CM | POA: Diagnosis not present

## 2023-06-19 DIAGNOSIS — F902 Attention-deficit hyperactivity disorder, combined type: Secondary | ICD-10-CM

## 2023-06-19 DIAGNOSIS — F411 Generalized anxiety disorder: Secondary | ICD-10-CM | POA: Diagnosis not present
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
## 2023-06-20 ENCOUNTER — Ambulatory Visit (INDEPENDENT_AMBULATORY_CARE_PROVIDER_SITE_OTHER): Payer: 59 | Admitting: Family Medicine

## 2023-06-20 ENCOUNTER — Encounter: Payer: Self-pay | Admitting: Family Medicine

## 2023-06-20 VITALS — BP 108/78 | HR 95 | Temp 98.0°F | Ht 64.0 in | Wt 228.1 lb

## 2023-06-20 DIAGNOSIS — I10 Essential (primary) hypertension: Secondary | ICD-10-CM | POA: Diagnosis not present

## 2023-06-20 DIAGNOSIS — R7303 Prediabetes: Secondary | ICD-10-CM

## 2023-06-20 DIAGNOSIS — Z6835 Body mass index (BMI) 35.0-35.9, adult: Secondary | ICD-10-CM

## 2023-06-20 DIAGNOSIS — F330 Major depressive disorder, recurrent, mild: Secondary | ICD-10-CM

## 2023-06-20 DIAGNOSIS — Z0000 Encounter for general adult medical examination without abnormal findings: Secondary | ICD-10-CM

## 2023-06-20 DIAGNOSIS — E781 Pure hyperglyceridemia: Secondary | ICD-10-CM | POA: Diagnosis not present

## 2023-06-20 NOTE — Assessment & Plan Note (Addendum)
Chronic, moderate control on current regimen of Lexapro 10 mg daily, lamictal 50 mg daily  Clonazepam 0.5 mg 1-2 tablets daily prn, using ambein  5-10 mg daily prn insomnia

## 2023-06-20 NOTE — Assessment & Plan Note (Signed)
Improved with lifestyle changes. 

## 2023-06-20 NOTE — Assessment & Plan Note (Signed)
Chronic, improving control with weight loss using spironolactone for alternate reason ( acne) but is likely helping with blood pressure control. BP Readings from Last 3 Encounters:  06/20/23 108/78  04/15/23 110/76  01/17/23 110/74

## 2023-06-20 NOTE — Assessment & Plan Note (Signed)
Improved control with weight loss and lifestyle changes. 

## 2023-06-20 NOTE — Progress Notes (Addendum)
Patient ID: Andrea Colon, female    DOB: Sep 18, 1976, 47 y.o.   MRN: [id number]  This visit was conducted in person.  BP 108/78 (BP Location: Left Arm, Patient Position: Sitting, Cuff Size: Large)   Pulse 95   Temp 98 F (36.7 C) (Temporal)   Ht 5\' 4"  (1.626 m)   Wt 228 lb 2 oz (103.5 kg)   LMP 06/19/2023   SpO2 96%   BMI 39.16 kg/m    CC:  Chief Complaint  Patient presents with   Annual Exam    Subjective:   HPI: Andrea Colon is a 47 y.o. female presenting on 06/20/2023 for Annual Exam   Vit D:  low.. she has now restarted.  Prediabetes  Lab Results  Component Value Date   HGBA1C 5.3 06/13/2023   Hypertension:    Well controlled. On spironolactone 50 mg daily  for acne . BP Readings from Last 3 Encounters:  06/20/23 108/78  04/15/23 110/76  01/17/23 110/74  Using medication without problems or lightheadedness:   occ Chest pain with exertion: none Edema: none Short of breath: none Average home BPs: Other issues:  The 10-year ASCVD risk score (Arnett DK, et al., 2019) is: 1.1%   Values used to calculate the score:     Age: 29 years     Sex: Female     Is Non-Hispanic African American: No     Diabetic: No     Tobacco smoker: No     Systolic Blood Pressure: 108 mmHg     Is BP treated: Yes     HDL Cholesterol: 40.1 mg/dL     Total Cholesterol: 170 mg/dL    Wt Readings from Last 3 Encounters:  06/20/23 228 lb 2 oz (103.5 kg)  04/15/23 222 lb 4 oz (100.8 kg)  01/17/23 214 lb (97.1 kg)  Body mass index is 39.16 kg/m.       GAD, MDD,  chronic insomnia ADD:  Good control on lexapro 10 mg daily, vyvanse 40 mg daily/ adderall 10 mg BID prn., lamictal 50 mg daily  Clonazepam 0.5 mg 1-2 tablets daily prn, using ambein  5-10 mg daily prn insomnia    She continues to be tired. Feels it could be her lamictal dose vs hormone fluctuations. Working with Dr. Earlene Plater to adjust.  Relevant past medical, surgical, family and social history reviewed and  updated as indicated. Interim medical history since our last visit reviewed. Allergies and medications reviewed and updated. Outpatient Medications Prior to Visit  Medication Sig Dispense Refill   amphetamine-dextroamphetamine (ADDERALL XR) 20 MG 24 hr capsule Take 20 mg by mouth daily.     Bioflavonoid Products (ESTER-C PO) Take 1,000 mg by mouth daily.     clonazePAM (KLONOPIN) 0.5 MG tablet Take 1-2 tablets (0.5-1 mg total) by mouth daily as needed for anxiety. 35 tablet 0   escitalopram (LEXAPRO) 10 MG tablet Take 10 mg by mouth daily.     lamoTRIgine (LAMICTAL) 25 MG tablet Take 50 mg by mouth daily.     LINZESS 145 MCG CAPS capsule Take 145 mcg by mouth daily.     MAGNESIUM PO Take 1 tablet by mouth in the morning and at bedtime.     meloxicam (MOBIC) 15 MG tablet Take 15 mg by mouth daily.     Omega-3 Fatty Acids (FISH OIL PO) Take 2,200 mg by mouth daily.     spironolactone (ALDACTONE) 50 MG tablet Take 50 mg by mouth 2 (two) times  daily.     tretinoin (RETIN-A) 0.025 % cream as needed.     Vitamin D, Cholecalciferol, 25 MCG (1000 UT) TABS Take 4 tablets by mouth daily.     VYVANSE 60 MG capsule Take 60 mg by mouth every morning.     zolpidem (AMBIEN) 10 MG tablet Take 0.5-1 tablets (5-10 mg total) by mouth at bedtime as needed for sleep. 15 tablet 1   VYVANSE 50 MG capsule Take 50 mg by mouth every morning.     No facility-administered medications prior to visit.     Per HPI unless specifically indicated in ROS section below Review of Systems  Constitutional:  Negative for fatigue and fever.  HENT:  Negative for congestion.   Eyes:  Negative for pain.  Respiratory:  Negative for cough and shortness of breath.   Cardiovascular:  Negative for chest pain, palpitations and leg swelling.  Gastrointestinal:  Negative for abdominal pain.  Genitourinary:  Negative for dysuria and vaginal bleeding.  Musculoskeletal:  Negative for back pain.  Neurological:  Negative for syncope,  light-headedness and headaches.  Psychiatric/Behavioral:  Negative for dysphoric mood.    Objective:  BP 108/78 (BP Location: Left Arm, Patient Position: Sitting, Cuff Size: Large)   Pulse 95   Temp 98 F (36.7 C) (Temporal)   Ht 5\' 4"  (1.626 m)   Wt 228 lb 2 oz (103.5 kg)   LMP 06/19/2023   SpO2 96%   BMI 39.16 kg/m   Wt Readings from Last 3 Encounters:  06/20/23 228 lb 2 oz (103.5 kg)  04/15/23 222 lb 4 oz (100.8 kg)  01/17/23 214 lb (97.1 kg)      Physical Exam Vitals and nursing note reviewed.  Constitutional:      General: She is not in acute distress.    Appearance: Normal appearance. She is well-developed. She is not ill-appearing or toxic-appearing.  HENT:     Head: Normocephalic.     Right Ear: Hearing, tympanic membrane, ear canal and external ear normal.     Left Ear: Hearing, tympanic membrane, ear canal and external ear normal.     Nose: Nose normal.  Eyes:     General: Lids are normal. Lids are everted, no foreign bodies appreciated.     Conjunctiva/sclera: Conjunctivae normal.     Pupils: Pupils are equal, round, and reactive to light.  Neck:     Thyroid: No thyroid mass or thyromegaly.     Vascular: No carotid bruit.     Trachea: Trachea normal.  Cardiovascular:     Rate and Rhythm: Normal rate and regular rhythm.     Heart sounds: Normal heart sounds, S1 normal and S2 normal. No murmur heard.    No gallop.  Pulmonary:     Effort: Pulmonary effort is normal. No respiratory distress.     Breath sounds: Normal breath sounds. No wheezing, rhonchi or rales.  Abdominal:     General: Bowel sounds are normal. There is no distension or abdominal bruit.     Palpations: Abdomen is soft. There is no fluid wave or mass.     Tenderness: There is no abdominal tenderness. There is no guarding or rebound.     Hernia: No hernia is present.  Musculoskeletal:     Cervical back: Normal range of motion and neck supple.  Lymphadenopathy:     Cervical: No cervical  adenopathy.  Skin:    General: Skin is warm and dry.     Findings: No rash.  Neurological:  Mental Status: She is alert.     Cranial Nerves: No cranial nerve deficit.     Sensory: No sensory deficit.  Psychiatric:        Mood and Affect: Mood is not anxious or depressed.        Speech: Speech normal.        Behavior: Behavior normal. Behavior is cooperative.        Judgment: Judgment normal.       Results for orders placed or performed in visit on 06/13/23  Folate  Result Value Ref Range   Folate 23.6 >5.9 ng/mL  T3, free  Result Value Ref Range   T3, Free 3.1 2.3 - 4.2 pg/mL  Vitamin B12  Result Value Ref Range   Vitamin B-12 516 211 - 911 pg/mL  T4, free  Result Value Ref Range   Free T4 0.75 0.60 - 1.60 ng/dL  TSH  Result Value Ref Range   TSH 2.48 0.35 - 5.50 uIU/mL  VITAMIN D 25 Hydroxy (Vit-D Deficiency, Fractures)  Result Value Ref Range   VITD 26.85 (L) 30.00 - 100.00 ng/mL  IBC + Ferritin  Result Value Ref Range   Iron 135 42 - 145 ug/dL   Transferrin 161.0 960.4 - 360.0 mg/dL   Saturation Ratios 54.0 20.0 - 50.0 %   Ferritin 24.5 10.0 - 291.0 ng/mL   TIBC 364.0 250.0 - 450.0 mcg/dL  Hemoglobin J8J  Result Value Ref Range   Hgb A1c MFr Bld 5.3 4.6 - 6.5 %  Lipid panel  Result Value Ref Range   Cholesterol 170 0 - 200 mg/dL   Triglycerides 191.4 0.0 - 149.0 mg/dL   HDL 78.29 >56.21 mg/dL   VLDL 30.8 0.0 - 65.7 mg/dL   LDL Cholesterol 846 (H) 0 - 99 mg/dL   Total CHOL/HDL Ratio 4    NonHDL 130.28   Hepatitis C antibody  Result Value Ref Range   Hepatitis C Ab NON-REACTIVE NON-REACTIVE  Comprehensive metabolic panel  Result Value Ref Range   Sodium 138 135 - 145 mEq/L   Potassium 3.9 3.5 - 5.1 mEq/L   Chloride 99 96 - 112 mEq/L   CO2 32 19 - 32 mEq/L   Glucose, Bld 90 70 - 99 mg/dL   BUN 12 6 - 23 mg/dL   Creatinine, Ser 9.62 0.40 - 1.20 mg/dL   Total Bilirubin 0.6 0.2 - 1.2 mg/dL   Alkaline Phosphatase 43 39 - 117 U/L   AST 11 0 - 37 U/L    ALT 9 0 - 35 U/L   Total Protein 6.8 6.0 - 8.3 g/dL   Albumin 4.1 3.5 - 5.2 g/dL   GFR 95.28 >41.32 mL/min   Calcium 8.9 8.4 - 10.5 mg/dL  CBC with Differential/Platelet  Result Value Ref Range   WBC 9.4 4.0 - 10.5 K/uL   RBC 4.50 3.87 - 5.11 Mil/uL   Hemoglobin 13.4 12.0 - 15.0 g/dL   HCT 44.0 10.2 - 72.5 %   MCV 88.3 78.0 - 100.0 fl   MCHC 33.7 30.0 - 36.0 g/dL   RDW 36.6 44.0 - 34.7 %   Platelets 348.0 150.0 - 400.0 K/uL   Neutrophils Relative % 73.5 43.0 - 77.0 %   Lymphocytes Relative 17.9 12.0 - 46.0 %   Monocytes Relative 5.8 3.0 - 12.0 %   Eosinophils Relative 2.4 0.0 - 5.0 %   Basophils Relative 0.4 0.0 - 3.0 %   Neutro Abs 6.9 1.4 - 7.7 K/uL   Lymphs  Abs 1.7 0.7 - 4.0 K/uL   Monocytes Absolute 0.5 0.1 - 1.0 K/uL   Eosinophils Absolute 0.2 0.0 - 0.7 K/uL   Basophils Absolute 0.0 0.0 - 0.1 K/uL     COVID 19 screen:  No recent travel or known exposure to COVID19 The patient denies respiratory symptoms of COVID 19 at this time. The importance of social distancing was discussed today.   Assessment and Plan   The patient's preventative maintenance and recommended screening tests for an annual wellness exam were reviewed in full today. Brought up to date unless services declined.  Counselled on the importance of diet, exercise, and its role in overall health and mortality. The patient's FH and SH was reviewed, including their home life, tobacco status, and drug and alcohol status.   Vaccines: Last Tdap in  2023, COVID x 2 STD screen: refused Colon: no early family history.Marland KitchenMarland Kitchen9/15/2023 Mammogram: 5-06/2022 PAP/DVE:  nml pap, HPV neg GYN 08/2022  No ETOH, no smoking  Hep C due  Problem List Items Addressed This Visit     Class 2 severe obesity due to excess calories with serious comorbidity and body mass index (BMI) of 35.0 to 35.9 in adult [facility])   Relevant Medications   VYVANSE 60 MG capsule   Depression, major, recurrent, mild (HCC) (Chronic)    Chronic,  moderate control on current regimen of Lexapro 10 mg daily, lamictal 50 mg daily  Clonazepam 0.5 mg 1-2 tablets daily prn, using ambein  5-10 mg daily prn insomnia       Essential hypertension, benign    Chronic, improving control with weight loss using spironolactone for alternate reason ( acne) but is likely helping with blood pressure control. BP Readings from Last 3 Encounters:  06/20/23 108/78  04/15/23 110/76  01/17/23 110/74        High triglycerides    Improved with lifestyle changes!      Prediabetes    Improved control with weight loss and lifestyle changes.      Other Visit Diagnoses     Routine general medical examination at a health care facility    -  Primary       Kerby Nora, MD

## 2023-06-24 ENCOUNTER — Ambulatory Visit (INDEPENDENT_AMBULATORY_CARE_PROVIDER_SITE_OTHER): Payer: 59 | Admitting: Clinical

## 2023-06-24 DIAGNOSIS — F331 Major depressive disorder, recurrent, moderate: Secondary | ICD-10-CM

## 2023-06-24 DIAGNOSIS — F411 Generalized anxiety disorder: Secondary | ICD-10-CM | POA: Diagnosis not present

## 2023-06-24 DIAGNOSIS — F902 Attention-deficit hyperactivity disorder, combined type: Secondary | ICD-10-CM | POA: Diagnosis not present

## 2023-06-24 NOTE — Progress Notes (Signed)
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 She was in her home and therapist was in her home at the time of the appointment. Client is aware of risks of telehealth and consented to a virtual visit. Session focused on dynamics in her relationships and recent triggering of her sense of abandonment. Suicidal ideation and intent were denied and she endorsed no self harm in the past week. Andrea Colon voiced concerns for increased depression if she goes off of lexapro due to side effects, and therapist suggested creating a plan to follow. She is scheduled to be seen again in one week.  General Behavior: WNL Attire: WNL Gait: WNL Motor Activity: WNL Stream of Thought - Productivity: WNL Stream of thought - Progression: WNL Stream of thought - Language:  WNL Emotional tone and reactions - Mood: WNL  Emotional tone and reactions - Affect: WNL Mental trend/Content of thoughts - Perception: WNL  Mental trend/Content of thoughts - Orientation: WNL Mental trend/Content of thoughts - Memory: WNL Mental trend/Content of thoughts - General knowledge: WNL  Insight: WNL Judgment: WNL Intelligence: WNL Mental Status Comment: Client appears oriented to time and space. 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  Client Treatment Preferences  Andrea Colon prefers in-person sessions, during school hours  Client Statement of Needs  Andrea Colon shared that she is seeking parenting skills, as well as healthier coping skills.  Treatment Level  Weekly  Symptoms  Anxiety : restlessness, difficulty relaxing, racing thoughts, difficulty concentrating, difficulty with creating and adhering to plans and routines (Status: maintained). Depression: tearfulness (Status:  maintained).  Problems Addressed  New Description, New Description  Goals 1. Andrea Colon would like to process past experiences in order to better understand how they impact her at present Objective Andrea Colon will increase ability to connect to and understand emotional experiences, especially those related to past traumas  Target Date: 2024-03-26 Frequency: Weekly  Progress: 0 Modality: individual  Related Interventions Therapist will help Andrea Colon to consider organizational strategies in her day-to-day life, incorporating manualized treatments such as [name] Therapist will provide opportunities to process parenting experiences in session and incorporate strategies from manualized parent training programs 2. Andrea Colon frequently experiences feelings of overwhelm that are difficult to cope with Objective Andrea Colon would like to develop coping strategies and reduce the frequency of meltdowns  Target Date: 2024-03-26 Frequency: Weekly  Progress: 0 Modality: individual  Related Interventions Andrea Colon will be provided opportunities to process experiences in sessions Therapist will provide referrals for additional resources as appropriate  Therapist will help Andrea Colon to identify and disengage from maladaptive thoughts and behaviors using CBT-based strategies Therapist will provide emotion regulation strategies including mindfulness, relaxation, and self care techniques Diagnosis Axis none 300.02 (Generalized anxiety disorder) - Open - [Signifier: n/a]    Axis none 314.01 (Attention deficit disorder with hyperactivity) - Open - [Signifier: n/a]    Conditions For Discharge Achievement of treatment goals and objectives     Andrea [name], PhD               Andrea [name], PhD                Andrea [name], PhD               Andrea [name], PhD

## 2023-06-25 ENCOUNTER — Other Ambulatory Visit: Payer: Self-pay | Admitting: Family Medicine

## 2023-06-25 DIAGNOSIS — Z1231 Encounter for screening mammogram for malignant neoplasm of breast: Secondary | ICD-10-CM

## 2023-07-01 ENCOUNTER — Ambulatory Visit (INDEPENDENT_AMBULATORY_CARE_PROVIDER_SITE_OTHER): Payer: 59 | Admitting: Clinical

## 2023-07-01 ENCOUNTER — Encounter: Payer: Self-pay | Admitting: Family Medicine

## 2023-07-01 DIAGNOSIS — F902 Attention-deficit hyperactivity disorder, combined type: Secondary | ICD-10-CM

## 2023-07-01 DIAGNOSIS — F411 Generalized anxiety disorder: Secondary | ICD-10-CM

## 2023-07-01 DIAGNOSIS — F331 Major depressive disorder, recurrent, moderate: Secondary | ICD-10-CM | POA: Diagnosis not present
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
## 2023-07-02 MED ORDER — ZALEPLON 5 MG PO CAPS: 5.0000 mg | ORAL_CAPSULE | Freq: Every evening | ORAL | 0 refills | Status: DC | PRN

## 2023-07-02 NOTE — Telephone Encounter (Signed)
m °

## 2023-07-17 ENCOUNTER — Ambulatory Visit: Payer: 59 | Admitting: Clinical

## 2023-07-17 DIAGNOSIS — F902 Attention-deficit hyperactivity disorder, combined type: Secondary | ICD-10-CM | POA: Diagnosis not present

## 2023-07-17 DIAGNOSIS — F331 Major depressive disorder, recurrent, moderate: Secondary | ICD-10-CM

## 2023-07-17 DIAGNOSIS — F411 Generalized anxiety disorder: Secondary | ICD-10-CM | POA: Diagnosis not present

## 2023-07-17 NOTE — Progress Notes (Signed)
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 She reported having engaged in self harm twice in the past few weeks. Session focused on review of REST skills and exploring events that had led to self-harm. Andrea Colon had also completed homework of writing a letter to her mother. For homework, she will try practicing REST skills if she feels an urge to self harm. She is scheduled to be seen again in one week.  General Behavior: WNL Attire: WNL Gait: WNL Motor Activity: WNL Stream of Thought - Productivity: WNL Stream of thought - Progression: WNL Stream of thought - Language:  WNL Emotional tone and reactions - Mood: WNL  Emotional tone and reactions - Affect: WNL Mental trend/Content of thoughts - Perception: WNL  Mental trend/Content of thoughts - Orientation: WNL Mental trend/Content of thoughts - Memory: WNL Mental trend/Content of thoughts - General knowledge: WNL  Insight: WNL Judgment: WNL Intelligence: WNL Mental Status Comment: Client appears oriented to time and space. 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  Client Treatment Preferences  Andrea Colon prefers in-person sessions, during school hours  Client Statement of Needs  Andrea Colon shared that she is seeking parenting skills, as well as healthier coping skills.  Treatment Level  Weekly  Symptoms  Anxiety : restlessness, difficulty relaxing, racing thoughts, difficulty concentrating, difficulty with creating and adhering to plans and routines (Status: maintained). Depression: tearfulness (Status: maintained).  Problems Addressed  New Description, New Description  Goals 1. Andrea Colon would like to process past experiences in order to better understand how they impact her at  present Objective Andrea Colon will increase ability to connect to and understand emotional experiences, especially those related to past traumas  Target Date: 2024-03-26 Frequency: Weekly  Progress: 0 Modality: individual  Related Interventions Therapist will help Andrea Colon to consider organizational strategies in her day-to-day life, incorporating manualized treatments such as [name] Therapist will provide opportunities to process parenting experiences in session and incorporate strategies from manualized parent training programs 2. Andrea Colon frequently experiences feelings of overwhelm that are difficult to cope with Objective Andrea Colon would like to develop coping strategies and reduce the frequency of meltdowns  Target Date: 2024-03-26 Frequency: Weekly  Progress: 0 Modality: individual  Related Interventions Andrea Colon will be provided opportunities to process experiences in sessions Therapist will provide referrals for additional resources as appropriate  Therapist will help Andrea Colon to identify and disengage from maladaptive thoughts and behaviors using CBT-based strategies Therapist will provide emotion regulation strategies including mindfulness, relaxation, and self care techniques Diagnosis Axis none 300.02 (Generalized anxiety disorder) - Open - [Signifier: n/a]    Axis none 314.01 (Attention deficit disorder with hyperactivity) - Open - [Signifier: n/a]    Conditions For Discharge Achievement of treatment goals and objectives      [name], PhD               [name], PhD

## 2023-07-20 ENCOUNTER — Other Ambulatory Visit: Payer: Self-pay | Admitting: Family Medicine

## 2023-07-21 MED ORDER — CLONAZEPAM 0.5 MG PO TABS: 0.5000 mg | ORAL_TABLET | Freq: Every day | ORAL | 0 refills | Status: DC | PRN

## 2023-07-21 NOTE — Telephone Encounter (Signed)
Last office visit 06/20/2023 for CPE.  Last refilled 06/11/2023 for #35 with no refills.  Next Appt: No future appointments.

## 2023-07-24 ENCOUNTER — Ambulatory Visit (INDEPENDENT_AMBULATORY_CARE_PROVIDER_SITE_OTHER): Payer: 59 | Admitting: Clinical

## 2023-07-24 DIAGNOSIS — F411 Generalized anxiety disorder: Secondary | ICD-10-CM | POA: Diagnosis not present

## 2023-07-24 DIAGNOSIS — F902 Attention-deficit hyperactivity disorder, combined type: Secondary | ICD-10-CM

## 2023-07-24 DIAGNOSIS — F331 Major depressive disorder, recurrent, moderate: Secondary | ICD-10-CM | POA: Diagnosis not present
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
## 2023-07-27 ENCOUNTER — Other Ambulatory Visit: Payer: Self-pay | Admitting: Family Medicine

## 2023-07-28 NOTE — Telephone Encounter (Signed)
Hi! Can this one please also go back to [facility] in [place]? Sorry- its still not a choice for me. Thanks

## 2023-07-29 MED ORDER — ZALEPLON 5 MG PO CAPS: 5.0000 mg | ORAL_CAPSULE | Freq: Every evening | ORAL | 0 refills | Status: DC | PRN

## 2023-07-31 ENCOUNTER — Ambulatory Visit (INDEPENDENT_AMBULATORY_CARE_PROVIDER_SITE_OTHER): Payer: 59 | Admitting: Clinical

## 2023-07-31 DIAGNOSIS — F902 Attention-deficit hyperactivity disorder, combined type: Secondary | ICD-10-CM

## 2023-07-31 DIAGNOSIS — F411 Generalized anxiety disorder: Secondary | ICD-10-CM | POA: Diagnosis not present

## 2023-07-31 DIAGNOSIS — F331 Major depressive disorder, recurrent, moderate: Secondary | ICD-10-CM
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
## 2023-08-07 ENCOUNTER — Ambulatory Visit: Payer: 59 | Admitting: Clinical

## 2023-08-14 ENCOUNTER — Ambulatory Visit (INDEPENDENT_AMBULATORY_CARE_PROVIDER_SITE_OTHER): Payer: 59 | Admitting: Clinical

## 2023-08-14 DIAGNOSIS — F902 Attention-deficit hyperactivity disorder, combined type: Secondary | ICD-10-CM

## 2023-08-14 DIAGNOSIS — F411 Generalized anxiety disorder: Secondary | ICD-10-CM

## 2023-08-14 DIAGNOSIS — F331 Major depressive disorder, recurrent, moderate: Secondary | ICD-10-CM | POA: Diagnosis not present

## 2023-08-14 NOTE — Progress Notes (Signed)
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 She had engaged in self harm in the past week that consisted of cutting herself on the thigh. Suicidal ideation and intent were denied. Session focused on discussing her recent depressive episode. Therapist provided psychoeducation on behavior activation, and also encouraged Andrea Colon to seek additional support in caring for her son, such as through ABA therapy. Andrea Colon shared that she had signed him up for aftercare at school, and believed her situation would likely improve once the school year started. She is scheduled to be seen again in one week.  General Behavior: WNL Attire: WNL Gait: WNL Motor Activity: WNL Stream of Thought - Productivity: WNL Stream of thought - Progression: WNL Stream of thought - Language:  WNL Emotional tone and reactions - Mood: WNL  Emotional tone and reactions - Affect: WNL Mental trend/Content of thoughts - Perception: WNL  Mental trend/Content of thoughts - Orientation: WNL Mental trend/Content of thoughts - Memory: WNL Mental trend/Content of thoughts - General knowledge: WNL  Insight: WNL Judgment: WNL Intelligence: WNL Mental Status Comment: Client appears oriented to time and space. 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  Client Treatment Preferences  Zachery prefers in-person sessions, during school hours  Client Statement of Needs  Andrea Colon shared that she is seeking parenting skills, as well as healthier coping skills.  Treatment Level  Weekly  Symptoms  Anxiety : restlessness, difficulty relaxing, racing thoughts, difficulty concentrating, difficulty with creating and adhering to plans and routines (Status: maintained). Depression: tearfulness (Status:  maintained).  Problems Addressed  New Description, New Description  Goals 1. Andrea Colon would like to process past experiences in order to better understand how they impact her at present Objective Andrea Colon will increase ability to connect to and understand emotional experiences, especially those related to past traumas  Target Date: 2024-03-26 Frequency: Weekly  Progress: 0 Modality: individual  Related Interventions Therapist will help Andrea Colon to consider organizational strategies in her day-to-day life, incorporating manualized treatments such as [name] Therapist will provide opportunities to process parenting experiences in session and incorporate strategies from manualized parent training programs 2. Andrea Colon frequently experiences feelings of overwhelm that are difficult to cope with Objective Andrea Colon would like to develop coping strategies and reduce the frequency of meltdowns  Target Date: 2024-03-26 Frequency: Weekly  Progress: 0 Modality: individual  Related Interventions Andrea Colon will be provided opportunities to process experiences in sessions Therapist will provide referrals for additional resources as appropriate  Therapist will help Andrea Colon to identify and disengage from maladaptive thoughts and behaviors using CBT-based strategies Therapist will provide emotion regulation strategies including mindfulness, relaxation, and self care techniques Diagnosis Axis none 300.02 (Generalized anxiety disorder) - Open - [Signifier: n/a]    Axis none 314.01 (Attention deficit disorder with hyperactivity) - Open - [Signifier: n/a]    Conditions For Discharge Achievement of treatment goals and objectives  Andrea [name], Andrea Colon               Andrea Colon, PhDTime: 9:00 am-9:59 am CPT Code: [id number] 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 She reported having engaged in self harm once in the past week She reflected  upon a stressful week during which she had struggled with her son and  come into conflict with her husband. Therapist suggested holding off on further processing trauma until her mood is more stable, and strongly encouraged her to consult with her prescribing provider about getting back onto a mood stabilizing medication. Suicidal ideation was endorsed, without plan or intent, and Andrea Colon agreed to go to the hospital if a plan or intent emerge. She assured the therapist that she will be safe. She is scheduled to be seen again in one week.  General Behavior: WNL Attire: WNL Gait: WNL Motor Activity: WNL Stream of Thought - Productivity: WNL Stream of thought - Progression: WNL Stream of thought - Language:  WNL Emotional tone and reactions - Mood: WNL  Emotional tone and reactions - Affect: WNL Mental trend/Content of thoughts - Perception: WNL  Mental trend/Content of thoughts - Orientation: WNL Mental trend/Content of thoughts - Memory: WNL Mental trend/Content of thoughts - General knowledge: WNL  Insight: WNL Judgment: WNL Intelligence: WNL Mental Status Comment: Client appears oriented to time and space. 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  Client Treatment Preferences  Andrea Colon prefers in-person sessions, during school hours  Client Statement of Needs  Andrea Colon shared that she is seeking parenting skills, as well as healthier coping skills.  Treatment Level  Weekly  Symptoms  Anxiety : restlessness, difficulty relaxing, racing thoughts, difficulty concentrating, difficulty with creating and adhering to plans and routines (Status: maintained). Depression: tearfulness (Status: maintained).  Problems Addressed  New Description, New Description  Goals 1. Andrea Colon would like to process past experiences in order to  better understand how they impact her at present Objective Andrea Colon will increase ability to connect to and understand emotional experiences, especially those related to past traumas  Target Date: 2024-03-26 Frequency: Weekly  Progress: 0 Modality: individual  Related Interventions Therapist will help Andrea Colon to consider organizational strategies in her day-to-day life, incorporating manualized treatments such as [name] Therapist will provide opportunities to process parenting experiences in session and incorporate strategies from manualized parent training programs 2. Andrea Colon frequently experiences feelings of overwhelm that are difficult to cope with Objective Andrea Colon would like to develop coping strategies and reduce the frequency of meltdowns  Target Date: 2024-03-26 Frequency: Weekly  Progress: 0 Modality: individual  Related Interventions Andrea Colon will be provided opportunities to process experiences in sessions Therapist will provide referrals for additional resources as appropriate  Therapist will help Andrea Colon to identify and disengage from maladaptive thoughts and behaviors using CBT-based strategies Therapist will provide emotion regulation strategies including mindfulness, relaxation, and self care techniques Diagnosis Axis none 300.02 (Generalized anxiety disorder) - Open - [Signifier: n/a]    Axis none 314.01 (Attention deficit disorder with hyperactivity) - Open - [Signifier: n/a]    Conditions For Discharge Achievement of treatment goals and objectives      Andrea [name], Andrea Colon               Andrea [name], Andrea Colon

## 2023-08-17 ENCOUNTER — Other Ambulatory Visit: Payer: Self-pay | Admitting: Family Medicine

## 2023-08-18 NOTE — Telephone Encounter (Signed)
Last office visit 06/20/2023 for CPE.  Last refilled 07/21/2023 for #35 with no refills.  Next Appt: No future appointments.

## 2023-08-19 ENCOUNTER — Other Ambulatory Visit: Payer: Self-pay | Admitting: Oncology

## 2023-08-19 DIAGNOSIS — Z006 Encounter for examination for normal comparison and control in clinical research program: Secondary | ICD-10-CM

## 2023-08-19 MED ORDER — CLONAZEPAM 0.5 MG PO TABS: 0.5000 mg | ORAL_TABLET | Freq: Every day | ORAL | 0 refills | Status: DC | PRN

## 2023-08-21 ENCOUNTER — Ambulatory Visit (INDEPENDENT_AMBULATORY_CARE_PROVIDER_SITE_OTHER): Payer: 59 | Admitting: Clinical

## 2023-08-21 DIAGNOSIS — F902 Attention-deficit hyperactivity disorder, combined type: Secondary | ICD-10-CM

## 2023-08-21 DIAGNOSIS — F331 Major depressive disorder, recurrent, moderate: Secondary | ICD-10-CM | POA: Diagnosis not present

## 2023-08-21 DIAGNOSIS — F411 Generalized anxiety disorder: Secondary | ICD-10-CM

## 2023-08-21 NOTE — Progress Notes (Signed)
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 She had not engaged in self-harm in the past week. Session focused on processing a discussion she had had with her son's Andrea Colon about her participation in parent training. Andrea Colon offered validation and support, pointing out maladaptive cognitions and suggesting alternate perspectives. Andrea Colon also encouraged Andrea Colon to explore ways she can access additional parenting support, such as by having her son attend an after school care program and considering ABA therapy. She is scheduled to be seen again in one week. General Behavior: WNL Attire: WNL Gait: WNL Motor Activity: WNL Stream of Thought - Productivity: WNL Stream of thought - Progression: WNL Stream of thought - Language:  WNL Emotional tone and reactions - Mood: WNL  Emotional tone and reactions - Affect: WNL Mental trend/Content of thoughts - Perception: WNL  Mental trend/Content of thoughts - Orientation: WNL Mental trend/Content of thoughts - Memory: WNL Mental trend/Content of thoughts - General knowledge: WNL  Insight: WNL Judgment: WNL Intelligence: WNL Mental Status Comment: Client appears oriented to time and space. 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  Client Treatment Preferences  Andrea Colon prefers in-person sessions, during school hours  Client Statement of Needs  Andrea Colon shared that she is seeking parenting skills, as well as healthier coping skills.  Treatment Level  Weekly  Symptoms  Anxiety : restlessness, difficulty relaxing, racing thoughts, difficulty concentrating, difficulty with creating and adhering to plans and routines (Status: maintained). Depression: tearfulness (Status: maintained).  Problems  Addressed  New Description, New Description  Goals 1. Andrea Colon would like to process past experiences in order to better understand how they impact her at present Objective Andrea Colon will increase ability to connect to and understand emotional experiences, especially those related to past traumas  Target Date: 2024-03-26 Frequency: Weekly  Progress: 0 Modality: individual  Related Interventions Andrea Colon will help Andrea Colon to consider organizational strategies in her day-to-day life, incorporating manualized treatments such as [name] Andrea Colon will provide opportunities to process parenting experiences in session and incorporate strategies from manualized parent training programs 2. Andrea Colon frequently experiences feelings of overwhelm that are difficult to cope with Objective Andrea Colon would like to develop coping strategies and reduce the frequency of meltdowns  Target Date: 2024-03-26 Frequency: Weekly  Progress: 0 Modality: individual  Related Interventions Salam will be provided opportunities to process experiences in sessions Andrea Colon will provide referrals for additional resources as appropriate  Andrea Colon will help Andrea Colon to identify and disengage from maladaptive thoughts and behaviors using CBT-based strategies Andrea Colon will provide emotion regulation strategies including mindfulness, relaxation, and self care techniques Diagnosis Axis none 300.02 (Generalized anxiety disorder) - Open - [Signifier: n/a]    Axis none 314.01 (Attention deficit disorder with hyperactivity) - Open - [Signifier: n/a]    Conditions For Discharge Achievement of treatment goals and objectives  [name], PhD               [name], PhDTime: 9:00 am-9:59 am CPT Code: [id number] 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 She reported having engaged in self harm once in the past week She reflected upon a stressful week  during which she had struggled with her son and come into conflict with her husband. Andrea Colon suggested holding off  on further processing trauma until her mood is more stable, and strongly encouraged her to consult with her prescribing provider about getting back onto a mood stabilizing medication. Andrea Colon was endorsed, without plan or intent, and Rahcel agreed to go to the hospital if a plan or intent emerge. She assured the Andrea Colon that she will be safe. She is scheduled to be seen again in one week.  General Behavior: WNL Attire: WNL Gait: WNL Motor Activity: WNL Stream of Thought - Productivity: WNL Stream of thought - Progression: WNL Stream of thought - Language:  WNL Emotional tone and reactions - Mood: WNL  Emotional tone and reactions - Affect: WNL Mental trend/Content of thoughts - Perception: WNL  Mental trend/Content of thoughts - Orientation: WNL Mental trend/Content of thoughts - Memory: WNL Mental trend/Content of thoughts - General knowledge: WNL  Insight: WNL Judgment: WNL Intelligence: WNL Mental Status Comment: Client appears oriented to time and space. 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  Client Treatment Preferences  Andrea Colon prefers in-person sessions, during school hours  Client Statement of Needs  Andrea Colon shared that she is seeking parenting skills, as well as healthier coping skills.  Treatment Level  Weekly  Symptoms  Anxiety : restlessness, difficulty relaxing, racing thoughts, difficulty concentrating, difficulty with creating and adhering to plans and routines (Status: maintained). Depression: tearfulness (Status: maintained).  Problems Addressed  New Description, New Description  Goals 1. Andrea Colon would like to process past experiences in order to better understand how  they impact her at present Objective Andrea Colon will increase ability to connect to and understand emotional experiences, especially those related to past traumas  Target Date: 2024-03-26 Frequency: Weekly  Progress: 0 Modality: individual  Related Interventions Andrea Colon will help Andrea Colon to consider organizational strategies in her day-to-day life, incorporating manualized treatments such as [name] Andrea Colon will provide opportunities to process parenting experiences in session and incorporate strategies from manualized parent training programs 2. Andrea Colon frequently experiences feelings of overwhelm that are difficult to cope with Objective Andrea Colon would like to develop coping strategies and reduce the frequency of meltdowns  Target Date: 2024-03-26 Frequency: Weekly  Progress: 0 Modality: individual  Related Interventions Andrea Colon will be provided opportunities to process experiences in sessions Andrea Colon will provide referrals for additional resources as appropriate  Andrea Colon will help Keishla to identify and disengage from maladaptive thoughts and behaviors using CBT-based strategies Andrea Colon will provide emotion regulation strategies including mindfulness, relaxation, and self care techniques Diagnosis Axis none 300.02 (Generalized anxiety disorder) - Open - [Signifier: n/a]    Axis none 314.01 (Attention deficit disorder with hyperactivity) - Open - [Signifier: n/a]    Conditions For Discharge Achievement of treatment goals and objectives      [name], PhD               [name], PhD               [name], PhD

## 2023-08-28 ENCOUNTER — Ambulatory Visit (INDEPENDENT_AMBULATORY_CARE_PROVIDER_SITE_OTHER): Payer: 59 | Admitting: Clinical

## 2023-08-28 DIAGNOSIS — F411 Generalized anxiety disorder: Secondary | ICD-10-CM | POA: Diagnosis not present

## 2023-08-28 DIAGNOSIS — F902 Attention-deficit hyperactivity disorder, combined type: Secondary | ICD-10-CM | POA: Diagnosis not present

## 2023-08-28 DIAGNOSIS — F331 Major depressive disorder, recurrent, moderate: Secondary | ICD-10-CM | POA: Diagnosis not present

## 2023-08-28 NOTE — Progress Notes (Signed)
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 She had not engaged in self-harm in the past week. She reported a remission in depressive symptoms, but has been experiencing significant psychomotor agitation. Therapist let her know that she had called Dr. Earlene Plater to consult and was waiting to hear back. She had engaged in several acts of self care. Therapist engaged her in a discussion of boundary setting, pointing out how taking time for herself is a form of boundary holding. She is scheduled to be seen again in one week.   General Behavior: WNL Attire: WNL Gait: WNL Motor Activity: WNL Stream of Thought - Productivity: WNL Stream of thought - Progression: WNL Stream of thought - Language:  WNL Emotional tone and reactions - Mood: WNL  Emotional tone and reactions - Affect: WNL Mental trend/Content of thoughts - Perception: WNL  Mental trend/Content of thoughts - Orientation: WNL Mental trend/Content of thoughts - Memory: WNL Mental trend/Content of thoughts - General knowledge: WNL  Insight: WNL Judgment: WNL Intelligence: WNL Mental Status Comment: Client appears oriented to time and space. 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  Client Treatment Preferences  Andrea Colon prefers in-person sessions, during school hours  Client Statement of Needs  Andrea Colon shared that she is seeking parenting skills, as well as healthier coping skills.  Treatment Level  Weekly  Symptoms  Anxiety : restlessness, difficulty relaxing, racing thoughts, difficulty concentrating, difficulty with creating and adhering to plans and routines (Status: maintained). Depression: tearfulness (Status: maintained).  Problems Addressed  New Description, New Description   Goals 1. Stormy would like to process past experiences in order to better understand how they impact her at present Objective Andrea Colon will increase ability to connect to and understand emotional experiences, especially those related to past traumas  Target Date: 2024-03-26 Frequency: Weekly  Progress: 0 Modality: individual  Related Interventions Therapist will help Andrea Colon to consider organizational strategies in her day-to-day life, incorporating manualized treatments such as [name] Therapist will provide opportunities to process parenting experiences in session and incorporate strategies from manualized parent training programs 2. Andrea Colon frequently experiences feelings of overwhelm that are difficult to cope with Objective Andrea Colon would like to develop coping strategies and reduce the frequency of meltdowns  Target Date: 2024-03-26 Frequency: Weekly  Progress: 0 Modality: individual  Related Interventions Andrea Colon will be provided opportunities to process experiences in sessions Therapist will provide referrals for additional resources as appropriate  Therapist will help Andrea Colon to identify and disengage from maladaptive thoughts and behaviors using CBT-based strategies Therapist will provide emotion regulation strategies including mindfulness, relaxation, and self care techniques Diagnosis Axis none 300.02 (Generalized anxiety disorder) - Open - [Signifier: n/a]    Axis none 314.01 (Attention deficit disorder with hyperactivity) - Open - [Signifier: n/a]    Conditions For Discharge Achievement of treatment goals and objectives  Andrea [name], Andrea Colon               Andrea Colon, PhDTime: 9:00 am-9:59 am CPT Code: [id number] 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 She reported having engaged in self harm once in the past week She reflected upon a stressful week during which she had struggled with her son and  come into conflict with her husband.  Therapist suggested holding off on further processing trauma until her mood is more stable, and strongly encouraged her to consult with her prescribing provider about getting back onto a mood stabilizing medication. Suicidal ideation was endorsed, without plan or intent, and Andrea Colon agreed to go to the hospital if a plan or intent emerge. She assured the therapist that she will be safe. She is scheduled to be seen again in one week.  General Behavior: WNL Attire: WNL Gait: WNL Motor Activity: WNL Stream of Thought - Productivity: WNL Stream of thought - Progression: WNL Stream of thought - Language:  WNL Emotional tone and reactions - Mood: WNL  Emotional tone and reactions - Affect: WNL Mental trend/Content of thoughts - Perception: WNL  Mental trend/Content of thoughts - Orientation: WNL Mental trend/Content of thoughts - Memory: WNL Mental trend/Content of thoughts - General knowledge: WNL  Insight: WNL Judgment: WNL Intelligence: WNL Mental Status Comment: Client appears oriented to time and space. 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  Client Treatment Preferences  Andrea Colon prefers in-person sessions, during school hours  Client Statement of Needs  Andrea Colon shared that she is seeking parenting skills, as well as healthier coping skills.  Treatment Level  Weekly  Symptoms  Anxiety : restlessness, difficulty relaxing, racing thoughts, difficulty concentrating, difficulty with creating and adhering to plans and routines (Status: maintained). Depression: tearfulness (Status: maintained).  Problems Addressed  New Description, New Description  Goals 1. Andrea Colon would like to process past experiences in order to better understand how they impact her at present Objective Andrea Colon  will increase ability to connect to and understand emotional experiences, especially those related to past traumas  Target Date: 2024-03-26 Frequency: Weekly  Progress: 0 Modality: individual  Related Interventions Therapist will help Andrea Colon to consider organizational strategies in her day-to-day life, incorporating manualized treatments such as [name] Therapist will provide opportunities to process parenting experiences in session and incorporate strategies from manualized parent training programs 2. Andrea Colon frequently experiences feelings of overwhelm that are difficult to cope with Objective Andrea Colon would like to develop coping strategies and reduce the frequency of meltdowns  Target Date: 2024-03-26 Frequency: Weekly  Progress: 0 Modality: individual  Related Interventions Andrea Colon will be provided opportunities to process experiences in sessions Therapist will provide referrals for additional resources as appropriate  Therapist will help Andrea Colon to identify and disengage from maladaptive thoughts and behaviors using CBT-based strategies Therapist will provide emotion regulation strategies including mindfulness, relaxation, and self care techniques Diagnosis Axis none 300.02 (Generalized anxiety disorder) - Open - [Signifier: n/a]    Axis none 314.01 (Attention deficit disorder with hyperactivity) - Open - [Signifier: n/a]    Conditions For Discharge Achievement of treatment goals and objectives      Andrea [name], Andrea Colon               Andrea [name], Andrea Colon               Andrea [name], Andrea Colon               Andrea [name], Andrea Colon

## 2023-08-31 ENCOUNTER — Other Ambulatory Visit: Payer: Self-pay | Admitting: Family Medicine

## 2023-09-01 NOTE — Telephone Encounter (Signed)
Last office visit 06/20/2023 for CPE.  Last refilled 07/29/23 for #30 with no refills.  Next Appt: No future appointments.

## 2023-09-02 MED ORDER — ZALEPLON 5 MG PO CAPS: 5.0000 mg | ORAL_CAPSULE | Freq: Every evening | ORAL | 0 refills | Status: DC | PRN

## 2023-09-04 ENCOUNTER — Ambulatory Visit: Payer: 59 | Admitting: Obstetrics and Gynecology

## 2023-09-04 ENCOUNTER — Ambulatory Visit (INDEPENDENT_AMBULATORY_CARE_PROVIDER_SITE_OTHER): Payer: 59 | Admitting: Clinical

## 2023-09-04 DIAGNOSIS — F411 Generalized anxiety disorder: Secondary | ICD-10-CM | POA: Diagnosis not present

## 2023-09-04 DIAGNOSIS — F331 Major depressive disorder, recurrent, moderate: Secondary | ICD-10-CM | POA: Diagnosis not present

## 2023-09-04 DIAGNOSIS — F902 Attention-deficit hyperactivity disorder, combined type: Secondary | ICD-10-CM | POA: Diagnosis not present
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
## 2023-09-09 ENCOUNTER — Ambulatory Visit (INDEPENDENT_AMBULATORY_CARE_PROVIDER_SITE_OTHER): Payer: 59 | Admitting: Clinical

## 2023-09-09 DIAGNOSIS — F331 Major depressive disorder, recurrent, moderate: Secondary | ICD-10-CM | POA: Diagnosis not present

## 2023-09-09 DIAGNOSIS — F411 Generalized anxiety disorder: Secondary | ICD-10-CM

## 2023-09-09 DIAGNOSIS — F902 Attention-deficit hyperactivity disorder, combined type: Secondary | ICD-10-CM
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
## 2023-09-14 ENCOUNTER — Other Ambulatory Visit: Payer: Self-pay | Admitting: Family Medicine

## 2023-09-15 MED ORDER — CLONAZEPAM 0.5 MG PO TABS: 0.5000 mg | ORAL_TABLET | Freq: Every day | ORAL | 0 refills | Status: DC | PRN

## 2023-09-15 NOTE — Telephone Encounter (Signed)
Last office visit 06/20/2023 for CPE.  Last refilled 08/19/2023 for #35 with no refills.  Next Appt: No future appointments.

## 2023-09-16 ENCOUNTER — Ambulatory Visit (INDEPENDENT_AMBULATORY_CARE_PROVIDER_SITE_OTHER): Payer: 59 | Admitting: Clinical

## 2023-09-16 DIAGNOSIS — F331 Major depressive disorder, recurrent, moderate: Secondary | ICD-10-CM | POA: Diagnosis not present

## 2023-09-16 DIAGNOSIS — F902 Attention-deficit hyperactivity disorder, combined type: Secondary | ICD-10-CM | POA: Diagnosis not present

## 2023-09-16 DIAGNOSIS — F411 Generalized anxiety disorder: Secondary | ICD-10-CM | POA: Diagnosis not present

## 2023-09-16 NOTE — Progress Notes (Signed)
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 She had engaged in self-harm in the past week that consisted of light cuts to her upper thigh. She had written about this to the therapist, and connected her self-harm to anger she felt toward her weight management doctor. Therapist processed this with her, encouraging her to allow herself to feel her anger toward her other provider. Therapist also encouraged her to explore the idea of communicating it verbally during her next appointment. She is scheduled to be seen again on Thursday.    General Behavior: WNL Attire: WNL Gait: WNL Motor Activity: WNL Stream of Thought - Productivity: WNL Stream of thought - Progression: WNL Stream of thought - Language:  WNL Emotional tone and reactions - Mood: WNL  Emotional tone and reactions - Affect: WNL Mental trend/Content of thoughts - Perception: WNL  Mental trend/Content of thoughts - Orientation: WNL Mental trend/Content of thoughts - Memory: WNL Mental trend/Content of thoughts - General knowledge: WNL  Insight: WNL Judgment: WNL Intelligence: WNL Mental Status Comment: Client appears oriented to time and space. 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  Client Treatment Preferences  Andrea Colon prefers in-person sessions, during school hours  Client Statement of Needs  Andrea Colon shared that she is seeking parenting skills, as well as healthier coping skills.  Treatment Level  Weekly  Symptoms  Anxiety : restlessness, difficulty relaxing, racing thoughts, difficulty concentrating, difficulty with creating and adhering to plans and routines (Status: maintained). Depression: tearfulness (Status: maintained).  Problems Addressed  New Description, New  Description  Goals 1. Andrea Colon would like to process past experiences in order to better understand how they impact her at present Objective Andrea Colon will increase ability to connect to and understand emotional experiences, especially those related to past traumas  Target Date: 2024-03-26 Frequency: Weekly  Progress: 0 Modality: individual  Related Interventions Therapist will help Andrea Colon to consider organizational strategies in her day-to-day life, incorporating manualized treatments such as [name] Therapist will provide opportunities to process parenting experiences in session and incorporate strategies from manualized parent training programs 2. Andrea Colon frequently experiences feelings of overwhelm that are difficult to cope with Objective Andrea Colon would like to develop coping strategies and reduce the frequency of meltdowns  Target Date: 2024-03-26 Frequency: Weekly  Progress: 0 Modality: individual  Related Interventions Andrea Colon will be provided opportunities to process experiences in sessions Therapist will provide referrals for additional resources as appropriate  Therapist will help Andrea Colon to identify and disengage from maladaptive thoughts and behaviors using CBT-based strategies Therapist will provide emotion regulation strategies including mindfulness, relaxation, and self care techniques Diagnosis Axis none 300.02 (Generalized anxiety disorder) - Open - [Signifier: n/a]    Axis none 314.01 (Attention deficit disorder with hyperactivity) - Open - [Signifier: n/a]    Conditions For Discharge Achievement of treatment goals and objectives  Andrea Colon [name], PhD               Andrea Colon, PhDTime: 9:00 am-9:59 am CPT Code: [id number] 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 She reported having engaged in self harm once in the past week She reflected upon a stressful week during which she had struggled with  her son and come into conflict with her husband.  Therapist suggested holding off on further processing trauma until her mood is more stable, and strongly encouraged her to consult with her prescribing provider about getting back onto a mood stabilizing medication. Suicidal ideation was endorsed, without plan or intent, and Andrea Colon agreed to go to the hospital if a plan or intent emerge. She assured the therapist that she will be safe. She is scheduled to be seen again in one week.  General Behavior: WNL Attire: WNL Gait: WNL Motor Activity: WNL Stream of Thought - Productivity: WNL Stream of thought - Progression: WNL Stream of thought - Language:  WNL Emotional tone and reactions - Mood: WNL  Emotional tone and reactions - Affect: WNL Mental trend/Content of thoughts - Perception: WNL  Mental trend/Content of thoughts - Orientation: WNL Mental trend/Content of thoughts - Memory: WNL Mental trend/Content of thoughts - General knowledge: WNL  Insight: WNL Judgment: WNL Intelligence: WNL Mental Status Comment: Client appears oriented to time and space. 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  Client Treatment Preferences  Andrea Colon prefers in-person sessions, during school hours  Client Statement of Needs  Andrea Colon shared that she is seeking parenting skills, as well as healthier coping skills.  Treatment Level  Weekly  Symptoms  Anxiety : restlessness, difficulty relaxing, racing thoughts, difficulty concentrating, difficulty with creating and adhering to plans and routines (Status: maintained). Depression: tearfulness (Status: maintained).  Problems Addressed  New Description, New Description  Goals 1. Andrea Colon would like to process past experiences in order to better understand how they impact her at  present Objective Andrea Colon will increase ability to connect to and understand emotional experiences, especially those related to past traumas  Target Date: 2024-03-26 Frequency: Weekly  Progress: 0 Modality: individual  Related Interventions Therapist will help Andrea Colon to consider organizational strategies in her day-to-day life, incorporating manualized treatments such as [name] Therapist will provide opportunities to process parenting experiences in session and incorporate strategies from manualized parent training programs 2. Andrea Colon frequently experiences feelings of overwhelm that are difficult to cope with Objective Andrea Colon would like to develop coping strategies and reduce the frequency of meltdowns  Target Date: 2024-03-26 Frequency: Weekly  Progress: 0 Modality: individual  Related Interventions Shawona will be provided opportunities to process experiences in sessions Therapist will provide referrals for additional resources as appropriate  Therapist will help Jasmien to identify and disengage from maladaptive thoughts and behaviors using CBT-based strategies Therapist will provide emotion regulation strategies including mindfulness, relaxation, and self care techniques Diagnosis Axis none 300.02 (Generalized anxiety disorder) - Open - [Signifier: n/a]    Axis none 314.01 (Attention deficit disorder with hyperactivity) - Open - [Signifier: n/a]    Conditions For Discharge Achievement of treatment goals and objectives   Andrea Colon [name], PhD               Andrea Colon [name], PhD

## 2023-09-18 ENCOUNTER — Ambulatory Visit: Payer: 59 | Admitting: Clinical

## 2023-09-18 DIAGNOSIS — F331 Major depressive disorder, recurrent, moderate: Secondary | ICD-10-CM | POA: Diagnosis not present

## 2023-09-18 DIAGNOSIS — F902 Attention-deficit hyperactivity disorder, combined type: Secondary | ICD-10-CM

## 2023-09-18 DIAGNOSIS — F411 Generalized anxiety disorder: Secondary | ICD-10-CM

## 2023-09-18 NOTE — Progress Notes (Signed)
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 She had engaged in self-harm in the past week that consisted of light cuts to her upper thigh. Session focused on continuing to process her "favorite person" dynamic with Dr. Earlene Plater. Therapist also engaged her in consideration of coping strategies for when she has the urge to self-harm, and she agreed to try the DIVE reflex. She is scheduled to be seen again in one week.   General Behavior: WNL Attire: WNL Gait: WNL Motor Activity: WNL Stream of Thought - Productivity: WNL Stream of thought - Progression: WNL Stream of thought - Language:  WNL Emotional tone and reactions - Mood: WNL  Emotional tone and reactions - Affect: WNL Mental trend/Content of thoughts - Perception: WNL  Mental trend/Content of thoughts - Orientation: WNL Mental trend/Content of thoughts - Memory: WNL Mental trend/Content of thoughts - General knowledge: WNL  Insight: WNL Judgment: WNL Intelligence: WNL Mental Status Comment: Client appears oriented to time and space. 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 She expresses herself well in writing.  Client Treatment Preferences  Diora prefers in-person sessions, during school hours  Client Statement of Needs  Kamaile shared that she is seeking parenting skills, as well as healthier coping skills.  Treatment Level  Weekly  Symptoms  Anxiety : restlessness, difficulty relaxing, racing thoughts, difficulty concentrating, difficulty with creating and adhering to plans and routines (Status: maintained). Depression: tearfulness (Status: maintained).  Problems Addressed  New Description, New Description  Goals 1. Zyniya would like to process past experiences in order to better understand how they impact  her at present Objective Lizzett will increase ability to connect to and understand emotional experiences, especially those related to past traumas  Target Date: 2024-03-26 Frequency: Weekly  Progress: 0 Modality: individual  Related Interventions Therapist will help Koty to consider organizational strategies in her day-to-day life, incorporating manualized treatments such as [name] Therapist will provide opportunities to process parenting experiences in session and incorporate strategies from manualized parent training programs 2. Crystin frequently experiences feelings of overwhelm that are difficult to cope with Objective Ashya would like to develop coping strategies and reduce the frequency of meltdowns  Target Date: 2024-03-26 Frequency: Weekly  Progress: 0 Modality: individual  Related Interventions Sahnnon will be provided opportunities to process experiences in sessions Therapist will provide referrals for additional resources as appropriate  Therapist will help Delila to identify and disengage from maladaptive thoughts and behaviors using CBT-based strategies Therapist will provide emotion regulation strategies including mindfulness, relaxation, and self care techniques Diagnosis Axis none 300.02 (Generalized anxiety disorder) - Open - [Signifier: n/a]    Axis none 314.01 (Attention deficit disorder with hyperactivity) - Open - [Signifier: n/a]    Conditions For Discharge Achievement of treatment goals and objectives  [name], PhD               [name], PhDTime: 9:00 am-9:59 am CPT Code: 25956L 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 She reported having engaged in self harm once in the past week She reflected upon a stressful week during which she had struggled with her son and come into conflict with her husband. Therapist suggested holding off on further processing trauma until  her mood is more stable, and strongly encouraged  her to consult with her prescribing provider about getting back onto a mood stabilizing medication. Suicidal ideation was endorsed, without plan or intent, and Aylyn agreed to go to the hospital if a plan or intent emerge. She assured the therapist that she will be safe. She is scheduled to be seen again in one week.  General Behavior: WNL Attire: WNL Gait: WNL Motor Activity: WNL Stream of Thought - Productivity: WNL Stream of thought - Progression: WNL Stream of thought - Language:  WNL Emotional tone and reactions - Mood: WNL  Emotional tone and reactions - Affect: WNL Mental trend/Content of thoughts - Perception: WNL  Mental trend/Content of thoughts - Orientation: WNL Mental trend/Content of thoughts - Memory: WNL Mental trend/Content of thoughts - General knowledge: WNL  Insight: WNL Judgment: WNL Intelligence: WNL Mental Status Comment: Client appears oriented to time and space. 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 She expresses herself well in writing.  Client Treatment Preferences  Jaquelyn prefers in-person sessions, during school hours  Client Statement of Needs  Aileen shared that she is seeking parenting skills, as well as healthier coping skills.  Treatment Level  Weekly  Symptoms  Anxiety : restlessness, difficulty relaxing, racing thoughts, difficulty concentrating, difficulty with creating and adhering to plans and routines (Status: maintained). Depression: tearfulness (Status: maintained).  Problems Addressed  New Description, New Description  Goals 1. Aeliana would like to process past experiences in order to better understand how they impact her at present Objective Nyrah will increase ability to connect to and understand emotional experiences, especially those related to past  traumas  Target Date: 2024-03-26 Frequency: Weekly  Progress: 0 Modality: individual  Related Interventions Therapist will help Landrie to consider organizational strategies in her day-to-day life, incorporating manualized treatments such as [name] Therapist will provide opportunities to process parenting experiences in session and incorporate strategies from manualized parent training programs 2. Mirissa frequently experiences feelings of overwhelm that are difficult to cope with Objective Dionysia would like to develop coping strategies and reduce the frequency of meltdowns  Target Date: 2024-03-26 Frequency: Weekly  Progress: 0 Modality: individual  Related Interventions Demetrise will be provided opportunities to process experiences in sessions Therapist will provide referrals for additional resources as appropriate  Therapist will help Baelee to identify and disengage from maladaptive thoughts and behaviors using CBT-based strategies Therapist will provide emotion regulation strategies including mindfulness, relaxation, and self care techniques Diagnosis Axis none 300.02 (Generalized anxiety disorder) - Open - [Signifier: n/a]    Axis none 314.01 (Attention deficit disorder with hyperactivity) - Open - [Signifier: n/a]    Conditions For Discharge Achievement of treatment goals and objectives   [name], PhD               [name], PhD

## 2023-09-24 ENCOUNTER — Ambulatory Visit: Payer: 59 | Admitting: Clinical

## 2023-09-24 DIAGNOSIS — F331 Major depressive disorder, recurrent, moderate: Secondary | ICD-10-CM

## 2023-09-24 DIAGNOSIS — F411 Generalized anxiety disorder: Secondary | ICD-10-CM

## 2023-09-24 DIAGNOSIS — F902 Attention-deficit hyperactivity disorder, combined type: Secondary | ICD-10-CM | POA: Diagnosis not present

## 2023-09-24 NOTE — Progress Notes (Signed)
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 She had not engaged in self-harm in the past week. that consisted of light cuts to her upper thigh. She reported feeling conflicted about whether to go to her appointment with Dr. Earlene Plater the following day. Therapist engaged her in discussion of whether she will be able to handle it emotionally. Suicidal ideation and intent were denied. She is scheduled to be seen again in one week.  General Behavior: WNL Attire: WNL Gait: WNL Motor Activity: WNL Stream of Thought - Productivity: WNL Stream of thought - Progression: WNL Stream of thought - Language:  WNL Emotional tone and reactions - Mood: WNL  Emotional tone and reactions - Affect: WNL Mental trend/Content of thoughts - Perception: WNL  Mental trend/Content of thoughts - Orientation: WNL Mental trend/Content of thoughts - Memory: WNL Mental trend/Content of thoughts - General knowledge: WNL  Insight: WNL Judgment: WNL Intelligence: WNL Mental Status Comment: Client appears oriented to time and space. 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  Client Treatment Preferences  Andrea Colon prefers in-person sessions, during school hours  Client Statement of Needs  Andrea Colon shared that she is seeking parenting skills, as well as healthier coping skills.  Treatment Level  Weekly  Symptoms  Anxiety : restlessness, difficulty relaxing, racing thoughts, difficulty concentrating, difficulty with creating and adhering to plans and routines (Status: maintained). Depression: tearfulness (Status: maintained).  Problems Addressed  New Description, New Description  Goals 1. Andrea Colon would like to process past experiences in order to better understand how they  impact her at present Objective Andrea Colon will increase ability to connect to and understand emotional experiences, especially those related to past traumas  Target Date: 2024-03-26 Frequency: Weekly  Progress: 0 Modality: individual  Related Interventions Therapist will help Pernell to consider organizational strategies in her day-to-day life, incorporating manualized treatments such as [name] Therapist will provide opportunities to process parenting experiences in session and incorporate strategies from manualized parent training programs 2. Andrea Colon frequently experiences feelings of overwhelm that are difficult to cope with Objective Andrea Colon would like to develop coping strategies and reduce the frequency of meltdowns  Target Date: 2024-03-26 Frequency: Weekly  Progress: 0 Modality: individual  Related Interventions Tayleigh will be provided opportunities to process experiences in sessions Therapist will provide referrals for additional resources as appropriate  Therapist will help Niaomi to identify and disengage from maladaptive thoughts and behaviors using CBT-based strategies Therapist will provide emotion regulation strategies including mindfulness, relaxation, and self care techniques Diagnosis Axis none 300.02 (Generalized anxiety disorder) - Open - [Signifier: n/a]    Axis none 314.01 (Attention deficit disorder with hyperactivity) - Open - [Signifier: n/a]    Conditions For Discharge Achievement of treatment goals and objectives  [name], PhD               [name], PhDTime: 9:00 am-9:59 am CPT Code: [id number] 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 She reported having engaged in self harm once in the past week She reflected upon a stressful week during which she had struggled with her son and come into conflict with her husband. Therapist suggested holding off on further processing trauma  until her mood is more stable, and strongly encouraged  her to consult with her prescribing provider about getting back onto a mood stabilizing medication. Suicidal ideation was endorsed, without plan or intent, and Andrea Colon agreed to go to the hospital if a plan or intent emerge. She assured the therapist that she will be safe. She is scheduled to be seen again in one week.  General Behavior: WNL Attire: WNL Gait: WNL Motor Activity: WNL Stream of Thought - Productivity: WNL Stream of thought - Progression: WNL Stream of thought - Language:  WNL Emotional tone and reactions - Mood: WNL  Emotional tone and reactions - Affect: WNL Mental trend/Content of thoughts - Perception: WNL  Mental trend/Content of thoughts - Orientation: WNL Mental trend/Content of thoughts - Memory: WNL Mental trend/Content of thoughts - General knowledge: WNL  Insight: WNL Judgment: WNL Intelligence: WNL Mental Status Comment: Client appears oriented to time and space. 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  Client Treatment Preferences  Andrea Colon prefers in-person sessions, during school hours  Client Statement of Needs  Andrea Colon shared that she is seeking parenting skills, as well as healthier coping skills.  Treatment Level  Weekly  Symptoms  Anxiety : restlessness, difficulty relaxing, racing thoughts, difficulty concentrating, difficulty with creating and adhering to plans and routines (Status: maintained). Depression: tearfulness (Status: maintained).  Problems Addressed  New Description, New Description  Goals 1. Andrea Colon would like to process past experiences in order to better understand how they impact her at present Objective Andrea Colon will increase ability to connect to and understand emotional experiences, especially those related  to past traumas  Target Date: 2024-03-26 Frequency: Weekly  Progress: 0 Modality: individual  Related Interventions Therapist will help Gerda to consider organizational strategies in her day-to-day life, incorporating manualized treatments such as [name] Therapist will provide opportunities to process parenting experiences in session and incorporate strategies from manualized parent training programs 2. Andrea Colon frequently experiences feelings of overwhelm that are difficult to cope with Objective Andrea Colon would like to develop coping strategies and reduce the frequency of meltdowns  Target Date: 2024-03-26 Frequency: Weekly  Progress: 0 Modality: individual  Related Interventions Andrea Colon will be provided opportunities to process experiences in sessions Therapist will provide referrals for additional resources as appropriate  Therapist will help Andrea Colon to identify and disengage from maladaptive thoughts and behaviors using CBT-based strategies Therapist will provide emotion regulation strategies including mindfulness, relaxation, and self care techniques Diagnosis Axis none 300.02 (Generalized anxiety disorder) - Open - [Signifier: n/a]    Axis none 314.01 (Attention deficit disorder with hyperactivity) - Open - [Signifier: n/a]    Conditions For Discharge Achievement of treatment goals and objectives   [name], PhD               [name], PhD

## 2023-09-25 ENCOUNTER — Ambulatory Visit: Payer: 59 | Admitting: Family Medicine

## 2023-09-25 VITALS — BP 112/80 | HR 90 | Temp 98.7°F | Ht 64.0 in | Wt 225.0 lb

## 2023-09-25 DIAGNOSIS — L299 Pruritus, unspecified: Secondary | ICD-10-CM

## 2023-09-25 NOTE — Progress Notes (Signed)
Patient ID: Andrea Colon, female    DOB: 1976/05/25, 47 y.o.   MRN: [id number]  This visit was conducted in person.  BP 112/80 (BP Location: Left Arm, Patient Position: Sitting, Cuff Size: Normal)   Pulse 90   Temp 98.7 F (37.1 C) (Oral)   Ht 5\' 4"  (1.626 m)   Wt 225 lb (102.1 kg)   SpO2 97%   BMI 38.62 kg/m    CC:  Chief Complaint  Patient presents with   itch     Patients this has been going on for a couple of months now. Notices it happens a couple of hours after she showers and sometimes it happens randomly at night. She believes it may her water not sure. Patient states she's tried allergy meds/creams/ lotions/ showers.     Subjective:   HPI: Andrea Colon is a 47 y.o. female presenting on 09/25/2023 for itch  (Patients this has been going on for a couple of months now. Notices it happens a couple of hours after she showers and sometimes it happens randomly at night. She believes it may her water not sure. Patient states she's tried allergy meds/creams/ lotions/ showers. )  New onset pruritus ongoing intermittently for several months.  She notices it primarily a couple hours after she showers and sometimes randomly at night.  No dry skin, no rash.   No new medications.  She has tried to treat with allergy medications ( zyrtec, hydroxizine), lotions ( moisturizers, aloe, diphenhydramine calamine  gel) creams.   Has had similar issue when 19..  nml labs.  Went away once she moved,  may also been on OCPs.         Relevant past medical, surgical, family and social history reviewed and updated as indicated. Interim medical history since our last visit reviewed. Allergies and medications reviewed and updated. Outpatient Medications Prior to Visit  Medication Sig Dispense Refill   amphetamine-dextroamphetamine (ADDERALL XR) 20 MG 24 hr capsule Take 20 mg by mouth daily.     Bioflavonoid Products (ESTER-C PO) Take 1,000 mg by mouth daily.     escitalopram  (LEXAPRO) 10 MG tablet Take 10 mg by mouth daily.     LINZESS 145 MCG CAPS capsule Take 145 mcg by mouth daily.     MAGNESIUM PO Take 1 tablet by mouth in the morning and at bedtime.     meloxicam (MOBIC) 15 MG tablet Take 15 mg by mouth daily.     Omega-3 Fatty Acids (FISH OIL PO) Take 2,200 mg by mouth daily.     spironolactone (ALDACTONE) 50 MG tablet Take 50 mg by mouth 2 (two) times daily.     tretinoin (RETIN-A) 0.025 % cream as needed.     Vitamin D, Cholecalciferol, 25 MCG (1000 UT) TABS Take 4 tablets by mouth daily.     VYVANSE 60 MG capsule Take 60 mg by mouth every morning.     clonazePAM (KLONOPIN) 0.5 MG tablet Take 1-2 tablets (0.5-1 mg total) by mouth daily as needed for anxiety. 35 tablet 0   zaleplon (SONATA) 5 MG capsule Take 1 capsule (5 mg total) by mouth at bedtime as needed for sleep. 30 capsule 0   lamoTRIgine (LAMICTAL) 25 MG tablet Take 50 mg by mouth daily. (Patient not taking: Reported on 09/25/2023)     No facility-administered medications prior to visit.     Per HPI unless specifically indicated in ROS section below Review of Systems  Constitutional:  Negative for  fatigue and fever.  HENT:  Negative for congestion.   Eyes:  Negative for pain.  Respiratory:  Negative for cough and shortness of breath.   Cardiovascular:  Negative for chest pain, palpitations and leg swelling.  Gastrointestinal:  Negative for abdominal pain.  Genitourinary:  Negative for dysuria and vaginal bleeding.  Musculoskeletal:  Negative for back pain.  Neurological:  Negative for syncope, light-headedness and headaches.  Psychiatric/Behavioral:  Negative for dysphoric mood.    Objective:  BP 112/80 (BP Location: Left Arm, Patient Position: Sitting, Cuff Size: Normal)   Pulse 90   Temp 98.7 F (37.1 C) (Oral)   Ht 5\' 4"  (1.626 m)   Wt 225 lb (102.1 kg)   SpO2 97%   BMI 38.62 kg/m   Wt Readings from Last 3 Encounters:  09/25/23 225 lb (102.1 kg)  06/20/23 228 lb 2 oz (103.5 kg)   04/15/23 222 lb 4 oz (100.8 kg)      Physical Exam Constitutional:      General: She is not in acute distress.    Appearance: Normal appearance. She is well-developed. She is not ill-appearing or toxic-appearing.  HENT:     Head: Normocephalic.     Right Ear: Hearing, tympanic membrane, ear canal and external ear normal. Tympanic membrane is not erythematous, retracted or bulging.     Left Ear: Hearing, tympanic membrane, ear canal and external ear normal. Tympanic membrane is not erythematous, retracted or bulging.     Nose: No mucosal edema or rhinorrhea.     Right Sinus: No maxillary sinus tenderness or frontal sinus tenderness.     Left Sinus: No maxillary sinus tenderness or frontal sinus tenderness.     Mouth/Throat:     Mouth: Oropharynx is clear and moist and mucous membranes are normal.     Pharynx: Uvula midline.  Eyes:     General: Lids are normal. Lids are everted, no foreign bodies appreciated.     Extraocular Movements: EOM normal.     Conjunctiva/sclera: Conjunctivae normal.     Pupils: Pupils are equal, round, and reactive to light.  Neck:     Thyroid: No thyroid mass or thyromegaly.     Vascular: No carotid bruit.     Trachea: Trachea normal.  Cardiovascular:     Rate and Rhythm: Normal rate and regular rhythm.     Pulses: Normal pulses.     Heart sounds: Normal heart sounds, S1 normal and S2 normal. No murmur heard.    No friction rub. No gallop.  Pulmonary:     Effort: Pulmonary effort is normal. No tachypnea or respiratory distress.     Breath sounds: Normal breath sounds. No decreased breath sounds, wheezing, rhonchi or rales.  Abdominal:     General: Bowel sounds are normal.     Palpations: Abdomen is soft.     Tenderness: There is no abdominal tenderness.  Musculoskeletal:     Cervical back: Normal range of motion and neck supple.  Skin:    General: Skin is warm, dry and intact.     Findings: No rash.  Neurological:     Mental Status: She is alert.   Psychiatric:        Mood and Affect: Mood is not anxious or depressed.        Speech: Speech normal.        Behavior: Behavior normal. Behavior is cooperative.        Thought Content: Thought content normal.  Cognition and Memory: Cognition and memory normal.        Judgment: Judgment normal.       Results for orders placed or performed in visit on 09/25/23  CBC with Differential/Platelet  Result Value Ref Range   WBC 11.0 (H) 4.0 - 10.5 K/uL   RBC 4.58 3.87 - 5.11 Mil/uL   Hemoglobin 13.8 12.0 - 15.0 g/dL   HCT 21.3 08.6 - 57.8 %   MCV 86.9 78.0 - 100.0 fl   MCHC 34.6 30.0 - 36.0 g/dL   RDW 46.9 62.9 - 52.8 %   Platelets 377.0 150.0 - 400.0 K/uL   Neutrophils Relative % 71.5 43.0 - 77.0 %   Lymphocytes Relative 19.4 12.0 - 46.0 %   Monocytes Relative 6.4 3.0 - 12.0 %   Eosinophils Relative 1.6 0.0 - 5.0 %   Basophils Relative 1.1 0.0 - 3.0 %   Neutro Abs 7.8 (H) 1.4 - 7.7 K/uL   Lymphs Abs 2.1 0.7 - 4.0 K/uL   Monocytes Absolute 0.7 0.1 - 1.0 K/uL   Eosinophils Absolute 0.2 0.0 - 0.7 K/uL   Basophils Absolute 0.1 0.0 - 0.1 K/uL  Comprehensive metabolic panel  Result Value Ref Range   Sodium 135 135 - 145 mEq/L   Potassium 4.0 3.5 - 5.1 mEq/L   Chloride 99 96 - 112 mEq/L   CO2 28 19 - 32 mEq/L   Glucose, Bld 86 70 - 99 mg/dL   BUN 11 6 - 23 mg/dL   Creatinine, Ser 4.13 0.40 - 1.20 mg/dL   Total Bilirubin 0.3 0.2 - 1.2 mg/dL   Alkaline Phosphatase 43 39 - 117 U/L   AST 13 0 - 37 U/L   ALT 9 0 - 35 U/L   Total Protein 7.0 6.0 - 8.3 g/dL   Albumin 4.3 3.5 - 5.2 g/dL   GFR 24.40 >10.27 mL/min   Calcium 9.6 8.4 - 10.5 mg/dL  T4, free  Result Value Ref Range   Free T4 0.67 0.60 - 1.60 ng/dL  T3, free  Result Value Ref Range   T3, Free 3.9 2.3 - 4.2 pg/mL  TSH  Result Value Ref Range   TSH 1.40 0.35 - 5.50 uIU/mL    Assessment and Plan  Pruritus Assessment & Plan: Acute, potentially secondary to allergies, environmental exposure versus skin reaction to  heat of shower. Will evaluate for secondary causes of pruritus with labs. Did not no red flags or sign of anaphylaxis.  Continue skin hydration after bathing.  Decreased temperature of baths to not be as hot. If not improving as expected consider referral to allergist, and celiac or alpha gal testing.   Orders: -     CBC with Differential/Platelet -     Comprehensive metabolic panel -     T4, free -     T3, free -     TSH    No follow-ups on file.   Kerby Nora, MD

## 2023-09-26 LAB — CBC WITH DIFFERENTIAL/PLATELET
Basophils Absolute: 0.1 10*3/uL (ref 0.0–0.1)
Basophils Relative: 1.1 % (ref 0.0–3.0)
Eosinophils Absolute: 0.2 10*3/uL (ref 0.0–0.7)
Eosinophils Relative: 1.6 % (ref 0.0–5.0)
HCT: 39.8 % (ref 36.0–46.0)
Hemoglobin: 13.8 g/dL (ref 12.0–15.0)
Lymphocytes Relative: 19.4 % (ref 12.0–46.0)
Lymphs Abs: 2.1 10*3/uL (ref 0.7–4.0)
MCHC: 34.6 g/dL (ref 30.0–36.0)
MCV: 86.9 fL (ref 78.0–100.0)
Monocytes Absolute: 0.7 10*3/uL (ref 0.1–1.0)
Monocytes Relative: 6.4 % (ref 3.0–12.0)
Neutro Abs: 7.8 10*3/uL — ABNORMAL HIGH (ref 1.4–7.7)
Neutrophils Relative %: 71.5 % (ref 43.0–77.0)
Platelets: 377 10*3/uL (ref 150.0–400.0)
RBC: 4.58 Mil/uL (ref 3.87–5.11)
RDW: 12.7 % (ref 11.5–15.5)
WBC: 11 10*3/uL — ABNORMAL HIGH (ref 4.0–10.5)

## 2023-09-26 LAB — TSH: TSH: 1.4 u[IU]/mL (ref 0.35–5.50)

## 2023-09-26 LAB — COMPREHENSIVE METABOLIC PANEL
ALT: 9 U/L (ref 0–35)
AST: 13 U/L (ref 0–37)
Albumin: 4.3 g/dL (ref 3.5–5.2)
Alkaline Phosphatase: 43 U/L (ref 39–117)
BUN: 11 mg/dL (ref 6–23)
CO2: 28 meq/L (ref 19–32)
Calcium: 9.6 mg/dL (ref 8.4–10.5)
Chloride: 99 meq/L (ref 96–112)
Creatinine, Ser: 0.72 mg/dL (ref 0.40–1.20)
GFR: 99.18 mL/min (ref >60.00)
Glucose, Bld: 86 mg/dL (ref 70–99)
Potassium: 4 meq/L (ref 3.5–5.1)
Sodium: 135 meq/L (ref 135–145)
Total Bilirubin: 0.3 mg/dL (ref 0.2–1.2)
Total Protein: 7 g/dL (ref 6.0–8.3)

## 2023-09-26 LAB — T3, FREE: T3, Free: 3.9 pg/mL (ref 2.3–4.2)

## 2023-09-26 LAB — T4, FREE: Free T4: 0.67 ng/dL (ref 0.60–1.60)

## 2023-09-27 ENCOUNTER — Encounter: Payer: Self-pay | Admitting: Family Medicine

## 2023-10-02 ENCOUNTER — Ambulatory Visit (INDEPENDENT_AMBULATORY_CARE_PROVIDER_SITE_OTHER): Payer: 59 | Admitting: Clinical

## 2023-10-02 DIAGNOSIS — F902 Attention-deficit hyperactivity disorder, combined type: Secondary | ICD-10-CM

## 2023-10-02 DIAGNOSIS — F331 Major depressive disorder, recurrent, moderate: Secondary | ICD-10-CM | POA: Diagnosis not present

## 2023-10-02 DIAGNOSIS — F411 Generalized anxiety disorder: Secondary | ICD-10-CM
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
## 2023-10-05 ENCOUNTER — Other Ambulatory Visit: Payer: Self-pay | Admitting: Family Medicine

## 2023-10-06 NOTE — Telephone Encounter (Signed)
Last office visit 09/25/2023 for Pruritus.  Last refilled clonazepam 09/15/2023 for #35 with no refills.  Sonata 09/02/2023 for #30 with no refills.  Next Appt: No future appointments with PCP.

## 2023-10-07 ENCOUNTER — Other Ambulatory Visit: Payer: 59

## 2023-10-07 MED ORDER — ZALEPLON 5 MG PO CAPS: 5.0000 mg | ORAL_CAPSULE | Freq: Every evening | ORAL | 0 refills | Status: DC | PRN

## 2023-10-07 MED ORDER — CLONAZEPAM 0.5 MG PO TABS: 0.5000 mg | ORAL_TABLET | Freq: Every day | ORAL | 0 refills | Status: DC | PRN

## 2023-10-08 ENCOUNTER — Ambulatory Visit: Payer: 59 | Admitting: Clinical

## 2023-10-08 DIAGNOSIS — F411 Generalized anxiety disorder: Secondary | ICD-10-CM

## 2023-10-08 DIAGNOSIS — F331 Major depressive disorder, recurrent, moderate: Secondary | ICD-10-CM

## 2023-10-08 DIAGNOSIS — F902 Attention-deficit hyperactivity disorder, combined type: Secondary | ICD-10-CM | POA: Diagnosis not present

## 2023-10-08 NOTE — Progress Notes (Signed)
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 She had engaged in self-harm, consisting of 6 shallow cuts to her upper thigh, in the past week. She again presented as tearful, and reflected upon challenges that had arisen in a friendship related to her son. Therapist pointed out black and white thinking and suggested communication strategies, as well as coping strategies. Shandora is scheduled to be seen again in one week.  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 She expresses herself well in writing.  Client Treatment Preferences  Vitalia prefers in-person sessions, during school hours  Client Statement of Needs  Nazaret shared that she is seeking parenting skills, as well as healthier coping skills.  Treatment Level  Weekly  Symptoms  Anxiety : restlessness, difficulty relaxing, racing thoughts, difficulty concentrating, difficulty with creating and adhering to plans and routines (Status: maintained). Depression: tearfulness (Status: maintained).  Problems Addressed  New Description, New Description  Goals 1. Ayomide would like to process past experiences in order to better understand how they impact her at present Objective Anaiah will increase ability to connect to and understand emotional experiences, especially those related to past traumas  Target Date: 2024-03-26 Frequency: Weekly  Progress: 0 Modality: individual  Related Interventions Therapist will help Neeti to consider organizational strategies in her day-to-day life, incorporating manualized treatments such as [name] Therapist will provide opportunities to process parenting experiences in session and incorporate strategies from manualized parent training programs 2. Oris frequently experiences feelings of overwhelm that are difficult to cope  with Objective Breely would like to develop coping strategies and reduce the frequency of meltdowns  Target Date: 2024-03-26 Frequency: Weekly  Progress: 0 Modality: individual  Related Interventions Natia will be provided opportunities to process experiences in sessions Therapist will provide referrals for additional resources as appropriate  Therapist will help Lorry to identify and disengage from maladaptive thoughts and behaviors using CBT-based strategies Therapist will provide emotion regulation strategies including mindfulness, relaxation, and self care techniques Diagnosis Axis none 300.02 (Generalized anxiety disorder) - Open - [Signifier: n/a]    Axis none 314.01 (Attention deficit disorder with hyperactivity) - Open - [Signifier: n/a]    Conditions For Discharge Achievement of treatment goals and objectives   [name], PhD               [name], PhD

## 2023-10-09 ENCOUNTER — Other Ambulatory Visit: Payer: 59

## 2023-10-09 DIAGNOSIS — L299 Pruritus, unspecified: Secondary | ICD-10-CM | POA: Insufficient documentation

## 2023-10-09 NOTE — Assessment & Plan Note (Signed)
Acute, potentially secondary to allergies, environmental exposure versus skin reaction to heat of shower. Will evaluate for secondary causes of pruritus with labs. Did not no red flags or sign of anaphylaxis.  Continue skin hydration after bathing.  Decreased temperature of baths to not be as hot. If not improving as expected consider referral to allergist, and celiac or alpha gal testing.

## 2023-10-16 ENCOUNTER — Ambulatory Visit: Payer: 59 | Admitting: Clinical

## 2023-10-16 DIAGNOSIS — F331 Major depressive disorder, recurrent, moderate: Secondary | ICD-10-CM | POA: Diagnosis not present

## 2023-10-16 DIAGNOSIS — F411 Generalized anxiety disorder: Secondary | ICD-10-CM

## 2023-10-16 DIAGNOSIS — F902 Attention-deficit hyperactivity disorder, combined type: Secondary | ICD-10-CM | POA: Diagnosis not present
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
## 2023-10-23 ENCOUNTER — Ambulatory Visit: Payer: 59 | Admitting: Clinical

## 2023-10-23 DIAGNOSIS — F411 Generalized anxiety disorder: Secondary | ICD-10-CM | POA: Diagnosis not present

## 2023-10-23 DIAGNOSIS — F902 Attention-deficit hyperactivity disorder, combined type: Secondary | ICD-10-CM

## 2023-10-23 DIAGNOSIS — F331 Major depressive disorder, recurrent, moderate: Secondary | ICD-10-CM | POA: Diagnosis not present

## 2023-10-23 NOTE — Progress Notes (Signed)
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 Session focused on processing her most recent session, as well as a message she had sent to the therapist apologizing. Therapist encouraged her to explore the underpinnings of her anger toward her friend, as well as her own emotional responses when she is having difficulty with her son. She had contacted [facility] for intensive DBT, and starts next week. She had engaged in self-harm in the bathroom following her last appointment, and showed the therapist 6 shallow cuts on her arm. She agreed not to engage in further self harm in the coming week. Suicidal ideation and intent were denied.   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 She expresses herself well in writing.  Client Treatment Preferences  Elienai prefers in-person sessions, during school hours  Client Statement of Needs  Avalyse shared that she is seeking parenting skills, as well as healthier coping skills.  Treatment Level  Weekly  Symptoms  Anxiety : restlessness, difficulty relaxing, racing thoughts, difficulty concentrating, difficulty with creating and adhering to plans and routines (Status: maintained). Depression: tearfulness (Status: maintained).  Problems Addressed  New Description, New Description  Goals 1. Donnelle would like to process past experiences in order to better understand how they impact her at present Objective Genoa will increase ability to connect to and understand emotional experiences, especially those related to past traumas  Target Date: 2024-03-26 Frequency: Weekly  Progress: 0 Modality: individual  Related Interventions Therapist will help Fatimata to consider organizational strategies in her day-to-day life, incorporating manualized treatments such as Mastering Your Adult  ADHD Therapist will provide opportunities to process parenting experiences in session and incorporate strategies from manualized parent training programs 2. Zykeria frequently experiences feelings of overwhelm that are difficult to cope with Objective Nhia would like to develop coping strategies and reduce the frequency of meltdowns  Target Date: 2024-03-26 Frequency: Weekly  Progress: 0 Modality: individual  Related Interventions Shikira will be provided opportunities to process experiences in sessions Therapist will provide referrals for additional resources as appropriate  Therapist will help Veryl to identify and disengage from maladaptive thoughts and behaviors using CBT-based strategies Therapist will provide emotion regulation strategies including mindfulness, relaxation, and self care techniques Diagnosis Axis none 300.02 (Generalized anxiety disorder) - Open - [Signifier: n/a]    Axis none 314.01 (Attention deficit disorder with hyperactivity) - Open - [Signifier: n/a]    Conditions For Discharge Achievement of treatment goals and objectives  [name], PhD               [name], PhD

## 2023-10-24 ENCOUNTER — Encounter: Payer: Self-pay | Admitting: Family Medicine

## 2023-10-30 ENCOUNTER — Ambulatory Visit: Payer: 59 | Admitting: Clinical

## 2023-10-30 DIAGNOSIS — F331 Major depressive disorder, recurrent, moderate: Secondary | ICD-10-CM

## 2023-10-30 DIAGNOSIS — F902 Attention-deficit hyperactivity disorder, combined type: Secondary | ICD-10-CM | POA: Diagnosis not present

## 2023-10-30 DIAGNOSIS — F411 Generalized anxiety disorder: Secondary | ICD-10-CM | POA: Diagnosis not present

## 2023-10-30 NOTE — Progress Notes (Signed)
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 Session focused first on processing her disclosure of having self-harmed in the therapist's bathroom. Therapist pointed out how this kind of behavior can be a sign of escalation among individuals with borderline personality disorder. Andrea Colon had not engaged in self harm in the past week. She had started taking pristiq, and reported increased mood stability. Session focused on better understanding dynamics in her relationship with her son. She is scheduled to be seen again in one week.  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  Client Treatment Preferences  Andrea Colon prefers in-person sessions, during school hours  Client Statement of Needs  Andrea Colon shared that she is seeking parenting skills, as well as healthier coping skills.  Treatment Level  Weekly  Symptoms  Anxiety : restlessness, difficulty relaxing, racing thoughts, difficulty concentrating, difficulty with creating and adhering to plans and routines (Status: maintained). Depression: tearfulness (Status: maintained).  Problems Addressed  New Description, New Description  Goals 1. Andrea Colon would like to process past experiences in order to better understand how they impact her at present Objective Andrea Colon will increase ability to connect to and understand emotional experiences, especially those related to past traumas  Target Date: 2024-03-26 Frequency: Weekly  Progress: 0 Modality: individual  Related Interventions Therapist will help Andrea Colon to consider organizational strategies in her day-to-day life, incorporating manualized treatments such as [name] Therapist will provide opportunities to process parenting experiences in session and incorporate strategies from manualized  parent training programs 2. Andrea Colon frequently experiences feelings of overwhelm that are difficult to cope with Objective Andrea Colon would like to develop coping strategies and reduce the frequency of meltdowns  Target Date: 2024-03-26 Frequency: Weekly  Progress: 0 Modality: individual  Related Interventions Andrea Colon will be provided opportunities to process experiences in sessions Therapist will provide referrals for additional resources as appropriate  Therapist will help Andrea Colon to identify and disengage from maladaptive thoughts and behaviors using CBT-based strategies Therapist will provide emotion regulation strategies including mindfulness, relaxation, and self care techniques Diagnosis Axis none 300.02 (Generalized anxiety disorder) - Open - [Signifier: n/a]    Axis none 314.01 (Attention deficit disorder with hyperactivity) - Open - [Signifier: n/a]    Conditions For Discharge Achievement of treatment goals and objectives   [name], PhD               [name], PhD

## 2023-10-31 ENCOUNTER — Other Ambulatory Visit: Payer: 59

## 2023-11-05 ENCOUNTER — Other Ambulatory Visit: Payer: 59 | Attending: Oncology

## 2023-11-07 ENCOUNTER — Ambulatory Visit (INDEPENDENT_AMBULATORY_CARE_PROVIDER_SITE_OTHER): Payer: 59 | Admitting: Clinical

## 2023-11-07 DIAGNOSIS — F411 Generalized anxiety disorder: Secondary | ICD-10-CM

## 2023-11-07 DIAGNOSIS — F331 Major depressive disorder, recurrent, moderate: Secondary | ICD-10-CM

## 2023-11-07 DIAGNOSIS — F902 Attention-deficit hyperactivity disorder, combined type: Secondary | ICD-10-CM | POA: Diagnosis not present

## 2023-11-07 NOTE — Progress Notes (Signed)
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 She reported no self-harm in the past week. Session began by discussing dynamics in her relationship with her son. Andrea Colon also brought up feeling upset with the therapist related to topics discussed during her last session, and therapist processed this with her. She has begun attending comprehensive DBT therapy, and has an appointment scheduled for next week. She is scheduled to be seen again in one week.  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  Client Treatment Preferences  Andrea Colon prefers in-person sessions, during school hours  Client Statement of Needs  Andrea Colon shared that she is seeking parenting skills, as well as healthier coping skills.  Treatment Level  Weekly  Symptoms  Anxiety : restlessness, difficulty relaxing, racing thoughts, difficulty concentrating, difficulty with creating and adhering to plans and routines (Status: maintained). Depression: tearfulness (Status: maintained).  Problems Addressed  New Description, New Description  Goals 1. Andrea Colon would like to process past experiences in order to better understand how they impact her at present Objective Andrea Colon will increase ability to connect to and understand emotional experiences, especially those related to past traumas  Target Date: 2024-03-26 Frequency: Weekly  Progress: 0 Modality: individual  Related Interventions Therapist will help Andrea Colon to consider organizational strategies in her day-to-day life, incorporating manualized treatments such as [name] Therapist will provide opportunities to process parenting experiences in session and incorporate strategies from manualized parent training programs 2. Andrea Colon frequently experiences feelings of  overwhelm that are difficult to cope with Objective Andrea Colon would like to develop coping strategies and reduce the frequency of meltdowns  Target Date: 2024-03-26 Frequency: Weekly  Progress: 0 Modality: individual  Related Interventions Andrea Colon will be provided opportunities to process experiences in sessions Therapist will provide referrals for additional resources as appropriate  Therapist will help Andrea Colon to identify and disengage from maladaptive thoughts and behaviors using CBT-based strategies Therapist will provide emotion regulation strategies including mindfulness, relaxation, and self care techniques Diagnosis Axis none 300.02 (Generalized anxiety disorder) - Open - [Signifier: n/a]    Axis none 314.01 (Attention deficit disorder with hyperactivity) - Open - [Signifier: n/a]    Conditions For Discharge Achievement of treatment goals and objectives    Andrea [name], PhD               Andrea [name], PhD

## 2023-11-12 ENCOUNTER — Encounter: Payer: Self-pay | Admitting: Family Medicine

## 2023-11-13 ENCOUNTER — Encounter: Payer: Self-pay | Admitting: Family Medicine

## 2023-11-13 ENCOUNTER — Ambulatory Visit: Payer: 59 | Admitting: Clinical

## 2023-11-13 ENCOUNTER — Ambulatory Visit: Payer: 59 | Admitting: Family Medicine

## 2023-11-13 ENCOUNTER — Telehealth: Payer: Self-pay | Admitting: Gastroenterology

## 2023-11-13 VITALS — BP 120/80 | HR 84 | Temp 98.3°F | Ht 64.0 in | Wt 234.0 lb

## 2023-11-13 DIAGNOSIS — F330 Major depressive disorder, recurrent, mild: Secondary | ICD-10-CM

## 2023-11-13 DIAGNOSIS — F3281 Premenstrual dysphoric disorder: Secondary | ICD-10-CM

## 2023-11-13 DIAGNOSIS — R140 Abdominal distension (gaseous): Secondary | ICD-10-CM | POA: Insufficient documentation

## 2023-11-13 DIAGNOSIS — F902 Attention-deficit hyperactivity disorder, combined type: Secondary | ICD-10-CM

## 2023-11-13 DIAGNOSIS — N938 Other specified abnormal uterine and vaginal bleeding: Secondary | ICD-10-CM | POA: Diagnosis not present

## 2023-11-13 DIAGNOSIS — F411 Generalized anxiety disorder: Secondary | ICD-10-CM

## 2023-11-13 DIAGNOSIS — F331 Major depressive disorder, recurrent, moderate: Secondary | ICD-10-CM | POA: Diagnosis not present

## 2023-11-13 NOTE — Assessment & Plan Note (Signed)
Acute, unclear etiology. Possibly secondary to progesterone containing birth control although symptoms may have been occurring prior to initiation of this medication. She noticed some improvement in epigastric pain with Nexium course and is now off Nexium.  Her medical bariatric doctor, Dr. Earlene Plater plans to have her return for H. pylori once off PPI.  We also discussed the possibility of SIBO and possible trial of rifaximin as considered by Dr. Earlene Plater.. I recommended following up with Dr. Allegra Lai for possible endoscopy if symptoms not improving and H. pylori testing negative.

## 2023-11-13 NOTE — Patient Instructions (Addendum)
Can try H. Pylori testing after off PPI 2 week.  If not bettter and negative testing .Marland Kitchen Call Phone: [phone] Dr. Allegra Lai for GI work up. Stop Slynd..  When off lexapro and move forward with on boarding [place]rist[place].

## 2023-11-13 NOTE — Progress Notes (Signed)
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 She reported no self-harm in the past week. Session began by discussing dynamics in her relationship with her son and in her marriage. She also reflected upon feelings of anger she had had toward the therapist, and therapist processed with her. She had to cancel her appointment in two weeks, and is scheduled to be seen again in three weeks. Therapist will reach out if another appointment opens up sooner.  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  Client Treatment Preferences  [name] prefers in-person sessions, during school hours  Client Statement of Needs  [name] shared that she is seeking parenting skills, as well as healthier coping skills.  Treatment Level  Weekly  Symptoms  Anxiety : restlessness, difficulty relaxing, racing thoughts, difficulty concentrating, difficulty with creating and adhering to plans and routines (Status: maintained). Depression: tearfulness (Status: maintained).  Problems Addressed  New Description, New Description  Goals 1. [name] would like to process past experiences in order to better understand how they impact her at present Objective [name] will increase ability to connect to and understand emotional experiences, especially those related to past traumas  Target Date: 2024-03-26 Frequency: Weekly  Progress: 0 Modality: individual  Related Interventions Therapist will help Karyss to consider organizational strategies in her day-to-day life, incorporating manualized treatments such as [name] Therapist will provide opportunities to process parenting experiences in session and incorporate strategies from manualized parent training programs 2. [name] frequently experiences feelings of overwhelm  that are difficult to cope with Objective [name] would like to develop coping strategies and reduce the frequency of meltdowns  Target Date: 2024-03-26 Frequency: Weekly  Progress: 0 Modality: individual  Related Interventions Yoni will be provided opportunities to process experiences in sessions Therapist will provide referrals for additional resources as appropriate  Therapist will help Serene to identify and disengage from maladaptive thoughts and behaviors using CBT-based strategies Therapist will provide emotion regulation strategies including mindfulness, relaxation, and self care techniques Diagnosis Axis none 300.02 (Generalized anxiety disorder) - Open - [Signifier: n/a]    Axis none 314.01 (Attention deficit disorder with hyperactivity) - Open - [Signifier: n/a]    Conditions For Discharge Achievement of treatment goals and objectives   [name], PhD               [name], PhD

## 2023-11-13 NOTE — Assessment & Plan Note (Signed)
Acute, most likely secondary to progesterone only OCP, Slynd. Given there is no benefit in PMDD symptoms with starting this medication I recommended stopping this medication.  She has no signs and symptoms today of anemia, recent CBC was normal.  She will follow-up with gynecology as needed.

## 2023-11-13 NOTE — Telephone Encounter (Signed)
OK for APP clinic please RG

## 2023-11-13 NOTE — Progress Notes (Signed)
Patient ID: Andrea Colon, female    DOB: [birth year], 47 y.o.   MRN: [id number]  This visit was conducted in person.  BP 120/80 (BP Location: Left Arm, Patient Position: Sitting, Cuff Size: Large)   Pulse 84   Temp 98.3 F (36.8 C) (Temporal)   Ht 5\' 4"  (1.626 m)   Wt 234 lb (106.1 kg)   SpO2 98%   BMI 40.17 kg/m    CC:  Chief Complaint  Patient presents with   Menstrual Problem    Subjective:   HPI: Andrea Colon is a 47 y.o. female presenting on 11/13/2023 for Menstrual Problem    Did not help with reviewed recent gynecology office visit [facility]  Dr. Chestine Spore September 19, 2023 Felt possibly menstrual irregularity could be due to perimenopause although menses somewhat regular still.  Additional diagnosis of PMDD.  Previously tried lo [employer] and Loestrin ( thought it may have made her it but she is still itching). At most recent office visit recommended trial of Slynd (this afternoon, estrogen free  OCP) Make note of possibly adding estrogen if she is truly having vasomotor symptoms.  She has been on Slynd for last 7 weeks.. she has noted continuous spotting, now progressed to light period.  Now bleeding is more significant, seeing thick blood and clots.  No lightheaded, no dizziness no CP, no SOB.    Changed from lexapro to pristique per Dr. Earlene Plater .Marland Kitchen She is currently transitioning  off lexapro to pristique... feeling mood was better over all.    Some abd bloating, no further epigastric pain. Neg CMET and lipase.  Nexium helped with epigastric pain, but still with blaoting  She has been off Nexium in last 2 weeks... was considering  H.pylori test.  Relevant past medical, surgical, family and social history reviewed and updated as indicated. Interim medical history since our last visit reviewed. Allergies and medications reviewed and updated. Outpatient Medications Prior to Visit  Medication Sig Dispense Refill   Cholecalciferol 100 MCG (4000  UT) CAPS Take 1 capsule by mouth daily.     clonazePAM (KLONOPIN) 0.5 MG tablet Take 1-2 tablets (0.5-1 mg total) by mouth daily as needed for anxiety. 35 tablet 0   escitalopram (LEXAPRO) 5 MG tablet Take 5 mg by mouth at bedtime.     LINZESS 145 MCG CAPS capsule Take 145 mcg by mouth daily.     MAGNESIUM PO Take 1 tablet by mouth in the morning and at bedtime.     Omega-3 Fatty Acids (FISH OIL PO) Take 2,200 mg by mouth daily.     PRISTIQ 50 MG 24 hr tablet Take 50 mg by mouth daily.     SLYND 4 MG TABS Take 1 tablet by mouth daily.     spironolactone (ALDACTONE) 50 MG tablet Take 50 mg by mouth 2 (two) times daily.     tretinoin (RETIN-A) 0.025 % cream as needed.     VYVANSE 50 MG capsule Take 50 mg by mouth every morning.     zaleplon (SONATA) 5 MG capsule Take 1 capsule (5 mg total) by mouth at bedtime as needed for sleep. 30 capsule 0   amphetamine-dextroamphetamine (ADDERALL XR) 20 MG 24 hr capsule Take 20 mg by mouth daily.     Bioflavonoid Products (ESTER-C PO) Take 1,000 mg by mouth daily.     escitalopram (LEXAPRO) 10 MG tablet Take 10 mg by mouth daily.     lamoTRIgine (LAMICTAL) 25 MG tablet Take 50  mg by mouth daily. (Patient not taking: Reported on 09/25/2023)     meloxicam (MOBIC) 15 MG tablet Take 15 mg by mouth daily.     Vitamin D, Cholecalciferol, 25 MCG (1000 UT) TABS Take 4 tablets by mouth daily.     VYVANSE 60 MG capsule Take 60 mg by mouth every morning.     No facility-administered medications prior to visit.     Per HPI unless specifically indicated in ROS section below Review of Systems  Constitutional:  Negative for fatigue and fever.  HENT:  Negative for congestion.   Eyes:  Negative for pain.  Respiratory:  Negative for cough and shortness of breath.   Cardiovascular:  Negative for chest pain, palpitations and leg swelling.  Gastrointestinal:  Negative for abdominal pain.  Genitourinary:  Negative for dysuria and vaginal bleeding.  Musculoskeletal:   Negative for back pain.  Neurological:  Negative for syncope, light-headedness and headaches.  Psychiatric/Behavioral:  Negative for dysphoric mood.    Objective:  BP 120/80 (BP Location: Left Arm, Patient Position: Sitting, Cuff Size: Large)   Pulse 84   Temp 98.3 F (36.8 C) (Temporal)   Ht 5\' 4"  (1.626 m)   Wt 234 lb (106.1 kg)   SpO2 98%   BMI 40.17 kg/m   Wt Readings from Last 3 Encounters:  11/13/23 234 lb (106.1 kg)  09/25/23 225 lb (102.1 kg)  06/20/23 228 lb 2 oz (103.5 kg)      Physical Exam Constitutional:      General: She is not in acute distress.    Appearance: Normal appearance. She is well-developed. She is obese. She is not ill-appearing or toxic-appearing.  HENT:     Head: Normocephalic.     Right Ear: Hearing, tympanic membrane, ear canal and external ear normal. Tympanic membrane is not erythematous, retracted or bulging.     Left Ear: Hearing, tympanic membrane, ear canal and external ear normal. Tympanic membrane is not erythematous, retracted or bulging.     Nose: No mucosal edema or rhinorrhea.     Right Sinus: No maxillary sinus tenderness or frontal sinus tenderness.     Left Sinus: No maxillary sinus tenderness or frontal sinus tenderness.     Mouth/Throat:     Mouth: Oropharynx is clear and moist and mucous membranes are normal.     Pharynx: Uvula midline.  Eyes:     General: Lids are normal. Lids are everted, no foreign bodies appreciated.     Extraocular Movements: EOM normal.     Conjunctiva/sclera: Conjunctivae normal.     Pupils: Pupils are equal, round, and reactive to light.  Neck:     Thyroid: No thyroid mass or thyromegaly.     Vascular: No carotid bruit.     Trachea: Trachea normal.  Cardiovascular:     Rate and Rhythm: Normal rate and regular rhythm.     Pulses: Normal pulses.     Heart sounds: Normal heart sounds, S1 normal and S2 normal. No murmur heard.    No friction rub. No gallop.  Pulmonary:     Effort: Pulmonary effort is  normal. No tachypnea or respiratory distress.     Breath sounds: Normal breath sounds. No decreased breath sounds, wheezing, rhonchi or rales.  Abdominal:     General: Bowel sounds are normal.     Palpations: Abdomen is soft.     Tenderness: There is no abdominal tenderness.  Musculoskeletal:     Cervical back: Normal range of motion and neck supple.  Skin:    General: Skin is warm, dry and intact.     Findings: No rash.  Neurological:     Mental Status: She is alert.  Psychiatric:        Mood and Affect: Mood is not anxious or depressed.        Speech: Speech normal.        Behavior: Behavior normal. Behavior is cooperative.        Thought Content: Thought content normal.        Cognition and Memory: Cognition and memory normal.        Judgment: Judgment normal.       Results for orders placed or performed in visit on 09/25/23  CBC with Differential/Platelet  Result Value Ref Range   WBC 11.0 (H) 4.0 - 10.5 K/uL   RBC 4.58 3.87 - 5.11 Mil/uL   Hemoglobin 13.8 12.0 - 15.0 g/dL   HCT 96.0 45.4 - 09.8 %   MCV 86.9 78.0 - 100.0 fl   MCHC 34.6 30.0 - 36.0 g/dL   RDW 11.9 14.7 - 82.9 %   Platelets 377.0 150.0 - 400.0 K/uL   Neutrophils Relative % 71.5 43.0 - 77.0 %   Lymphocytes Relative 19.4 12.0 - 46.0 %   Monocytes Relative 6.4 3.0 - 12.0 %   Eosinophils Relative 1.6 0.0 - 5.0 %   Basophils Relative 1.1 0.0 - 3.0 %   Neutro Abs 7.8 (H) 1.4 - 7.7 K/uL   Lymphs Abs 2.1 0.7 - 4.0 K/uL   Monocytes Absolute 0.7 0.1 - 1.0 K/uL   Eosinophils Absolute 0.2 0.0 - 0.7 K/uL   Basophils Absolute 0.1 0.0 - 0.1 K/uL  Comprehensive metabolic panel  Result Value Ref Range   Sodium 135 135 - 145 mEq/L   Potassium 4.0 3.5 - 5.1 mEq/L   Chloride 99 96 - 112 mEq/L   CO2 28 19 - 32 mEq/L   Glucose, Bld 86 70 - 99 mg/dL   BUN 11 6 - 23 mg/dL   Creatinine, Ser 5.62 0.40 - 1.20 mg/dL   Total Bilirubin 0.3 0.2 - 1.2 mg/dL   Alkaline Phosphatase 43 39 - 117 U/L   AST 13 0 - 37 U/L   ALT 9  0 - 35 U/L   Total Protein 7.0 6.0 - 8.3 g/dL   Albumin 4.3 3.5 - 5.2 g/dL   GFR 13.08 >65.78 mL/min   Calcium 9.6 8.4 - 10.5 mg/dL  T4, free  Result Value Ref Range   Free T4 0.67 0.60 - 1.60 ng/dL  T3, free  Result Value Ref Range   T3, Free 3.9 2.3 - 4.2 pg/mL  TSH  Result Value Ref Range   TSH 1.40 0.35 - 5.50 uIU/mL    Assessment and Plan  DUB (dysfunctional uterine bleeding) Assessment & Plan: Acute, most likely secondary to progesterone only OCP, Slynd. Given there is no benefit in PMDD symptoms with starting this medication I recommended stopping this medication.  She has no signs and symptoms today of anemia, recent CBC was normal.  She will follow-up with gynecology as needed.   Depression, major, recurrent, mild (HCC) Assessment & Plan: Chronic, significant improvement with changing Lexapro to Pristiq per genetic evaluation.  She is still working on Therapist, sports.  This has had a more significant effect on her mood disruption prior to her menses then starting this on.   Abdominal bloating Assessment & Plan: Acute, unclear etiology. Possibly secondary to progesterone containing birth control although symptoms  may have been occurring prior to initiation of this medication. She noticed some improvement in epigastric pain with Nexium course and is now off Nexium.  Her medical bariatric doctor, Dr. Earlene Plater plans to have her return for H. pylori once off PPI.  We also discussed the possibility of SIBO and possible trial of rifaximin as considered by Dr. Earlene Plater.. I recommended following up with Dr. Allegra Lai for possible endoscopy if symptoms not improving and H. pylori testing negative.   PMDD (premenstrual dysphoric disorder)    No follow-ups on file.   Kerby Nora, MD

## 2023-11-13 NOTE — Telephone Encounter (Signed)
Good morning Dr. Chales Abrahams  Supervising MD AM  The following patient is being referred to [place] for epigastric pain. She has seen [facility] recently for a colonoscopy and will not continue there because she never established care and wants to come to our facility. Records are available on Epic. Please review and advise of scheduling. Thank you.

## 2023-11-13 NOTE — Assessment & Plan Note (Signed)
Chronic, significant improvement with changing Lexapro to Pristiq per genetic evaluation.  She is still working on Therapist, sports.  This has had a more significant effect on her mood disruption prior to her menses then starting this on.

## 2023-11-16 ENCOUNTER — Other Ambulatory Visit: Payer: Self-pay | Admitting: Family Medicine

## 2023-11-17 MED ORDER — ZALEPLON 5 MG PO CAPS: 5.0000 mg | ORAL_CAPSULE | Freq: Every evening | ORAL | 0 refills | Status: DC | PRN

## 2023-11-17 MED ORDER — CLONAZEPAM 0.5 MG PO TABS: 0.5000 mg | ORAL_TABLET | Freq: Every day | ORAL | 0 refills | Status: DC | PRN

## 2023-11-17 NOTE — Telephone Encounter (Signed)
Last office visit 11/13/23 for DUB.  Last refilled zaleplon 10/07/2023 for #30 with no refills.  Clonazepam 10/07/2023 for #35 with no refills.  Next Appt: No future appointments with PCP.

## 2023-11-26 ENCOUNTER — Ambulatory Visit: Payer: 59 | Admitting: Clinical

## 2023-12-04 ENCOUNTER — Ambulatory Visit: Payer: 59 | Admitting: Clinical

## 2023-12-04 DIAGNOSIS — F331 Major depressive disorder, recurrent, moderate: Secondary | ICD-10-CM | POA: Diagnosis not present

## 2023-12-04 DIAGNOSIS — F411 Generalized anxiety disorder: Secondary | ICD-10-CM | POA: Diagnosis not present

## 2023-12-04 DIAGNOSIS — F902 Attention-deficit hyperactivity disorder, combined type: Secondary | ICD-10-CM

## 2023-12-04 NOTE — Progress Notes (Signed)
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 She reported no self-harm in the past week. Session began with Abeeha sharing an update on her DBT sessions so far. She has been participating actively in individual and group DBT sessions, and shared several worksheets she had completed. Session also included discussion of an incident that had taken place with her son. Therapist offered validation and support, and pointed out the connection between anger at her son and feelings of shame. Evalene is scheduled to be seen again in one week.  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 She expresses herself well in writing.  Client Treatment Preferences  Gladis prefers in-person sessions, during school hours  Client Statement of Needs  Nakisa shared that she is seeking parenting skills, as well as healthier coping skills.  Treatment Level  Weekly  Symptoms  Anxiety : restlessness, difficulty relaxing, racing thoughts, difficulty concentrating, difficulty with creating and adhering to plans and routines (Status: maintained). Depression: tearfulness (Status: maintained).  Problems Addressed  New Description, New Description  Goals 1. Aariya would like to process past experiences in order to better understand how they impact her at present Objective Carneisha will increase ability to connect to and understand emotional experiences, especially those related to past traumas  Target Date: 2024-03-26 Frequency: Weekly  Progress: 0 Modality: individual  Related Interventions Therapist will help Arthelia to consider organizational strategies in her day-to-day life, incorporating manualized treatments such as [name] Therapist will provide opportunities to process parenting experiences in session and incorporate strategies from  manualized parent training programs 2. Coriann frequently experiences feelings of overwhelm that are difficult to cope with Objective Jacelin would like to develop coping strategies and reduce the frequency of meltdowns  Target Date: 2024-03-26 Frequency: Weekly  Progress: 0 Modality: individual  Related Interventions Guiseppina will be provided opportunities to process experiences in sessions Therapist will provide referrals for additional resources as appropriate  Therapist will help Janyiah to identify and disengage from maladaptive thoughts and behaviors using CBT-based strategies Therapist will provide emotion regulation strategies including mindfulness, relaxation, and self care techniques Diagnosis Axis none 300.02 (Generalized anxiety disorder) - Open - [Signifier: n/a]    Axis none 314.01 (Attention deficit disorder with hyperactivity) - Open - [Signifier: n/a]    Conditions For Discharge Achievement of treatment goals and objectives      [name], PhD               [name], PhD

## 2023-12-11 ENCOUNTER — Ambulatory Visit: Payer: 59 | Admitting: Clinical

## 2023-12-11 DIAGNOSIS — F411 Generalized anxiety disorder: Secondary | ICD-10-CM

## 2023-12-11 DIAGNOSIS — F902 Attention-deficit hyperactivity disorder, combined type: Secondary | ICD-10-CM

## 2023-12-11 DIAGNOSIS — F331 Major depressive disorder, recurrent, moderate: Secondary | ICD-10-CM | POA: Diagnosis not present

## 2023-12-11 NOTE — Progress Notes (Signed)
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 She reported no self-harm in the past week, and noted continued mood stabilization through medication adjustment. Session focused on continuing to process dynamics in the therapeutic relationship. Andrea Colon connected her feeling of abandonment and rejection to a sense of her relationship with her mother as having been tentative. Andrea Colon encouraged her to communicate her feelings openly in session. She is scheduled to be seen again in three weeks.  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  Client Treatment Preferences  Andrea Colon prefers in-person sessions, during school hours  Client Statement of Needs  Andrea Colon shared that she is seeking parenting skills, as well as healthier coping skills.  Treatment Level  Weekly  Symptoms  Anxiety : restlessness, difficulty relaxing, racing thoughts, difficulty concentrating, difficulty with creating and adhering to plans and routines (Status: maintained). Depression: tearfulness (Status: maintained).  Problems Addressed  New Description, New Description  Goals 1. Andrea Colon would like to process past experiences in order to better understand how they impact her at present Objective Andrea Colon will increase ability to connect to and understand emotional experiences, especially those related to past traumas  Target Date: 2024-03-26 Frequency: Weekly  Progress: 0 Modality: individual  Related Interventions Andrea Colon will help Andrea Colon to consider organizational strategies in her day-to-day life, incorporating manualized treatments such as [name] Andrea Colon will provide opportunities to process parenting experiences in session and incorporate strategies from manualized parent training programs 2. Andrea Colon  frequently experiences feelings of overwhelm that are difficult to cope with Objective Andrea Colon would like to develop coping strategies and reduce the frequency of meltdowns  Target Date: 2024-03-26 Frequency: Weekly  Progress: 0 Modality: individual  Related Interventions Andrea Colon will be provided opportunities to process experiences in sessions Andrea Colon will provide referrals for additional resources as appropriate  Andrea Colon will help Andrea Colon to identify and disengage from maladaptive thoughts and behaviors using CBT-based strategies Andrea Colon will provide emotion regulation strategies including mindfulness, relaxation, and self care techniques Diagnosis Axis none 300.02 (Generalized anxiety disorder) - Open - [Signifier: n/a]    Axis none 314.01 (Attention deficit disorder with hyperactivity) - Open - [Signifier: n/a]    Conditions For Discharge Achievement of treatment goals and objectives   [name], PhD               [name], PhD

## 2023-12-14 ENCOUNTER — Other Ambulatory Visit: Payer: Self-pay | Admitting: Family Medicine

## 2023-12-15 MED ORDER — ZALEPLON 5 MG PO CAPS: 5.0000 mg | ORAL_CAPSULE | Freq: Every evening | ORAL | 0 refills | Status: DC | PRN

## 2023-12-15 MED ORDER — CLONAZEPAM 0.5 MG PO TABS: 0.5000 mg | ORAL_TABLET | Freq: Every day | ORAL | 0 refills | Status: DC | PRN

## 2023-12-15 NOTE — Telephone Encounter (Signed)
Last office visit 11/13/2023 four DUB.  Last refilled Sonata 11/17/2023 for #30 with no refills.  Clonazepam 11/17/2023 for #35 with no refills.  Next Appt: No future appointments with PCP.

## 2023-12-31 ENCOUNTER — Ambulatory Visit: Payer: 59 | Admitting: Clinical

## 2024-01-02 ENCOUNTER — Ambulatory Visit: Payer: 59 | Admitting: Clinical

## 2024-01-02 DIAGNOSIS — F411 Generalized anxiety disorder: Secondary | ICD-10-CM

## 2024-01-02 DIAGNOSIS — F331 Major depressive disorder, recurrent, moderate: Secondary | ICD-10-CM

## 2024-01-02 DIAGNOSIS — F902 Attention-deficit hyperactivity disorder, combined type: Secondary | ICD-10-CM | POA: Diagnosis not present

## 2024-01-02 NOTE — Progress Notes (Signed)
 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 She and the therapist were in their homes at time of visit. Laverle is aware of risks of telehealth and consented to a virtual visit. She reported no self-harm in the past few weeks, and has maintained regular participation in DBT Therapy. Session focused on events that had transpired over the holidays, as well as application of skills she has learned in DBT. She is scheduled to be seen again in one week.   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 She expresses herself well in writing.  Client Treatment Preferences  Saima prefers in-person sessions, during school hours  Client Statement of Needs  Caitlain shared that she is seeking parenting skills, as well as healthier coping skills.  Treatment Level  Weekly  Symptoms  Anxiety : restlessness, difficulty relaxing, racing thoughts, difficulty concentrating, difficulty with creating and adhering to plans and routines (Status: maintained). Depression: tearfulness (Status: maintained).  Problems Addressed  New Description, New Description  Goals 1. Briseidy would like to process past experiences in order to better understand how they impact her at present Objective Caitlain will increase ability to connect to and understand emotional experiences, especially those related to past traumas  Target Date: 2024-03-26 Frequency: Weekly  Progress: 0 Modality: individual  Related Interventions Therapist will help Athene to consider organizational strategies in her day-to-day life, incorporating manualized treatments such as [name] Therapist will provide opportunities to process parenting experiences in session and incorporate strategies from manualized parent training programs 2. Perina frequently experiences feelings of  overwhelm that are difficult to cope with Objective Tenasia would like to develop coping strategies and reduce the frequency of meltdowns  Target Date: 2024-03-26 Frequency: Weekly  Progress: 0 Modality: individual  Related Interventions Anntionette will be provided opportunities to process experiences in sessions Therapist will provide referrals for additional resources as appropriate  Therapist will help Selby to identify and disengage from maladaptive thoughts and behaviors using CBT-based strategies Therapist will provide emotion regulation strategies including mindfulness, relaxation, and self care techniques Diagnosis Axis none 300.02 (Generalized anxiety disorder) - Open - [Signifier: n/a]    Axis none 314.01 (Attention deficit disorder with hyperactivity) - Open - [Signifier: n/a]    Conditions For Discharge Achievement of treatment goals and objectives       Andriette LITTIE Ponto, PhD               Andriette LITTIE Ponto, PhD

## 2024-01-07 ENCOUNTER — Ambulatory Visit: Payer: Self-pay | Admitting: Family Medicine

## 2024-01-07 ENCOUNTER — Ambulatory Visit: Payer: 59 | Admitting: Family Medicine

## 2024-01-07 NOTE — Telephone Encounter (Signed)
 Chief Complaint: Sacroiliac joint pain Symptoms: SI joint pain that radiates down the pt's left leg Frequency: Onset 2-3 days ago, with hx of the same "for forever" Pertinent Negatives: Patient denies weakness, numbness, tingling, loss of bowel/bladder control Disposition: [] ED /[] Urgent Care (no appt availability in office) / [x] Appointment(In office/virtual)/ []  [facility]/ [] Home Care/ [] Refused Recommended Disposition /[] [facility]/ []  Follow-up with PCP Additional Notes: Pt reports 7/10 SI joint pain that radiates down her left leg. Pt reports hx of the same "forever." Pt reports recent diagnosis of gastritis and is concerned about taking ibuprofen. Patient given care advice with recommendations for pain medication. Pt has an appt scheduled for tomorrow, per protocol this RN advised pt to keep appt. Pt verbalized understanding.    Copied from CRM [id number]. Topic: Clinical - Red Word Triage >> Jan 07, 2024 11:35 AM Tiffany H wrote: [facility] that prompted transfer to Nurse Triage: PAIN.   SI Joint is pinching in back. Patient has rescheduled twice. Pain is shooting down left leg. Please assist. Reason for Disposition  [1] Pain radiates into the thigh or further down the leg AND [2] one leg  Answer Assessment - Initial Assessment Questions 1. ONSET: "When did the pain begin?"      Last 2-3 days 2. LOCATION: "Where does it hurt?" (upper, mid or lower back)     Lower back and radiating down the left leg 3. SEVERITY: "How bad is the pain?"  (e.g., Scale 1-10; mild, moderate, or severe)   - MILD (1-3): Doesn't interfere with normal activities.    - MODERATE (4-7): Interferes with normal activities or awakens from sleep.    - SEVERE (8-10): Excruciating pain, unable to do any normal activities.      7/10 4. PATTERN: "Is the pain constant?" (e.g., yes, no; constant, intermittent)      Constant 5. RADIATION: "Does the pain shoot into your legs or somewhere else?"      Radiates to the left leg 6. CAUSE:  "What do you think is causing the back pain?"      Hx of back pain, recently lifting around Christmas 7. BACK OVERUSE:  "Any recent lifting of heavy objects, strenuous work or exercise?"     "Drug a lot of stuff around over Christmas" 8. MEDICINES: "What have you taken so far for the pain?" (e.g., nothing, acetaminophen , NSAIDS)     Nothing 9. NEUROLOGIC SYMPTOMS: "Do you have any weakness, numbness, or problems with bowel/bladder control?"     None 10. OTHER SYMPTOMS: "Do you have any other symptoms?" (e.g., fever, abdomen pain, burning with urination, blood in urine)       None 11. PREGNANCY: "Is there any chance you are pregnant?" "When was your last menstrual period?"       No  Protocols used: Back Pain-A-AH

## 2024-01-07 NOTE — Telephone Encounter (Signed)
 Noted.

## 2024-01-08 ENCOUNTER — Ambulatory Visit: Payer: 59 | Admitting: Clinical

## 2024-01-08 ENCOUNTER — Ambulatory Visit (INDEPENDENT_AMBULATORY_CARE_PROVIDER_SITE_OTHER): Payer: 59 | Admitting: Family Medicine

## 2024-01-08 ENCOUNTER — Encounter: Payer: Self-pay | Admitting: Family Medicine

## 2024-01-08 VITALS — BP 120/74 | HR 84 | Temp 97.8°F | Ht 64.0 in | Wt 243.4 lb

## 2024-01-08 DIAGNOSIS — M5442 Lumbago with sciatica, left side: Secondary | ICD-10-CM | POA: Diagnosis not present

## 2024-01-08 DIAGNOSIS — G8929 Other chronic pain: Secondary | ICD-10-CM | POA: Diagnosis not present

## 2024-01-08 DIAGNOSIS — F331 Major depressive disorder, recurrent, moderate: Secondary | ICD-10-CM

## 2024-01-08 DIAGNOSIS — F411 Generalized anxiety disorder: Secondary | ICD-10-CM | POA: Diagnosis not present

## 2024-01-08 DIAGNOSIS — F902 Attention-deficit hyperactivity disorder, combined type: Secondary | ICD-10-CM

## 2024-01-08 DIAGNOSIS — M533 Sacrococcygeal disorders, not elsewhere classified: Secondary | ICD-10-CM

## 2024-01-08 MED ORDER — PREDNISONE 20 MG PO TABS: ORAL_TABLET | ORAL | 0 refills | Status: DC

## 2024-01-08 NOTE — Progress Notes (Signed)
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 She and the therapist were in their homes at time of visit. Nandi is aware of risks of telehealth and consented to a virtual visit. She reported no self-harm in the past few weeks. Her most recent DBT appointment was canceled by the therapist, but she plans to return next week. Session focused on a challenging morning she had had with her son, as well as his upcoming GI cleanout. Therapist offered validation and support, reframing negative self-talk. She is scheduled to be seen again in one week. 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 She expresses herself well in writing.  Client Treatment Preferences  Jakevia prefers in-person sessions, during school hours  Client Statement of Needs  Lynze shared that she is seeking parenting skills, as well as healthier coping skills.  Treatment Level  Weekly  Symptoms  Anxiety : restlessness, difficulty relaxing, racing thoughts, difficulty concentrating, difficulty with creating and adhering to plans and routines (Status: maintained). Depression: tearfulness (Status: maintained).  Problems Addressed  New Description, New Description  Goals 1. Jeilin would like to process past experiences in order to better understand how they impact her at present Objective Sakora will increase ability to connect to and understand emotional experiences, especially those related to past traumas  Target Date: 2024-03-26 Frequency: Weekly  Progress: 0 Modality: individual  Related Interventions Therapist will help Gennifer to consider organizational strategies in her day-to-day life, incorporating manualized treatments such as [name] Therapist will provide opportunities to process parenting experiences in session and incorporate  strategies from manualized parent training programs 2. Makaelah frequently experiences feelings of overwhelm that are difficult to cope with Objective Merlina would like to develop coping strategies and reduce the frequency of meltdowns  Target Date: 2024-03-26 Frequency: Weekly  Progress: 0 Modality: individual  Related Interventions Terron will be provided opportunities to process experiences in sessions Therapist will provide referrals for additional resources as appropriate  Therapist will help Sephira to identify and disengage from maladaptive thoughts and behaviors using CBT-based strategies Therapist will provide emotion regulation strategies including mindfulness, relaxation, and self care techniques Diagnosis Axis none 300.02 (Generalized anxiety disorder) - Open - [Signifier: n/a]    Axis none 314.01 (Attention deficit disorder with hyperactivity) - Open - [Signifier: n/a]    Conditions For Discharge Achievement of treatment goals and objectives        [name], PhD               [name], PhD

## 2024-01-08 NOTE — Progress Notes (Signed)
Patient ID: Andrea Colon, female    DOB: Jan 31, 1976, 48 y.o.   MRN: [id number]  This visit was conducted in person.  BP 120/74 (BP Location: Right Arm, Patient Position: Sitting, Cuff Size: Large)   Pulse 84   Temp 97.8 F (36.6 C) (Temporal)   Ht 5\' 4"  (1.626 m)   Wt 243 lb 6 oz (110.4 kg)   LMP 01/05/2024   SpO2 96%   BMI 41.78 kg/m    CC:  Chief Complaint  Patient presents with   SI Joint    Subjective:   HPI: Andrea Colon is a 48 y.o. female presenting on 01/08/2024 for SI Joint    Flare up of  SI joint pain in  last few  days.   Has been more physical lately, some lifting.  Feels pop in left anterior hip with flexing leg, sharp pain in left SI joint.  Yesterday pain radiate down left leg.  Now with some improving in pain today.   No weakness, no numbness in left  leg.   Has history of similar SI joint bilaterally.   Has been dealing with GI issue.Marland Kitchen trying to avoid ibuprofen... usually this helps. Using heat. Tylenol not helping.          Relevant past medical, surgical, family and social history reviewed and updated as indicated. Interim medical history since our last visit reviewed. Allergies and medications reviewed and updated. Outpatient Medications Prior to Visit  Medication Sig Dispense Refill   Cholecalciferol 100 MCG (4000 UT) CAPS Take 1 capsule by mouth daily.     clonazePAM (KLONOPIN) 0.5 MG tablet Take 1-2 tablets (0.5-1 mg total) by mouth daily as needed for anxiety. 35 tablet 0   escitalopram (LEXAPRO) 5 MG tablet Take 2.5 mg by mouth at bedtime.     LINZESS 145 MCG CAPS capsule Take 145 mcg by mouth daily.     MAGNESIUM PO Take 1 tablet by mouth in the morning and at bedtime.     Omega-3 Fatty Acids (FISH OIL PO) Take 2,200 mg by mouth daily.     PRISTIQ 50 MG 24 hr tablet Take 50 mg by mouth daily.     tretinoin (RETIN-A) 0.025 % cream as needed.     VYVANSE 50 MG capsule Take 50 mg by mouth every morning.     zaleplon  (SONATA) 5 MG capsule Take 1 capsule (5 mg total) by mouth at bedtime as needed for sleep. 30 capsule 0   SLYND 4 MG TABS Take 1 tablet by mouth daily.     spironolactone (ALDACTONE) 50 MG tablet Take 50 mg by mouth 2 (two) times daily.     No facility-administered medications prior to visit.     Per HPI unless specifically indicated in ROS section below Review of Systems  Constitutional:  Negative for fatigue and fever.  HENT:  Negative for congestion.   Eyes:  Negative for pain.  Respiratory:  Negative for cough and shortness of breath.   Cardiovascular:  Negative for chest pain, palpitations and leg swelling.  Gastrointestinal:  Negative for abdominal pain.  Genitourinary:  Negative for dysuria and vaginal bleeding.  Musculoskeletal:  Negative for back pain.  Neurological:  Negative for syncope, light-headedness and headaches.  Psychiatric/Behavioral:  Negative for dysphoric mood.    Objective:  BP 120/74 (BP Location: Right Arm, Patient Position: Sitting, Cuff Size: Large)   Pulse 84   Temp 97.8 F (36.6 C) (Temporal)   Ht 5\' 4"  (1.626  m)   Wt 243 lb 6 oz (110.4 kg)   LMP 01/05/2024   SpO2 96%   BMI 41.78 kg/m   Wt Readings from Last 3 Encounters:  01/08/24 243 lb 6 oz (110.4 kg)  11/13/23 234 lb (106.1 kg)  09/25/23 225 lb (102.1 kg)      Physical Exam Constitutional:      General: She is not in acute distress.    Appearance: Normal appearance. She is well-developed. She is not ill-appearing or toxic-appearing.  HENT:     Head: Normocephalic.     Right Ear: Hearing, tympanic membrane, ear canal and external ear normal. Tympanic membrane is not erythematous, retracted or bulging.     Left Ear: Hearing, tympanic membrane, ear canal and external ear normal. Tympanic membrane is not erythematous, retracted or bulging.     Nose: No mucosal edema or rhinorrhea.     Right Sinus: No maxillary sinus tenderness or frontal sinus tenderness.     Left Sinus: No maxillary sinus  tenderness or frontal sinus tenderness.     Mouth/Throat:     Pharynx: Uvula midline.  Eyes:     General: Lids are normal. Lids are everted, no foreign bodies appreciated.     Conjunctiva/sclera: Conjunctivae normal.     Pupils: Pupils are equal, round, and reactive to light.  Neck:     Thyroid: No thyroid mass or thyromegaly.     Vascular: No carotid bruit.     Trachea: Trachea normal.  Cardiovascular:     Rate and Rhythm: Normal rate and regular rhythm.     Pulses: Normal pulses.     Heart sounds: Normal heart sounds, S1 normal and S2 normal. No murmur heard.    No friction rub. No gallop.  Pulmonary:     Effort: Pulmonary effort is normal. No tachypnea or respiratory distress.     Breath sounds: Normal breath sounds. No decreased breath sounds, wheezing, rhonchi or rales.  Abdominal:     General: Bowel sounds are normal.     Palpations: Abdomen is soft.     Tenderness: There is no abdominal tenderness.  Musculoskeletal:     Cervical back: Normal, normal range of motion and neck supple.     Thoracic back: Normal.     Lumbar back: Tenderness present. No bony tenderness. Normal range of motion. Negative right straight leg raise test and negative left straight leg raise test.     Comments:  Ttp over left SI joint, negative faber's, no anterior hip pain,  Some sciatic notch pain  Skin:    General: Skin is warm and dry.     Findings: No rash.  Neurological:     Mental Status: She is alert.  Psychiatric:        Mood and Affect: Mood is not anxious or depressed.        Speech: Speech normal.        Behavior: Behavior normal. Behavior is cooperative.        Thought Content: Thought content normal.        Judgment: Judgment normal.       Results for orders placed or performed in visit on 09/25/23  CBC with Differential/Platelet   Collection Time: 09/25/23  4:26 PM  Result Value Ref Range   WBC 11.0 (H) 4.0 - 10.5 K/uL   RBC 4.58 3.87 - 5.11 Mil/uL   Hemoglobin 13.8 12.0 -  15.0 g/dL   HCT 40.9 81.1 - 91.4 %   MCV 86.9 78.0 -  100.0 fl   MCHC 34.6 30.0 - 36.0 g/dL   RDW 82.9 56.2 - 13.0 %   Platelets 377.0 150.0 - 400.0 K/uL   Neutrophils Relative % 71.5 43.0 - 77.0 %   Lymphocytes Relative 19.4 12.0 - 46.0 %   Monocytes Relative 6.4 3.0 - 12.0 %   Eosinophils Relative 1.6 0.0 - 5.0 %   Basophils Relative 1.1 0.0 - 3.0 %   Neutro Abs 7.8 (H) 1.4 - 7.7 K/uL   Lymphs Abs 2.1 0.7 - 4.0 K/uL   Monocytes Absolute 0.7 0.1 - 1.0 K/uL   Eosinophils Absolute 0.2 0.0 - 0.7 K/uL   Basophils Absolute 0.1 0.0 - 0.1 K/uL  Comprehensive metabolic panel   Collection Time: 09/25/23  4:26 PM  Result Value Ref Range   Sodium 135 135 - 145 mEq/L   Potassium 4.0 3.5 - 5.1 mEq/L   Chloride 99 96 - 112 mEq/L   CO2 28 19 - 32 mEq/L   Glucose, Bld 86 70 - 99 mg/dL   BUN 11 6 - 23 mg/dL   Creatinine, Ser 8.65 0.40 - 1.20 mg/dL   Total Bilirubin 0.3 0.2 - 1.2 mg/dL   Alkaline Phosphatase 43 39 - 117 U/L   AST 13 0 - 37 U/L   ALT 9 0 - 35 U/L   Total Protein 7.0 6.0 - 8.3 g/dL   Albumin 4.3 3.5 - 5.2 g/dL   GFR 78.46 >96.29 mL/min   Calcium 9.6 8.4 - 10.5 mg/dL  T4, free   Collection Time: 09/25/23  4:26 PM  Result Value Ref Range   Free T4 0.67 0.60 - 1.60 ng/dL  T3, free   Collection Time: 09/25/23  4:26 PM  Result Value Ref Range   T3, Free 3.9 2.3 - 4.2 pg/mL  TSH   Collection Time: 09/25/23  4:26 PM  Result Value Ref Range   TSH 1.40 0.35 - 5.50 uIU/mL    Assessment and Plan  Chronic left SI joint pain  Acute left-sided low back pain with left-sided sciatica  Other orders -     predniSONE; 3 tabs by mouth daily x 3 days, then 2 tabs by mouth daily x 2 days then 1 tab by mouth daily x 2 days  Dispense: 15 tablet; Refill: 0  Acute flare of chronic issue now with possible left sciatic nerve irritation.  She will receive restart home physical therapy for stretching SI joint, information provided.  We will treat with prednisone taper to alleviate  inflammation.  She will continue to avoid NSAIDs given GI issues. Continue heat on low back.  Discussed low back, core strengthening and SI joint strengthening exercises. She is not currently interested in referral for formal physical therapy, may consider in the future.  Follow-up for reevaluation if not improving in 2 to 4 weeks.  No follow-ups on file.   Kerby Nora, MD

## 2024-01-14 ENCOUNTER — Other Ambulatory Visit: Payer: Self-pay | Admitting: Family Medicine

## 2024-01-14 DIAGNOSIS — G8929 Other chronic pain: Secondary | ICD-10-CM

## 2024-01-14 DIAGNOSIS — M533 Sacrococcygeal disorders, not elsewhere classified: Secondary | ICD-10-CM | POA: Insufficient documentation

## 2024-01-14 MED ORDER — CYCLOBENZAPRINE HCL 10 MG PO TABS: 5.0000 mg | ORAL_TABLET | Freq: Every evening | ORAL | 0 refills | Status: DC | PRN

## 2024-01-15 ENCOUNTER — Ambulatory Visit: Payer: 59 | Admitting: Clinical

## 2024-01-15 DIAGNOSIS — F411 Generalized anxiety disorder: Secondary | ICD-10-CM

## 2024-01-15 DIAGNOSIS — F902 Attention-deficit hyperactivity disorder, combined type: Secondary | ICD-10-CM

## 2024-01-15 DIAGNOSIS — F331 Major depressive disorder, recurrent, moderate: Secondary | ICD-10-CM

## 2024-01-15 NOTE — Progress Notes (Signed)
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 She reported that she had not taken her son for his GI cleanout as planned. Session focused on better understanding her avoidance of steps she is advised to take for her own and her son's care. Therapist engaged her in discussion and reflection. Self harm was denied in the past week. She is scheduled to be seen again in one week.  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  Client Treatment Preferences  Andrea Colon prefers in-person sessions, during school hours  Client Statement of Needs  Andrea Colon shared that she is seeking parenting skills, as well as healthier coping skills.  Treatment Level  Weekly  Symptoms  Anxiety : restlessness, difficulty relaxing, racing thoughts, difficulty concentrating, difficulty with creating and adhering to plans and routines (Status: maintained). Depression: tearfulness (Status: maintained).  Problems Addressed  New Description, New Description  Goals 1. Andrea Colon would like to process past experiences in order to better understand how they impact her at present Objective Andrea Colon will increase ability to connect to and understand emotional experiences, especially those related to past traumas  Target Date: 2024-03-26 Frequency: Weekly  Progress: 0 Modality: individual  Related Interventions Therapist will help Carneshia to consider organizational strategies in her day-to-day life, incorporating manualized treatments such as [name] Therapist will provide opportunities to process parenting experiences in session and incorporate strategies from manualized parent training programs 2. Andrea Colon frequently experiences feelings of overwhelm that are difficult to cope with Objective Andrea Colon would like to develop coping  strategies and reduce the frequency of meltdowns  Target Date: 2024-03-26 Frequency: Weekly  Progress: 0 Modality: individual  Related Interventions Andrea Colon will be provided opportunities to process experiences in sessions Therapist will provide referrals for additional resources as appropriate  Therapist will help Andrea Colon to identify and disengage from maladaptive thoughts and behaviors using CBT-based strategies Therapist will provide emotion regulation strategies including mindfulness, relaxation, and self care techniques Diagnosis Axis none 300.02 (Generalized anxiety disorder) - Open - [Signifier: n/a]    Axis none 314.01 (Attention deficit disorder with hyperactivity) - Open - [Signifier: n/a]    Conditions For Discharge Achievement of treatment goals and objectives    Andrea [name], PhD               Andrea [name], PhD

## 2024-01-22 ENCOUNTER — Ambulatory Visit: Payer: 59 | Admitting: Clinical

## 2024-01-22 ENCOUNTER — Other Ambulatory Visit: Payer: Self-pay | Admitting: Family Medicine

## 2024-01-22 DIAGNOSIS — F331 Major depressive disorder, recurrent, moderate: Secondary | ICD-10-CM | POA: Diagnosis not present

## 2024-01-22 DIAGNOSIS — F902 Attention-deficit hyperactivity disorder, combined type: Secondary | ICD-10-CM

## 2024-01-22 DIAGNOSIS — F411 Generalized anxiety disorder: Secondary | ICD-10-CM | POA: Diagnosis not present
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
## 2024-01-23 MED ORDER — ZALEPLON 5 MG PO CAPS: 5.0000 mg | ORAL_CAPSULE | Freq: Every evening | ORAL | 0 refills | Status: DC | PRN

## 2024-01-23 MED ORDER — CLONAZEPAM 0.5 MG PO TABS: 0.5000 mg | ORAL_TABLET | Freq: Every day | ORAL | 0 refills | Status: DC | PRN

## 2024-01-23 NOTE — Telephone Encounter (Signed)
Last office visit 01/08/2024 for SI joint pain.  Last refilled clonazepam 12/15/2023 for #35 with no refills.  Sonata 12/15/2023 for #30 with no refills.  Next Appt: No future appointments with PCP.

## 2024-01-29 ENCOUNTER — Ambulatory Visit: Payer: 59 | Admitting: Clinical

## 2024-01-29 DIAGNOSIS — F411 Generalized anxiety disorder: Secondary | ICD-10-CM | POA: Diagnosis not present

## 2024-01-29 DIAGNOSIS — F902 Attention-deficit hyperactivity disorder, combined type: Secondary | ICD-10-CM

## 2024-01-29 DIAGNOSIS — F331 Major depressive disorder, recurrent, moderate: Secondary | ICD-10-CM | POA: Diagnosis not present

## 2024-01-29 NOTE — Progress Notes (Signed)
 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 She and the therapist were in their homes at time of visit. Kayleanna is aware of risks of telehealth and consented to a virtual visit. She reported no self-harm in the past fweek and has maintained regular participation in DBT Therapy. Session focused on her tendency to avoid proactive action in favor of reacting to crises. Therapist pointed out the costs of this tendency. She is scheduled to be seen again in one week.   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 She expresses herself well in writing.  Client Treatment Preferences  Venecia prefers in-person sessions, during school hours  Client Statement of Needs  Armie shared that she is seeking parenting skills, as well as healthier coping skills.  Treatment Level  Weekly  Symptoms  Anxiety : restlessness, difficulty relaxing, racing thoughts, difficulty concentrating, difficulty with creating and adhering to plans and routines (Status: maintained). Depression: tearfulness (Status: maintained).  Problems Addressed  New Description, New Description  Goals 1. Channa would like to process past experiences in order to better understand how they impact her at present Objective Maralee will increase ability to connect to and understand emotional experiences, especially those related to past traumas  Target Date: 2024-03-26 Frequency: Weekly  Progress: 0 Modality: individual  Related Interventions Therapist will help Modest to consider organizational strategies in her day-to-day life, incorporating manualized treatments such as [name] Therapist will provide opportunities to process parenting experiences in session and incorporate strategies from manualized parent training programs 2. Emaley frequently  experiences feelings of overwhelm that are difficult to cope with Objective Tanisia would like to develop coping strategies and reduce the frequency of meltdowns  Target Date: 2024-03-26 Frequency: Weekly  Progress: 0 Modality: individual  Related Interventions Dorissa will be provided opportunities to process experiences in sessions Therapist will provide referrals for additional resources as appropriate  Therapist will help Inge to identify and disengage from maladaptive thoughts and behaviors using CBT-based strategies Therapist will provide emotion regulation strategies including mindfulness, relaxation, and self care techniques Diagnosis Axis none 300.02 (Generalized anxiety disorder) - Open - [Signifier: n/a]    Axis none 314.01 (Attention deficit disorder with hyperactivity) - Open - [Signifier: n/a]    Conditions For Discharge Achievement of treatment goals and objectives       Andriette LITTIE Ponto, PhD               Andriette LITTIE Ponto, PhD               Andriette LITTIE Ponto, PhD

## 2024-02-03 ENCOUNTER — Ambulatory Visit: Payer: 59 | Admitting: Gastroenterology

## 2024-02-04 ENCOUNTER — Ambulatory Visit: Payer: 59 | Admitting: Clinical

## 2024-02-04 DIAGNOSIS — F902 Attention-deficit hyperactivity disorder, combined type: Secondary | ICD-10-CM | POA: Diagnosis not present

## 2024-02-04 DIAGNOSIS — F411 Generalized anxiety disorder: Secondary | ICD-10-CM | POA: Diagnosis not present

## 2024-02-04 DIAGNOSIS — F331 Major depressive disorder, recurrent, moderate: Secondary | ICD-10-CM | POA: Diagnosis not present

## 2024-02-04 NOTE — Progress Notes (Signed)
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 She reported that she had not engaged in self-harm over the past week. She reflected upon her ambivalence toward taking concrete steps toward goals in various areas of her life. Therapist engaged her in discussion, encouraging her to explore this pattern. Toward the end of session, she shared details of an argument she had had with her son, and therapist suggested discussing this further during her next appointment. Suicidal ideation and intent were denied.  She is scheduled to be seen again in one week.  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  Client Treatment Preferences  Nel prefers in-person sessions, during school hours  Client Statement of Needs  Charvi shared that she is seeking parenting skills, as well as healthier coping skills.  Treatment Level  Weekly  Symptoms  Anxiety : restlessness, difficulty relaxing, racing thoughts, difficulty concentrating, difficulty with creating and adhering to plans and routines (Status: maintained). Depression: tearfulness (Status: maintained).  Problems Addressed  New Description, New Description  Goals 1. Tahlor would like to process past experiences in order to better understand how they impact her at present Objective Heyli will increase ability to connect to and understand emotional experiences, especially those related to past traumas  Target Date: 2024-03-26 Frequency: Weekly  Progress: 0 Modality: individual  Related Interventions Therapist will help Elianna to consider organizational strategies in her day-to-day life, incorporating manualized treatments such as [name] Therapist will provide opportunities to process parenting experiences in session and incorporate strategies from  manualized parent training programs 2. Daylynn frequently experiences feelings of overwhelm that are difficult to cope with Objective Markea would like to develop coping strategies and reduce the frequency of meltdowns  Target Date: 2024-03-26 Frequency: Weekly  Progress: 0 Modality: individual  Related Interventions Ralyn will be provided opportunities to process experiences in sessions Therapist will provide referrals for additional resources as appropriate  Therapist will help Gaia to identify and disengage from maladaptive thoughts and behaviors using CBT-based strategies Therapist will provide emotion regulation strategies including mindfulness, relaxation, and self care techniques Diagnosis Axis none 300.02 (Generalized anxiety disorder) - Open - [Signifier: n/a]    Axis none 314.01 (Attention deficit disorder with hyperactivity) - Open - [Signifier: n/a]    Conditions For Discharge Achievement of treatment goals and objectives        [name], PhD               [name], PhD

## 2024-02-10 ENCOUNTER — Ambulatory Visit: Payer: 59 | Admitting: Clinical

## 2024-02-10 DIAGNOSIS — F411 Generalized anxiety disorder: Secondary | ICD-10-CM | POA: Diagnosis not present

## 2024-02-10 DIAGNOSIS — F331 Major depressive disorder, recurrent, moderate: Secondary | ICD-10-CM | POA: Diagnosis not present

## 2024-02-10 DIAGNOSIS — F902 Attention-deficit hyperactivity disorder, combined type: Secondary | ICD-10-CM | POA: Diagnosis not present

## 2024-02-10 NOTE — Progress Notes (Signed)
 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 She reported that she had not engaged in self-harm over the past week. She shared homework that she had completed for her DBT therapist and queried several terms that had come up. The remainder of session focused on an argument she had had with her son the previous week during which she had threatened to drive off a bridge. Suicidal ideation and intent were denied and Andrea Colon assured the therapist that she would be safe. Therapist pointed out difficulty holding and setting boundaries and worked with her to consider how to improve behavioral challenges that had triggered the argument. She created a plan to speak with her son's ABA therapist and ask for help with school refusal. She is scheduled to be seen again on Thursday.  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  Client Treatment Preferences  Andrea Colon prefers in-person sessions, during school hours  Client Statement of Needs  Andrea Colon shared that she is seeking parenting skills, as well as healthier coping skills.  Treatment Level  Weekly  Symptoms  Anxiety : restlessness, difficulty relaxing, racing thoughts, difficulty concentrating, difficulty with creating and adhering to plans and routines (Status: maintained). Depression: tearfulness (Status: maintained).  Problems Addressed  New Description, New Description  Goals 1. Andrea Colon would like to process past experiences in order to better understand how they impact her at present Objective Andrea Colon will increase ability to connect to and understand emotional experiences, especially those related to past traumas  Target Date: 2024-03-26 Frequency: Weekly  Progress: 0 Modality: individual  Related Interventions Therapist will help Andrea Colon to consider  organizational strategies in her day-to-day life, incorporating manualized treatments such as [name] Therapist will provide opportunities to process parenting experiences in session and incorporate strategies from manualized parent training programs 2. Andrea Colon frequently experiences feelings of overwhelm that are difficult to cope with Objective Andrea Colon would like to develop coping strategies and reduce the frequency of meltdowns  Target Date: 2024-03-26 Frequency: Weekly  Progress: 0 Modality: individual  Related Interventions Andrea Colon will be provided opportunities to process experiences in sessions Therapist will provide referrals for additional resources as appropriate  Therapist will help Andrea Colon to identify and disengage from maladaptive thoughts and behaviors using CBT-based strategies Therapist will provide emotion regulation strategies including mindfulness, relaxation, and self care techniques Diagnosis Axis none 300.02 (Generalized anxiety disorder) - Open - [Signifier: n/a]    Axis none 314.01 (Attention deficit disorder with hyperactivity) - Open - [Signifier: n/a]    Conditions For Discharge Achievement of treatment goals and objectives    Andrea [name], PhD               Andrea [name], PhD

## 2024-02-12 ENCOUNTER — Ambulatory Visit: Payer: 59 | Admitting: Clinical

## 2024-02-17 ENCOUNTER — Ambulatory Visit: Payer: 59 | Admitting: Clinical

## 2024-02-19 ENCOUNTER — Ambulatory Visit: Payer: 59 | Admitting: Clinical

## 2024-02-19 DIAGNOSIS — F331 Major depressive disorder, recurrent, moderate: Secondary | ICD-10-CM | POA: Diagnosis not present

## 2024-02-19 DIAGNOSIS — F411 Generalized anxiety disorder: Secondary | ICD-10-CM | POA: Diagnosis not present

## 2024-02-19 DIAGNOSIS — F902 Attention-deficit hyperactivity disorder, combined type: Secondary | ICD-10-CM

## 2024-02-19 NOTE — Progress Notes (Signed)
 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 She reported that she had not engaged in self-harm over the past week. She shared concern that she has not had opportunities to discuss skills learned in DBT group in her individual DBT sessions. Therapist engaged her in discussion of communication strategies she can use to bring this up with her DBT therapist. She also reflected upon dynamics in her relationship with her weight management doctor, and therapist encouraged her to consider her own hopes for the relationship and whether these are actually possible. She is scheduled to be seen again on Tuesday.  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  Client Treatment Preferences  Clemence prefers in-person sessions, during school hours  Client Statement of Needs  Ezra shared that she is seeking parenting skills, as well as healthier coping skills.  Treatment Level  Weekly  Symptoms  Anxiety : restlessness, difficulty relaxing, racing thoughts, difficulty concentrating, difficulty with creating and adhering to plans and routines (Status: maintained). Depression: tearfulness (Status: maintained).  Problems Addressed  New Description, New Description  Goals 1. Quaneisha would like to process past experiences in order to better understand how they impact her at present Objective Makeda will increase ability to connect to and understand emotional experiences, especially those related to past traumas  Target Date: 2024-03-26 Frequency: Weekly  Progress: 0 Modality: individual  Related Interventions Therapist will help Brilyn to consider organizational strategies in her day-to-day life, incorporating manualized treatments such as [name] Therapist will provide opportunities to process parenting  experiences in session and incorporate strategies from manualized parent training programs 2. Graciana frequently experiences feelings of overwhelm that are difficult to cope with Objective Tierney would like to develop coping strategies and reduce the frequency of meltdowns  Target Date: 2024-03-26 Frequency: Weekly  Progress: 0 Modality: individual  Related Interventions Quintara will be provided opportunities to process experiences in sessions Therapist will provide referrals for additional resources as appropriate  Therapist will help Ivylynn to identify and disengage from maladaptive thoughts and behaviors using CBT-based strategies Therapist will provide emotion regulation strategies including mindfulness, relaxation, and self care techniques Diagnosis Axis none 300.02 (Generalized anxiety disorder) - Open - [Signifier: n/a]    Axis none 314.01 (Attention deficit disorder with hyperactivity) - Open - [Signifier: n/a]    Conditions For Discharge Achievement of treatment goals and objectives      [name], PhD               [name], PhD

## 2024-02-26 ENCOUNTER — Ambulatory Visit: Payer: 59 | Admitting: Clinical

## 2024-02-26 DIAGNOSIS — F331 Major depressive disorder, recurrent, moderate: Secondary | ICD-10-CM

## 2024-02-26 DIAGNOSIS — F411 Generalized anxiety disorder: Secondary | ICD-10-CM | POA: Diagnosis not present

## 2024-02-26 DIAGNOSIS — F902 Attention-deficit hyperactivity disorder, combined type: Secondary | ICD-10-CM | POA: Diagnosis not present

## 2024-02-26 NOTE — Progress Notes (Signed)
 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 Upon questioning from the therapist, she confirmed that she had engaged in self- harm, and showed the therapist several shallow cuts on her ankle. She connected this to concerns in her relationship with Dr. Earlene Plater, whom she described as her favorite person. Session focused on encouraging Andrea Colon to use strategies she is learning in DBT, as well as to hold boundaries with herself (for example, not emailing or texting Dr. Earlene Plater information about traumatic and/or sensitive experiences). She is scheduled to be seen again on Tuesday.  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  Client Treatment Preferences  Andrea Colon prefers in-person sessions, during school hours  Client Statement of Needs  Andrea Colon shared that she is seeking parenting skills, as well as healthier coping skills.  Treatment Level  Weekly  Symptoms  Anxiety : restlessness, difficulty relaxing, racing thoughts, difficulty concentrating, difficulty with creating and adhering to plans and routines (Status: maintained). Depression: tearfulness (Status: maintained).  Problems Addressed  New Description, New Description  Goals 1. Andrea Colon would like to process past experiences in order to better understand how they impact her at present Objective Andrea Colon will increase ability to connect to and understand emotional experiences, especially those related to past traumas  Target Date: 2024-03-26 Frequency: Weekly  Progress: 0 Modality: individual  Related Interventions Therapist will help Gatha to consider organizational strategies in her day-to-day life, incorporating manualized treatments such as [name] Therapist will provide opportunities to process parenting experiences in session and  incorporate strategies from manualized parent training programs 2. Andrea Colon frequently experiences feelings of overwhelm that are difficult to cope with Objective Andrea Colon would like to develop coping strategies and reduce the frequency of meltdowns  Target Date: 2024-03-26 Frequency: Weekly  Progress: 0 Modality: individual  Related Interventions Andrea Colon will be provided opportunities to process experiences in sessions Therapist will provide referrals for additional resources as appropriate  Therapist will help Andrea Colon to identify and disengage from maladaptive thoughts and behaviors using CBT-based strategies Therapist will provide emotion regulation strategies including mindfulness, relaxation, and self care techniques Diagnosis Axis none 300.02 (Generalized anxiety disorder) - Open - [Signifier: n/a]    Axis none 314.01 (Attention deficit disorder with hyperactivity) - Open - [Signifier: n/a]    Conditions For Discharge Achievement of treatment goals and objectives          [name], PhD               [name], PhD

## 2024-02-27 ENCOUNTER — Other Ambulatory Visit: Payer: Self-pay | Admitting: Family Medicine

## 2024-02-27 MED ORDER — ZALEPLON 5 MG PO CAPS: 5.0000 mg | ORAL_CAPSULE | Freq: Every evening | ORAL | 0 refills | Status: DC | PRN

## 2024-02-27 MED ORDER — CLONAZEPAM 0.5 MG PO TABS: 0.5000 mg | ORAL_TABLET | Freq: Every day | ORAL | 0 refills | Status: DC | PRN

## 2024-02-27 NOTE — Telephone Encounter (Signed)
 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  Last refilled clonazepam 01/23/2024 for #35 with no refills.  Zaleplon 01/23/24 for #30 with no refills.  Next Appt:  No future appointments.

## 2024-03-02 ENCOUNTER — Ambulatory Visit: Payer: 59 | Admitting: Clinical

## 2024-03-02 DIAGNOSIS — F902 Attention-deficit hyperactivity disorder, combined type: Secondary | ICD-10-CM

## 2024-03-02 DIAGNOSIS — F331 Major depressive disorder, recurrent, moderate: Secondary | ICD-10-CM

## 2024-03-02 DIAGNOSIS — F411 Generalized anxiety disorder: Secondary | ICD-10-CM | POA: Diagnosis not present

## 2024-03-02 NOTE — Progress Notes (Signed)
 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 Upon questioning from the therapist, she confirmed that she had again engaged in self- harm, reporting shallow cuts to her upper thigh. She shared a series of reflections she had had on borderline personality disorder, and that she increasingly believes she has it. Session focused on issues that had arisen in her relationship with her DBT therapist leading up to the self-harm. Andrea Colon shared a series of text messages that she had sent, and therapist pointed out how she had challenged boundaries her DBT therapist had attempted to set. She reported that she had canceled their next appointment, and therapist strongly encouraged her to reschedule it in order to process the exchange. She is scheduled to be seen again in one week.  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  Client Treatment Preferences  Andrea Colon prefers in-person sessions, during school hours  Client Statement of Needs  Andrea Colon shared that she is seeking parenting skills, as well as healthier coping skills.  Treatment Level  Weekly  Symptoms  Anxiety : restlessness, difficulty relaxing, racing thoughts, difficulty concentrating, difficulty with creating and adhering to plans and routines (Status: maintained). Depression: tearfulness (Status: maintained).  Problems Addressed  New Description, New Description  Goals 1. Andrea Colon would like to process past experiences in order to better understand how they impact her at present Objective Andrea Colon will increase ability to connect to and understand emotional experiences, especially those related to past traumas  Target Date: 2024-03-26 Frequency: Weekly  Progress: 0 Modality: individual  Related Interventions Therapist will help Katerra to consider  organizational strategies in her day-to-day life, incorporating manualized treatments such as [name] Therapist will provide opportunities to process parenting experiences in session and incorporate strategies from manualized parent training programs 2. Andrea Colon frequently experiences feelings of overwhelm that are difficult to cope with Objective Andrea Colon would like to develop coping strategies and reduce the frequency of meltdowns  Target Date: 2024-03-26 Frequency: Weekly  Progress: 0 Modality: individual  Related Interventions Andrea Colon will be provided opportunities to process experiences in sessions Therapist will provide referrals for additional resources as appropriate  Therapist will help Andrea Colon to identify and disengage from maladaptive thoughts and behaviors using CBT-based strategies Therapist will provide emotion regulation strategies including mindfulness, relaxation, and self care techniques Diagnosis Axis none 300.02 (Generalized anxiety disorder) - Open - [Signifier: n/a]    Axis none 314.01 (Attention deficit disorder with hyperactivity) - Open - [Signifier: n/a]    Conditions For Discharge Achievement of treatment goals and objectives            [name], PhD               [name], PhD

## 2024-03-11 ENCOUNTER — Ambulatory Visit: Payer: 59 | Admitting: Clinical

## 2024-03-11 DIAGNOSIS — F902 Attention-deficit hyperactivity disorder, combined type: Secondary | ICD-10-CM

## 2024-03-11 DIAGNOSIS — F411 Generalized anxiety disorder: Secondary | ICD-10-CM | POA: Diagnosis not present

## 2024-03-11 DIAGNOSIS — F331 Major depressive disorder, recurrent, moderate: Secondary | ICD-10-CM

## 2024-03-11 NOTE — Progress Notes (Signed)
 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 She had not engaged in self harm in the past week, and had reinstated her appointment with her DBT therapist to discuss their interaction via text. Therapist engaged her in discussion of her next best step, offering psychoeducation on DBT. She endorsed passive suicidal ideation without a plan (e.g., "I wish this would be over"), and reported that she would never act on these thoughts out of concern for her son.  She is scheduled to be seen again in one week.  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  Client Treatment Preferences  Andrea Colon prefers in-person sessions, during school hours  Client Statement of Needs  Andrea Colon shared that she is seeking parenting skills, as well as healthier coping skills.  Treatment Level  Weekly  Symptoms  Anxiety : restlessness, difficulty relaxing, racing thoughts, difficulty concentrating, difficulty with creating and adhering to plans and routines (Status: maintained). Depression: tearfulness (Status: maintained).  Problems Addressed  New Description, New Description  Goals 1. Andrea Colon would like to process past experiences in order to better understand how they impact her at present Objective Andrea Colon will increase ability to connect to and understand emotional experiences, especially those related to past traumas  Target Date: 2024-03-26 Frequency: Weekly  Progress: 0 Modality: individual  Related Interventions Therapist will help [name] to consider organizational strategies in her day-to-day life, incorporating manualized treatments such as [name] Therapist will provide opportunities to process parenting experiences in session and incorporate strategies from manualized parent training programs 2. Andrea Colon  frequently experiences feelings of overwhelm that are difficult to cope with Objective Andrea Colon would like to develop coping strategies and reduce the frequency of meltdowns  Target Date: 2024-03-26 Frequency: Weekly  Progress: 0 Modality: individual  Related Interventions Andrea Colon will be provided opportunities to process experiences in sessions Therapist will provide referrals for additional resources as appropriate  Therapist will help Andrea Colon to identify and disengage from maladaptive thoughts and behaviors using CBT-based strategies Therapist will provide emotion regulation strategies including mindfulness, relaxation, and self care techniques Diagnosis Axis none 300.02 (Generalized anxiety disorder) - Open - [Signifier: n/a]    Axis none 314.01 (Attention deficit disorder with hyperactivity) - Open - [Signifier: n/a]    Conditions For Discharge Achievement of treatment goals and objectives           [name], PhD               [name], PhD

## 2024-03-18 ENCOUNTER — Ambulatory Visit: Payer: 59 | Admitting: Clinical

## 2024-03-18 DIAGNOSIS — F902 Attention-deficit hyperactivity disorder, combined type: Secondary | ICD-10-CM

## 2024-03-18 DIAGNOSIS — F331 Major depressive disorder, recurrent, moderate: Secondary | ICD-10-CM

## 2024-03-18 DIAGNOSIS — F411 Generalized anxiety disorder: Secondary | ICD-10-CM | POA: Diagnosis not present

## 2024-03-18 NOTE — Progress Notes (Signed)
 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 She had not engaged in self harm in the past week, and had maintained appointments with her DBT program. Session focused on a conflict that had arisen with her husband. Therapist pointed out the function challenges in the relationship may have served, including providing opportunities to avoid taking action. She is scheduled to be seen again in one week.  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  Client Treatment Preferences  Andrea Colon prefers in-person sessions, during school hours  Client Statement of Needs  Andrea Colon shared that she is seeking parenting skills, as well as healthier coping skills.  Treatment Level  Weekly  Symptoms  Anxiety : restlessness, difficulty relaxing, racing thoughts, difficulty concentrating, difficulty with creating and adhering to plans and routines (Status: maintained). Depression: tearfulness (Status: maintained).  Problems Addressed  New Description, New Description  Goals 1. Andrea Colon would like to process past experiences in order to better understand how they impact her at present Objective Andrea Colon will increase ability to connect to and understand emotional experiences, especially those related to past traumas  Target Date: 2024-03-26 Frequency: Weekly  Progress: 0 Modality: individual  Related Interventions Therapist will help Andrea Colon to consider organizational strategies in her day-to-day life, incorporating manualized treatments such as [name] Therapist will provide opportunities to process parenting experiences in session and incorporate strategies from manualized parent training programs 2. Andrea Colon frequently experiences feelings of overwhelm that are difficult to cope with Objective Andrea Colon would like  to develop coping strategies and reduce the frequency of meltdowns  Target Date: 2024-03-26 Frequency: Weekly  Progress: 0 Modality: individual  Related Interventions Andrea Colon will be provided opportunities to process experiences in sessions Therapist will provide referrals for additional resources as appropriate  Therapist will help Andrea Colon to identify and disengage from maladaptive thoughts and behaviors using CBT-based strategies Therapist will provide emotion regulation strategies including mindfulness, relaxation, and self care techniques Diagnosis Axis none 300.02 (Generalized anxiety disorder) - Open - [Signifier: n/a]    Axis none 314.01 (Attention deficit disorder with hyperactivity) - Open - [Signifier: n/a]    Conditions For Discharge Achievement of treatment goals and objectives    [name], PhD               [name], PhD

## 2024-03-25 ENCOUNTER — Ambulatory Visit: Payer: 59 | Admitting: Clinical

## 2024-03-25 DIAGNOSIS — F331 Major depressive disorder, recurrent, moderate: Secondary | ICD-10-CM

## 2024-03-25 DIAGNOSIS — F411 Generalized anxiety disorder: Secondary | ICD-10-CM | POA: Diagnosis not present

## 2024-03-25 DIAGNOSIS — F902 Attention-deficit hyperactivity disorder, combined type: Secondary | ICD-10-CM | POA: Diagnosis not present

## 2024-03-25 NOTE — Progress Notes (Signed)
 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 She was in her home and therapist was in her office. Client is aware of risks of telehealth and consented to a virtual visit. She had engaged in self harm in the past week. Suicidal ideation and intent were denied, and she had had a conversation with her DBT therapist about transferring to a different therapist. Session focused on processing dynamics in her relationship with one of her providers. Therapist suggested using her ouchie fidget the next time she has the urge to self harm, and she agreed to try. She is scheduled to be seen again in one week.  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  Client Treatment Preferences  Andrea Colon prefers in-person sessions, during school hours  Client Statement of Needs  Andrea Colon shared that she is seeking parenting skills, as well as healthier coping skills.  Treatment Level  Weekly  Symptoms  Anxiety : restlessness, difficulty relaxing, racing thoughts, difficulty concentrating, difficulty with creating and adhering to plans and routines (Status: maintained). Depression: tearfulness (Status: maintained).  Problems Addressed  New Description, New Description  Goals 1. Andrea Colon would like to process past experiences in order to better understand how they impact her at present Objective Andrea Colon will increase ability to connect to and understand emotional experiences, especially those related to past traumas  Target Date: 2024-03-26 Frequency: Weekly  Progress: 0 Modality: individual  Related Interventions Therapist will help Andrea Colon to consider organizational strategies in her day-to-day life, incorporating manualized treatments such as [name] Therapist will provide  opportunities to process parenting experiences in session and incorporate strategies from manualized parent training programs 2. Andrea Colon frequently experiences feelings of overwhelm that are difficult to cope with Objective Andrea Colon would like to develop coping strategies and reduce the frequency of meltdowns  Target Date: 2024-03-26 Frequency: Weekly  Progress: 0 Modality: individual  Related Interventions Andrea Colon will be provided opportunities to process experiences in sessions Therapist will provide referrals for additional resources as appropriate  Therapist will help Andrea Colon to identify and disengage from maladaptive thoughts and behaviors using CBT-based strategies Therapist will provide emotion regulation strategies including mindfulness, relaxation, and self care techniques Diagnosis Axis none 300.02 (Generalized anxiety disorder) - Open - [Signifier: n/a]    Axis none 314.01 (Attention deficit disorder with hyperactivity) - Open - [Signifier: n/a]    Conditions For Discharge Achievement of treatment goals and objectives    [name], PhD               [name], PhD

## 2024-03-30 ENCOUNTER — Other Ambulatory Visit: Payer: Self-pay | Admitting: Family Medicine

## 2024-03-30 MED ORDER — CLONAZEPAM 0.5 MG PO TABS
0.5000 mg | ORAL_TABLET | Freq: Every day | ORAL | 0 refills | Status: DC | PRN
Start: 2024-03-30 — End: 2024-05-01

## 2024-03-30 MED ORDER — ZALEPLON 5 MG PO CAPS: 5.0000 mg | ORAL_CAPSULE | Freq: Every evening | ORAL | 0 refills | Status: DC | PRN

## 2024-03-31 ENCOUNTER — Ambulatory Visit: Payer: 59 | Admitting: Clinical

## 2024-03-31 DIAGNOSIS — F331 Major depressive disorder, recurrent, moderate: Secondary | ICD-10-CM

## 2024-03-31 DIAGNOSIS — F902 Attention-deficit hyperactivity disorder, combined type: Secondary | ICD-10-CM | POA: Diagnosis not present

## 2024-03-31 DIAGNOSIS — F411 Generalized anxiety disorder: Secondary | ICD-10-CM | POA: Diagnosis not present

## 2024-03-31 NOTE — Progress Notes (Addendum)
 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 She had not engaged in self-harm in the past week. Session focused on developments in her relationships, as well as a feeling that something is "wrong" with her. She had completed her homework of taking a yoga class, and continues to attend DBT therapy. She is scheduled to be seen again in one week.  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  Client Treatment Preferences  Andrea Colon prefers in-person sessions, during school hours  Client Statement of Needs  Andrea Colon shared that she is seeking parenting skills, as well as healthier coping skills.  Treatment Level  Weekly  Symptoms  Anxiety : restlessness, difficulty relaxing, racing thoughts, difficulty concentrating, difficulty with creating and adhering to plans and routines (Status: maintained). Depression: tearfulness (Status: maintained).  Problems Addressed  New Description, New Description  Goals 1. Andrea Colon would like to process past experiences in order to better understand how they impact her at present Objective Andrea Colon will increase ability to connect to and understand emotional experiences, especially those related to past traumas  Target Date: 2025-03-26 Frequency: Weekly  Progress: 0 Modality: individual  Related Interventions Therapist will help [name] to consider organizational strategies in her day-to-day life, incorporating manualized treatments such as [name] Therapist will provide opportunities to process parenting experiences in session and incorporate strategies from manualized parent training programs 2. Andrea Colon frequently experiences feelings of overwhelm that are difficult to cope with Objective Andrea Colon would like to develop coping strategies and reduce the frequency  of meltdowns  Target Date: 2025-03-26 Frequency: Weekly  Progress: 0 Modality: individual  Related Interventions Andrea Colon will be provided opportunities to process experiences in sessions Therapist will provide referrals for additional resources as appropriate  Therapist will help Andrea Colon to identify and disengage from maladaptive thoughts and behaviors using CBT-based strategies Therapist will provide emotion regulation strategies including mindfulness, relaxation, and self care techniques Diagnosis Axis none 300.02 (Generalized anxiety disorder) - Open - [Signifier: n/a]    Axis none 314.01 (Attention deficit disorder with hyperactivity) - Open - [Signifier: n/a]    Conditions For Discharge Achievement of treatment goals and objectives    [name], PhD               [name], PhD

## 2024-04-08 ENCOUNTER — Ambulatory Visit (INDEPENDENT_AMBULATORY_CARE_PROVIDER_SITE_OTHER): Payer: 59 | Admitting: Clinical

## 2024-04-08 DIAGNOSIS — F411 Generalized anxiety disorder: Secondary | ICD-10-CM | POA: Diagnosis not present

## 2024-04-08 DIAGNOSIS — F902 Attention-deficit hyperactivity disorder, combined type: Secondary | ICD-10-CM

## 2024-04-08 DIAGNOSIS — F331 Major depressive disorder, recurrent, moderate: Secondary | ICD-10-CM

## 2024-04-08 NOTE — Progress Notes (Signed)
 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 She had not engaged in self-harm in the past week. Session included processing the conversation from the previous session and its impact on her. She had also changed DBT therapists and expressed optimism about proceeding with her new one. Therapist introduced a mindfulness app to help Dianah incorporate a mindfulness practice. She is scheduled to be seen again in one week.  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  Client Treatment Preferences  Karlee prefers in-person sessions, during school hours  Client Statement of Needs  Aasha shared that she is seeking parenting skills, as well as healthier coping skills.  Treatment Level  Weekly  Symptoms  Anxiety : restlessness, difficulty relaxing, racing thoughts, difficulty concentrating, difficulty with creating and adhering to plans and routines (Status: maintained). Depression: tearfulness (Status: maintained).  Problems Addressed  New Description, New Description  Goals 1. Xiadani would like to process past experiences in order to better understand how they impact her at present Objective Chenise will increase ability to connect to and understand emotional experiences, especially those related to past traumas  Target Date: 2025-03-26 Frequency: Weekly  Progress: 0 Modality: individual  Related Interventions Therapist will help Doretta to consider organizational strategies in her day-to-day life, incorporating manualized treatments such as [name] Therapist will provide opportunities to process parenting experiences in session and incorporate strategies from manualized parent training programs 2. Makira frequently experiences feelings of overwhelm that are difficult to cope  with Objective Eusebia would like to develop coping strategies and reduce the frequency of meltdowns  Target Date: 2025-03-26 Frequency: Weekly  Progress: 0 Modality: individual  Related Interventions Conny will be provided opportunities to process experiences in sessions Therapist will provide referrals for additional resources as appropriate  Therapist will help Khai to identify and disengage from maladaptive thoughts and behaviors using CBT-based strategies Therapist will provide emotion regulation strategies including mindfulness, relaxation, and self care techniques Diagnosis Axis none 300.02 (Generalized anxiety disorder) - Open - [Signifier: n/a]    Axis none 314.01 (Attention deficit disorder with hyperactivity) - Open - [Signifier: n/a]    Conditions For Discharge Achievement of treatment goals and objectives   [name], PhD               [name], PhD

## 2024-04-15 ENCOUNTER — Ambulatory Visit: Payer: 59 | Admitting: Clinical

## 2024-04-15 DIAGNOSIS — F331 Major depressive disorder, recurrent, moderate: Secondary | ICD-10-CM | POA: Diagnosis not present

## 2024-04-15 DIAGNOSIS — F902 Attention-deficit hyperactivity disorder, combined type: Secondary | ICD-10-CM

## 2024-04-15 DIAGNOSIS — F411 Generalized anxiety disorder: Secondary | ICD-10-CM

## 2024-04-15 NOTE — Progress Notes (Signed)
 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 She was in her home and therapist was in her office. Client is aware of risks of telehealth and consented to a virtual visit. Priya had attending a hospital-based GI cleanout for her son, after cancelling two prior appointments. Session focused on processing what the experience had brought up for her. She is scheduled to be seen again in one week.  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  Client Treatment Preferences  Kalliopi prefers in-person sessions, during school hours  Client Statement of Needs  Joelene shared that she is seeking parenting skills, as well as healthier coping skills.  Treatment Level  Weekly  Symptoms  Anxiety : restlessness, difficulty relaxing, racing thoughts, difficulty concentrating, difficulty with creating and adhering to plans and routines (Status: maintained). Depression: tearfulness (Status: maintained).  Problems Addressed  New Description, New Description  Goals 1. Ertha would like to process past experiences in order to better understand how they impact her at present Objective Shalan will increase ability to connect to and understand emotional experiences, especially those related to past traumas  Target Date: 2025-03-26 Frequency: Weekly  Progress: 0 Modality: individual  Related Interventions Therapist will help Lyza to consider organizational strategies in her day-to-day life, incorporating manualized treatments such as [name] Therapist will provide opportunities to process parenting experiences in session and incorporate strategies from manualized parent training programs 2. Tatem frequently experiences feelings of overwhelm that are difficult to cope  with Objective Leontyne would like to develop coping strategies and reduce the frequency of meltdowns  Target Date: 2025-03-26 Frequency: Weekly  Progress: 0 Modality: individual  Related Interventions Caia will be provided opportunities to process experiences in sessions Therapist will provide referrals for additional resources as appropriate  Therapist will help Miryah to identify and disengage from maladaptive thoughts and behaviors using CBT-based strategies Therapist will provide emotion regulation strategies including mindfulness, relaxation, and self care techniques Diagnosis Axis none 300.02 (Generalized anxiety disorder) - Open - [Signifier: n/a]    Axis none 314.01 (Attention deficit disorder with hyperactivity) - Open - [Signifier: n/a]    Conditions For Discharge Achievement of treatment goals and objectives    [name], PhD               [name], PhD

## 2024-04-22 ENCOUNTER — Ambulatory Visit: Payer: 59 | Admitting: Clinical

## 2024-04-22 ENCOUNTER — Ambulatory Visit: Admitting: Family Medicine

## 2024-04-22 ENCOUNTER — Encounter: Payer: Self-pay | Admitting: Family Medicine

## 2024-04-22 VITALS — BP 152/92 | HR 102 | Temp 99.1°F | Ht 64.0 in | Wt 247.5 lb

## 2024-04-22 DIAGNOSIS — N926 Irregular menstruation, unspecified: Secondary | ICD-10-CM | POA: Diagnosis not present

## 2024-04-22 DIAGNOSIS — F902 Attention-deficit hyperactivity disorder, combined type: Secondary | ICD-10-CM | POA: Diagnosis not present

## 2024-04-22 DIAGNOSIS — F331 Major depressive disorder, recurrent, moderate: Secondary | ICD-10-CM

## 2024-04-22 DIAGNOSIS — F411 Generalized anxiety disorder: Secondary | ICD-10-CM | POA: Diagnosis not present

## 2024-04-22 DIAGNOSIS — R519 Headache, unspecified: Secondary | ICD-10-CM

## 2024-04-22 DIAGNOSIS — I10 Essential (primary) hypertension: Secondary | ICD-10-CM | POA: Diagnosis not present

## 2024-04-22 DIAGNOSIS — R5383 Other fatigue: Secondary | ICD-10-CM

## 2024-04-22 DIAGNOSIS — H539 Unspecified visual disturbance: Secondary | ICD-10-CM | POA: Diagnosis not present

## 2024-04-22 LAB — COMPREHENSIVE METABOLIC PANEL WITH GFR
ALT: 11 U/L (ref 0–35)
AST: 13 U/L (ref 0–37)
Albumin: 4.4 g/dL (ref 3.5–5.2)
Alkaline Phosphatase: 43 U/L (ref 39–117)
BUN: 11 mg/dL (ref 6–23)
CO2: 27 meq/L (ref 19–32)
Calcium: 9.5 mg/dL (ref 8.4–10.5)
Chloride: 100 meq/L (ref 96–112)
Creatinine, Ser: 0.72 mg/dL (ref 0.40–1.20)
GFR: 98.78 mL/min (ref >60.00)
Glucose, Bld: 91 mg/dL (ref 70–99)
Potassium: 4 meq/L (ref 3.5–5.1)
Sodium: 134 meq/L — ABNORMAL LOW (ref 135–145)
Total Bilirubin: 0.4 mg/dL (ref 0.2–1.2)
Total Protein: 7.2 g/dL (ref 6.0–8.3)

## 2024-04-22 LAB — CBC WITH DIFFERENTIAL/PLATELET
Basophils Absolute: 0.1 10*3/uL (ref 0.0–0.1)
Basophils Relative: 0.5 % (ref 0.0–3.0)
Eosinophils Absolute: 0.1 10*3/uL (ref 0.0–0.7)
Eosinophils Relative: 0.6 % (ref 0.0–5.0)
HCT: 39.7 % (ref 36.0–46.0)
Hemoglobin: 13.7 g/dL (ref 12.0–15.0)
Lymphocytes Relative: 17.4 % (ref 12.0–46.0)
Lymphs Abs: 1.7 10*3/uL (ref 0.7–4.0)
MCHC: 34.5 g/dL (ref 30.0–36.0)
MCV: 84.9 fl (ref 78.0–100.0)
Monocytes Absolute: 0.5 10*3/uL (ref 0.1–1.0)
Monocytes Relative: 5.3 % (ref 3.0–12.0)
Neutro Abs: 7.4 10*3/uL (ref 1.4–7.7)
Neutrophils Relative %: 76.2 % (ref 43.0–77.0)
Platelets: 355 10*3/uL (ref 150.0–400.0)
RBC: 4.67 Mil/uL (ref 3.87–5.11)
RDW: 13.7 % (ref 11.5–15.5)
WBC: 9.8 10*3/uL (ref 4.0–10.5)

## 2024-04-22 LAB — VITAMIN B12: Vitamin B-12: 551 pg/mL (ref 211–911)

## 2024-04-22 LAB — FOLATE: Folate: 11.2 ng/mL (ref >5.9)

## 2024-04-22 LAB — FOLLICLE STIMULATING HORMONE: FSH: 3 m[IU]/mL

## 2024-04-22 LAB — MAGNESIUM: Magnesium: 1.7 mg/dL (ref 1.5–2.5)

## 2024-04-22 LAB — LUTEINIZING HORMONE: LH: 4.12 m[IU]/mL

## 2024-04-22 LAB — TSH: TSH: 1.79 u[IU]/mL (ref 0.35–5.50)

## 2024-04-22 NOTE — Progress Notes (Signed)
 Patient ID: Andrea Colon, female    DOB: 1976-08-26, 48 y.o.   MRN: [id number]  This visit was conducted in person.  BP (!) 152/92   Pulse (!) 102   Temp 99.1 F (37.3 C) (Oral)   Ht 5\' 4"  (1.626 m)   Wt 247 lb 8 oz (112.3 kg)   LMP 04/01/2024   SpO2 94%   BMI 42.48 kg/m    CC:  Chief Complaint  Patient presents with   Medical Management of Chronic Issues    Here for HTN f/u. C/o higher than usual BP readings and HA.     Subjective:   HPI: Andrea Colon is a 48 y.o. female presenting on 04/22/2024 for Medical Management of Chronic Issues (Here for HTN f/u. C/o higher than usual BP readings and HA. )     > 6 Months on fatigue and cold hands and feet.    Low ferritin 13 in 11/2023.  Supplemented with OTC iron 65 mg iron caboxyl and heme iron. Fatigue improved. Cold continued.   Stated noting headaches  and  had spontaneous nose bleed,  episode of " swirly vision, decreased vision spot' .Aaron Aas Lasted minutes. BP  156/88.   Stopped the iron. Went back to feeling normal but  did not recheck BP.  When started feeling tired again... restarted iron.  Hypertension:    Restarted iron carboxyl in the 2 days. She has noted increase in BP at home in last few days 156/88  On spironolactone.. has not taken yet today. Using medication without problems or lightheadedness:  Chest pain with exertion:none Edema:none Short of breath: none Average home BPs: Other issues:  BP Readings from Last 3 Encounters:  04/22/24 (!) 152/92  01/08/24 120/74  11/13/23 120/80     Nml B12  Low vit D   Irregular menses.. possible perimenopause.      On new med: gabapentin.  Pristiq new since OCtober.   13 lbs weight gain since lasr fall. Wt Readings from Last 3 Encounters:  04/22/24 247 lb 8 oz (112.3 kg)  01/08/24 243 lb 6 oz (110.4 kg)  11/13/23 234 lb (106.1 kg)     Relevant past medical, surgical, family and social history reviewed and updated as indicated. Interim medical  history since our last visit reviewed. Allergies and medications reviewed and updated. Outpatient Medications Prior to Visit  Medication Sig Dispense Refill   Cholecalciferol 100 MCG (4000 UT) CAPS Take 1 capsule by mouth daily.     GABAPENTIN PO Take 100 mg by mouth at bedtime. Capsule     Omega-3 Fatty Acids (FISH OIL PO) Take 2,200 mg by mouth daily.     PRISTIQ 50 MG 24 hr tablet Take 50 mg by mouth daily.     tretinoin (RETIN-A) 0.025 % cream as needed.     VYVANSE  50 MG capsule Take 50 mg by mouth every morning.     clonazePAM  (KLONOPIN ) 0.5 MG tablet Take 1-2 tablets (0.5-1 mg total) by mouth daily as needed for anxiety. 35 tablet 0   zaleplon  (SONATA ) 5 MG capsule Take 1 capsule (5 mg total) by mouth at bedtime as needed for sleep. 30 capsule 0   cyclobenzaprine  (FLEXERIL ) 10 MG tablet Take 0.5-1 tablets (5-10 mg total) by mouth at bedtime as needed for muscle spasms. 15 tablet 0   escitalopram  (LEXAPRO ) 5 MG tablet Take 2.5 mg by mouth at bedtime.     LINZESS 145 MCG CAPS capsule Take 145 mcg by mouth  daily.     MAGNESIUM PO Take 1 tablet by mouth in the morning and at bedtime.     predniSONE  (DELTASONE ) 20 MG tablet 3 tabs by mouth daily x 3 days, then 2 tabs by mouth daily x 2 days then 1 tab by mouth daily x 2 days 15 tablet 0   No facility-administered medications prior to visit.     Per HPI unless specifically indicated in ROS section below Review of Systems  Constitutional:  Negative for fatigue and fever.  HENT:  Positive for nosebleeds. Negative for congestion.   Eyes:  Negative for pain.  Respiratory:  Negative for cough and shortness of breath.   Cardiovascular:  Negative for chest pain, palpitations and leg swelling.  Gastrointestinal:  Negative for abdominal pain.  Genitourinary:  Negative for dysuria and vaginal bleeding.  Musculoskeletal:  Negative for back pain.  Neurological:  Positive for headaches. Negative for syncope and light-headedness.   Psychiatric/Behavioral:  Negative for dysphoric mood.    Objective:  BP (!) 152/92   Pulse (!) 102   Temp 99.1 F (37.3 C) (Oral)   Ht 5\' 4"  (1.626 m)   Wt 247 lb 8 oz (112.3 kg)   LMP 04/01/2024   SpO2 94%   BMI 42.48 kg/m   Wt Readings from Last 3 Encounters:  04/22/24 247 lb 8 oz (112.3 kg)  01/08/24 243 lb 6 oz (110.4 kg)  11/13/23 234 lb (106.1 kg)      Physical Exam Constitutional:      General: She is not in acute distress.    Appearance: Normal appearance. She is well-developed. She is obese. She is not ill-appearing or toxic-appearing.  HENT:     Head: Normocephalic.     Right Ear: Hearing, tympanic membrane, ear canal and external ear normal. Tympanic membrane is not erythematous, retracted or bulging.     Left Ear: Hearing, tympanic membrane, ear canal and external ear normal. Tympanic membrane is not erythematous, retracted or bulging.     Nose: No mucosal edema or rhinorrhea.     Right Sinus: No maxillary sinus tenderness or frontal sinus tenderness.     Left Sinus: No maxillary sinus tenderness or frontal sinus tenderness.     Mouth/Throat:     Pharynx: Uvula midline.  Eyes:     General: Lids are normal. Lids are everted, no foreign bodies appreciated.     Conjunctiva/sclera: Conjunctivae normal.     Pupils: Pupils are equal, round, and reactive to light.  Neck:     Thyroid : No thyroid  mass or thyromegaly.     Vascular: No carotid bruit.     Trachea: Trachea normal.  Cardiovascular:     Rate and Rhythm: Normal rate and regular rhythm.     Pulses: Normal pulses.     Heart sounds: Normal heart sounds, S1 normal and S2 normal. No murmur heard.    No friction rub. No gallop.  Pulmonary:     Effort: Pulmonary effort is normal. No tachypnea or respiratory distress.     Breath sounds: Normal breath sounds. No decreased breath sounds, wheezing, rhonchi or rales.  Abdominal:     General: Bowel sounds are normal.     Palpations: Abdomen is soft.      Tenderness: There is no abdominal tenderness.  Musculoskeletal:     Cervical back: Normal range of motion and neck supple.  Skin:    General: Skin is warm and dry.     Findings: No rash.  Neurological:  Mental Status: She is alert.  Psychiatric:        Mood and Affect: Mood is not anxious or depressed.        Speech: Speech normal.        Behavior: Behavior normal. Behavior is cooperative.        Thought Content: Thought content normal.        Judgment: Judgment normal.       Results for orders placed or performed in visit on 04/22/24  CBC with Differential/Platelet   Collection Time: 04/22/24 11:45 AM  Result Value Ref Range   WBC 9.8 4.0 - 10.5 K/uL   RBC 4.67 3.87 - 5.11 Mil/uL   Hemoglobin 13.7 12.0 - 15.0 g/dL   HCT 40.9 81.1 - 91.4 %   MCV 84.9 78.0 - 100.0 fl   MCHC 34.5 30.0 - 36.0 g/dL   RDW 78.2 95.6 - 21.3 %   Platelets 355.0 150.0 - 400.0 K/uL   Neutrophils Relative % 76.2 43.0 - 77.0 %   Lymphocytes Relative 17.4 12.0 - 46.0 %   Monocytes Relative 5.3 3.0 - 12.0 %   Eosinophils Relative 0.6 0.0 - 5.0 %   Basophils Relative 0.5 0.0 - 3.0 %   Neutro Abs 7.4 1.4 - 7.7 K/uL   Lymphs Abs 1.7 0.7 - 4.0 K/uL   Monocytes Absolute 0.5 0.1 - 1.0 K/uL   Eosinophils Absolute 0.1 0.0 - 0.7 K/uL   Basophils Absolute 0.1 0.0 - 0.1 K/uL  Iron, TIBC and Ferritin Panel   Collection Time: 04/22/24 11:45 AM  Result Value Ref Range   Iron 59 40 - 190 mcg/dL   TIBC 086 578 - 469 mcg/dL (calc)   %SAT 15 (L) 16 - 45 % (calc)   Ferritin 28 16 - 232 ng/mL  TSH   Collection Time: 04/22/24 11:45 AM  Result Value Ref Range   TSH 1.79 0.35 - 5.50 uIU/mL  Comprehensive metabolic panel with GFR   Collection Time: 04/22/24 11:45 AM  Result Value Ref Range   Sodium 134 (L) 135 - 145 mEq/L   Potassium 4.0 3.5 - 5.1 mEq/L   Chloride 100 96 - 112 mEq/L   CO2 27 19 - 32 mEq/L   Glucose, Bld 91 70 - 99 mg/dL   BUN 11 6 - 23 mg/dL   Creatinine, Ser 6.29 0.40 - 1.20 mg/dL   Total  Bilirubin 0.4 0.2 - 1.2 mg/dL   Alkaline Phosphatase 43 39 - 117 U/L   AST 13 0 - 37 U/L   ALT 11 0 - 35 U/L   Total Protein 7.2 6.0 - 8.3 g/dL   Albumin 4.4 3.5 - 5.2 g/dL   GFR 52.84 >13.24 mL/min   Calcium 9.5 8.4 - 10.5 mg/dL  Vitamin M01   Collection Time: 04/22/24 11:45 AM  Result Value Ref Range   Vitamin B-12 551 211 - 911 pg/mL  Methylmalonic Acid, Serum   Collection Time: 04/22/24 11:45 AM  Result Value Ref Range   Methylmalonic Acid, Quant 74 55 - 335 nmol/L  Folate   Collection Time: 04/22/24 11:45 AM  Result Value Ref Range   Folate 11.2 >5.9 ng/mL  Magnesium   Collection Time: 04/22/24 11:45 AM  Result Value Ref Range   Magnesium 1.7 1.5 - 2.5 mg/dL  Copper , Serum   Collection Time: 04/22/24 11:45 AM  Result Value Ref Range   Copper  137 70 - 175 mcg/dL  Zinc    Collection Time: 04/22/24 11:45 AM  Result Value  Ref Range   Zinc  71 60 - 130 mcg/dL  Estradiol    Collection Time: 04/22/24 11:45 AM  Result Value Ref Range   Estradiol  88 pg/mL  Follicle stimulating hormone   Collection Time: 04/22/24 11:45 AM  Result Value Ref Range   FSH 3.0 mIU/ML  Luteinizing hormone   Collection Time: 04/22/24 11:45 AM  Result Value Ref Range   LH 4.12 mIU/mL  Progesterone    Collection Time: 04/22/24 11:45 AM  Result Value Ref Range   Progesterone  4.6 ng/mL    Assessment and Plan  Essential hypertension, benign Assessment & Plan: Chronic, worsened control.  She has noted an increase in blood pressure at home in the last few days since restarting the iron.  Of note in office she has not taken spironolactone yet today.     Orders: -     CBC with Differential/Platelet -     Iron, TIBC and Ferritin Panel -     TSH -     Comprehensive metabolic panel with GFR  Vision changes  Acute nonintractable headache, unspecified headache type Assessment & Plan: Acute, no red flags or indication for head CT.  Most likely secondary to blood pressure elevation.  Will  evaluate with lab work. Possibly secondary to medication.   Other fatigue Assessment & Plan: Unclear etiology, will evaluate with labs.  Patient feels hormonal changes may be contributing.  Orders: -     VITAMIN D  25 Hydroxy (Vit-D Deficiency, Fractures); Future -     Vitamin B12 -     Methylmalonic acid, serum -     Folate -     Magnesium -     Copper , serum -     Zinc   Irregular menses Assessment & Plan: Patient requests hormone evaluation.  She feels she may be perimenopausal.  Orders: -     Estradiol  -     Follicle stimulating hormone -     Luteinizing hormone -     Progesterone     No follow-ups on file.   Herby Lolling, MD

## 2024-04-22 NOTE — Progress Notes (Signed)
 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 She was in her home and therapist was in her office. Client is aware of risks of telehealth and consented to a virtual visit. Mirza had signed up for a yoga class and had a first session with her new DBT therapist. She processed homework from DBT in which she was challenged to provide reasoning for having two therapists, sharing concerns about triangulation and conflicting guidance from the DBT therapist. Therapist echoed these concerns and encouraged Ahlayah toward pursuing DBT. She is scheduled to be seen again in one week.  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  Client Treatment Preferences  Analyse prefers in-person sessions, during school hours  Client Statement of Needs  Kaci shared that she is seeking parenting skills, as well as healthier coping skills.  Treatment Level  Weekly  Symptoms  Anxiety : restlessness, difficulty relaxing, racing thoughts, difficulty concentrating, difficulty with creating and adhering to plans and routines (Status: maintained). Depression: tearfulness (Status: maintained).  Problems Addressed  New Description, New Description  Goals 1. Raynette would like to process past experiences in order to better understand how they impact her at present Objective Xzandria will increase ability to connect to and understand emotional experiences, especially those related to past traumas  Target Date: 2025-03-26 Frequency: Weekly  Progress: 0 Modality: individual  Related Interventions Therapist will help Javeyah to consider organizational strategies in her day-to-day life, incorporating manualized treatments such as [name] Therapist will provide opportunities to process parenting  experiences in session and incorporate strategies from manualized parent training programs 2. Danyetta frequently experiences feelings of overwhelm that are difficult to cope with Objective Joleah would like to develop coping strategies and reduce the frequency of meltdowns  Target Date: 2025-03-26 Frequency: Weekly  Progress: 0 Modality: individual  Related Interventions Carmisha will be provided opportunities to process experiences in sessions Therapist will provide referrals for additional resources as appropriate  Therapist will help Nikima to identify and disengage from maladaptive thoughts and behaviors using CBT-based strategies Therapist will provide emotion regulation strategies including mindfulness, relaxation, and self care techniques Diagnosis Axis none 300.02 (Generalized anxiety disorder) - Open - [Signifier: n/a]    Axis none 314.01 (Attention deficit disorder with hyperactivity) - Open - [Signifier: n/a]    Conditions For Discharge Achievement of treatment goals and objectives     [name], PhD               [name], PhD

## 2024-04-22 NOTE — Assessment & Plan Note (Addendum)
 Chronic, worsened control.  She has noted an increase in blood pressure at home in the last few days since restarting the iron.  Of note in office she has not taken spironolactone yet today.

## 2024-04-27 ENCOUNTER — Encounter: Payer: Self-pay | Admitting: Family Medicine

## 2024-04-27 LAB — ZINC: Zinc: 71 ug/dL (ref 60–130)

## 2024-04-27 LAB — IRON,TIBC AND FERRITIN PANEL
%SAT: 15 % — ABNORMAL LOW (ref 16–45)
Ferritin: 28 ng/mL (ref 16–232)
Iron: 59 ug/dL (ref 40–190)
TIBC: 383 ug/dL (ref 250–450)

## 2024-04-27 LAB — ESTRADIOL: Estradiol: 88 pg/mL

## 2024-04-27 LAB — METHYLMALONIC ACID, SERUM: Methylmalonic Acid, Quant: 74 nmol/L (ref 55–335)

## 2024-04-27 LAB — PROGESTERONE: Progesterone: 4.6 ng/mL

## 2024-04-27 LAB — COPPER, SERUM: Copper: 137 ug/dL (ref 70–175)

## 2024-04-30 ENCOUNTER — Ambulatory Visit (INDEPENDENT_AMBULATORY_CARE_PROVIDER_SITE_OTHER): Admitting: Clinical

## 2024-04-30 DIAGNOSIS — F331 Major depressive disorder, recurrent, moderate: Secondary | ICD-10-CM

## 2024-04-30 DIAGNOSIS — F411 Generalized anxiety disorder: Secondary | ICD-10-CM | POA: Diagnosis not present

## 2024-04-30 DIAGNOSIS — F902 Attention-deficit hyperactivity disorder, combined type: Secondary | ICD-10-CM | POA: Diagnosis not present

## 2024-04-30 NOTE — Progress Notes (Signed)
 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 She reflected upon the death of her father in law, and emotions she had experienced related to memories that had been triggered of the death of her mother. She is scheduled to be seen again in one week.  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 She expresses herself well in writing.  Client Treatment Preferences  Sheliah prefers in-person sessions, during school hours  Client Statement of Needs  Drema shared that she is seeking parenting skills, as well as healthier coping skills.  Treatment Level  Weekly  Symptoms  Anxiety : restlessness, difficulty relaxing, racing thoughts, difficulty concentrating, difficulty with creating and adhering to plans and routines (Status: maintained). Depression: tearfulness (Status: maintained).  Problems Addressed  New Description, New Description  Goals 1. Eleesha would like to process past experiences in order to better understand how they impact her at present Objective Laree will increase ability to connect to and understand emotional experiences, especially those related to past traumas  Target Date: 2025-03-26 Frequency: Weekly  Progress: 0 Modality: individual  Related Interventions Therapist will help Shamsa to consider organizational strategies in her day-to-day life, incorporating manualized treatments such as [name] Therapist will provide opportunities to process parenting experiences in session and incorporate strategies from manualized parent training programs 2. Tamyrah frequently experiences feelings of overwhelm that are difficult to cope with Objective Whittany would like to develop coping strategies and reduce the frequency of meltdowns  Target Date: 2025-03-26 Frequency: Weekly  Progress: 0 Modality:  individual  Related Interventions Kitrina will be provided opportunities to process experiences in sessions Therapist will provide referrals for additional resources as appropriate  Therapist will help Serenitie to identify and disengage from maladaptive thoughts and behaviors using CBT-based strategies Therapist will provide emotion regulation strategies including mindfulness, relaxation, and self care techniques Diagnosis Axis none 300.02 (Generalized anxiety disorder) - Open - [Signifier: n/a]    Axis none 314.01 (Attention deficit disorder with hyperactivity) - Open - [Signifier: n/a]    Conditions For Discharge Achievement of treatment goals and objectives    [name], PhD               [name], PhD

## 2024-05-01 ENCOUNTER — Other Ambulatory Visit: Payer: Self-pay | Admitting: Family Medicine

## 2024-05-03 MED ORDER — ZALEPLON 5 MG PO CAPS: 5.0000 mg | ORAL_CAPSULE | Freq: Every evening | ORAL | 0 refills | Status: DC | PRN

## 2024-05-03 MED ORDER — CLONAZEPAM 0.5 MG PO TABS
0.5000 mg | ORAL_TABLET | Freq: Every day | ORAL | 0 refills | Status: DC | PRN
Start: 2024-05-03 — End: 2024-06-01

## 2024-05-03 NOTE — Telephone Encounter (Signed)
 Last office visit 04/22/2024 for Medical Management of Chronic Issues.  Last refilled  clonazepam  03/30/224 for #35 with no refills.  Zaleplon  03/30/24 for #30 with no refills.  Next Appt:  No future appointments.

## 2024-05-06 ENCOUNTER — Ambulatory Visit: Payer: 59 | Admitting: Clinical

## 2024-05-06 DIAGNOSIS — F331 Major depressive disorder, recurrent, moderate: Secondary | ICD-10-CM

## 2024-05-06 DIAGNOSIS — F902 Attention-deficit hyperactivity disorder, combined type: Secondary | ICD-10-CM

## 2024-05-06 DIAGNOSIS — F411 Generalized anxiety disorder: Secondary | ICD-10-CM
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
## 2024-05-12 DIAGNOSIS — R5382 Chronic fatigue, unspecified: Secondary | ICD-10-CM | POA: Insufficient documentation

## 2024-05-12 DIAGNOSIS — N926 Irregular menstruation, unspecified: Secondary | ICD-10-CM | POA: Insufficient documentation

## 2024-05-12 DIAGNOSIS — R5383 Other fatigue: Secondary | ICD-10-CM | POA: Insufficient documentation

## 2024-05-12 NOTE — Assessment & Plan Note (Signed)
 Unclear etiology, will evaluate with labs.  Patient feels hormonal changes may be contributing.

## 2024-05-12 NOTE — Assessment & Plan Note (Signed)
 Patient requests hormone evaluation.  She feels she may be perimenopausal.

## 2024-05-12 NOTE — Assessment & Plan Note (Signed)
 Acute, no red flags or indication for head CT.  Most likely secondary to blood pressure elevation.  Will evaluate with lab work. Possibly secondary to medication.

## 2024-05-20 ENCOUNTER — Ambulatory Visit: Payer: 59 | Admitting: Clinical

## 2024-05-20 DIAGNOSIS — F331 Major depressive disorder, recurrent, moderate: Secondary | ICD-10-CM

## 2024-05-20 DIAGNOSIS — F902 Attention-deficit hyperactivity disorder, combined type: Secondary | ICD-10-CM

## 2024-05-20 DIAGNOSIS — F411 Generalized anxiety disorder: Secondary | ICD-10-CM
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
## 2024-05-24 ENCOUNTER — Ambulatory Visit (INDEPENDENT_AMBULATORY_CARE_PROVIDER_SITE_OTHER): Admitting: Clinical

## 2024-05-24 DIAGNOSIS — F411 Generalized anxiety disorder: Secondary | ICD-10-CM | POA: Diagnosis not present

## 2024-05-24 DIAGNOSIS — F331 Major depressive disorder, recurrent, moderate: Secondary | ICD-10-CM | POA: Diagnosis not present

## 2024-05-24 DIAGNOSIS — F902 Attention-deficit hyperactivity disorder, combined type: Secondary | ICD-10-CM
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
## 2024-05-29 ENCOUNTER — Other Ambulatory Visit: Payer: Self-pay | Admitting: Family Medicine

## 2024-05-29 ENCOUNTER — Encounter: Payer: Self-pay | Admitting: Family Medicine

## 2024-05-31 NOTE — Telephone Encounter (Signed)
 Last office visit 04/22/2024 for medical management of chronic issues.  Last refilled 05/03/24 for #35 with no refills.  Next appt: No future  appointments.

## 2024-06-02 ENCOUNTER — Ambulatory Visit (INDEPENDENT_AMBULATORY_CARE_PROVIDER_SITE_OTHER): Admitting: Clinical

## 2024-06-02 DIAGNOSIS — F902 Attention-deficit hyperactivity disorder, combined type: Secondary | ICD-10-CM

## 2024-06-02 DIAGNOSIS — F411 Generalized anxiety disorder: Secondary | ICD-10-CM | POA: Diagnosis not present

## 2024-06-02 DIAGNOSIS — F331 Major depressive disorder, recurrent, moderate: Secondary | ICD-10-CM
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
## 2024-06-04 ENCOUNTER — Ambulatory Visit (INDEPENDENT_AMBULATORY_CARE_PROVIDER_SITE_OTHER): Admitting: Family Medicine

## 2024-06-04 ENCOUNTER — Encounter: Payer: Self-pay | Admitting: Family Medicine

## 2024-06-04 VITALS — BP 112/80 | HR 78 | Temp 98.0°F | Ht 64.0 in | Wt 242.2 lb

## 2024-06-04 DIAGNOSIS — L700 Acne vulgaris: Secondary | ICD-10-CM | POA: Diagnosis not present

## 2024-06-04 DIAGNOSIS — E559 Vitamin D deficiency, unspecified: Secondary | ICD-10-CM | POA: Diagnosis not present

## 2024-06-04 DIAGNOSIS — I10 Essential (primary) hypertension: Secondary | ICD-10-CM

## 2024-06-04 MED ORDER — SPIRONOLACTONE 50 MG PO TABS: 50.0000 mg | ORAL_TABLET | Freq: Two times a day (BID) | ORAL | Status: DC

## 2024-06-04 MED ORDER — TRETINOIN MICROSPHERE 0.1 % EX GEL: Freq: Every day | CUTANEOUS | 1 refills | Status: DC

## 2024-06-04 NOTE — Progress Notes (Signed)
 Patient ID: Andrea Colon, female    DOB: 11/27/1976, 48 y.o.   MRN: [id number]  This visit was conducted in person.  BP 112/80   Pulse 78   Temp 98 F (36.7 C) (Temporal)   Ht 5' 4 (1.626 m)   Wt 242 lb 4 oz (109.9 kg)   LMP 05/26/2024   SpO2 97%   BMI 41.58 kg/m    CC:  Chief Complaint  Patient presents with   Blood Pressure Check    Subjective:   HPI: Andrea Colon is a 48 y.o. female presenting on 06/04/2024 for Blood Pressure Check  Hypertension:   BP well controlled in office today.  Spironolactone 50 mg BID ( for acne BP Readings from Last 3 Encounters:  06/04/24 112/80  04/22/24 (!) 152/92  01/08/24 120/74   Using medication without problems or lightheadedness:  Chest pain with exertion: Edema: Short of breath: Average home BPs: Other issues:   She feels BP better since off iron.   Has upcoming referral to online hematologist. To consider iron infusions etc.  Heavy bleeding since starting progesterone  2 weeks ago.       Relevant past medical, surgical, family and social history reviewed and updated as indicated. Interim medical history since our last visit reviewed. Allergies and medications reviewed and updated. Outpatient Medications Prior to Visit  Medication Sig Dispense Refill   Cholecalciferol 100 MCG (4000 UT) CAPS Take 1 capsule by mouth daily.     clonazePAM  (KLONOPIN ) 0.5 MG tablet TAKE 1 TO 2 TABLETS BY MOUTH DAILY AS NEEDED FOR ANXIETY 35 tablet 0   estradiol  (VIVELLE -DOT) 0.05 MG/24HR patch Place 1 patch onto the skin 2 (two) times a week.     lamoTRIgine (LAMICTAL) 25 MG tablet Take 25 mg by mouth daily.     Omega-3 Fatty Acids (FISH OIL PO) Take 2,200 mg by mouth daily.     OVER THE COUNTER MEDICATION Take 100 mg by mouth at bedtime. Pharmagaba OTC     PRISTIQ 50 MG 24 hr tablet Take 50 mg by mouth daily.     progesterone  (PROMETRIUM ) 100 MG capsule Take 100 mg by mouth daily.     Tirzepatide  (MOUNJARO  Zwingle) Inject 2.3 mg into  the skin once a week.     tretinoin (RETIN-A) 0.025 % cream as needed.     zaleplon  (SONATA ) 5 MG capsule Take 1 capsule (5 mg total) by mouth at bedtime as needed for sleep. (Patient not taking: Reported on 06/04/2024) 30 capsule 0   GABAPENTIN PO Take 100 mg by mouth at bedtime. Capsule     VYVANSE  50 MG capsule Take 50 mg by mouth every morning. (Patient not taking: Reported on 06/04/2024)     No facility-administered medications prior to visit.     Per HPI unless specifically indicated in ROS section below Review of Systems  Constitutional:  Negative for fatigue and fever.  HENT:  Negative for congestion.   Eyes:  Negative for pain.  Respiratory:  Negative for cough and shortness of breath.   Cardiovascular:  Negative for chest pain, palpitations and leg swelling.  Gastrointestinal:  Negative for abdominal pain.  Genitourinary:  Negative for dysuria and vaginal bleeding.  Musculoskeletal:  Negative for back pain.  Neurological:  Negative for syncope, light-headedness and headaches.  Psychiatric/Behavioral:  Negative for dysphoric mood.    Objective:  BP 112/80   Pulse 78   Temp 98 F (36.7 C) (Temporal)   Ht 5' 4 (1.626 m)  Wt 242 lb 4 oz (109.9 kg)   LMP 05/26/2024   SpO2 97%   BMI 41.58 kg/m   Wt Readings from Last 3 Encounters:  06/04/24 242 lb 4 oz (109.9 kg)  04/22/24 247 lb 8 oz (112.3 kg)  01/08/24 243 lb 6 oz (110.4 kg)      Physical Exam Constitutional:      General: She is not in acute distress.    Appearance: Normal appearance. She is well-developed. She is not ill-appearing or toxic-appearing.  HENT:     Head: Normocephalic.     Right Ear: Hearing, tympanic membrane, ear canal and external ear normal. Tympanic membrane is not erythematous, retracted or bulging.     Left Ear: Hearing, tympanic membrane, ear canal and external ear normal. Tympanic membrane is not erythematous, retracted or bulging.     Nose: No mucosal edema or rhinorrhea.     Right  Sinus: No maxillary sinus tenderness or frontal sinus tenderness.     Left Sinus: No maxillary sinus tenderness or frontal sinus tenderness.     Mouth/Throat:     Pharynx: Uvula midline.   Eyes:     General: Lids are normal. Lids are everted, no foreign bodies appreciated.     Conjunctiva/sclera: Conjunctivae normal.     Pupils: Pupils are equal, round, and reactive to light.   Neck:     Thyroid : No thyroid  mass or thyromegaly.     Vascular: No carotid bruit.     Trachea: Trachea normal.   Cardiovascular:     Rate and Rhythm: Normal rate and regular rhythm.     Pulses: Normal pulses.     Heart sounds: Normal heart sounds, S1 normal and S2 normal. No murmur heard.    No friction rub. No gallop.  Pulmonary:     Effort: Pulmonary effort is normal. No tachypnea or respiratory distress.     Breath sounds: Normal breath sounds. No decreased breath sounds, wheezing, rhonchi or rales.  Abdominal:     General: Bowel sounds are normal.     Palpations: Abdomen is soft.     Tenderness: There is no abdominal tenderness.   Musculoskeletal:     Cervical back: Normal range of motion and neck supple.   Skin:    General: Skin is warm and dry.     Findings: Rash present. Rash is pustular.     Comments:  Face, t zone and cheeks   Neurological:     Mental Status: She is alert.   Psychiatric:        Mood and Affect: Mood is not anxious or depressed.        Speech: Speech normal.        Behavior: Behavior normal. Behavior is cooperative.        Thought Content: Thought content normal.        Judgment: Judgment normal.       Results for orders placed or performed in visit on 04/22/24  CBC with Differential/Platelet   Collection Time: 04/22/24 11:45 AM  Result Value Ref Range   WBC 9.8 4.0 - 10.5 K/uL   RBC 4.67 3.87 - 5.11 Mil/uL   Hemoglobin 13.7 12.0 - 15.0 g/dL   HCT 16.1 09.6 - 04.5 %   MCV 84.9 78.0 - 100.0 fl   MCHC 34.5 30.0 - 36.0 g/dL   RDW 40.9 81.1 - 91.4 %   Platelets  355.0 150.0 - 400.0 K/uL   Neutrophils Relative % 76.2 43.0 - 77.0 %  Lymphocytes Relative 17.4 12.0 - 46.0 %   Monocytes Relative 5.3 3.0 - 12.0 %   Eosinophils Relative 0.6 0.0 - 5.0 %   Basophils Relative 0.5 0.0 - 3.0 %   Neutro Abs 7.4 1.4 - 7.7 K/uL   Lymphs Abs 1.7 0.7 - 4.0 K/uL   Monocytes Absolute 0.5 0.1 - 1.0 K/uL   Eosinophils Absolute 0.1 0.0 - 0.7 K/uL   Basophils Absolute 0.1 0.0 - 0.1 K/uL  Iron, TIBC and Ferritin Panel   Collection Time: 04/22/24 11:45 AM  Result Value Ref Range   Iron 59 40 - 190 mcg/dL   TIBC 235 573 - 220 mcg/dL (calc)   %SAT 15 (L) 16 - 45 % (calc)   Ferritin 28 16 - 232 ng/mL  TSH   Collection Time: 04/22/24 11:45 AM  Result Value Ref Range   TSH 1.79 0.35 - 5.50 uIU/mL  Comprehensive metabolic panel with GFR   Collection Time: 04/22/24 11:45 AM  Result Value Ref Range   Sodium 134 (L) 135 - 145 mEq/L   Potassium 4.0 3.5 - 5.1 mEq/L   Chloride 100 96 - 112 mEq/L   CO2 27 19 - 32 mEq/L   Glucose, Bld 91 70 - 99 mg/dL   BUN 11 6 - 23 mg/dL   Creatinine, Ser 2.54 0.40 - 1.20 mg/dL   Total Bilirubin 0.4 0.2 - 1.2 mg/dL   Alkaline Phosphatase 43 39 - 117 U/L   AST 13 0 - 37 U/L   ALT 11 0 - 35 U/L   Total Protein 7.2 6.0 - 8.3 g/dL   Albumin 4.4 3.5 - 5.2 g/dL   GFR 27.06 >23.76 mL/min   Calcium 9.5 8.4 - 10.5 mg/dL  Vitamin E83   Collection Time: 04/22/24 11:45 AM  Result Value Ref Range   Vitamin B-12 551 211 - 911 pg/mL  Methylmalonic Acid, Serum   Collection Time: 04/22/24 11:45 AM  Result Value Ref Range   Methylmalonic Acid, Quant 74 55 - 335 nmol/L  Folate   Collection Time: 04/22/24 11:45 AM  Result Value Ref Range   Folate 11.2 >5.9 ng/mL  Magnesium   Collection Time: 04/22/24 11:45 AM  Result Value Ref Range   Magnesium 1.7 1.5 - 2.5 mg/dL  Copper , Serum   Collection Time: 04/22/24 11:45 AM  Result Value Ref Range   Copper  137 70 - 175 mcg/dL  Zinc    Collection Time: 04/22/24 11:45 AM  Result Value Ref Range    Zinc  71 60 - 130 mcg/dL  Estradiol    Collection Time: 04/22/24 11:45 AM  Result Value Ref Range   Estradiol  88 pg/mL  Follicle stimulating hormone   Collection Time: 04/22/24 11:45 AM  Result Value Ref Range   FSH 3.0 mIU/ML  Luteinizing hormone   Collection Time: 04/22/24 11:45 AM  Result Value Ref Range   LH 4.12 mIU/mL  Progesterone    Collection Time: 04/22/24 11:45 AM  Result Value Ref Range   Progesterone  4.6 ng/mL    Assessment and Plan  Vitamin D  deficiency Assessment & Plan: Due for re-eval.  Orders: -     VITAMIN D  25 Hydroxy (Vit-D Deficiency, Fractures)  Essential hypertension, benign Assessment & Plan: Stable, chronic.  Continue current medication.  Spironolactone 50 mg BID   Acne vulgaris Assessment & Plan: Chronic, improved on spironolactone but still with occ outbreaks... refill retin A micro .   Other orders -     Spironolactone; Take 1 tablet (50 mg total) by mouth  2 (two) times daily. -     Tretinoin Microsphere; Apply topically at bedtime.  Dispense: 45 g; Refill: 1    No follow-ups on file.   Herby Lolling, MD

## 2024-06-04 NOTE — Assessment & Plan Note (Signed)
 Due for re-eval.

## 2024-06-04 NOTE — Assessment & Plan Note (Signed)
 Stable, chronic.  Continue current medication.  Spironolactone 50 mg BID

## 2024-06-04 NOTE — Assessment & Plan Note (Signed)
 Chronic, improved on spironolactone but still with occ outbreaks... refill retin A micro .

## 2024-06-05 LAB — VITAMIN D 25 HYDROXY (VIT D DEFICIENCY, FRACTURES): Vit D, 25-Hydroxy: 61 ng/mL (ref 30–100)

## 2024-06-10 ENCOUNTER — Ambulatory Visit: Payer: Self-pay | Admitting: Family Medicine

## 2024-06-10 ENCOUNTER — Ambulatory Visit (INDEPENDENT_AMBULATORY_CARE_PROVIDER_SITE_OTHER): Admitting: Clinical

## 2024-06-10 DIAGNOSIS — F902 Attention-deficit hyperactivity disorder, combined type: Secondary | ICD-10-CM | POA: Diagnosis not present

## 2024-06-10 DIAGNOSIS — F411 Generalized anxiety disorder: Secondary | ICD-10-CM | POA: Diagnosis not present

## 2024-06-10 DIAGNOSIS — F331 Major depressive disorder, recurrent, moderate: Secondary | ICD-10-CM
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
## 2024-06-17 ENCOUNTER — Ambulatory Visit: Admitting: Clinical

## 2024-06-17 DIAGNOSIS — F331 Major depressive disorder, recurrent, moderate: Secondary | ICD-10-CM | POA: Diagnosis not present

## 2024-06-17 DIAGNOSIS — F902 Attention-deficit hyperactivity disorder, combined type: Secondary | ICD-10-CM | POA: Diagnosis not present

## 2024-06-17 DIAGNOSIS — F411 Generalized anxiety disorder: Secondary | ICD-10-CM
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
## 2024-06-24 ENCOUNTER — Ambulatory Visit: Admitting: Clinical

## 2024-06-24 DIAGNOSIS — F331 Major depressive disorder, recurrent, moderate: Secondary | ICD-10-CM

## 2024-06-24 DIAGNOSIS — F330 Major depressive disorder, recurrent, mild: Secondary | ICD-10-CM

## 2024-06-24 DIAGNOSIS — F902 Attention-deficit hyperactivity disorder, combined type: Secondary | ICD-10-CM | POA: Diagnosis not present

## 2024-06-24 DIAGNOSIS — F411 Generalized anxiety disorder: Secondary | ICD-10-CM
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
## 2024-06-30 ENCOUNTER — Ambulatory Visit: Admitting: Clinical

## 2024-07-01 ENCOUNTER — Ambulatory Visit: Admitting: Clinical

## 2024-07-05 ENCOUNTER — Other Ambulatory Visit: Payer: Self-pay | Admitting: Family Medicine

## 2024-07-05 NOTE — Telephone Encounter (Signed)
 Last office visit 06/04/2024 for HTN, Vit D deficiency and Acne.  Last refilled clonazepam  06/01/2024 for #35 with no refills.  Zaleplon  05/03/2024 for #30 with no refills.  Next appt: No future appointments.

## 2024-07-08 ENCOUNTER — Ambulatory Visit (INDEPENDENT_AMBULATORY_CARE_PROVIDER_SITE_OTHER): Admitting: Clinical

## 2024-07-08 DIAGNOSIS — F331 Major depressive disorder, recurrent, moderate: Secondary | ICD-10-CM

## 2024-07-08 DIAGNOSIS — F411 Generalized anxiety disorder: Secondary | ICD-10-CM

## 2024-07-08 DIAGNOSIS — F902 Attention-deficit hyperactivity disorder, combined type: Secondary | ICD-10-CM
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
## 2024-07-15 ENCOUNTER — Ambulatory Visit: Admitting: Clinical

## 2024-07-15 DIAGNOSIS — F331 Major depressive disorder, recurrent, moderate: Secondary | ICD-10-CM | POA: Diagnosis not present

## 2024-07-15 DIAGNOSIS — F411 Generalized anxiety disorder: Secondary | ICD-10-CM | POA: Diagnosis not present

## 2024-07-15 DIAGNOSIS — F902 Attention-deficit hyperactivity disorder, combined type: Secondary | ICD-10-CM
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
## 2024-07-22 ENCOUNTER — Ambulatory Visit (INDEPENDENT_AMBULATORY_CARE_PROVIDER_SITE_OTHER): Admitting: Clinical

## 2024-07-22 DIAGNOSIS — F902 Attention-deficit hyperactivity disorder, combined type: Secondary | ICD-10-CM

## 2024-07-22 DIAGNOSIS — F411 Generalized anxiety disorder: Secondary | ICD-10-CM

## 2024-07-22 DIAGNOSIS — F331 Major depressive disorder, recurrent, moderate: Secondary | ICD-10-CM
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
## 2024-07-29 ENCOUNTER — Ambulatory Visit (INDEPENDENT_AMBULATORY_CARE_PROVIDER_SITE_OTHER): Admitting: Clinical

## 2024-07-29 DIAGNOSIS — F411 Generalized anxiety disorder: Secondary | ICD-10-CM | POA: Diagnosis not present

## 2024-07-29 DIAGNOSIS — F331 Major depressive disorder, recurrent, moderate: Secondary | ICD-10-CM | POA: Diagnosis not present

## 2024-07-29 DIAGNOSIS — F902 Attention-deficit hyperactivity disorder, combined type: Secondary | ICD-10-CM | POA: Diagnosis not present
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
## 2024-08-04 ENCOUNTER — Telehealth: Admitting: Family Medicine

## 2024-08-04 ENCOUNTER — Encounter: Payer: Self-pay | Admitting: Family Medicine

## 2024-08-04 VITALS — HR 76 | Ht 64.0 in | Wt 235.0 lb

## 2024-08-04 DIAGNOSIS — F330 Major depressive disorder, recurrent, mild: Secondary | ICD-10-CM | POA: Diagnosis not present

## 2024-08-04 DIAGNOSIS — I10 Essential (primary) hypertension: Secondary | ICD-10-CM | POA: Diagnosis not present

## 2024-08-04 MED ORDER — SPIRONOLACTONE 50 MG PO TABS: 50.0000 mg | ORAL_TABLET | Freq: Two times a day (BID) | ORAL | 1 refills | Status: DC

## 2024-08-04 MED ORDER — FLUOXETINE HCL 10 MG PO CAPS: 10.0000 mg | ORAL_CAPSULE | Freq: Every day | ORAL | 0 refills | Status: DC

## 2024-08-04 NOTE — Assessment & Plan Note (Signed)
 Stable, chronic.  Continue current medication.  Spironolactone  50 mg BID

## 2024-08-04 NOTE — Progress Notes (Signed)
 VIRTUAL VISIT A virtual visit is felt to be most appropriate for this patient at this time.   I connected with the patient on 08/04/24 at 12:00 PM EDT by virtual telehealth platform and verified that I am speaking with the correct person using two identifiers.   I discussed the limitations, risks, security and privacy concerns of performing an evaluation and management service by  virtual telehealth platform and the availability of in person appointments. I also discussed with the patient that there may be a patient responsible charge related to this service. The patient expressed understanding and agreed to proceed.  Patient location: Home Provider Location: [place] Participants: Greig Ring and [name] DELENA Munster   Chief Complaint  Patient presents with   Medication    Discuss 2 medications the pristiq and spironolactone . She is wanting to see about taper one of these but the provider that was helping quit suddenly     History of Present Illness:  48 y.o. female patient of Nancyann Cotterman E, MD presents with  medication questions  She is treated for MDD, PMDD, GAD with pristiq 50 mg daily, Klonopin  Was unable to talk to Dr. Prentiss about weaning off. She feels pristiq is  making her feeling tired foggy and cold. Having SE coming down to 25 mg in last week.. feeling dizzy and nauseous   She would like to see where    She struggles to fall asleep at night, trouble staying asleep.  Also change Sonata  back Ambien  10 mg at bedtime.... Ambien  helps much better.   She is on Lamictal that may be helping enough with mood.  In past has felt bad coming off Cymbalta  in past so tapered off using  prozac .  She is on spironolactone  for acne and BP meds. BP Readings from Last 3 Encounters:  06/04/24 112/80  04/22/24 (!) 152/92  01/08/24 120/74   Wt Readings from Last 3 Encounters:  08/04/24 235 lb (106.6 kg)  06/04/24 242 lb 4 oz (109.9 kg)  04/22/24 247 lb 8 oz (112.3 kg)        COVID 19 screen No recent travel or known exposure to COVID19 The patient denies respiratory symptoms of COVID 19 at this time.  The importance of social distancing was discussed today.   Review of Systems  Constitutional:  Negative for chills and fever.  HENT:  Negative for congestion and ear pain.   Eyes:  Negative for pain and redness.  Respiratory:  Negative for cough and shortness of breath.   Cardiovascular:  Negative for chest pain, palpitations and leg swelling.  Gastrointestinal:  Negative for abdominal pain, blood in stool, constipation, diarrhea, nausea and vomiting.  Genitourinary:  Negative for dysuria.  Musculoskeletal:  Negative for falls and myalgias.  Skin:  Negative for rash.  Neurological:  Negative for dizziness.  Psychiatric/Behavioral:  Positive for depression. The patient is nervous/anxious and has insomnia.       Past Medical History:  Diagnosis Date   Acne    ADD (attention deficit disorder)    Anemia    H/O   Anxiety    B12 deficiency    Back pain    Bilateral swelling of feet and ankles    Chronic SI joint pain    Constipation    Depression    Edema of both lower extremities    Family history of adverse reaction to anesthesia    Gallbladder problem    GERD (gastroesophageal reflux disease)    High triglycerides  Hyperlipidemia    Hypertension    Insomnia    Joint pain    Neuromuscular disorder (HCC)    SI Joint inflamation   Other fatigue    Palpitations    PCOS (polycystic ovarian syndrome)    Pre-diabetes    Shortness of breath on exertion    SI (sacroiliac) joint dysfunction    Vitamin D  deficiency     reports that she quit smoking about 9 years ago. Her smoking use included cigarettes. She started smoking about 19 years ago. She has a 10 pack-year smoking history. She has never used smokeless tobacco. She reports that she does not drink alcohol and does not use drugs.   Current Outpatient Medications:    Cholecalciferol  100 MCG (4000 UT) CAPS, Take 1 capsule by mouth daily., Disp: , Rfl:    clonazePAM  (KLONOPIN ) 0.5 MG tablet, TAKE 1-2 TABLETS BY MOUTH DAILY AS NEEDED FOR ANXIETY, Disp: 35 tablet, Rfl: 0   estradiol  (VIVELLE -DOT) 0.05 MG/24HR patch, Place 1 patch onto the skin 2 (two) times a week., Disp: , Rfl:    FLUoxetine  (PROZAC ) 10 MG capsule, Take 1 capsule (10 mg total) by mouth daily., Disp: 90 capsule, Rfl: 0   gabapentin (NEURONTIN) 100 MG capsule, Take 100 mg by mouth daily., Disp: , Rfl:    lamoTRIgine (LAMICTAL) 25 MG tablet, Take 25 mg by mouth daily., Disp: , Rfl:    Omega-3 Fatty Acids (FISH OIL PO), Take 2,200 mg by mouth daily., Disp: , Rfl:    OVER THE COUNTER MEDICATION, Take 100 mg by mouth at bedtime. Pharmagaba OTC, Disp: , Rfl:    progesterone  (PROMETRIUM ) 100 MG capsule, Take 100 mg by mouth daily., Disp: , Rfl:    Tirzepatide  (MOUNJARO  ), Inject 2.3 mg into the skin once a week., Disp: , Rfl:    tretinoin  (RETIN-A ) 0.025 % cream, as needed., Disp: , Rfl:    tretinoin  microspheres (RETIN-A  MICRO) 0.1 % gel, Apply topically at bedtime., Disp: 45 g, Rfl: 1   zaleplon  (SONATA ) 5 MG capsule, TAKE 1 CAPSULE BY MOUTH EVERY NIGHT AT BEDTIME AS NEEDED FOR SLEEP, Disp: 30 capsule, Rfl: 0   spironolactone  (ALDACTONE ) 50 MG tablet, Take 1 tablet (50 mg total) by mouth 2 (two) times daily., Disp: 180 tablet, Rfl: 1   Observations/Objective: Pulse 76, height 5' 4 (1.626 m), weight 235 lb (106.6 kg), SpO2 96%.  Physical Exam Constitutional:      General: The patient is not in acute distress. Pulmonary:     Effort: Pulmonary effort is normal. No respiratory distress.  Neurological:     Mental Status: The patient is alert and oriented to person, place, and time.  Psychiatric:        Mood and Affect: Mood normal.        Behavior: Behavior normal.    Assessment and Plan Essential hypertension, benign Assessment & Plan: Stable, chronic.  Continue current medication.  Spironolactone  50 mg  BID   Depression, major, recurrent, mild (HCC) Assessment & Plan: Chronic, inadequate control on Pristiq 50 mg daily.  She is interested in coming off Pristiq and has begun to wean down from 50 mg to 25 mg.  She states that coming off of this medication as she has had in the past gives her severe side effects.  In the past her psychiatrist had prescribed duloxetine  to assist with tapering given it has a longer half-life. I have sent in a prescription for fluoxetine  10 mg daily for her to use  wean off over time.  If her sleep issues do not improve with addition of fluoxetine  we can reconsider changing Sonata  back to Ambien . She feels Ambien  works better for her sleep but does make her tired in the morning.   Other orders -     Spironolactone ; Take 1 tablet (50 mg total) by mouth 2 (two) times daily.  Dispense: 180 tablet; Refill: 1 -     FLUoxetine  HCl; Take 1 capsule (10 mg total) by mouth daily.  Dispense: 90 capsule; Refill: 0      I discussed the assessment and treatment plan with the patient. The patient was provided an opportunity to ask questions and all were answered. The patient agreed with the plan and demonstrated an understanding of the instructions.   The patient was advised to call back or seek an in-person evaluation if the symptoms worsen or if the condition fails to improve as anticipated.     Greig Ring, MD

## 2024-08-04 NOTE — Assessment & Plan Note (Signed)
 Chronic, inadequate control on Pristiq 50 mg daily.  She is interested in coming off Pristiq and has begun to wean down from 50 mg to 25 mg.  She states that coming off of this medication as she has had in the past gives her severe side effects.  In the past her psychiatrist had prescribed duloxetine  to assist with tapering given it has a longer half-life. I have sent in a prescription for fluoxetine  10 mg daily for her to use wean off over time.  If her sleep issues do not improve with addition of fluoxetine  we can reconsider changing Sonata  back to Ambien . She feels Ambien  works better for her sleep but does make her tired in the morning.

## 2024-08-08 ENCOUNTER — Encounter: Payer: Self-pay | Admitting: Family Medicine

## 2024-08-09 ENCOUNTER — Other Ambulatory Visit: Payer: Self-pay | Admitting: Family Medicine

## 2024-08-09 NOTE — Telephone Encounter (Signed)
 Last office visit 08/04/2024 for HTN and MDD.  Last refilled 07/06/24 for #35 with no refills.  Next Appt: CPE 09/02/24.

## 2024-08-10 MED ORDER — CLONAZEPAM 0.5 MG PO TABS: 0.5000 mg | ORAL_TABLET | Freq: Every day | ORAL | 0 refills | Status: DC | PRN

## 2024-08-11 ENCOUNTER — Other Ambulatory Visit: Payer: Self-pay | Admitting: Family Medicine

## 2024-08-11 MED ORDER — ZOLPIDEM TARTRATE 10 MG PO TABS: 10.0000 mg | ORAL_TABLET | Freq: Every evening | ORAL | 1 refills | Status: DC | PRN

## 2024-08-11 MED ORDER — SPIRONOLACTONE 50 MG PO TABS: 50.0000 mg | ORAL_TABLET | Freq: Two times a day (BID) | ORAL | 1 refills | Status: DC

## 2024-08-12 ENCOUNTER — Ambulatory Visit: Admitting: Clinical

## 2024-08-12 DIAGNOSIS — F331 Major depressive disorder, recurrent, moderate: Secondary | ICD-10-CM

## 2024-08-12 DIAGNOSIS — F411 Generalized anxiety disorder: Secondary | ICD-10-CM

## 2024-08-12 DIAGNOSIS — F902 Attention-deficit hyperactivity disorder, combined type: Secondary | ICD-10-CM | POA: Diagnosis not present
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
## 2024-08-19 ENCOUNTER — Ambulatory Visit: Admitting: Clinical

## 2024-08-19 DIAGNOSIS — F902 Attention-deficit hyperactivity disorder, combined type: Secondary | ICD-10-CM | POA: Diagnosis not present

## 2024-08-19 DIAGNOSIS — F411 Generalized anxiety disorder: Secondary | ICD-10-CM | POA: Diagnosis not present

## 2024-08-19 DIAGNOSIS — F331 Major depressive disorder, recurrent, moderate: Secondary | ICD-10-CM | POA: Diagnosis not present
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
## 2024-08-26 ENCOUNTER — Ambulatory Visit (INDEPENDENT_AMBULATORY_CARE_PROVIDER_SITE_OTHER): Admitting: Clinical

## 2024-08-26 DIAGNOSIS — F902 Attention-deficit hyperactivity disorder, combined type: Secondary | ICD-10-CM | POA: Diagnosis not present

## 2024-08-26 DIAGNOSIS — F411 Generalized anxiety disorder: Secondary | ICD-10-CM

## 2024-08-26 DIAGNOSIS — F331 Major depressive disorder, recurrent, moderate: Secondary | ICD-10-CM | POA: Diagnosis not present
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
## 2024-08-31 ENCOUNTER — Ambulatory Visit (INDEPENDENT_AMBULATORY_CARE_PROVIDER_SITE_OTHER): Admitting: Clinical

## 2024-08-31 DIAGNOSIS — F902 Attention-deficit hyperactivity disorder, combined type: Secondary | ICD-10-CM

## 2024-08-31 DIAGNOSIS — F411 Generalized anxiety disorder: Secondary | ICD-10-CM | POA: Diagnosis not present

## 2024-08-31 DIAGNOSIS — F331 Major depressive disorder, recurrent, moderate: Secondary | ICD-10-CM
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
## 2024-09-02 ENCOUNTER — Other Ambulatory Visit: Payer: Self-pay | Admitting: Family Medicine

## 2024-09-02 ENCOUNTER — Encounter: Admitting: Family Medicine

## 2024-09-02 ENCOUNTER — Ambulatory Visit: Admitting: Clinical

## 2024-09-02 DIAGNOSIS — F411 Generalized anxiety disorder: Secondary | ICD-10-CM

## 2024-09-03 NOTE — Telephone Encounter (Signed)
 Name of Medication:  Clonazepam  Name of Pharmacy:  [facility] Last [place] or Written Date and Quantity:  08/10/24, #35 Last Office Visit and Type:  06/04/24, HTN f/u Next Office Visit and Type:  09/07/24, CP/ mood f/u Last Controlled Substance Agreement Date:  09/23/14 Last UDS:  04/06/15

## 2024-09-07 ENCOUNTER — Encounter: Admitting: Family Medicine

## 2024-09-09 ENCOUNTER — Ambulatory Visit (INDEPENDENT_AMBULATORY_CARE_PROVIDER_SITE_OTHER): Admitting: Clinical

## 2024-09-09 ENCOUNTER — Encounter: Admitting: Family Medicine

## 2024-09-09 DIAGNOSIS — F411 Generalized anxiety disorder: Secondary | ICD-10-CM

## 2024-09-09 DIAGNOSIS — F331 Major depressive disorder, recurrent, moderate: Secondary | ICD-10-CM

## 2024-09-09 DIAGNOSIS — F902 Attention-deficit hyperactivity disorder, combined type: Secondary | ICD-10-CM | POA: Diagnosis not present
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
## 2024-09-13 ENCOUNTER — Ambulatory Visit (INDEPENDENT_AMBULATORY_CARE_PROVIDER_SITE_OTHER): Admitting: Clinical

## 2024-09-13 DIAGNOSIS — F331 Major depressive disorder, recurrent, moderate: Secondary | ICD-10-CM

## 2024-09-13 DIAGNOSIS — F411 Generalized anxiety disorder: Secondary | ICD-10-CM | POA: Diagnosis not present

## 2024-09-13 DIAGNOSIS — F902 Attention-deficit hyperactivity disorder, combined type: Secondary | ICD-10-CM | POA: Diagnosis not present
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
## 2024-09-16 ENCOUNTER — Ambulatory Visit: Admitting: Clinical

## 2024-09-22 ENCOUNTER — Ambulatory Visit: Admitting: Clinical

## 2024-09-22 DIAGNOSIS — F331 Major depressive disorder, recurrent, moderate: Secondary | ICD-10-CM

## 2024-09-22 DIAGNOSIS — F411 Generalized anxiety disorder: Secondary | ICD-10-CM

## 2024-09-22 DIAGNOSIS — F902 Attention-deficit hyperactivity disorder, combined type: Secondary | ICD-10-CM
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
## 2024-09-23 ENCOUNTER — Encounter: Payer: Self-pay | Admitting: Family Medicine

## 2024-09-23 ENCOUNTER — Ambulatory Visit: Admitting: Clinical

## 2024-09-23 ENCOUNTER — Ambulatory Visit (INDEPENDENT_AMBULATORY_CARE_PROVIDER_SITE_OTHER): Admitting: Family Medicine

## 2024-09-23 VITALS — BP 110/78 | HR 84 | Temp 98.2°F | Ht 63.5 in | Wt 233.1 lb

## 2024-09-23 DIAGNOSIS — Z0000 Encounter for general adult medical examination without abnormal findings: Secondary | ICD-10-CM | POA: Diagnosis not present

## 2024-09-23 DIAGNOSIS — F411 Generalized anxiety disorder: Secondary | ICD-10-CM

## 2024-09-23 DIAGNOSIS — I10 Essential (primary) hypertension: Secondary | ICD-10-CM | POA: Diagnosis not present

## 2024-09-23 DIAGNOSIS — E66812 Obesity, class 2: Secondary | ICD-10-CM

## 2024-09-23 DIAGNOSIS — R6889 Other general symptoms and signs: Secondary | ICD-10-CM | POA: Diagnosis not present

## 2024-09-23 DIAGNOSIS — R5383 Other fatigue: Secondary | ICD-10-CM | POA: Diagnosis not present

## 2024-09-23 DIAGNOSIS — Z6835 Body mass index (BMI) 35.0-35.9, adult: Secondary | ICD-10-CM | POA: Diagnosis not present

## 2024-09-23 DIAGNOSIS — E559 Vitamin D deficiency, unspecified: Secondary | ICD-10-CM

## 2024-09-23 DIAGNOSIS — E781 Pure hyperglyceridemia: Secondary | ICD-10-CM

## 2024-09-23 LAB — COMPREHENSIVE METABOLIC PANEL WITH GFR
ALT: 10 U/L (ref 0–35)
AST: 13 U/L (ref 0–37)
Albumin: 4.3 g/dL (ref 3.5–5.2)
Alkaline Phosphatase: 40 U/L (ref 39–117)
BUN: 9 mg/dL (ref 6–23)
CO2: 28 meq/L (ref 19–32)
Calcium: 9.5 mg/dL (ref 8.4–10.5)
Chloride: 100 meq/L (ref 96–112)
Creatinine, Ser: 0.77 mg/dL (ref 0.40–1.20)
GFR: 90.86 mL/min (ref >60.00)
Glucose, Bld: 84 mg/dL (ref 70–99)
Potassium: 4.2 meq/L (ref 3.5–5.1)
Sodium: 137 meq/L (ref 135–145)
Total Bilirubin: 0.4 mg/dL (ref 0.2–1.2)
Total Protein: 7 g/dL (ref 6.0–8.3)

## 2024-09-23 LAB — LIPID PANEL
Cholesterol: 163 mg/dL (ref 0–200)
HDL: 30.9 mg/dL — ABNORMAL LOW (ref >39.00)
LDL Cholesterol: 107 mg/dL — ABNORMAL HIGH (ref 0–99)
NonHDL: 131.69
Total CHOL/HDL Ratio: 5
Triglycerides: 121 mg/dL (ref 0.0–149.0)
VLDL: 24.2 mg/dL (ref 0.0–40.0)

## 2024-09-23 LAB — SEDIMENTATION RATE: Sed Rate: 43 mm/h — ABNORMAL HIGH (ref 0–20)

## 2024-09-23 LAB — MAGNESIUM: Magnesium: 1.7 mg/dL (ref 1.5–2.5)

## 2024-09-23 LAB — HEMOGLOBIN A1C: Hgb A1c MFr Bld: 5.5 % (ref 4.6–6.5)

## 2024-09-23 LAB — VITAMIN D 25 HYDROXY (VIT D DEFICIENCY, FRACTURES): VITD: 24.54 ng/mL — ABNORMAL LOW (ref 30.00–100.00)

## 2024-09-23 LAB — T4, FREE: Free T4: 0.79 ng/dL (ref 0.60–1.60)

## 2024-09-23 LAB — VITAMIN B12: Vitamin B-12: 502 pg/mL (ref 211–911)

## 2024-09-23 LAB — TSH: TSH: 1.73 u[IU]/mL (ref 0.35–5.50)

## 2024-09-23 LAB — C-REACTIVE PROTEIN: CRP: 1.4 mg/dL (ref 0.5–20.0)

## 2024-09-23 LAB — T3, FREE: T3, Free: 3.7 pg/mL (ref 2.3–4.2)

## 2024-09-23 LAB — FOLATE: Folate: 7.2 ng/mL (ref >5.9)

## 2024-09-23 NOTE — Assessment & Plan Note (Addendum)
-   Resolved at last check

## 2024-09-23 NOTE — Assessment & Plan Note (Signed)
 Due for reevaluation

## 2024-09-23 NOTE — Progress Notes (Signed)
 Patient ID: Andrea Colon, female    DOB: 1976/02/20, 48 y.o.   MRN: [id number]  This visit was conducted in person.  BP 110/78   Pulse 84   Temp 98.2 F (36.8 C) (Temporal)   Ht 5' 3.5 (1.613 m)   Wt 233 lb 2 oz (105.7 kg)   LMP 08/28/2024   SpO2 99%   BMI 40.65 kg/m    CC:  Chief Complaint  Patient presents with   Annual Exam    Subjective:   HPI: Andrea Colon is a 48 y.o. female presenting on 09/23/2024 for Annual Exam The patient presents for complete physical and review of chronic health problems. He/She also has the following acute concerns today:  contnued fatigue and cold intolerance in last year.   She has started zinc  supplement... using 10 off and on... wants to recheck  Levels.  Vit D:  low.. she has now restarted 4000IU every oither day.   Prediabetes  Lab Results  Component Value Date   HGBA1C 5.3 06/13/2023   Hypertension:    Well controlled. On spironolactone  50 mg daily  for acne . BP Readings from Last 3 Encounters:  09/23/24 110/78  06/04/24 112/80  04/22/24 (!) 152/92  Using medication without problems or lightheadedness:   occ Chest pain with exertion: none Edema: none Short of breath: none Average home BPs: Other issues:   Due for lab re-eval.   She has continued to lose weight in last several months  On Mounjaro  weekly... having to keep dose low as occ randomly triggers vomiting. Wt Readings from Last 3 Encounters:  09/23/24 233 lb 2 oz (105.7 kg)  08/04/24 235 lb (106.6 kg)  06/04/24 242 lb 4 oz (109.9 kg)  Body mass index is 40.65 kg/m.      MDD/GAD. Insomnia: Tolerable control on Pristiq 25 mg daily, Lamictal 25 mg daily, gabapentin 100 mg daily  Has not yet changed over to  prozac . Using Ambien  10 mg at bedtime as needed  using clonazepam  1-2 tabs daily.  Relevant past medical, surgical, family and social history reviewed and updated as indicated. Interim medical history since our last visit reviewed. Allergies  and medications reviewed and updated. Outpatient Medications Prior to Visit  Medication Sig Dispense Refill   Cholecalciferol 100 MCG (4000 UT) CAPS Take 1 capsule by mouth daily.     clonazePAM  (KLONOPIN ) 0.5 MG tablet TAKE 1 TO 2 TABLETS DAILY AS NEEDED FOR ANXIETY 35 tablet 0   estradiol  (VIVELLE -DOT) 0.05 MG/24HR patch Place 1 patch onto the skin 2 (two) times a week.     gabapentin (NEURONTIN) 100 MG capsule Take 100 mg by mouth daily.     lamoTRIgine (LAMICTAL) 25 MG tablet Take 25 mg by mouth daily.     MOUNJARO  15 MG/0.5ML Pen Inject 1.8 mg into the skin once a week.     Omega-3 Fatty Acids (FISH OIL PO) Take 2,200 mg by mouth daily.     OVER THE COUNTER MEDICATION Take 100 mg by mouth at bedtime. Pharmagaba OTC     PRISTIQ 25 MG 24 hr tablet Take 25 mg by mouth daily.     progesterone  (PROMETRIUM ) 100 MG capsule Take 100 mg by mouth daily.     spironolactone  (ALDACTONE ) 50 MG tablet Take 1 tablet (50 mg total) by mouth 2 (two) times daily. 180 tablet 1   tretinoin  (RETIN-A ) 0.025 % cream as needed.     tretinoin  microspheres (RETIN-A  MICRO) 0.1 % gel Apply  topically at bedtime. 45 g 1   zolpidem  (AMBIEN ) 10 MG tablet Take 1 tablet (10 mg total) by mouth at bedtime as needed for sleep. 30 tablet 1   Tirzepatide  (MOUNJARO  Prosper) Inject 2.3 mg into the skin once a week.     FLUoxetine  (PROZAC ) 10 MG capsule Take 1 capsule (10 mg total) by mouth daily. (Patient not taking: Reported on 09/23/2024) 90 capsule 0   No facility-administered medications prior to visit.     Per HPI unless specifically indicated in ROS section below Review of Systems  Constitutional:  Negative for fatigue and fever.  HENT:  Negative for congestion.   Eyes:  Negative for pain.  Respiratory:  Negative for cough and shortness of breath.   Cardiovascular:  Negative for chest pain, palpitations and leg swelling.  Gastrointestinal:  Negative for abdominal pain.  Genitourinary:  Negative for dysuria and vaginal  bleeding.  Musculoskeletal:  Negative for back pain.  Neurological:  Negative for syncope, light-headedness and headaches.  Psychiatric/Behavioral:  Negative for dysphoric mood.    Objective:  BP 110/78   Pulse 84   Temp 98.2 F (36.8 C) (Temporal)   Ht 5' 3.5 (1.613 m)   Wt 233 lb 2 oz (105.7 kg)   LMP 08/28/2024   SpO2 99%   BMI 40.65 kg/m   Wt Readings from Last 3 Encounters:  09/23/24 233 lb 2 oz (105.7 kg)  08/04/24 235 lb (106.6 kg)  06/04/24 242 lb 4 oz (109.9 kg)      Physical Exam Vitals and nursing note reviewed.  Constitutional:      General: She is not in acute distress.    Appearance: Normal appearance. She is well-developed. She is not ill-appearing or toxic-appearing.  HENT:     Head: Normocephalic.     Right Ear: Hearing, tympanic membrane, ear canal and external ear normal.     Left Ear: Hearing, tympanic membrane, ear canal and external ear normal.     Nose: Nose normal.  Eyes:     General: Lids are normal. Lids are everted, no foreign bodies appreciated.     Conjunctiva/sclera: Conjunctivae normal.     Pupils: Pupils are equal, round, and reactive to light.  Neck:     Thyroid : No thyroid  mass or thyromegaly.     Vascular: No carotid bruit.     Trachea: Trachea normal.  Cardiovascular:     Rate and Rhythm: Normal rate and regular rhythm.     Heart sounds: Normal heart sounds, S1 normal and S2 normal. No murmur heard.    No gallop.  Pulmonary:     Effort: Pulmonary effort is normal. No respiratory distress.     Breath sounds: Normal breath sounds. No wheezing, rhonchi or rales.  Abdominal:     General: Bowel sounds are normal. There is no distension or abdominal bruit.     Palpations: Abdomen is soft. There is no fluid wave or mass.     Tenderness: There is no abdominal tenderness. There is no guarding or rebound.     Hernia: No hernia is present.  Musculoskeletal:     Cervical back: Normal range of motion and neck supple.  Lymphadenopathy:      Cervical: No cervical adenopathy.  Skin:    General: Skin is warm and dry.     Findings: No rash.  Neurological:     Mental Status: She is alert.     Cranial Nerves: No cranial nerve deficit.     Sensory: No sensory  deficit.  Psychiatric:        Mood and Affect: Mood is not anxious or depressed.        Speech: Speech normal.        Behavior: Behavior normal. Behavior is cooperative.        Judgment: Judgment normal.       Results for orders placed or performed in visit on 06/04/24  VITAMIN D  25 Hydroxy (Vit-D Deficiency, Fractures)   Collection Time: 06/04/24  3:23 PM  Result Value Ref Range   Vit D, 25-Hydroxy 61 30 - 100 ng/mL     COVID 19 screen:  No recent travel or known exposure to COVID19 The patient denies respiratory symptoms of COVID 19 at this time. The importance of social distancing was discussed today.   Assessment and Plan   The patient's preventative maintenance and recommended screening tests for an annual wellness exam were reviewed in full today. Brought up to date unless services declined.  Counselled on the importance of diet, exercise, and its role in overall health and mortality. The patient's FH and SH was reviewed, including their home life, tobacco status, and drug and alcohol status.   Vaccines: Last Tdap in  2023, COVID x 2, refused flu shot... will get a pharm. STD screen: refused Colon: no early family history.SABRASABRA9/15/2023 Mammogram: 5-06/2022, due scheduled to see gynecology November 25 PAP/DVE:  nml pap, HPV neg GYN 08/2022  No ETOH, no smoking  Hep C done  Problem List Items Addressed This Visit     Class 2 severe obesity due to excess calories with serious comorbidity and body mass index (BMI) of 35.0 to 35.9 in adult   Chronic, improving She is continuing to do wonderfully with lifestyle changes and weight management she continues to be followed by Dr. Prentiss       Relevant Medications   MOUNJARO  15 MG/0.5ML Pen   Other Relevant  Orders   Lipid panel   Hemoglobin A1c   Comprehensive metabolic panel with GFR   Essential hypertension, benign   Stable, chronic.  Continue current medication.  Spironolactone  50 mg BID      Relevant Orders   Lipid panel   Hemoglobin A1c   Comprehensive metabolic panel with GFR   Generalized anxiety disorder   Chronic,   Tolerable control on Pristiq 25 mg daily, Lamictal 25 mg daily, gabapentin 100 mg daily Using Ambien  10 mg at bedtime as needed  using clonazepam  1-2 tabs daily.       Relevant Medications   PRISTIQ 25 MG 24 hr tablet   High triglycerides   Due for reevaluation      Other fatigue   Relevant Orders   Iron, TIBC and Ferritin Panel   TSH   T4, free   T3, free   Vitamin B12   Folate   Magnesium   Zinc    Copper , Serum   Thyroid  peroxidase antibody   C-reactive protein   Sedimentation rate   T3, reverse   Vitamin D  deficiency   Resolved at last check      Relevant Orders   VITAMIN D  25 Hydroxy (Vit-D Deficiency, Fractures)   Other Visit Diagnoses       Routine general medical examination at a health care facility    -  Primary     Cold intolerance       Relevant Orders   TSH   T4, free   T3, free   Thyroid  peroxidase antibody   T3, reverse  Greig Ring, MD

## 2024-09-23 NOTE — Assessment & Plan Note (Signed)
 Stable, chronic.  Continue current medication.  Spironolactone  50 mg BID

## 2024-09-23 NOTE — Assessment & Plan Note (Addendum)
 Chronic, improving She is continuing to do wonderfully with lifestyle changes and weight management she continues to be followed by Dr. Prentiss

## 2024-09-23 NOTE — Assessment & Plan Note (Addendum)
 Chronic,   Tolerable control on Pristiq 25 mg daily, Lamictal 25 mg daily, gabapentin 100 mg daily Using Ambien  10 mg at bedtime as needed  using clonazepam  1-2 tabs daily.

## 2024-09-24 ENCOUNTER — Ambulatory Visit: Payer: Self-pay | Admitting: Family Medicine

## 2024-09-28 ENCOUNTER — Ambulatory Visit: Admitting: Clinical

## 2024-09-28 DIAGNOSIS — F411 Generalized anxiety disorder: Secondary | ICD-10-CM | POA: Diagnosis not present

## 2024-09-28 DIAGNOSIS — F331 Major depressive disorder, recurrent, moderate: Secondary | ICD-10-CM

## 2024-09-28 DIAGNOSIS — F902 Attention-deficit hyperactivity disorder, combined type: Secondary | ICD-10-CM

## 2024-09-28 LAB — COPPER, SERUM: Copper: 131 ug/dL (ref 70–175)

## 2024-09-28 LAB — IRON,TIBC AND FERRITIN PANEL
%SAT: 16 % (ref 16–45)
Ferritin: 26 ng/mL (ref 16–232)
Iron: 60 ug/dL (ref 40–190)
TIBC: 375 ug/dL (ref 250–450)

## 2024-09-28 LAB — ZINC: Zinc: 62 ug/dL (ref 60–130)

## 2024-09-28 LAB — T3, REVERSE: T3, Reverse: 15 ng/dL (ref 8–25)

## 2024-09-28 LAB — THYROID PEROXIDASE ANTIBODY: Thyroperoxidase Ab SerPl-aCnc: <1 [IU]/mL
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
## 2024-09-30 ENCOUNTER — Ambulatory Visit: Admitting: Clinical

## 2024-10-07 ENCOUNTER — Ambulatory Visit: Admitting: Clinical

## 2024-10-07 DIAGNOSIS — F902 Attention-deficit hyperactivity disorder, combined type: Secondary | ICD-10-CM | POA: Diagnosis not present

## 2024-10-07 DIAGNOSIS — F411 Generalized anxiety disorder: Secondary | ICD-10-CM | POA: Diagnosis not present

## 2024-10-07 DIAGNOSIS — F331 Major depressive disorder, recurrent, moderate: Secondary | ICD-10-CM | POA: Diagnosis not present
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
## 2024-10-11 ENCOUNTER — Other Ambulatory Visit: Payer: Self-pay | Admitting: Medical Genetics

## 2024-10-11 DIAGNOSIS — Z006 Encounter for examination for normal comparison and control in clinical research program: Secondary | ICD-10-CM

## 2024-10-11 NOTE — Progress Notes (Signed)
 Andrea Colon

## 2024-10-12 ENCOUNTER — Ambulatory Visit: Admitting: Clinical

## 2024-10-12 DIAGNOSIS — F902 Attention-deficit hyperactivity disorder, combined type: Secondary | ICD-10-CM | POA: Diagnosis not present

## 2024-10-12 DIAGNOSIS — F411 Generalized anxiety disorder: Secondary | ICD-10-CM | POA: Diagnosis not present

## 2024-10-12 DIAGNOSIS — F331 Major depressive disorder, recurrent, moderate: Secondary | ICD-10-CM | POA: Diagnosis not present
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
## 2024-10-13 ENCOUNTER — Other Ambulatory Visit: Payer: Self-pay | Admitting: Family Medicine

## 2024-10-13 DIAGNOSIS — F411 Generalized anxiety disorder: Secondary | ICD-10-CM

## 2024-10-13 NOTE — Telephone Encounter (Signed)
 Last office visit 09/23/24 for CPE.  Last refilled clonazepam  09/03/24 for #35 with no refills.  Zolpidem  08/11/2024 for #42 with 1 refill.  Next appt: No future appointments with PCP.

## 2024-10-14 ENCOUNTER — Ambulatory Visit: Admitting: Clinical

## 2024-10-21 ENCOUNTER — Ambulatory Visit: Admitting: Clinical

## 2024-10-21 DIAGNOSIS — F411 Generalized anxiety disorder: Secondary | ICD-10-CM | POA: Diagnosis not present

## 2024-10-21 DIAGNOSIS — F331 Major depressive disorder, recurrent, moderate: Secondary | ICD-10-CM

## 2024-10-21 DIAGNOSIS — F902 Attention-deficit hyperactivity disorder, combined type: Secondary | ICD-10-CM
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
## 2024-10-28 ENCOUNTER — Ambulatory Visit: Admitting: Clinical

## 2024-10-28 DIAGNOSIS — F902 Attention-deficit hyperactivity disorder, combined type: Secondary | ICD-10-CM

## 2024-10-28 DIAGNOSIS — F331 Major depressive disorder, recurrent, moderate: Secondary | ICD-10-CM | POA: Diagnosis not present

## 2024-10-28 DIAGNOSIS — F411 Generalized anxiety disorder: Secondary | ICD-10-CM | POA: Diagnosis not present
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
## 2024-11-03 ENCOUNTER — Ambulatory Visit: Admitting: Clinical

## 2024-11-03 DIAGNOSIS — F902 Attention-deficit hyperactivity disorder, combined type: Secondary | ICD-10-CM | POA: Diagnosis not present

## 2024-11-03 DIAGNOSIS — F411 Generalized anxiety disorder: Secondary | ICD-10-CM

## 2024-11-03 DIAGNOSIS — F331 Major depressive disorder, recurrent, moderate: Secondary | ICD-10-CM | POA: Diagnosis not present
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
## 2024-11-04 ENCOUNTER — Ambulatory Visit: Admitting: Clinical

## 2024-11-10 ENCOUNTER — Ambulatory Visit: Admitting: Clinical

## 2024-11-10 DIAGNOSIS — F411 Generalized anxiety disorder: Secondary | ICD-10-CM

## 2024-11-10 DIAGNOSIS — F331 Major depressive disorder, recurrent, moderate: Secondary | ICD-10-CM

## 2024-11-10 DIAGNOSIS — F902 Attention-deficit hyperactivity disorder, combined type: Secondary | ICD-10-CM | POA: Diagnosis not present
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
## 2024-11-11 ENCOUNTER — Ambulatory Visit: Admitting: Clinical

## 2024-11-14 ENCOUNTER — Other Ambulatory Visit: Payer: Self-pay | Admitting: Family Medicine

## 2024-11-14 DIAGNOSIS — F411 Generalized anxiety disorder: Secondary | ICD-10-CM

## 2024-11-15 LAB — GENECONNECT MOLECULAR SCREEN: Genetic Analysis Overall Interpretation: NEGATIVE

## 2024-11-15 NOTE — Telephone Encounter (Signed)
 Last office visit 09/23/24 for CPE.  Last refilled clonazepam  10/13/24 for #35 with no refills.  Zolpidem  10/13/24 for #30 with no refills.  Next Appt: No future appointments with PCP.

## 2024-11-16 ENCOUNTER — Encounter: Payer: Self-pay | Admitting: Family Medicine

## 2024-11-16 ENCOUNTER — Ambulatory Visit (INDEPENDENT_AMBULATORY_CARE_PROVIDER_SITE_OTHER): Admitting: Family Medicine

## 2024-11-16 VITALS — BP 124/82 | HR 102 | Ht 63.0 in | Wt 230.4 lb

## 2024-11-16 DIAGNOSIS — F5104 Psychophysiologic insomnia: Secondary | ICD-10-CM

## 2024-11-16 DIAGNOSIS — R790 Abnormal level of blood mineral: Secondary | ICD-10-CM

## 2024-11-16 DIAGNOSIS — G4733 Obstructive sleep apnea (adult) (pediatric): Secondary | ICD-10-CM

## 2024-11-16 DIAGNOSIS — Z6841 Body Mass Index (BMI) 40.0 and over, adult: Secondary | ICD-10-CM

## 2024-11-16 DIAGNOSIS — E785 Hyperlipidemia, unspecified: Secondary | ICD-10-CM

## 2024-11-16 DIAGNOSIS — N959 Unspecified menopausal and perimenopausal disorder: Secondary | ICD-10-CM

## 2024-11-16 DIAGNOSIS — F411 Generalized anxiety disorder: Secondary | ICD-10-CM

## 2024-11-16 DIAGNOSIS — F902 Attention-deficit hyperactivity disorder, combined type: Secondary | ICD-10-CM

## 2024-11-16 DIAGNOSIS — F331 Major depressive disorder, recurrent, moderate: Secondary | ICD-10-CM

## 2024-11-16 DIAGNOSIS — L700 Acne vulgaris: Secondary | ICD-10-CM

## 2024-11-16 DIAGNOSIS — E559 Vitamin D deficiency, unspecified: Secondary | ICD-10-CM

## 2024-11-16 DIAGNOSIS — E66813 Obesity, class 3: Secondary | ICD-10-CM

## 2024-11-16 DIAGNOSIS — R5382 Chronic fatigue, unspecified: Secondary | ICD-10-CM

## 2024-11-16 DIAGNOSIS — R1013 Epigastric pain: Secondary | ICD-10-CM

## 2024-11-17 DIAGNOSIS — F902 Attention-deficit hyperactivity disorder, combined type: Secondary | ICD-10-CM

## 2024-11-17 DIAGNOSIS — F331 Major depressive disorder, recurrent, moderate: Secondary | ICD-10-CM

## 2024-11-17 DIAGNOSIS — K121 Other forms of stomatitis: Secondary | ICD-10-CM

## 2024-11-17 DIAGNOSIS — L700 Acne vulgaris: Secondary | ICD-10-CM

## 2024-11-20 ENCOUNTER — Encounter: Payer: Self-pay | Admitting: Family Medicine

## 2024-11-20 DIAGNOSIS — R790 Abnormal level of blood mineral: Secondary | ICD-10-CM | POA: Insufficient documentation

## 2024-11-20 DIAGNOSIS — N959 Unspecified menopausal and perimenopausal disorder: Secondary | ICD-10-CM | POA: Insufficient documentation

## 2024-11-20 DIAGNOSIS — F902 Attention-deficit hyperactivity disorder, combined type: Secondary | ICD-10-CM | POA: Insufficient documentation

## 2024-11-20 DIAGNOSIS — F331 Major depressive disorder, recurrent, moderate: Secondary | ICD-10-CM | POA: Insufficient documentation

## 2024-11-20 DIAGNOSIS — E1169 Type 2 diabetes mellitus with other specified complication: Secondary | ICD-10-CM | POA: Insufficient documentation

## 2024-11-20 MED ORDER — CLONAZEPAM 0.5 MG PO TABS: 0.5000 mg | ORAL_TABLET | Freq: Every day | ORAL | 0 refills | Status: AC | PRN

## 2024-11-20 MED ORDER — LISDEXAMFETAMINE DIMESYLATE 50 MG PO CHEW: 50.0000 mg | CHEWABLE_TABLET | Freq: Every day | ORAL | 0 refills | Status: DC

## 2024-11-20 MED ORDER — ESTRADIOL 0.0375 MG/24HR TD PTTW: 1.0000 | MEDICATED_PATCH | TRANSDERMAL | 12 refills | Status: DC

## 2024-11-20 MED ORDER — TRETINOIN MICROSPHERE 0.1 % EX GEL: Freq: Every day | CUTANEOUS | 1 refills | Status: DC

## 2024-11-20 MED ORDER — ZOLPIDEM TARTRATE 10 MG PO TABS: 10.0000 mg | ORAL_TABLET | Freq: Every evening | ORAL | 0 refills | Status: AC | PRN

## 2024-11-20 NOTE — Addendum Note (Signed)
 Addended by: PRENTISS FRIEZE R on: 11/20/2024 07:41 PM   Modules accepted: Orders

## 2024-11-20 NOTE — Progress Notes (Addendum)
 Assessment & Plan:   1. Perimenopausal disorder Continue current menopausal hormone regimen with estradiol  0.05 mg patch twice weekly, nightly micronized progesterone , and vaginal Estrace  for GSM. Androgen panel shows low-normal total testosterone with normal free T and SHBG, consistent with reduced libido. She is currently trialing low-dose transdermal testosterone; continue this trial with monitoring for symptom improvement and adverse effects (acne, hair growth, mood changes), adjusting dose based on clinical response.  2. Hx of IR, PreDM Medication: Mounjaro . Issues reviewed: blood sugar goals, complications of diabetes mellitus, hypoglycemia prevention and treatment, exercise, and nutrition. Note: Correction - no Hx of DM.   Plan: Well controlled.  No signs of complications, medication side effects, or red flags.  Continue current regimen.    Lab Results  Component Value Date   HGBA1C 5.5 09/23/2024   HGBA1C 5.3 06/13/2023   HGBA1C 5.3 06/11/2022   Lab Results  Component Value Date   LDLCALC 107 (H) 09/23/2024   CREATININE 0.77 09/23/2024   3. Attention deficit hyperactivity disorder (ADHD), combined type (Primary) Comments: Currently taking Adderall from previous Rx. Interested in trying Vyvanse  again.  4. MDD (major depressive disorder), recurrent episode, moderate (HCC) Stable. Currently taking Pristiq, but would like to slowly wean. Okay to do very slowly with precautions. Continue counseling.   5. GAD (generalized anxiety disorder) See MDD. Long term use of Klonopin  without increasing dose or early requests. Okay to take over.   6. Vitamin D  deficiency, 24.54 on 09/23/24 Continue supplementation.   7. Low ferritin, 26 on 09/23/24 Scheduling IV iron infusions.   8. Dyslipidemia, HDL 30, LDL 107 on 09/23/24; ASCVD 7.6% No statin at this time.   9. Chronic fatigue Recently started LDN. Feels that it is helping.  10. Class 3 severe obesity with serious comorbidity and  body mass index (BMI) of 40.0 to 44.9 in adult, unspecified obesity type (HCC) Comments: Actively losing weight. Microdoing Mounjaro  (Dx DM) due to poor tolerability.  [name], DO, MS, FAAFP, Dipl. [facility] [address] [place], [address] Dept: [phone] Dept Fax: [phone]  Subjective:   Patient is well-known to me from previous care setting and is establishing care in this system with me as PCP. Prior records reviewed when available. Chart updated today with reconciliation of problem list, medications, allergies, and relevant history. Preventive care and chronic disease status reviewed. Portions of historical chart may remain incomplete; will update on an ongoing basis as clinically indicated.  Review of Systems: Negative, with the exception of above mentioned in HPI.  Current Outpatient Medications:    estradiol  (VIVELLE -DOT) 0.05 MG/24HR patch, Place 1 patch onto the skin 2 (two) times a week., Disp: , Rfl:    MOUNJARO  15 MG/0.5ML Pen, Inject 1.8 mg into the skin once a week., Disp: , Rfl:    NALTREXONE HCL PO, Take 0.5 mg by mouth daily. 1.5mg  daily (3 capsules), Disp: , Rfl:    PRISTIQ 25 MG 24 hr tablet, Take 25 mg by mouth daily., Disp: , Rfl:    progesterone  (PROMETRIUM ) 100 MG capsule, Take 100 mg by mouth daily., Disp: , Rfl:    spironolactone  (ALDACTONE ) 50 MG tablet, Take 1 tablet (50 mg total) by mouth 2 (two) times daily., Disp: 180 tablet, Rfl: 1   testosterone cypionate (DEPOTESTOSTERONE CYPIONATE) 200 MG/ML injection, Inject 4 mg into the muscle every other day., Disp: , Rfl:    tretinoin  microspheres (RETIN-A  MICRO) 0.1 % gel, Apply topically at bedtime., Disp: 45 g,  Rfl: 1   Cholecalciferol 100 MCG (4000 UT) CAPS, Take 1 capsule by mouth daily. (Patient not taking: Reported on 11/16/2024), Disp: , Rfl:    clonazePAM  (KLONOPIN ) 0.5 MG tablet, TAKE 1 TO 2 TABLETS BY MOUTH DAILY AS NEEDED FOR ANXIETY, Disp:  35 tablet, Rfl: 0   gabapentin (NEURONTIN) 100 MG capsule, Take 100 mg by mouth daily. (Patient not taking: Reported on 11/16/2024), Disp: , Rfl:    zolpidem  (AMBIEN ) 10 MG tablet, TAKE 1 TABLET BY MOUTH EVERY NIGHT AT BEDTIME AS NEEDED FOR SLEEP, Disp: 30 tablet, Rfl: 0   Objective:   BP 124/82 (BP Location: Right Arm, Cuff Size: Large)   Pulse (!) 102   Ht 5' 3 (1.6 m)   Wt 230 lb 6.4 oz (104.5 kg)   SpO2 98%   BMI 40.81 kg/m   Wt Readings from Last 3 Encounters:  11/16/24 230 lb 6.4 oz (104.5 kg)  09/23/24 233 lb 2 oz (105.7 kg)  08/04/24 235 lb (106.6 kg)   Physical Exam Vitals reviewed.  Constitutional:      General: She is not in acute distress.    Appearance: She is well-developed.  HENT:     Head: Normocephalic and atraumatic.  Eyes:     Conjunctiva/sclera: Conjunctivae normal.     Pupils: Pupils are equal, round, and reactive to light.  Cardiovascular:     Rate and Rhythm: Normal rate and regular rhythm.     Heart sounds: Normal heart sounds.  Pulmonary:     Effort: Pulmonary effort is normal.     Breath sounds: Normal breath sounds.  Abdominal:     Palpations: Abdomen is soft.  Skin:    General: Skin is warm.  Neurological:     General: No focal deficit present.     Mental Status: She is alert and oriented to person, place, and time.  Psychiatric:        Mood and Affect: Mood normal.        Behavior: Behavior normal.    Lab Results  Component Value Date   CREATININE 0.77 09/23/2024   BUN 9 09/23/2024   NA 137 09/23/2024   K 4.2 09/23/2024   CL 100 09/23/2024   CO2 28 09/23/2024   Lab Results  Component Value Date   ALT 10 09/23/2024   AST 13 09/23/2024   ALKPHOS 40 09/23/2024   BILITOT 0.4 09/23/2024   Lab Results  Component Value Date   HGBA1C 5.5 09/23/2024   HGBA1C 5.3 06/13/2023   HGBA1C 5.3 06/11/2022   HGBA1C 6.1 05/22/2021   HGBA1C 6.3 02/08/2020   Lab Results  Component Value Date   INSULIN  56.1 (H) 05/31/2021   INSULIN  35.1  (H) 04/26/2020   Lab Results  Component Value Date   TSH 1.73 09/23/2024   Lab Results  Component Value Date   CHOL 163 09/23/2024   HDL 30.90 (L) 09/23/2024   LDLCALC 107 (H) 09/23/2024   LDLDIRECT 81.0 05/22/2021   TRIG 121.0 09/23/2024   CHOLHDL 5 09/23/2024   Lab Results  Component Value Date   VD25OH 24.54 (L) 09/23/2024   VD25OH 61 06/04/2024   VD25OH 26.85 (L) 06/13/2023   Lab Results  Component Value Date   WBC 9.8 04/22/2024   HGB 13.7 04/22/2024   HCT 39.7 04/22/2024   MCV 84.9 04/22/2024   PLT 355.0 04/22/2024   Lab Results  Component Value Date   IRON 60 09/23/2024   TIBC 375 09/23/2024   FERRITIN 26 09/23/2024  Addendum:  1. Attention deficit hyperactivity disorder (ADHD), combined type   2. Perimenopausal disorder   3. MDD (major depressive disorder), recurrent episode, moderate (HCC)   4. GAD (generalized anxiety disorder)   5. Vitamin D  deficiency, 24.54 on 09/23/24   6. Low ferritin, 26 on 09/23/24   7. Dyslipidemia (high LDL; low HDL)   8. Chronic fatigue   9. Generalized anxiety disorder   10. Psychophysiologic insomnia   11. Acne vulgaris   12. Epigastric pain   13. OSA on CPAP   14. Class 3 severe obesity with serious comorbidity and body mass index (BMI) of 40.0 to 44.9 in adult, unspecified obesity type (HCC)    Meds ordered this encounter  Medications   DISCONTD: Lisdexamfetamine Dimesylate  (VYVANSE ) 50 MG CHEW    Sig: Chew 1 tablet (50 mg total) by mouth daily.    Dispense:  30 tablet    Refill:  0   clonazePAM  (KLONOPIN ) 0.5 MG tablet    Sig: Take 1-2 tablets (0.5-1 mg total) by mouth daily as needed for anxiety.    Dispense:  60 tablet    Refill:  0   estradiol  (VIVELLE -DOT) 0.0375 MG/24HR    Sig: Place 1 patch onto the skin 2 (two) times a week.    Dispense:  8 patch    Refill:  12   zolpidem  (AMBIEN ) 10 MG tablet    Sig: Take 1 tablet (10 mg total) by mouth at bedtime as needed. for sleep    Dispense:  90 tablet     Refill:  0   tretinoin  microspheres (RETIN-A  MICRO) 0.1 % gel    Sig: Apply topically at bedtime.    Dispense:  45 g    Refill:  1   Orders Placed This Encounter  Procedures   Ambulatory referral to Gastroenterology

## 2024-11-20 NOTE — Assessment & Plan Note (Signed)
 Continue current menopausal hormone regimen with estradiol  0.05 mg patch twice weekly, nightly micronized progesterone , and vaginal Estrace  for GSM. Androgen panel shows low-normal total testosterone with normal free T and SHBG, consistent with reduced libido. She is currently trialing low-dose transdermal testosterone; continue this trial with monitoring for symptom improvement and adverse effects (acne, hair growth, mood changes), adjusting dose based on clinical response.

## 2024-11-21 ENCOUNTER — Other Ambulatory Visit: Payer: Self-pay | Admitting: Family Medicine

## 2024-11-22 MED ORDER — TRETINOIN MICROSPHERE 0.04 % EX GEL: Freq: Every day | CUTANEOUS | 0 refills | Status: DC

## 2024-11-22 NOTE — Telephone Encounter (Signed)
 Please see message from pt below. She would like to stay on the Adderall. It does not appear that any stimulants have been sent in yet. I am sending a message to pt to confirm what dose of Adderall she is taking. I have also found and added the rx for the mouth ulcers that she requested.

## 2024-11-22 NOTE — Addendum Note (Signed)
 Addended by: PRENTISS FRIEZE R on: 11/22/2024 08:08 AM   Modules accepted: Orders

## 2024-11-23 MED ORDER — PRISTIQ 25 MG PO TB24: 25.0000 mg | ORAL_TABLET | Freq: Every day | ORAL | 0 refills | Status: DC

## 2024-11-23 MED ORDER — AMPHETAMINE-DEXTROAMPHETAMINE 20 MG PO TABS: 20.0000 mg | ORAL_TABLET | Freq: Two times a day (BID) | ORAL | 0 refills | Status: DC

## 2024-11-23 MED ORDER — TRIAMCINOLONE ACETONIDE 0.1 % MT PSTE: 1.0000 | PASTE | Freq: Two times a day (BID) | OROMUCOSAL | 1 refills | Status: AC

## 2024-11-24 ENCOUNTER — Ambulatory Visit: Admitting: Clinical

## 2024-11-24 DIAGNOSIS — F331 Major depressive disorder, recurrent, moderate: Secondary | ICD-10-CM

## 2024-11-24 DIAGNOSIS — F902 Attention-deficit hyperactivity disorder, combined type: Secondary | ICD-10-CM

## 2024-11-24 DIAGNOSIS — F411 Generalized anxiety disorder: Secondary | ICD-10-CM

## 2024-11-24 NOTE — Telephone Encounter (Signed)
 I have called pts pharmacy to inquire about the lower dose Retin-A  rx that was sent in om 11/22/2024. The pharmacist that I spoke with, Olam, confirmed that they got the script but since pt had picked up a different strength Retin-A  formula day prior, they didn't think that pt needed the 0.04% that was sent in. I explained that the other formula was too strong for pts skin. She understood and told me that she would get the script processed, but that she would more than likely have to order the medication. I did confirm with Olam that it would still be covered by pts insurance. I called [facility] and gave her all the above information and let her know that they pharmacy would let her know when it is ready.
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
## 2024-12-01 ENCOUNTER — Encounter: Payer: Self-pay | Admitting: Family Medicine

## 2024-12-01 ENCOUNTER — Ambulatory Visit: Admitting: Family Medicine

## 2024-12-01 ENCOUNTER — Ambulatory Visit: Admitting: Clinical

## 2024-12-01 VITALS — BP 116/74 | HR 99 | Ht 63.0 in | Wt 229.6 lb

## 2024-12-01 DIAGNOSIS — F902 Attention-deficit hyperactivity disorder, combined type: Secondary | ICD-10-CM

## 2024-12-01 DIAGNOSIS — F331 Major depressive disorder, recurrent, moderate: Secondary | ICD-10-CM

## 2024-12-01 DIAGNOSIS — N959 Unspecified menopausal and perimenopausal disorder: Secondary | ICD-10-CM

## 2024-12-01 DIAGNOSIS — R21 Rash and other nonspecific skin eruption: Secondary | ICD-10-CM

## 2024-12-01 DIAGNOSIS — F900 Attention-deficit hyperactivity disorder, predominantly inattentive type: Secondary | ICD-10-CM

## 2024-12-01 DIAGNOSIS — F411 Generalized anxiety disorder: Secondary | ICD-10-CM

## 2024-12-01 DIAGNOSIS — Z79890 Hormone replacement therapy (postmenopausal): Secondary | ICD-10-CM
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
## 2024-12-01 NOTE — Progress Notes (Signed)
 Assessment & Plan:   No diagnosis found. No orders of the defined types were placed in this encounter.   Assessment and Plan Assessment & Plan Perimenopausal hormone management Dizziness, nausea, and mood changes possibly due to hormonal fluctuations. Rash resolved after stopping progesterone . Vestibular migraine considered.  - Continue estrogen and progesterone  at current doses for 1-2 months. - Monitor symptoms and report changes. - Stop testosterone.  Vestibular migraine Dizziness and nausea without typical migraine pain suggest vestibular migraine. Hormonal changes may trigger symptoms. - Monitor symptoms in context of hormonal changes. - Hydrate with electrolytes.  Drug-induced skin eruption Rash resolved after stopping progesterone  and did not recur upon restarting, suggesting progesterone  may not be the cause.  - Continue current hormone regimen.  Attention-deficit hyperactivity disorder - Limit Adderall to one dose per day, consider reducing dose if symptoms persist.  Depressive disorder Currently on Pristiq  25 mg. Concerns about medication interactions and side effects.  - Monitor mood and report changes.  Obesity Currently on Mounjaro . Experienced dizziness and nausea after dose adjustment. Discussed side effects and monitoring. - Monitor for side effects and adjust as needed.  [name], DO, MS, FAAFP, Dipl. [facility] [address] [place], [address] Dept: [phone] Dept Fax: [phone]  Subjective:   Discussed the use of AI scribe software for clinical note transcription with the patient, who gave verbal consent to proceed.  History of Present Illness Andrea Colon is a 48 year old female who presents with a persistent rash and dizziness.  Pruritic rash - Rash present for approximately nine days - Initial onset on legs, spreading to the backs of arms - Intensely pruritic without  urticaria - Rash resolved after discontinuation of progesterone  - No recurrence of rash after restarting progesterone  - Suspects possible hormone or histamine reaction  Dizziness and disequilibrium - Dizziness present for approximately two weeks - Describes sensation of head being 'stuffed with cotton', persistent off-balance feeling, and nausea - Carsick sensation without true vertigo - No significant headaches, tinnitus, or fever - Slight cough and nasal congestion present  Hormonal therapy adjustments - Menstrual-related hormone fluctuations - Frequent self-adjustment of hormone therapy, including stopping and restarting estrogen and progesterone  - Symptom changes associated with hormone therapy adjustments  Medication use and adverse effects - Current use of Adderall - Considered but did not switch to Vyvanse  - Uses vitamin C and magnesium - Antihistamines cause excessive somnolence   Review of Systems: Negative, with the exception of above mentioned in HPI. Current Medications[1]   Objective:   BP 116/74 (BP Location: Right Arm, Cuff Size: Large)   Pulse 99   Ht 5' 3 (1.6 m)   Wt 229 lb 9.6 oz (104.1 kg)   SpO2 97%   BMI 40.67 kg/m   Physical Exam Constitutional:      General: She is not in acute distress.    Appearance: She is well-developed.  HENT:     Head: Normocephalic and atraumatic.     Right Ear: Tympanic membrane normal.     Left Ear: Tympanic membrane normal.     Nose: Congestion and rhinorrhea present.  Eyes:     Conjunctiva/sclera: Conjunctivae normal.  Cardiovascular:     Rate and Rhythm: Normal rate and regular rhythm.     Heart sounds: Normal heart sounds.  Pulmonary:     Effort: Pulmonary effort is normal.     Breath sounds: Normal breath sounds.  Neurological:     General:  No focal deficit present.     Mental Status: She is alert.  Psychiatric:        Mood and Affect: Mood normal.        Behavior: Behavior normal.    Lab Results   Component Value Date   CREATININE 0.77 09/23/2024   BUN 9 09/23/2024   NA 137 09/23/2024   K 4.2 09/23/2024   CL 100 09/23/2024   CO2 28 09/23/2024   Lab Results  Component Value Date   ALT 10 09/23/2024   AST 13 09/23/2024   ALKPHOS 40 09/23/2024   BILITOT 0.4 09/23/2024   Lab Results  Component Value Date   HGBA1C 5.5 09/23/2024   HGBA1C 5.3 06/13/2023   HGBA1C 5.3 06/11/2022   HGBA1C 6.1 05/22/2021   HGBA1C 6.3 02/08/2020   Lab Results  Component Value Date   INSULIN  56.1 (H) 05/31/2021   INSULIN  35.1 (H) 04/26/2020   Lab Results  Component Value Date   TSH 1.73 09/23/2024   Lab Results  Component Value Date   CHOL 163 09/23/2024   HDL 30.90 (L) 09/23/2024   LDLCALC 107 (H) 09/23/2024   LDLDIRECT 81.0 05/22/2021   TRIG 121.0 09/23/2024   CHOLHDL 5 09/23/2024   Lab Results  Component Value Date   VD25OH 24.54 (L) 09/23/2024   VD25OH 61 06/04/2024   VD25OH 26.85 (L) 06/13/2023   Lab Results  Component Value Date   WBC 9.8 04/22/2024   HGB 13.7 04/22/2024   HCT 39.7 04/22/2024   MCV 84.9 04/22/2024   PLT 355.0 04/22/2024   Lab Results  Component Value Date   IRON 60 09/23/2024   TIBC 375 09/23/2024   FERRITIN 26 09/23/2024      [1]  Current Outpatient Medications:    amphetamine -dextroamphetamine  (ADDERALL) 20 MG tablet, Take 1 tablet (20 mg total) by mouth 2 (two) times daily., Disp: 60 tablet, Rfl: 0   clonazePAM  (KLONOPIN ) 0.5 MG tablet, Take 1-2 tablets (0.5-1 mg total) by mouth daily as needed for anxiety., Disp: 60 tablet, Rfl: 0   MOUNJARO  15 MG/0.5ML Pen, Inject 1.8 mg into the skin once a week., Disp: , Rfl:    NALTREXONE HCL PO, Take 0.5 mg by mouth daily. 1.5mg  daily (3 capsules), Disp: , Rfl:    PRISTIQ  25 MG 24 hr tablet, Take 1 tablet (25 mg total) by mouth daily., Disp: 90 tablet, Rfl: 0   progesterone  (PROMETRIUM ) 100 MG capsule, Take 100 mg by mouth daily., Disp: , Rfl:    spironolactone  (ALDACTONE ) 50 MG tablet, Take 1 tablet  (50 mg total) by mouth 2 (two) times daily., Disp: 180 tablet, Rfl: 1   tretinoin  microspheres (RETIN-A  MICRO) 0.04 % gel, Apply topically at bedtime., Disp: 45 g, Rfl: 0   triamcinolone  (KENALOG ) 0.1 % paste, Use as directed 1 Application in the mouth or throat 2 (two) times daily., Disp: 5 g, Rfl: 1   zolpidem  (AMBIEN ) 10 MG tablet, Take 1 tablet (10 mg total) by mouth at bedtime as needed. for sleep, Disp: 90 tablet, Rfl: 0   Cholecalciferol 100 MCG (4000 UT) CAPS, Take 1 capsule by mouth daily. (Patient not taking: Reported on 12/01/2024), Disp: , Rfl:    estradiol  (VIVELLE -DOT) 0.0375 MG/24HR, Place 1 patch onto the skin 2 (two) times a week. (Patient not taking: Reported on 12/01/2024), Disp: 8 patch, Rfl: 12   gabapentin (NEURONTIN) 100 MG capsule, Take 100 mg by mouth daily. (Patient not taking: Reported on 12/01/2024), Disp: , Rfl:  testosterone cypionate (DEPOTESTOSTERONE CYPIONATE) 200 MG/ML injection, Inject 4 mg into the muscle every other day. (Patient not taking: Reported on 12/01/2024), Disp: , Rfl:

## 2024-12-02 ENCOUNTER — Encounter: Payer: Self-pay | Admitting: Family Medicine

## 2024-12-02 DIAGNOSIS — E785 Hyperlipidemia, unspecified: Secondary | ICD-10-CM | POA: Insufficient documentation

## 2024-12-08 ENCOUNTER — Ambulatory Visit: Admitting: Clinical

## 2024-12-08 DIAGNOSIS — F331 Major depressive disorder, recurrent, moderate: Secondary | ICD-10-CM | POA: Diagnosis not present

## 2024-12-08 DIAGNOSIS — F411 Generalized anxiety disorder: Secondary | ICD-10-CM | POA: Diagnosis not present

## 2024-12-08 DIAGNOSIS — F902 Attention-deficit hyperactivity disorder, combined type: Secondary | ICD-10-CM
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
## 2024-12-09 DIAGNOSIS — I10 Essential (primary) hypertension: Secondary | ICD-10-CM

## 2024-12-09 NOTE — Telephone Encounter (Signed)
 Dr Prentiss, pt is requesting a refill on her Spironolactone  50mg . She mentions that this was originally prescribed by her dermatologist. I have loaded it to send the way she asked for it, 30 days at a time. Are you okay with refilling?

## 2024-12-10 MED ORDER — SPIRONOLACTONE 50 MG PO TABS: 50.0000 mg | ORAL_TABLET | Freq: Two times a day (BID) | ORAL | 2 refills | Status: AC

## 2024-12-19 ENCOUNTER — Other Ambulatory Visit: Payer: Self-pay | Admitting: Family Medicine

## 2024-12-19 DIAGNOSIS — L700 Acne vulgaris: Secondary | ICD-10-CM

## 2024-12-21 ENCOUNTER — Telehealth: Admitting: Physician Assistant

## 2024-12-21 DIAGNOSIS — B9689 Other specified bacterial agents as the cause of diseases classified elsewhere: Secondary | ICD-10-CM

## 2024-12-21 DIAGNOSIS — J019 Acute sinusitis, unspecified: Secondary | ICD-10-CM | POA: Diagnosis not present

## 2024-12-21 MED ORDER — AMOXICILLIN-POT CLAVULANATE 875-125 MG PO TABS: 1.0000 | ORAL_TABLET | Freq: Two times a day (BID) | ORAL | 0 refills | Status: DC

## 2024-12-21 MED ORDER — AZELASTINE HCL 0.1 % NA SOLN: 2.0000 | Freq: Two times a day (BID) | NASAL | 0 refills | Status: DC

## 2024-12-21 NOTE — Progress Notes (Signed)
 "    E-Visit for Sinus Problems  We are sorry that you are not feeling well.  Here is how we plan to help!  Based on what you have shared with me it looks like you have sinusitis.  Sinusitis is inflammation and infection in the sinus cavities of the head.  Based on your presentation I believe you most likely have Acute Bacterial Sinusitis.  This is an infection caused by bacteria and is treated with antibiotics. I have prescribed Augmentin  875mg /125mg  one tablet twice daily with food, for 7 days. and I have also prescribed Azelastine  Nasal Spray Use 1 spray in each nostril twice daily for 10-14 days   You may use an oral decongestant such as Mucinex  D or if you have glaucoma or high blood pressure use plain Mucinex .   Saline nasal spray help and can safely be used as often as needed for congestion.  Try using saline irrigation, such as with a neti pot, several times a day while you are sick. Many neti pots come with salt packets premeasured to use to make saline. If you use your own salt, make sure it is kosher salt or sea salt (don't use table salt as it has iodine in it and you don't need that in your nose). Use distilled water to make saline. If you mix your own saline using your own salt, the recipe is 1/4 teaspoon salt in 1 cup warm water. Using saline irrigation can help prevent and treat sinus infections.   If you develop worsening sinus pain, fever or notice severe headache and vision changes, or if symptoms are not better after completion of antibiotic, please schedule an appointment with a health care provider.    Sinus infections are not as easily transmitted as other respiratory infection, however we still recommend that you avoid close contact with loved ones, especially the very young and elderly.  Remember to wash your hands thoroughly throughout the day as this is the number one way to prevent the spread of infection!  Home Care: Only take medications as instructed by your medical  team. Complete the entire course of an antibiotic. Do not take these medications with alcohol. A steam or ultrasonic humidifier can help congestion.  You can place a towel over your head and breathe in the steam from hot water coming from a faucet. Avoid close contacts especially the very young and the elderly. Cover your mouth when you cough or sneeze. Always remember to wash your hands.  Get Help Right Away If: You develop worsening fever or sinus pain. You develop a severe head ache or visual changes. Your symptoms persist after you have completed your treatment plan.  Make sure you Understand these instructions. Will watch your condition. Will get help right away if you are not doing well or get worse.  Your e-visit answers were reviewed by a board certified advanced clinical practitioner to complete your personal care plan.  Depending on the condition, your plan could have included both over the counter or prescription medications.  If there is a problem please reply  once you have received a response from your provider.  Your safety is important to us .  If you have drug allergies check your prescription carefully.    You can use MyChart to ask questions about todays visit, request a non-urgent call back, or ask for a work or school excuse for 24 hours related to this e-Visit. If it has been greater than 24 hours you will need to  follow up with your provider, or enter a new e-Visit to address those concerns.  You will get an e-mail in the next two days asking about your experience.  I hope that your e-visit has been valuable and will speed your recovery. Thank you for using e-visits.  I have spent 5 minutes in review of e-visit questionnaire, review and updating patient chart, medical decision making and response to patient.   Jon Belt, PhD, FNP-BC       "

## 2024-12-22 ENCOUNTER — Other Ambulatory Visit: Payer: Self-pay | Admitting: Family Medicine

## 2024-12-22 MED ORDER — LISDEXAMFETAMINE DIMESYLATE 50 MG PO CAPS: 50.0000 mg | ORAL_CAPSULE | Freq: Every morning | ORAL | 0 refills | Status: AC

## 2024-12-28 ENCOUNTER — Ambulatory Visit: Admitting: Family Medicine

## 2024-12-29 ENCOUNTER — Ambulatory Visit: Admitting: Clinical

## 2024-12-29 ENCOUNTER — Ambulatory Visit: Admitting: Family Medicine

## 2024-12-29 DIAGNOSIS — F902 Attention-deficit hyperactivity disorder, combined type: Secondary | ICD-10-CM | POA: Diagnosis not present

## 2024-12-29 DIAGNOSIS — F331 Major depressive disorder, recurrent, moderate: Secondary | ICD-10-CM

## 2024-12-29 DIAGNOSIS — F411 Generalized anxiety disorder: Secondary | ICD-10-CM | POA: Diagnosis not present
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
## 2024-12-31 ENCOUNTER — Ambulatory Visit: Admitting: Clinical

## 2025-01-04 ENCOUNTER — Encounter: Payer: Self-pay | Admitting: Gastroenterology

## 2025-01-05 ENCOUNTER — Ambulatory Visit: Admitting: Clinical

## 2025-01-05 DIAGNOSIS — F902 Attention-deficit hyperactivity disorder, combined type: Secondary | ICD-10-CM

## 2025-01-05 DIAGNOSIS — F411 Generalized anxiety disorder: Secondary | ICD-10-CM

## 2025-01-05 DIAGNOSIS — F331 Major depressive disorder, recurrent, moderate: Secondary | ICD-10-CM
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
## 2025-01-11 NOTE — Progress Notes (Unsigned)
 "    Patient Care Team: Prentiss Frieze, DO as PCP - General (Family Medicine) Tenny Andriette CROME, PhD (Psychology)  Assessment & Plan:   Perimenopausal disorder  Hormone replacement therapy (HRT)  Attention deficit hyperactivity disorder (ADHD), predominantly inattentive type  GAD (generalized anxiety disorder)  Psychophysiologic insomnia  Class 3 severe obesity with serious comorbidity and body mass index (BMI) of 40.0 to 44.9 in adult, unspecified obesity type (HCC)   Frieze Prentiss, DO, MS (Nutrition), FAAFP, Dipl. ABOM Fellow, American Academy of Family Physicians Diplomate, American Board of Obesity Medicine [facility] at [facility] [address] [place], [place] [address] Dept: [phone] Fax: [phone]  Subjective:   Review of Systems: Negative, with the exception of above mentioned in HPI.  History:   Reviewed by clinician on day of visit: allergies, medications, problem list, medical history, surgical history, family history, social history, and previous encounter notes.  Medications:   Show/hide medication list[1] Allergies[2]  Objective:   BP 124/76   Pulse (!) 104   Ht 5' 3 (1.6 m)   Wt 231 lb (104.8 kg)   SpO2 96%   BMI 40.92 kg/m   Physical Exam Constitutional:      General: She is not in acute distress.    Appearance: She is well-developed.  HENT:     Head: Normocephalic and atraumatic.  Eyes:     Conjunctiva/sclera: Conjunctivae normal.  Cardiovascular:     Rate and Rhythm: Normal rate and regular rhythm.     Heart sounds: Normal heart sounds.  Pulmonary:     Effort: Pulmonary effort is normal.     Breath sounds: Normal breath sounds.  Neurological:     General: No focal deficit present.     Mental Status: She is alert.  Psychiatric:        Behavior: Behavior normal.    Results for orders placed or performed in visit on 10/11/24  GeneConnect Molecular Screen   Collection Time:  11/08/24  6:25 PM  Result Value Ref Range   Genetic Analysis Overall Interpretation Negative    Genetic Disease Assessed      This is a screening test and does not detect all pathogenic or likely pathogenic variant(s) in the tested genes; diagnostic testing is recommended for individuals with a personal or family history of heart disease or hereditary cancer. Hel[employer] Tier One  Population Screen is a screening test that analyzes 11 genes related to hereditary breast and ovarian cancer (HBOC) syndrome, Lynch syndrome, and familial hypercholesterolemia. This test only reports clinically significant pathogenic and likely  pathogenic variants but does not report variants of uncertain significance (VUS). In addition, analysis of the PMS2 gene excludes exons 11-15, which overlap with a known pseudogene (PMS2CL).    Genetic Analysis Report      No pathogenic or likely pathogenic variants were detected in the genes analyzed by this test.Genetic test results should be interpreted in the context of an individual's personal medical and family history. Alteration to medical management is NOT  recommended based solely on this result. Clinical correlation is advised.Additional Considerations- This is a screening test; individuals may still carry pathogenic or likely pathogenic variant(s) in the tested genes that are not detected by this test.-  For individuals at risk for these or other related conditions based on factors including personal or family history, diagnostic testing is recommended.- The absence of pathogenic or likely pathogenic variant(s) in the analyzed genes, while reassuring,  does not eliminate the possibility of  a hereditary condition; there are other variants and genes associated with heart disease and hereditary cancer that are not included in this test.    Genes Tested See Notes    Disclaimer See Notes    Sequencing Location See Notes    Interpretation Methods and Limitations See Notes      Attestations:   As the patient's PCP, board-certified in Family Medicine and Obesity Medicine, I am providing ongoing, guideline-directed obesity management utilizing all pillars of care, including lifestyle intervention and FDA-approved anti-obesity pharmacotherapy.  [name], DO, MS (Nutrition), FAAFP, Dipl. ABOM Fellow, American Academy of Family Physicians Diplomate, American Board of Obesity Medicine [facility] at [facility] [address] [place], [place] [address] Dept: [phone] Fax: [phone]     [1]  Outpatient Medications Prior to Visit  Medication Sig   clonazePAM  (KLONOPIN ) 0.5 MG tablet Take 1-2 tablets (0.5-1 mg total) by mouth daily as needed for anxiety.   gabapentin (NEURONTIN) 100 MG capsule Take 100 mg by mouth 1 day or 1 dose. At night   lamoTRIgine (LAMICTAL) 25 MG tablet Take 50 mg by mouth daily.   lisdexamfetamine  (VYVANSE ) 50 MG capsule Take 1 capsule (50 mg total) by mouth in the morning. (Patient taking differently: Take 50 mg by mouth as needed.)   progesterone  (PROMETRIUM ) 100 MG capsule Take 100 mg by mouth daily.   spironolactone  (ALDACTONE ) 50 MG tablet Take 1 tablet (50 mg total) by mouth 2 (two) times daily.   tretinoin  microspheres (RETIN-A  MICRO) 0.04 % gel APPLY TO AFFECTED AREA(S) AT BEDTIME   triamcinolone  (KENALOG ) 0.1 % paste Use as directed 1 Application in the mouth or throat 2 (two) times daily. (Patient taking differently: Use as directed 1 Application in the mouth or throat as needed.)   zolpidem  (AMBIEN ) 10 MG tablet Take 1 tablet (10 mg total) by mouth at bedtime as needed. for sleep   [DISCONTINUED] estradiol  (VIVELLE -DOT) 0.0375 MG/24HR Place 1 patch onto the skin 2 (two) times a week.   [DISCONTINUED] amoxicillin -clavulanate (AUGMENTIN ) 875-125 MG tablet Take 1 tablet by mouth 2 (two) times daily. (Patient not taking: Reported on 01/12/2025)   [DISCONTINUED] azelastine  (ASTELIN ) 0.1 %  nasal spray Place 2 sprays into both nostrils 2 (two) times daily. Use in each nostril as directed (Patient not taking: Reported on 01/12/2025)   [DISCONTINUED] MOUNJARO  15 MG/0.5ML Pen Inject 1.8 mg into the skin once a week. (Patient not taking: Reported on 01/12/2025)   [DISCONTINUED] NALTREXONE HCL PO Take 0.5 mg by mouth daily. 1.5mg  daily (3 capsules) (Patient not taking: Reported on 01/12/2025)   [DISCONTINUED] PRISTIQ  25 MG 24 hr tablet Take 1 tablet (25 mg total) by mouth daily. (Patient not taking: Reported on 01/12/2025)   No facility-administered medications prior to visit.  [2]  Allergies Allergen Reactions   Sulfa Antibiotics Hives   "

## 2025-01-12 ENCOUNTER — Ambulatory Visit: Admitting: Clinical

## 2025-01-12 ENCOUNTER — Ambulatory Visit: Admitting: Family Medicine

## 2025-01-12 VITALS — BP 124/76 | HR 104 | Ht 63.0 in | Wt 231.0 lb

## 2025-01-12 DIAGNOSIS — F902 Attention-deficit hyperactivity disorder, combined type: Secondary | ICD-10-CM

## 2025-01-12 DIAGNOSIS — Z79890 Hormone replacement therapy (postmenopausal): Secondary | ICD-10-CM

## 2025-01-12 DIAGNOSIS — F411 Generalized anxiety disorder: Secondary | ICD-10-CM

## 2025-01-12 DIAGNOSIS — E66813 Obesity, class 3: Secondary | ICD-10-CM

## 2025-01-12 DIAGNOSIS — N959 Unspecified menopausal and perimenopausal disorder: Secondary | ICD-10-CM

## 2025-01-12 DIAGNOSIS — F5104 Psychophysiologic insomnia: Secondary | ICD-10-CM

## 2025-01-12 DIAGNOSIS — F900 Attention-deficit hyperactivity disorder, predominantly inattentive type: Secondary | ICD-10-CM

## 2025-01-12 DIAGNOSIS — F331 Major depressive disorder, recurrent, moderate: Secondary | ICD-10-CM | POA: Diagnosis not present
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
## 2025-01-14 ENCOUNTER — Ambulatory Visit: Admitting: Family Medicine

## 2025-01-19 ENCOUNTER — Ambulatory Visit: Admitting: Clinical

## 2025-01-19 MED ORDER — ESTRADIOL 0.05 MG/24HR TD PTTW: 1.0000 | MEDICATED_PATCH | TRANSDERMAL | 12 refills | Status: AC

## 2025-01-21 ENCOUNTER — Ambulatory Visit: Admitting: Family Medicine

## 2025-01-24 ENCOUNTER — Encounter: Payer: Self-pay | Admitting: Family Medicine

## 2025-01-24 NOTE — Assessment & Plan Note (Signed)
 Andrea Colon has been working to control hormonal surges. We discussed the importance of taking medications consistently. Continue estrogen patch and progesterone .

## 2025-01-26 ENCOUNTER — Ambulatory Visit: Admitting: Clinical

## 2025-01-28 ENCOUNTER — Ambulatory Visit: Admitting: Clinical

## 2025-01-28 DIAGNOSIS — F411 Generalized anxiety disorder: Secondary | ICD-10-CM

## 2025-01-28 DIAGNOSIS — F902 Attention-deficit hyperactivity disorder, combined type: Secondary | ICD-10-CM

## 2025-01-28 DIAGNOSIS — F331 Major depressive disorder, recurrent, moderate: Secondary | ICD-10-CM
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
## 2025-01-31 ENCOUNTER — Ambulatory Visit: Admitting: Gastroenterology

## 2025-02-02 ENCOUNTER — Ambulatory Visit: Admitting: Clinical

## 2025-02-08 ENCOUNTER — Ambulatory Visit: Admitting: Family Medicine

## 2025-02-09 ENCOUNTER — Ambulatory Visit: Admitting: Clinical

## 2025-02-16 ENCOUNTER — Ambulatory Visit: Admitting: Clinical

## 2025-02-23 ENCOUNTER — Ambulatory Visit: Admitting: Clinical

## 2025-03-09 ENCOUNTER — Ambulatory Visit: Admitting: Clinical

## 2025-03-16 ENCOUNTER — Ambulatory Visit: Admitting: Clinical
